# Patient Record
Sex: Female | Born: 1970
Health system: Southern US, Community
[De-identification: ages and names within clinical notes are randomized; demographics above are authoritative.]

## PROBLEM LIST (undated history)

## (undated) VITALS — BP 103/71 | HR 86 | Temp 97.6°F | Resp 20 | Ht 67.0 in | Wt 160.0 lb

## (undated) DIAGNOSIS — G43019 Migraine without aura, intractable, without status migrainosus: Secondary | ICD-10-CM

## (undated) DIAGNOSIS — F329 Major depressive disorder, single episode, unspecified: Secondary | ICD-10-CM

## (undated) DIAGNOSIS — H532 Diplopia: Secondary | ICD-10-CM

## (undated) DIAGNOSIS — G930 Cerebral cysts: Secondary | ICD-10-CM

## (undated) DIAGNOSIS — I700 Atherosclerosis of aorta: Secondary | ICD-10-CM

## (undated) DIAGNOSIS — Z8679 Personal history of other diseases of the circulatory system: Secondary | ICD-10-CM

## (undated) DIAGNOSIS — S82899A Other fracture of unspecified lower leg, initial encounter for closed fracture: Secondary | ICD-10-CM

## (undated) DIAGNOSIS — F1410 Cocaine abuse, uncomplicated: Secondary | ICD-10-CM

## (undated) DIAGNOSIS — Z87442 Personal history of urinary calculi: Secondary | ICD-10-CM

## (undated) DIAGNOSIS — K5909 Other constipation: Secondary | ICD-10-CM

## (undated) DIAGNOSIS — M25562 Pain in left knee: Secondary | ICD-10-CM

## (undated) DIAGNOSIS — E348 Other specified endocrine disorders: Secondary | ICD-10-CM

## (undated) DIAGNOSIS — F32A Depression, unspecified: Secondary | ICD-10-CM

## (undated) DIAGNOSIS — Z9889 Other specified postprocedural states: Secondary | ICD-10-CM

## (undated) DIAGNOSIS — F988 Other specified behavioral and emotional disorders with onset usually occurring in childhood and adolescence: Secondary | ICD-10-CM

## (undated) DIAGNOSIS — N200 Calculus of kidney: Secondary | ICD-10-CM

## (undated) DIAGNOSIS — N189 Chronic kidney disease, unspecified: Secondary | ICD-10-CM

## (undated) DIAGNOSIS — R112 Nausea with vomiting, unspecified: Secondary | ICD-10-CM

## (undated) DIAGNOSIS — G8929 Other chronic pain: Secondary | ICD-10-CM

## (undated) DIAGNOSIS — F419 Anxiety disorder, unspecified: Secondary | ICD-10-CM

## (undated) DIAGNOSIS — F1910 Other psychoactive substance abuse, uncomplicated: Secondary | ICD-10-CM

## (undated) HISTORY — DX: Pain in left knee: M25.562

## (undated) HISTORY — DX: Other chronic pain: G89.29

## (undated) HISTORY — PX: STONE EXTRACTION WITH BASKET: SHX5318

## (undated) HISTORY — PX: LITHOTRIPSY: SUR834

## (undated) HISTORY — DX: Cocaine abuse, uncomplicated: F14.10

## (undated) HISTORY — DX: Migraine without aura, intractable, without status migrainosus: G43.019

## (undated) HISTORY — DX: Other constipation: K59.09

## (undated) HISTORY — DX: Other specified behavioral and emotional disorders with onset usually occurring in childhood and adolescence: F98.8

## (undated) HISTORY — DX: Other specified endocrine disorders: E34.8

## (undated) HISTORY — DX: Diplopia: H53.2

## (undated) HISTORY — PX: CYSTOSCOPY W/ URETERAL STENT PLACEMENT: SHX1429

---

## 1998-02-14 ENCOUNTER — Emergency Department (HOSPITAL_COMMUNITY): Admission: EM | Admit: 1998-02-14 | Discharge: 1998-02-14 | Payer: Self-pay | Admitting: Emergency Medicine

## 2000-12-06 ENCOUNTER — Emergency Department (HOSPITAL_COMMUNITY): Admission: EM | Admit: 2000-12-06 | Discharge: 2000-12-06 | Payer: Self-pay | Admitting: *Deleted

## 2000-12-08 ENCOUNTER — Ambulatory Visit (HOSPITAL_COMMUNITY): Admission: RE | Admit: 2000-12-08 | Discharge: 2000-12-08 | Payer: Self-pay

## 2003-11-07 HISTORY — PX: ORIF ANKLE FRACTURE: SUR919

## 2004-01-29 ENCOUNTER — Inpatient Hospital Stay (HOSPITAL_COMMUNITY): Admission: AC | Admit: 2004-01-29 | Discharge: 2004-02-03 | Payer: Self-pay

## 2004-05-14 ENCOUNTER — Emergency Department (HOSPITAL_COMMUNITY): Admission: EM | Admit: 2004-05-14 | Discharge: 2004-05-14 | Payer: Self-pay | Admitting: Emergency Medicine

## 2004-05-16 DIAGNOSIS — S82899A Other fracture of unspecified lower leg, initial encounter for closed fracture: Secondary | ICD-10-CM

## 2004-05-16 HISTORY — DX: Other fracture of unspecified lower leg, initial encounter for closed fracture: S82.899A

## 2004-05-24 ENCOUNTER — Ambulatory Visit (HOSPITAL_BASED_OUTPATIENT_CLINIC_OR_DEPARTMENT_OTHER): Admission: RE | Admit: 2004-05-24 | Discharge: 2004-05-24 | Payer: Self-pay | Admitting: Orthopedic Surgery

## 2004-07-21 ENCOUNTER — Encounter: Admission: RE | Admit: 2004-07-21 | Discharge: 2004-09-01 | Payer: Self-pay | Admitting: Orthopedic Surgery

## 2005-02-06 ENCOUNTER — Ambulatory Visit (HOSPITAL_COMMUNITY): Payer: Self-pay | Admitting: Psychiatry

## 2005-02-22 ENCOUNTER — Ambulatory Visit (HOSPITAL_COMMUNITY): Payer: Self-pay | Admitting: Psychiatry

## 2005-03-10 ENCOUNTER — Ambulatory Visit (HOSPITAL_COMMUNITY): Payer: Self-pay | Admitting: Psychiatry

## 2005-04-05 ENCOUNTER — Ambulatory Visit: Payer: Self-pay | Admitting: Psychiatry

## 2005-04-05 ENCOUNTER — Ambulatory Visit (HOSPITAL_COMMUNITY): Payer: Self-pay | Admitting: Psychiatry

## 2005-05-03 ENCOUNTER — Ambulatory Visit (HOSPITAL_COMMUNITY): Payer: Self-pay | Admitting: Psychiatry

## 2005-05-22 ENCOUNTER — Ambulatory Visit (HOSPITAL_COMMUNITY): Payer: Self-pay | Admitting: Psychiatry

## 2005-06-19 ENCOUNTER — Ambulatory Visit (HOSPITAL_COMMUNITY): Payer: Self-pay | Admitting: Psychiatry

## 2005-07-14 ENCOUNTER — Ambulatory Visit (HOSPITAL_COMMUNITY): Payer: Self-pay | Admitting: Psychiatry

## 2005-07-24 ENCOUNTER — Encounter: Admission: RE | Admit: 2005-07-24 | Discharge: 2005-07-24 | Payer: Self-pay | Admitting: Neurology

## 2005-08-04 ENCOUNTER — Ambulatory Visit (HOSPITAL_COMMUNITY): Payer: Self-pay | Admitting: Psychiatry

## 2005-08-30 ENCOUNTER — Ambulatory Visit (HOSPITAL_COMMUNITY): Payer: Self-pay | Admitting: Psychiatry

## 2005-08-31 ENCOUNTER — Inpatient Hospital Stay (HOSPITAL_COMMUNITY): Admission: RE | Admit: 2005-08-31 | Discharge: 2005-09-04 | Payer: Self-pay | Admitting: Psychiatry

## 2005-08-31 ENCOUNTER — Ambulatory Visit: Payer: Self-pay | Admitting: Psychiatry

## 2005-09-06 ENCOUNTER — Ambulatory Visit (HOSPITAL_COMMUNITY): Payer: Self-pay | Admitting: Psychiatry

## 2005-09-25 ENCOUNTER — Ambulatory Visit (HOSPITAL_COMMUNITY): Payer: Self-pay | Admitting: Psychiatry

## 2005-10-09 ENCOUNTER — Ambulatory Visit (HOSPITAL_COMMUNITY): Payer: Self-pay | Admitting: Psychiatry

## 2005-10-09 ENCOUNTER — Ambulatory Visit: Payer: Self-pay | Admitting: Psychiatry

## 2005-10-20 ENCOUNTER — Ambulatory Visit (HOSPITAL_COMMUNITY): Payer: Self-pay | Admitting: Psychiatry

## 2005-11-08 ENCOUNTER — Ambulatory Visit (HOSPITAL_COMMUNITY): Payer: Self-pay | Admitting: Psychiatry

## 2005-12-13 ENCOUNTER — Ambulatory Visit (HOSPITAL_COMMUNITY): Payer: Self-pay | Admitting: Psychiatry

## 2006-01-01 ENCOUNTER — Ambulatory Visit (HOSPITAL_COMMUNITY): Payer: Self-pay | Admitting: Psychiatry

## 2006-01-29 ENCOUNTER — Ambulatory Visit (HOSPITAL_COMMUNITY): Payer: Self-pay | Admitting: Psychiatry

## 2006-02-26 ENCOUNTER — Ambulatory Visit (HOSPITAL_COMMUNITY): Payer: Self-pay | Admitting: Psychiatry

## 2006-03-20 ENCOUNTER — Ambulatory Visit (HOSPITAL_COMMUNITY): Payer: Self-pay | Admitting: Psychiatry

## 2006-04-17 ENCOUNTER — Ambulatory Visit (HOSPITAL_COMMUNITY): Payer: Self-pay | Admitting: Psychiatry

## 2006-06-25 ENCOUNTER — Ambulatory Visit (HOSPITAL_COMMUNITY): Payer: Self-pay | Admitting: Psychiatry

## 2006-08-29 ENCOUNTER — Ambulatory Visit (HOSPITAL_COMMUNITY): Payer: Self-pay | Admitting: Psychiatry

## 2006-11-21 ENCOUNTER — Ambulatory Visit (HOSPITAL_COMMUNITY): Payer: Self-pay | Admitting: Psychiatry

## 2006-12-26 ENCOUNTER — Ambulatory Visit (HOSPITAL_COMMUNITY): Payer: Self-pay | Admitting: Psychiatry

## 2007-01-24 ENCOUNTER — Ambulatory Visit (HOSPITAL_COMMUNITY): Payer: Self-pay | Admitting: Psychiatry

## 2007-02-26 ENCOUNTER — Ambulatory Visit (HOSPITAL_COMMUNITY): Payer: Self-pay | Admitting: Psychiatry

## 2007-03-20 ENCOUNTER — Ambulatory Visit (HOSPITAL_COMMUNITY): Payer: Self-pay | Admitting: Psychiatry

## 2007-04-24 ENCOUNTER — Ambulatory Visit (HOSPITAL_COMMUNITY): Payer: Self-pay | Admitting: Psychiatry

## 2007-05-17 ENCOUNTER — Ambulatory Visit (HOSPITAL_COMMUNITY): Payer: Self-pay | Admitting: Psychiatry

## 2007-05-30 ENCOUNTER — Ambulatory Visit (HOSPITAL_COMMUNITY): Payer: Self-pay | Admitting: Psychiatry

## 2007-06-19 ENCOUNTER — Ambulatory Visit (HOSPITAL_COMMUNITY): Payer: Self-pay | Admitting: Psychiatry

## 2007-07-01 ENCOUNTER — Ambulatory Visit: Payer: Self-pay | Admitting: Licensed Clinical Social Worker

## 2007-07-15 ENCOUNTER — Ambulatory Visit (HOSPITAL_COMMUNITY): Payer: Self-pay | Admitting: Psychiatry

## 2007-07-24 ENCOUNTER — Ambulatory Visit: Payer: Self-pay | Admitting: Licensed Clinical Social Worker

## 2007-08-14 ENCOUNTER — Ambulatory Visit (HOSPITAL_COMMUNITY): Payer: Self-pay | Admitting: Psychiatry

## 2007-08-26 ENCOUNTER — Ambulatory Visit: Payer: Self-pay | Admitting: Licensed Clinical Social Worker

## 2007-10-15 ENCOUNTER — Ambulatory Visit (HOSPITAL_COMMUNITY): Payer: Self-pay | Admitting: Psychiatry

## 2007-11-13 ENCOUNTER — Ambulatory Visit (HOSPITAL_COMMUNITY): Payer: Self-pay | Admitting: Psychiatry

## 2007-11-20 ENCOUNTER — Other Ambulatory Visit: Admission: RE | Admit: 2007-11-20 | Discharge: 2007-11-20 | Payer: Self-pay | Admitting: Family Medicine

## 2008-01-08 ENCOUNTER — Ambulatory Visit (HOSPITAL_COMMUNITY): Payer: Self-pay | Admitting: Psychiatry

## 2008-04-06 ENCOUNTER — Ambulatory Visit (HOSPITAL_COMMUNITY): Payer: Self-pay | Admitting: Psychiatry

## 2008-04-12 ENCOUNTER — Emergency Department (HOSPITAL_COMMUNITY): Admission: EM | Admit: 2008-04-12 | Discharge: 2008-04-12 | Payer: Self-pay | Admitting: Emergency Medicine

## 2008-04-14 ENCOUNTER — Ambulatory Visit (HOSPITAL_COMMUNITY): Admission: RE | Admit: 2008-04-14 | Discharge: 2008-04-14 | Payer: Self-pay | Admitting: Urology

## 2008-05-11 ENCOUNTER — Ambulatory Visit (HOSPITAL_COMMUNITY): Payer: Self-pay | Admitting: Psychiatry

## 2008-06-10 ENCOUNTER — Ambulatory Visit (HOSPITAL_COMMUNITY): Payer: Self-pay | Admitting: Psychiatry

## 2008-07-29 ENCOUNTER — Ambulatory Visit (HOSPITAL_COMMUNITY): Payer: Self-pay | Admitting: Psychiatry

## 2008-09-23 ENCOUNTER — Ambulatory Visit (HOSPITAL_COMMUNITY): Payer: Self-pay | Admitting: Psychiatry

## 2008-12-23 ENCOUNTER — Ambulatory Visit (HOSPITAL_COMMUNITY): Payer: Self-pay | Admitting: Psychiatry

## 2009-02-03 ENCOUNTER — Ambulatory Visit (HOSPITAL_COMMUNITY): Payer: Self-pay | Admitting: Psychiatry

## 2009-03-04 ENCOUNTER — Ambulatory Visit (HOSPITAL_COMMUNITY): Admission: AD | Admit: 2009-03-04 | Discharge: 2009-03-04 | Payer: Self-pay | Admitting: Urology

## 2009-03-11 ENCOUNTER — Ambulatory Visit (HOSPITAL_COMMUNITY): Admission: RE | Admit: 2009-03-11 | Discharge: 2009-03-11 | Payer: Self-pay | Admitting: Urology

## 2009-05-05 ENCOUNTER — Ambulatory Visit (HOSPITAL_COMMUNITY): Payer: Self-pay | Admitting: Psychiatry

## 2009-06-18 ENCOUNTER — Ambulatory Visit (HOSPITAL_COMMUNITY): Payer: Self-pay | Admitting: Psychiatry

## 2009-08-03 ENCOUNTER — Ambulatory Visit (HOSPITAL_COMMUNITY): Payer: Self-pay | Admitting: Psychiatry

## 2009-08-27 ENCOUNTER — Ambulatory Visit (HOSPITAL_COMMUNITY): Payer: Self-pay | Admitting: Psychiatry

## 2009-09-28 ENCOUNTER — Ambulatory Visit (HOSPITAL_COMMUNITY): Admission: RE | Admit: 2009-09-28 | Discharge: 2009-09-28 | Payer: Self-pay | Admitting: Urology

## 2009-09-28 ENCOUNTER — Emergency Department (HOSPITAL_COMMUNITY): Admission: EM | Admit: 2009-09-28 | Discharge: 2009-09-28 | Payer: Self-pay | Admitting: Emergency Medicine

## 2009-09-30 ENCOUNTER — Emergency Department (HOSPITAL_COMMUNITY): Admission: EM | Admit: 2009-09-30 | Discharge: 2009-09-30 | Payer: Self-pay | Admitting: Emergency Medicine

## 2009-10-20 ENCOUNTER — Ambulatory Visit (HOSPITAL_COMMUNITY): Payer: Self-pay | Admitting: Psychiatry

## 2009-12-14 ENCOUNTER — Ambulatory Visit (HOSPITAL_COMMUNITY): Payer: Self-pay | Admitting: Psychiatry

## 2010-02-10 ENCOUNTER — Observation Stay (HOSPITAL_COMMUNITY): Admission: EM | Admit: 2010-02-10 | Discharge: 2010-02-11 | Payer: Self-pay | Admitting: Emergency Medicine

## 2010-03-15 ENCOUNTER — Ambulatory Visit (HOSPITAL_COMMUNITY): Payer: Self-pay | Admitting: Psychiatry

## 2010-06-14 ENCOUNTER — Ambulatory Visit (HOSPITAL_COMMUNITY): Payer: Self-pay | Admitting: Psychiatry

## 2010-09-13 ENCOUNTER — Ambulatory Visit (HOSPITAL_COMMUNITY): Payer: Self-pay | Admitting: Psychiatry

## 2010-10-04 ENCOUNTER — Ambulatory Visit (HOSPITAL_COMMUNITY): Payer: Self-pay | Admitting: Psychiatry

## 2010-11-22 ENCOUNTER — Observation Stay (HOSPITAL_COMMUNITY)
Admission: EM | Admit: 2010-11-22 | Discharge: 2010-11-23 | Payer: Self-pay | Source: Home / Self Care | Attending: Urology | Admitting: Urology

## 2010-11-23 LAB — URINALYSIS, ROUTINE W REFLEX MICROSCOPIC
Bilirubin Urine: NEGATIVE
Nitrite: NEGATIVE
Protein, ur: 30 mg/dL — AB
Specific Gravity, Urine: 1.019 (ref 1.005–1.030)
Urine Glucose, Fasting: NEGATIVE mg/dL
Urobilinogen, UA: 0.2 mg/dL (ref 0.0–1.0)
pH: 6 (ref 5.0–8.0)

## 2010-11-23 LAB — URINE MICROSCOPIC-ADD ON

## 2010-11-23 LAB — POCT PREGNANCY, URINE: Preg Test, Ur: NEGATIVE

## 2010-11-28 LAB — URINE CULTURE
Colony Count: >=100000
Culture  Setup Time: 201201172144

## 2010-12-14 ENCOUNTER — Encounter (HOSPITAL_COMMUNITY): Payer: Self-pay | Admitting: Physician Assistant

## 2010-12-14 ENCOUNTER — Encounter (HOSPITAL_COMMUNITY): Payer: Medicare Other | Admitting: Physician Assistant

## 2010-12-14 DIAGNOSIS — F332 Major depressive disorder, recurrent severe without psychotic features: Secondary | ICD-10-CM

## 2011-01-11 ENCOUNTER — Encounter (HOSPITAL_COMMUNITY): Payer: Medicare Other | Admitting: Physician Assistant

## 2011-01-11 DIAGNOSIS — F988 Other specified behavioral and emotional disorders with onset usually occurring in childhood and adolescence: Secondary | ICD-10-CM

## 2011-01-11 DIAGNOSIS — F39 Unspecified mood [affective] disorder: Secondary | ICD-10-CM

## 2011-01-25 LAB — BASIC METABOLIC PANEL
BUN: 13 mg/dL (ref 6–23)
Creatinine, Ser: 1.26 mg/dL — ABNORMAL HIGH (ref 0.4–1.2)
GFR calc non Af Amer: 47 mL/min — ABNORMAL LOW (ref >60)
Glucose, Bld: 64 mg/dL — ABNORMAL LOW (ref 70–99)
Potassium: 3.3 mEq/L — ABNORMAL LOW (ref 3.5–5.1)

## 2011-01-25 LAB — DIFFERENTIAL
Basophils Absolute: 0 10*3/uL (ref 0.0–0.1)
Eosinophils Absolute: 0.2 10*3/uL (ref 0.0–0.7)
Eosinophils Relative: 2 % (ref 0–5)
Lymphocytes Relative: 19 % (ref 12–46)
Neutrophils Relative %: 72 % (ref 43–77)

## 2011-01-25 LAB — CBC
HCT: 37.1 % (ref 36.0–46.0)
Platelets: 219 10*3/uL (ref 150–400)
RDW: 15.3 % (ref 11.5–15.5)

## 2011-01-25 LAB — URINALYSIS, ROUTINE W REFLEX MICROSCOPIC
Bilirubin Urine: NEGATIVE
Ketones, ur: NEGATIVE mg/dL
Nitrite: NEGATIVE
Specific Gravity, Urine: 1.008 (ref 1.005–1.030)
Urobilinogen, UA: 0.2 mg/dL (ref 0.0–1.0)

## 2011-01-25 LAB — PREGNANCY, URINE: Preg Test, Ur: NEGATIVE

## 2011-02-07 NOTE — Discharge Summary (Signed)
  [name], Rebecca Andrade               ACCOUNT NO.:  [id number]  MEDICAL RECORD NO.:  [id number]          PATIENT TYPE:  INP  LOCATION:  1433                         FACILITY:  [facility]  PHYSICIAN:  Danae Chen, M.D.  DATE OF BIRTH:  [birth year]  DATE OF ADMISSION:  11/22/2010 DATE OF DISCHARGE:  11/23/2010                              DISCHARGE SUMMARY   DISCHARGE DIAGNOSIS:  Left ureteral stone, left hydronephrosis.  PROCEDURE:  Cystoscopy, left retrograde pyelogram, ureteroscopy, stone extraction and insertion of double-J stent on November 22, 2010.  HISTORY:  The patient is a 40 year old female who has a long history of kidney stone.  She came to the Emergency Room yesterday morning complaining of severe left flank and left lower quadrant pain.  A CT scan showed severe left hydronephrosis and a 5 x 8 mm left distal ureteral stone.  She was treated with analgesics and continued to have pain.  She had a cystoscopy, retrograde pyelogram, ureteroscopy, stone extraction, and double-J stent inserted on November 22, 2010.  On November 23, 2010, she was afebrile.  She had flank discomfort on urination that is secondary to reflux due to the stent.  She is voiding well.  She tolerates her diet well.  She was then discharged home on all her home medications and Percocet and Phenergan.  She will be followed as an outpatient for stent removal.  CONDITION ON DISCHARGE:  Improved.  DISCHARGE DIET:  Regular.  DISCHARGE ACTIVITIES:  She may resume all her prehospital activities.     Danae Chen, M.D.     MN/MEDQ  D:  11/23/2010  T:  11/23/2010  Job:  [id number]  Electronically Signed by Lindaann Slough M.D. on 12/09/2010 11:33:47 AM

## 2011-02-07 NOTE — Consult Note (Signed)
NAMEJESSIC, Rebecca Andrade               ACCOUNT NO.:  [id number]  MEDICAL RECORD NO.:  [id number]          PATIENT TYPE:  INP  LOCATION:  1433                         FACILITY:  [facility]  PHYSICIAN:  Danae Chen, M.D.  DATE OF BIRTH:  23-May-1971  DATE OF CONSULTATION:  11/22/2010 DATE OF DISCHARGE:                                CONSULTATION   REASON FOR CONSULTATION:  Left flank pain.  HISTORY OF PRESENT ILLNESS:  The patient is a 40 year old female who presented to the emergency room this morning with a 2 days history of left flank pain associated with nausea.  The pain got progressively worse and she was in severe pain this morning and came to the emergency room.  She has a past history of kidney stone.  The pain is associated with nausea and it radiates to the left lower quadrant.  She had stone manipulation done in the past.  A CT scan showed a severe left hydronephrosis secondary to a 5 x 8 mm left distal ureteral calculus. She also has 3 stones in the right kidney.  The stones on the right side are nonobstructing.  Because of the severity of the hydronephrosis and her symptoms, she was advised to have stone manipulation and/or insertion of double-J stent and she will be admitted for observation after the procedure.  PAST MEDICAL HISTORY:  Positive for anxiety, depression.  PAST SURGICAL HISTORY:  She had brain surgery in the past.  She had ESL and stone manipulation in the past.  MEDICATIONS:  Adderall, Pamelor, Topamax and trazodone.  ALLERGIES:  No known drug allergies.  FAMILY HISTORY:  Negative for diabetes, hypertension or kidney stone.  SOCIAL HISTORY:  She is married, has 1 son.  She does not smoke nor drink.  REVIEW OF SYSTEMS:  As noted in the HPI and everything else is negative.  PHYSICAL EXAMINATION:  GENERAL:  This is a well-developed 40 year old female who is complaining of severe left flank pain.  She is alert and oriented to time, place and person. VITAL  SIGNS:  Her blood pressure is 115/81, pulse 105, respirations 18, temperature 98.2. SKIN:  Warm and dry. HEENT:  Her head is normal.  She has pink conjunctivae.  Ears, nose and throat are within normal limits. NECK:  Supple.  She has no cervical adenopathy.  No thyromegaly. CHEST:  Symmetrical. LUNGS:  Fully expanded and clear to percussion and auscultation. HEART:  Regular rate. ABDOMEN:  Soft, tender in the left flank.  She has a left CVA tenderness.  Tenderness in the left lower quadrant.  She has no hepatomegaly, no splenomegaly.  Kidneys are not palpable.  Bladder is not distended.  She has no inguinal hernia.  No inguinal adenopathy. Bowel sounds are normal. GENITALIA:  She has normal female genitalia.  Her meatus is normal. Cervix is firm, in the midline, nontender. EXTREMITIES:  Within normal limits.  IMPRESSION: 1. Left distal ureteral calculus. 2. Severe left hydronephrosis. 3. Right renal calculi.  PLAN:  Cystoscopy, retrograde pyelogram, ureteroscopy with holmium laser of the ureteral stone and insertion of double-J stent.  The procedure, the risks and benefits were discussed  with the patient. She states that she had this procedure done in the past and she understands and is agreeable.     Danae Chen, M.D.     MN/MEDQ  D:  11/22/2010  T:  11/22/2010  Job:  [id number]  Electronically Signed by Lindaann Slough M.D. on 12/09/2010 11:35:26 AM

## 2011-02-07 NOTE — Op Note (Signed)
NAMEHAIDE, KLINKER NO.:  [id number]  MEDICAL RECORD NO.:  [id number]          PATIENT TYPE:  INP  LOCATION:  7893                         FACILITY:  [facility]  PHYSICIAN:  Danae Chen, M.D.  DATE OF BIRTH:  1971-07-14  DATE OF PROCEDURE:  11/22/2010 DATE OF DISCHARGE:                              OPERATIVE REPORT   PREOPERATIVE DIAGNOSES:  Left ureteral stone, severe left hydronephrosis.  POSTOPERATIVE DIAGNOSES:  Left ureteral stone, severe left hydronephrosis.  PROCEDURE:  Cystoscopy, left retrograde pyelogram, ureteroscopy, and insertion of double-J stent.  SURGEON:  Danae Chen, M.D.  ANESTHESIA:  General.  INDICATIONS:  The patient is 40 year old female, known history of kidney stones, who presented to the emergency room this morning with severe left flank pain radiating to the left lower quadrant.  CT scan showed a 5 x 8 mm stone in the left distal ureter with severe left hydronephrosis.  She continued to complain of pain.  She is scheduled now for cystoscopy and stone manipulation.  DESCRIPTION OF PROCEDURE:  The patient was identified by her wrist band and proper time-out was taken.  Under general anesthesia, she was prepped and draped and placed in the dorsal lithotomy position.  A panendoscope was inserted in the bladder. The bladder mucosa is normal.  There is a stone in the bladder.  The ureteral orifices are in normal position and shape.  Retrograde pyelogram: A cone-tip catheter was passed through the cystoscope through the left ureteral orifice and contrast was injected through the cone-tip catheter.  The ureter appears normal.  There is no evidence of hydronephrosis and there is no evidence of filling defect in the ureter.  The renal pelvis and collecting system appear mildly dilated.  The cone-tip catheter was then removed.  A sensor wire was passed through the cystoscope, the left ureter all the way into the renal pelvis.  The  cystoscope was removed.  A #6.5 French semi-rigid ureteroscope was then passed in the bladder and through the left ureteral orifice and advanced without difficulty all the way up into the renal pelvis.  There is no evidence of stone in the ureter. The ureteroscope was then removed.  The guidewire was back loaded into the cystoscope, and a #6-French - 24-double-J stent was passed over the guidewire with proximal and the distal curls of the double-J stent respectively in the renal pelvis and in the bladder.  The guidewire was then removed.  Then attempts were made to remove the stone in the bladder with the [facility] syringe, but were unsuccessful.  I then passed a nitinol basket through the cystoscope and extracted the stone.  The stone was fragmented and a small fragment was retrieved.  The bladder was then emptied and the cystoscope removed.  A string was left attached to the double-J stent for easy removal.  The patient tolerated the procedure well and left the OR in satisfactory condition to post anesthesia care unit.     Danae Chen, M.D.     MN/MEDQ  D:  11/22/2010  T:  11/22/2010  Job:  [id number]  Electronically Signed by Lindaann Slough M.D.  on 12/09/2010 11:38:11 AM

## 2011-02-08 LAB — DIFFERENTIAL
Basophils Absolute: 0.1 10*3/uL (ref 0.0–0.1)
Basophils Absolute: 0.1 10*3/uL (ref 0.0–0.1)
Basophils Relative: 1 % (ref 0–1)
Basophils Relative: 1 % (ref 0–1)
Eosinophils Relative: 2 % (ref 0–5)
Eosinophils Relative: 3 % (ref 0–5)
Lymphocytes Relative: 16 % (ref 12–46)
Monocytes Absolute: 0.7 10*3/uL (ref 0.1–1.0)
Monocytes Relative: 8 % (ref 3–12)

## 2011-02-08 LAB — COMPREHENSIVE METABOLIC PANEL
AST: 17 U/L (ref 0–37)
Albumin: 3.8 g/dL (ref 3.5–5.2)
Alkaline Phosphatase: 98 U/L (ref 39–117)
Chloride: 108 mEq/L (ref 96–112)
GFR calc Af Amer: >60 mL/min (ref >60)
Potassium: 3.2 mEq/L — ABNORMAL LOW (ref 3.5–5.1)
Sodium: 138 mEq/L (ref 135–145)
Total Bilirubin: 0.4 mg/dL (ref 0.3–1.2)
Total Protein: 7.4 g/dL (ref 6.0–8.3)

## 2011-02-08 LAB — BASIC METABOLIC PANEL
BUN: 8 mg/dL (ref 6–23)
Calcium: 9 mg/dL (ref 8.4–10.5)
GFR calc non Af Amer: >60 mL/min (ref >60)
Glucose, Bld: 103 mg/dL — ABNORMAL HIGH (ref 70–99)

## 2011-02-08 LAB — URINALYSIS, ROUTINE W REFLEX MICROSCOPIC
Bilirubin Urine: NEGATIVE
Ketones, ur: NEGATIVE mg/dL
Leukocytes, UA: NEGATIVE
Nitrite: NEGATIVE
Nitrite: NEGATIVE
Protein, ur: NEGATIVE mg/dL
Specific Gravity, Urine: 1.014 (ref 1.005–1.030)
Specific Gravity, Urine: 1.019 (ref 1.005–1.030)
Urobilinogen, UA: 0.2 mg/dL (ref 0.0–1.0)
Urobilinogen, UA: 1 mg/dL (ref 0.0–1.0)
pH: 6.5 (ref 5.0–8.0)
pH: 6.5 (ref 5.0–8.0)

## 2011-02-08 LAB — CBC
HCT: 32.1 % — ABNORMAL LOW (ref 36.0–46.0)
Platelets: 224 10*3/uL (ref 150–400)
Platelets: 237 10*3/uL (ref 150–400)
RDW: 12.8 % (ref 11.5–15.5)
WBC: 8.6 10*3/uL (ref 4.0–10.5)

## 2011-02-08 LAB — URINE CULTURE
Colony Count: NO GROWTH
Culture: NO GROWTH

## 2011-02-08 LAB — URINE MICROSCOPIC-ADD ON

## 2011-02-08 LAB — LIPASE, BLOOD: Lipase: 24 U/L (ref 11–59)

## 2011-03-21 NOTE — Op Note (Signed)
NAME:  Rebecca Andrade, Rebecca Andrade               ACCOUNT NO.:  [id number]   MEDICAL RECORD NO.:  [id number]          PATIENT TYPE:  AMB   LOCATION:  DAY                          [facility]ACILITY:  [facility]   PHYSICIAN:  Lindaann Slough, M.D.  DATE OF BIRTH:  11-25-1970   DATE OF PROCEDURE:  04/14/2008  DATE OF DISCHARGE:                               OPERATIVE REPORT   PREOPERATIVE DIAGNOSIS:  Left ureteral stone with hydronephrosis.   POSTOPERATIVE DIAGNOSIS:  Left ureteral stone with hydronephrosis.   PROCEDURES:  1. Cystoscopy.  2. Left retrograde pyelogram.  3. Ureteroscopy.  4. Holmium laser of left ureteral stone.  5. Stone extraction and insertion of double-J catheter.   SURGEON:  Danae Chen, M.D.   ANESTHESIA:  General.   INDICATIONS FOR PROCEDURE:  The patient is a 40 year old female who had  severe left flank pain radiating to the left lower quadrant associated  with nausea and vomiting two days ago.  She was seen in the emergency  room by the ER physician and a CT scan showed a 7 mm stone in the left  distal ureter with moderate hydronephrosis.  She was treated with  analgesics and sent home.  She was seen in the office yesterday  afternoon still complaining of severe left flank pain.  She is now  scheduled for cystoscopy and stone manipulation.  The procedure, the  risk and benefits were discussed with the patient and her mother.  They  understand the risks and are agreeable to proceed.   Under general anesthesia, the patient was prepped and draped and placed  in the dorsal lithotomy position.  A panendoscope was inserted in the  bladder.  The bladder mucosa is normal.  There is no stone or tumor in  the bladder.  The ureteral orifices are in normal position and shape.   Retrograde pyelogram:  A Glidewire was passed through an open-ended  catheter and passed through the left ureteral orifice and the Glidewire  was advanced up into the upper ureter and the open-ended catheter was  advanced over the Glidewire.  The Glidewire was then removed.  Contrast  was then injected through the open-ended catheter.  There is a filling  defect in the distal ureter and the mid and proximal ureter are dilated.  Contrast could not fill the renal pelvis.  A sensor tip guidewire was  then passed through the open-ended catheter and advanced in the ureter  and the open-ended catheter was removed.  The bladder was then emptied  and the cystoscope removed.  The guidewire was left in place as a safety  wire.   A 6.5 French semi-rigid ureteroscope was then passed in the bladder and  without difficulty through the left ureteral orifice.  There is a large  stone in the distal ureter.  With a 365 microfiber holmium laser, the  stone was fragmented in multiple smaller fragments.  Then the stone  fragments were removed with the nitinol basket.  There are some minor  stone fragments left in the ureter and she should easily pass those  stone fragments.   Second retrograde pyelogram:  Contrast was then injected through the  ureteroscope and there is no evidence of extravasation of contrast.  The  distal, mid and upper ureter appear dilated.  There is a kink in the  upper ureter but contrast could fill a dilated renal pelvis.  The  calyces are also dilated.  The ureteroscope was then removed.  The open-  ended catheter was then passed over the guidewire and the guidewire was  removed and replaced with a Glidewire.  It was difficult to maneuver the  Glidewire into the renal pelvis because of the kink of the upper ureter  but with patience and time, the Glidewire was advanced into the renal  pelvis and the open-ended catheter was advanced over the Glidewire into  the renal pelvis.  The Glidewire was then removed and replaced with the  sensor guidewire.  The open-ended catheter was then removed.  The  guidewire was then backloaded into the cystoscope and a #6-French - 24  double-J catheter was  passed over the guidewire.  The proximal curl of  the double-J catheter is in the collecting system.  The distal curl is  in the bladder.  The bladder was then emptied and the cystoscope and  guidewire removed.   The patient tolerated the procedure well and left the OR in satisfactory  condition to post anesthesia care unit.      Lindaann Slough, M.D.  Electronically Signed     MN/MEDQ  D:  04/14/2008  T:  04/14/2008  Job:  [id number]

## 2011-03-24 NOTE — Op Note (Signed)
NAME:  Rebecca Andrade, Rebecca Andrade                         ACCOUNT NO.:  [id number]   MEDICAL RECORD NO.:  [id number]                   PATIENT TYPE:  AMB   LOCATION:  DSC                                  FACILITY:  [facility]   PHYSICIAN:  Leonides Grills, M.D.                  DATE OF BIRTH:  1971/04/01   DATE OF PROCEDURE:  05/24/2004  DATE OF DISCHARGE:                                 OPERATIVE REPORT   PREOPERATIVE DIAGNOSES:  1. Left talar body fracture.  2. Left osteochondral lesion, talus.   POSTOPERATIVE DIAGNOSES:  1. Left talar body fracture.  2. Left osteochondral lesion, talus.   OPERATION:  1. Open reduction and internal fixation of left talar body fracture.  2. Partial excision, left talus.  3. Stress x-ray of the left ankle.   ANESTHESIA:  General endotracheal tube with popliteal block.   SURGEON:  Leonides Grills, M.D.   ASSISTANT:  Lianne Cure, P.A.   ESTIMATED BLOOD LOSS:  Minimal.   TOURNIQUET TIME:  Approximately an hour and a half.   COMPLICATIONS:  None.   DISPOSITION:  Stable to PAR.   INDICATIONS:  This is a 40 year old female who sustained the above injury.  She was consented for the above procedure.  All risks, which include  infection, nerve or vessel injury, nonunion, malunion, hardware rotation,  hardware failure, stiffness, arthritis of both the ankle and subtalar joint,  hardware rotation, hardware failure, avascular necrosis with possible  arthritis and collapse and future fusion, were all explained, questions were  encouraged and answered.   OPERATION:  The patient was brought to the operating room and placed in  supine position after adequate general endotracheal tube anesthesia was  administered with popliteal block as well as Ancef 1 g IV piggyback.  The  left lower extremity was then prepped and draped in a sterile manner over a  proximally-placed thigh tourniquet once the limb was internally rotated with  the patella pointing straight up.  The  left lower extremity was then prepped  and draped in a sterile manner over a proximally-placed thigh tourniquet.  The limb was gravity-exsanguinated and the tourniquet was elevated to 290  mmHg.  A longitudinal incision in an interval between the anterior tibialis  and the posterior tibial tendon heading toward the anterior distal corner of  the medial malleolus was then made.  Dissection was carried down through  skin.  Hemostasis was obtained.  Retinaculum was opened in line with the  incision.  Capsule was also opened anterior medially as well.  Deltoid  fibers were preserved.  The fracture site was debrided of hematoma as well  as any portions of debris in the subtalar joint was also debrided as well  once the fracture was opened.  Once this was opened up and all bony  fragments of talus as well as osteochondral lesions were removed, we then  made another approach on the  lateral side of the foot.  This was an Marketing executive.  Once the incision was then made, extensor digitorum brevis was  then elevated in a U-shape elevation sharply off the superior aspect of the  calcaneus as well as an anterior lateral capsulotomy was also made.  The  also osteochondral lesion of the talus, which was small on the anterolateral  corner of the talus, was removed for it was felt that it would be more of a  nuisance than actually helping in her healing.  The remaining fracture  fragments were also removed from the subtalar joint through the fracture  site using a synovectomy rongeur as well as copious irrigation.  We then  anatomically reduced the fracture and two 1.25 K-wires were then placed,  holding the fracture anatomically reduced.  These K-wires were placed on  both medial and lateral aspects of the talus, respectively.  X-rays were  obtained and showed an anatomic reduction.  We overdrilled the near cortex  and two 4.0 mm partially-threaded cancellous titanium DePuy partially-  threaded  cannulated screws were hen placed over the K-wires.  This had  excellent compression of the fracture site, and this was visualized in the  Canale, lateral, and AP planes.  Range of motion of the ankle was then done  and stress x-rays were done that showed no gross motion of the fracture at  the fracture site as well, and there was no visual gross motion at the  fracture site as well.  We elected not to put any more fixation at this  point.  The tourniquet was deflated.  Hemostasis was obtained.  The area was  copiously irrigated with normal saline.  The extensor digitorum brevis as  well as the capsule on both sides were closed with 3-0 Vicryl, the subcu was  closed with 3-0 Vicryl, the skin was closed with 4-0 nylon.  Sterile  dressing was applied.  A modified Jones dressing was applied.  The patient  was stable to the PAR.                                               Leonides Grills, M.D.    PB/MEDQ  D:  05/24/2004  T:  05/24/2004  Job:  [id number]
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
## 2011-03-24 NOTE — Discharge Summary (Signed)
NAMEJAYELYN, BARNO NO.:  [id number]   MEDICAL RECORD NO.:  [id number]          PATIENT TYPE:  IPS   LOCATION:  0507                          FACILITY:  [facility]   PHYSICIAN:  Anselm Jungling, MD  DATE OF BIRTH:  Nov 27, 1970   DATE OF ADMISSION:  08/31/2005  DATE OF DISCHARGE:  09/04/2005                                 DISCHARGE SUMMARY   IDENTIFYING DATA/REASON FOR ADMISSION:  This was the first [facility] admission and  first inpatient psychiatric hospitalization ever for Mabel, a 40 year old  separated white female who was admitted due to complaints of paranoia,  depression and anxiety.  She also complained of ideas of reference.  She  denied auditory hallucinations.  She came to [place] on a regimen of lithium,  Abilify, Remeron and trazodone, prescribed by Dr. Lolly Mustache.  Please refer to  the admission note for further details pertaining to the symptoms,  circumstances and history that led to her hospitalization.   INITIAL DIAGNOSTIC IMPRESSION:  She was given an initial AXIS I diagnosis of  schizoaffective disorder, depressed with psychotic features.   MEDICAL/LABORATORY:  There were no significant medical issues during this  brief inpatient psychiatric stay.  The patient was physically assessed by  the nurse practitioner at the onset of her treatment.   HOSPITAL COURSE:  The patient was continued on Remeron 15 mg q.h.s.,  trazodone 150 mg q.h.s. and Valium 5 mg t.i.d.  She was given a trial of  Geodon 40 mg b.i.d. to address psychotic and paranoid symptoms, which was  well-tolerated.  Abilify was reduced to 20 mg q.h.s. with anticipation of it  being tapered further later on.   On this regimen, the patient did well over her brief inpatient stay.  By the  fifth hospital day, she was describing no further auditory hallucinations or  paranoia and indicated that she felt ready for discharge.  Sleep and  appetite were stable and she appeared to be well-organized in  general.   AFTERCARE:  The patient was to follow up with Dr. Lolly Mustache on September 06, 2005  in our outpatient clinic.   DISCHARGE MEDICATIONS:  1.  Lithium 300 mg b.i.d.  2.  Remeron 15 mg q.h.s.  3.  Trazodone 150 mg q.h.s.  4.  Valium 5 mg t.i.d.  5.  Abilify 20 mg q.h.s.  6.  Geodon 40 mg b.i.d.   DISCHARGE DIAGNOSES:  AXIS I:  Schizoaffective disorder, most recently  depressed with psychotic features, resolving.  AXIS II:  Deferred.  AXIS III:  No acute or chronic illnesses.  AXIS IV:  Stressors:  Severe.  AXIS V:  GAF on discharge 70.           ______________________________  Anselm Jungling, MD  Electronically Signed     SPB/MEDQ  D:  09/08/2005  T:  09/08/2005  Job:  [id number]

## 2011-04-13 ENCOUNTER — Encounter (HOSPITAL_COMMUNITY): Payer: Medicare Other | Admitting: Physician Assistant

## 2011-04-13 DIAGNOSIS — F39 Unspecified mood [affective] disorder: Secondary | ICD-10-CM

## 2011-04-13 DIAGNOSIS — F988 Other specified behavioral and emotional disorders with onset usually occurring in childhood and adolescence: Secondary | ICD-10-CM

## 2011-05-31 ENCOUNTER — Encounter (HOSPITAL_COMMUNITY): Payer: Medicare Other | Admitting: Physician Assistant

## 2011-05-31 DIAGNOSIS — F332 Major depressive disorder, recurrent severe without psychotic features: Secondary | ICD-10-CM

## 2011-08-02 ENCOUNTER — Encounter (HOSPITAL_COMMUNITY): Payer: Medicare Other | Admitting: Physician Assistant

## 2011-08-03 ENCOUNTER — Encounter (HOSPITAL_COMMUNITY): Payer: Medicare Other | Admitting: Physician Assistant

## 2011-08-03 LAB — URINE CULTURE
Colony Count: NO GROWTH
Culture: NO GROWTH
Special Requests: NEGATIVE

## 2011-08-03 LAB — HEMOGLOBIN AND HEMATOCRIT, BLOOD
HCT: 33.4 — ABNORMAL LOW
Hemoglobin: 11.4 — ABNORMAL LOW

## 2011-08-03 LAB — URINE MICROSCOPIC-ADD ON

## 2011-08-03 LAB — CBC
HCT: 38.6
MCV: 88.1
RBC: 4.38
WBC: 10.9 — ABNORMAL HIGH

## 2011-08-03 LAB — DIFFERENTIAL
Eosinophils Absolute: 0.2
Eosinophils Relative: 2
Lymphocytes Relative: 25
Lymphs Abs: 2.7
Monocytes Relative: 6
Neutrophils Relative %: 67

## 2011-08-03 LAB — URINALYSIS, ROUTINE W REFLEX MICROSCOPIC
Glucose, UA: NEGATIVE
Specific Gravity, Urine: 1.024
pH: 6

## 2011-08-03 LAB — POCT I-STAT, CHEM 8
BUN: 12
Creatinine, Ser: 1.3 — ABNORMAL HIGH
Glucose, Bld: 116 — ABNORMAL HIGH
Hemoglobin: 13.6
Potassium: 3.6

## 2011-08-03 LAB — PREGNANCY, URINE: Preg Test, Ur: NEGATIVE

## 2011-08-09 ENCOUNTER — Encounter (HOSPITAL_COMMUNITY): Payer: Medicare Other | Admitting: Physician Assistant

## 2011-08-16 ENCOUNTER — Encounter (INDEPENDENT_AMBULATORY_CARE_PROVIDER_SITE_OTHER): Payer: Medicare Other | Admitting: Physician Assistant

## 2011-08-16 DIAGNOSIS — F988 Other specified behavioral and emotional disorders with onset usually occurring in childhood and adolescence: Secondary | ICD-10-CM

## 2011-08-16 DIAGNOSIS — F332 Major depressive disorder, recurrent severe without psychotic features: Secondary | ICD-10-CM

## 2011-09-15 ENCOUNTER — Encounter (HOSPITAL_COMMUNITY): Payer: Medicare Other | Admitting: Psychology

## 2011-09-21 ENCOUNTER — Ambulatory Visit (HOSPITAL_COMMUNITY): Payer: Medicare Other | Admitting: Psychology

## 2011-10-02 ENCOUNTER — Other Ambulatory Visit (HOSPITAL_COMMUNITY): Payer: Self-pay | Admitting: Physician Assistant

## 2011-10-02 DIAGNOSIS — F332 Major depressive disorder, recurrent severe without psychotic features: Secondary | ICD-10-CM

## 2011-10-02 MED ORDER — DESVENLAFAXINE SUCCINATE ER 100 MG PO TB24: ORAL_TABLET | ORAL | Status: DC

## 2011-10-04 ENCOUNTER — Ambulatory Visit (INDEPENDENT_AMBULATORY_CARE_PROVIDER_SITE_OTHER): Payer: Medicare Other | Admitting: Physician Assistant

## 2011-10-04 DIAGNOSIS — F988 Other specified behavioral and emotional disorders with onset usually occurring in childhood and adolescence: Secondary | ICD-10-CM

## 2011-10-04 DIAGNOSIS — F332 Major depressive disorder, recurrent severe without psychotic features: Secondary | ICD-10-CM

## 2011-10-04 MED ORDER — ARIPIPRAZOLE 10 MG PO TABS: 10.0000 mg | ORAL_TABLET | Freq: Every day | ORAL | Status: DC

## 2011-10-04 MED ORDER — AMPHETAMINE-DEXTROAMPHET ER 25 MG PO CP24: ORAL_CAPSULE | ORAL | Status: DC

## 2011-10-05 NOTE — Progress Notes (Signed)
   [facility] Follow-up Outpatient Visit  Rebecca Andrade 08/23/1971  Date: 10/04/11   Subjective: Rebecca Andrade presents complaining that she continues to be depressed. Her sleep pattern is poor - she gets up at 6 AM to see her son off to school, then goes back to bed and sleeps until about 4 PM. She then stays up until about midnight and sleeps poorly until 6 AM. She denies any suicidal or homicidal ideation. She denies any auditory or visual hallucinations. Her appetite is poor. She feels that the Abilify may have had some effect on her depression. She has been taking the Adderall on a twice a day dosing regimen.  There were no vitals filed for this visit.  Mental Status Examination  Appearance: Well groomed and casually dressed Alert: Yes Attention: fair  Cooperative: Yes Eye Contact: Good Speech: Clear and even Psychomotor Activity: Decreased Memory/Concentration: Intact Oriented: person, place, time/date and situation Mood: Depressed and Hopeless Affect: Restricted Thought Processes and Associations: Logical Fund of Knowledge: Fair Thought Content:  Insight: Poor Judgement: Fair  Diagnosis: Depressive disorder recurrent severe, ADHD inattentive type.  Treatment Plan: We will increase her Abilify to 10 mg daily. And we will dose her Adderall so that she takes both capsules in the morning. She will followup in one month.  Hasina Kreager, PA

## 2011-10-30 ENCOUNTER — Other Ambulatory Visit (HOSPITAL_COMMUNITY): Payer: Self-pay | Admitting: Physician Assistant

## 2011-10-30 DIAGNOSIS — F332 Major depressive disorder, recurrent severe without psychotic features: Secondary | ICD-10-CM

## 2011-11-06 ENCOUNTER — Ambulatory Visit (INDEPENDENT_AMBULATORY_CARE_PROVIDER_SITE_OTHER): Payer: Medicare Other | Admitting: Physician Assistant

## 2011-11-06 DIAGNOSIS — F332 Major depressive disorder, recurrent severe without psychotic features: Secondary | ICD-10-CM

## 2011-11-06 DIAGNOSIS — F988 Other specified behavioral and emotional disorders with onset usually occurring in childhood and adolescence: Secondary | ICD-10-CM

## 2011-11-06 MED ORDER — DESVENLAFAXINE SUCCINATE ER 100 MG PO TB24: ORAL_TABLET | ORAL | Status: DC

## 2011-11-06 MED ORDER — AMPHETAMINE-DEXTROAMPHET ER 25 MG PO CP24: ORAL_CAPSULE | ORAL | Status: DC

## 2011-11-06 MED ORDER — TRAZODONE HCL 100 MG PO TABS: 200.0000 mg | ORAL_TABLET | Freq: Every day | ORAL | Status: DC

## 2011-11-06 MED ORDER — ARIPIPRAZOLE 10 MG PO TABS: 10.0000 mg | ORAL_TABLET | Freq: Every day | ORAL | Status: DC

## 2011-11-06 MED ORDER — TOPIRAMATE 200 MG PO TABS: 200.0000 mg | ORAL_TABLET | Freq: Two times a day (BID) | ORAL | Status: DC

## 2011-11-06 NOTE — Progress Notes (Signed)
   [facility] Follow-up Outpatient Visit  Nalaysia S Paschal 1971-03-12  Date: 11/06/11  Subjective: Rebecca Andrade presents today for a followup visit. She feels that the increased dose of Abilify may be of slight benefit. She reports that she has improved. Her sleep pattern in that she tries to stay up during the day, which helps her sleep better at night. She continues to stay up until 11 or 11:30 and still wakes up at 6 AM. She has not increased her exercise level at all, but she does walk the dog once daily for 15-20 minutes. She denies any suicidal or homicidal ideation. She denies any auditory or visual hallucinations. She reports that her New Year's resolution is to lose 15 pounds by spring, and to manage her finances better. She missed her first scheduled appointment with Shonna Chock last month.  There were no vitals filed for this visit.  Mental Status Examination  Appearance: Dressed in sweat clothes with her hair tucked up under a half, no makeup, and very lethargic, appearing Alert: Yes Attention: good  Cooperative: Yes Eye Contact: Good Speech: Clear and even Psychomotor Activity: Decreased Memory/Concentration: Intact Oriented: person, place, time/date and situation Mood: Depressed Affect: Blunted Thought Processes and Associations: Logical Fund of Knowledge: Fair Thought Content:  Insight: Fair Judgement: Fair  Diagnosis: Maj. depressive disorder, recurrent, severe, ADHD, inattentive type  Treatment Plan: We will continue her medications as prescribed. I have asked her to commit to calling to friends before she returns for followup in one month. I've also asked her to increase her exercise to walking the dog twice a day or finding some other form of exercise she may enjoy. She will reschedule an appointment to see Clinton Quant, PA

## 2011-12-05 ENCOUNTER — Ambulatory Visit (HOSPITAL_COMMUNITY): Payer: Medicare Other | Admitting: Psychology

## 2011-12-07 ENCOUNTER — Ambulatory Visit (INDEPENDENT_AMBULATORY_CARE_PROVIDER_SITE_OTHER): Payer: Medicare Other | Admitting: Physician Assistant

## 2011-12-07 DIAGNOSIS — F988 Other specified behavioral and emotional disorders with onset usually occurring in childhood and adolescence: Secondary | ICD-10-CM | POA: Diagnosis not present

## 2011-12-07 MED ORDER — AMPHETAMINE-DEXTROAMPHET ER 25 MG PO CP24: 50.0000 mg | ORAL_CAPSULE | ORAL | Status: DC

## 2011-12-07 NOTE — Progress Notes (Signed)
   [facility] Follow-up Outpatient Visit  Brion S Paschal [birth year]  Date: 12/07/11   Subjective: Rebecca Andrade presents today to followup on her medications for depression and ADHD. She reports that she canceled her appointment with her therapist because she had diarrhea and vomiting. When asked about her mood she states "I'm all right." She endorses suicidal thoughts and denies any intent, and also reports that her son is her deterrent. She reports that her sleep has improved. She goes to bed around 11 PM, then it takes about 45 minutes for her to fall asleep. She gets up at 6:15 AM and stays up through the day. She denies any homicidal ideation she denies any auditory or visual hallucinations.  There were no vitals filed for this visit.  Mental Status Examination  Appearance: Well groomed and casually dressed Alert: Yes Attention: fair  Cooperative: Yes Eye Contact: Fair Speech: Soft and low Psychomotor Activity: Decreased Memory/Concentration: Intact Oriented: person, place, time/date and situation Mood: Depressed Affect: Flat Thought Processes and Associations: Circumstantial and Coherent Fund of Knowledge: Fair Thought Content: Suicidal ideation Insight: Fair Judgement: Good  Diagnosis: Maj. depressive disorder, recurrent severe; ADHD, inattentive type.  Treatment Plan: Continue medications as prescribed and followup in 3 months  Izabelle Daus, PA

## 2011-12-20 ENCOUNTER — Ambulatory Visit (HOSPITAL_COMMUNITY): Payer: Medicare Other | Admitting: Anesthesiology

## 2011-12-20 ENCOUNTER — Encounter (HOSPITAL_COMMUNITY)
Admission: AD | Disposition: A | Discharge status: Home / Self Care | Payer: Self-pay | Source: Ambulatory Visit | Attending: Urology

## 2011-12-20 ENCOUNTER — Encounter (HOSPITAL_COMMUNITY): Payer: Self-pay | Admitting: *Deleted

## 2011-12-20 ENCOUNTER — Other Ambulatory Visit: Payer: Self-pay | Admitting: Urology

## 2011-12-20 ENCOUNTER — Encounter (HOSPITAL_COMMUNITY): Payer: Self-pay | Admitting: Anesthesiology

## 2011-12-20 ENCOUNTER — Ambulatory Visit (HOSPITAL_COMMUNITY)
Admission: AD | Admit: 2011-12-20 | Discharge: 2011-12-20 | Disposition: A | Discharge status: Home / Self Care | Payer: Medicare Other | Source: Ambulatory Visit | Attending: Urology | Admitting: Urology

## 2011-12-20 DIAGNOSIS — M545 Low back pain, unspecified: Secondary | ICD-10-CM | POA: Diagnosis not present

## 2011-12-20 DIAGNOSIS — Z79899 Other long term (current) drug therapy: Secondary | ICD-10-CM | POA: Diagnosis not present

## 2011-12-20 DIAGNOSIS — R109 Unspecified abdominal pain: Secondary | ICD-10-CM | POA: Diagnosis not present

## 2011-12-20 DIAGNOSIS — N390 Urinary tract infection, site not specified: Secondary | ICD-10-CM | POA: Diagnosis not present

## 2011-12-20 DIAGNOSIS — N201 Calculus of ureter: Secondary | ICD-10-CM | POA: Insufficient documentation

## 2011-12-20 DIAGNOSIS — N200 Calculus of kidney: Secondary | ICD-10-CM | POA: Diagnosis not present

## 2011-12-20 DIAGNOSIS — N1330 Unspecified hydronephrosis: Secondary | ICD-10-CM | POA: Diagnosis not present

## 2011-12-20 DIAGNOSIS — R82998 Other abnormal findings in urine: Secondary | ICD-10-CM | POA: Diagnosis not present

## 2011-12-20 DIAGNOSIS — N139 Obstructive and reflux uropathy, unspecified: Secondary | ICD-10-CM | POA: Diagnosis not present

## 2011-12-20 HISTORY — DX: Calculus of kidney: N20.0

## 2011-12-20 HISTORY — PX: CYSTOSCOPY W/ URETERAL STENT PLACEMENT: SHX1429

## 2011-12-20 HISTORY — DX: Depression, unspecified: F32.A

## 2011-12-20 HISTORY — DX: Major depressive disorder, single episode, unspecified: F32.9

## 2011-12-20 HISTORY — DX: Chronic kidney disease, unspecified: N18.9

## 2011-12-20 HISTORY — DX: Anxiety disorder, unspecified: F41.9

## 2011-12-20 LAB — SURGICAL PCR SCREEN
MRSA, PCR: NEGATIVE
Staphylococcus aureus: NEGATIVE

## 2011-12-20 SURGERY — CYSTOSCOPY, WITH RETROGRADE PYELOGRAM AND URETERAL STENT INSERTION
Anesthesia: General | Site: Ureter | Laterality: Left | Wound class: Clean Contaminated

## 2011-12-20 MED ORDER — PROMETHAZINE HCL 25 MG/ML IJ SOLN: 6.2500 mg | INTRAMUSCULAR | Status: DC | PRN

## 2011-12-20 MED ORDER — LIDOCAINE HCL 2 % EX GEL
CUTANEOUS | Status: AC
  Filled 2011-12-20: qty 10

## 2011-12-20 MED ORDER — CIPROFLOXACIN HCL 250 MG PO TABS: ORAL_TABLET | ORAL | Status: DC

## 2011-12-20 MED ORDER — CIPROFLOXACIN IN D5W 400 MG/200ML IV SOLN
INTRAVENOUS | Status: AC
  Filled 2011-12-20: qty 200

## 2011-12-20 MED ORDER — DEXAMETHASONE SODIUM PHOSPHATE 4 MG/ML IJ SOLN
INTRAMUSCULAR | Status: DC | PRN
  Administered 2011-12-20: 10 mg via INTRAVENOUS

## 2011-12-20 MED ORDER — LACTATED RINGERS IV SOLN
INTRAVENOUS | Status: DC
  Administered 2011-12-20: 19:00:00 via INTRAVENOUS

## 2011-12-20 MED ORDER — ONDANSETRON HCL 4 MG/2ML IJ SOLN
INTRAMUSCULAR | Status: DC | PRN
  Administered 2011-12-20: 4 mg via INTRAVENOUS

## 2011-12-20 MED ORDER — FENTANYL CITRATE 0.05 MG/ML IJ SOLN: 25.0000 ug | INTRAMUSCULAR | Status: DC | PRN

## 2011-12-20 MED ORDER — PROMETHAZINE HCL 12.5 MG PO TABS: 12.5000 mg | ORAL_TABLET | Freq: Four times a day (QID) | ORAL | Status: DC | PRN

## 2011-12-20 MED ORDER — OXYCODONE-ACETAMINOPHEN 5-325 MG PO TABS: 1.0000 | ORAL_TABLET | ORAL | Status: AC | PRN

## 2011-12-20 MED ORDER — IOHEXOL 300 MG/ML  SOLN
INTRAMUSCULAR | Status: AC
  Filled 2011-12-20: qty 1

## 2011-12-20 MED ORDER — LACTATED RINGERS IV SOLN
INTRAVENOUS | Status: DC | PRN
  Administered 2011-12-20: 18:00:00 via INTRAVENOUS

## 2011-12-20 MED ORDER — IOHEXOL 300 MG/ML  SOLN
INTRAMUSCULAR | Status: DC | PRN
  Administered 2011-12-20: 4 mL via INTRAVENOUS

## 2011-12-20 MED ORDER — MUPIROCIN 2 % EX OINT
TOPICAL_OINTMENT | CUTANEOUS | Status: AC
  Filled 2011-12-20: qty 22

## 2011-12-20 MED ORDER — ACETAMINOPHEN 10 MG/ML IV SOLN
INTRAVENOUS | Status: AC
  Filled 2011-12-20: qty 100

## 2011-12-20 MED ORDER — PROPOFOL 10 MG/ML IV EMUL
INTRAVENOUS | Status: DC | PRN
  Administered 2011-12-20: 200 mg via INTRAVENOUS

## 2011-12-20 MED ORDER — LIDOCAINE HCL (CARDIAC) 20 MG/ML IV SOLN
INTRAVENOUS | Status: DC | PRN
  Administered 2011-12-20: 100 mg via INTRAVENOUS

## 2011-12-20 MED ORDER — INDIGOTINDISULFONATE SODIUM 8 MG/ML IJ SOLN
INTRAMUSCULAR | Status: AC
  Filled 2011-12-20: qty 5

## 2011-12-20 MED ORDER — ACETAMINOPHEN 10 MG/ML IV SOLN
INTRAVENOUS | Status: DC | PRN
  Administered 2011-12-20: 1000 mg via INTRAVENOUS

## 2011-12-20 MED ORDER — CIPROFLOXACIN IN D5W 400 MG/200ML IV SOLN
400.0000 mg | INTRAVENOUS | Status: AC
  Administered 2011-12-20: 400 mg via INTRAVENOUS

## 2011-12-20 MED ORDER — FENTANYL CITRATE 0.05 MG/ML IJ SOLN
INTRAMUSCULAR | Status: DC | PRN
  Administered 2011-12-20 (×2): 50 ug via INTRAVENOUS

## 2011-12-20 SURGICAL SUPPLY — 14 items
ADAPTER CATH URET PLST 4-6FR (CATHETERS) ×2 IMPLANT
ADPR CATH URET STRL DISP 4-6FR (CATHETERS) ×1
BAG URO CATCHER STRL LF (DRAPE) ×2 IMPLANT
CATH INTERMIT  6FR 70CM (CATHETERS) ×2 IMPLANT
CLOTH BEACON ORANGE TIMEOUT ST (SAFETY) ×2 IMPLANT
DRAPE CAMERA CLOSED 9X96 (DRAPES) ×2 IMPLANT
GLOVE BIOGEL M STRL SZ7.5 (GLOVE) ×2 IMPLANT
GOWN STRL NON-REIN LRG LVL3 (GOWN DISPOSABLE) ×2 IMPLANT
GOWN STRL REIN XL XLG (GOWN DISPOSABLE) ×2 IMPLANT
GUIDEWIRE STR DUAL SENSOR (WIRE) ×2 IMPLANT
MANIFOLD NEPTUNE II (INSTRUMENTS) ×2 IMPLANT
PACK CYSTO (CUSTOM PROCEDURE TRAY) ×2 IMPLANT
STENT CONTOUR URETERAL (STENTS) ×1 IMPLANT
TUBING CONNECTING 10 (TUBING) IMPLANT

## 2011-12-20 NOTE — Transfer of Care (Signed)
Immediate Anesthesia Transfer of Care Note  Patient: Rebecca Andrade  Procedure(s) Performed: Procedure(s) (LRB): CYSTOSCOPY WITH RETROGRADE PYELOGRAM/URETERAL STENT PLACEMENT (Left)  Patient Location: PACU  Anesthesia Type: General  Level of Consciousness: sedated  Airway & Oxygen Therapy: Patient Spontanous Breathing and Patient connected to face mask oxygen  Post-op Assessment: Report given to PACU RN and Post -op Vital signs reviewed and stable  Post vital signs: Reviewed and stable  Complications: No apparent anesthesia complications

## 2011-12-20 NOTE — H&P (Signed)
History of Present Illness    41 y/o white female with h/o nephrolithiasis presents for acute evaluation of left low back/flank pain and LUTS.  She reports acute onset of left low back pain, nausea, dysuria, increased frequency, urgency and surpapubic pressure 12/19/11.  Her sxs have persisted and progressively worsened.  She denies gross hematuria, fever or chills.  Renal U/S 07/2011 demonstrated a LLP stone. She has not passed any stone since we saw her last.   Past Medical History Problems  1. History of  Anxiety (Symptom) 300.00 2. History of  Depression 311 3. History of  Heartburn 787.1  Surgical History Problems  1. History of  Brain Surgery 2. History of  Cystoscopy With Insertion Of Ureteral Stent Left 3. History of  Cystoscopy With Insertion Of Ureteral Stent Left 4. History of  Cystoscopy With Insertion Of Ureteral Stent Left 5. History of  Cystoscopy With Ureteroscopy Left 6. History of  Cystoscopy With Ureteroscopy With Lithotripsy 7. History of  Cystoscopy With Ureteroscopy With Lithotripsy 8. History of  Lithotripsy  Current Meds 1. Adderall TABS; Therapy: (Recorded:08Jun2009) to 2. BuPROPion HCl ER (SR) 100 MG Oral Tablet Extended Release 12 Hour; Therapy: 22Jun2012 to 3. Pamelor CAPS; Therapy: (Recorded:08Jun2009) to 4. Phenergan TABS; Therapy: (Recorded:08Jun2009) to 5. Pristiq 100 MG Oral Tablet Extended Release 24 Hour; Therapy: 03Mar2011 to 6. Topiramate 200 MG Oral Tablet; Therapy: 15Mar2011 to 7. TraZODone HCl TABS; Therapy: (Recorded:08Jun2009) to  Allergies Medication  1. No Known Drug Allergies  Family History Problems  1. Family history of  Family Health Status - Father's Age 71yrs 2. Family history of  Family Health Status - Mother's Age 73yrs 3. Family history of  Family Health Status Number Of Children 1 son  Social History Problems  1. Caffeine Use 2 q wk 2. Marital History - Single 3. Never A Smoker 4. Occupation: disabled Denied  5.  History of  Alcohol Use  Review of Systems Genitourinary, constitutional, skin, eye, otolaryngeal, hematologic/lymphatic, cardiovascular, pulmonary, endocrine, musculoskeletal, gastrointestinal, neurological and psychiatric system(s) were reviewed and pertinent findings if present are noted.  Genitourinary: urinary frequency, urinary urgency, dysuria, incomplete emptying of bladder and suprapubic pain.  Gastrointestinal: nausea, vomiting, flank pain and abdominal pain.    Vitals Vital Signs [Data Includes: Last 1 Day]  13Feb2013 02:03PM  Blood Pressure: 121 / 82 Temperature: 97.8 F Heart Rate: 91  Physical Exam Constitutional: Well nourished and well developed . No acute distress.  ENT:. The ears and nose are normal in appearance.  Neck: The appearance of the neck is normal and no neck mass is present.  Pulmonary: No respiratory distress and normal respiratory rhythm and effort.  Cardiovascular:. No peripheral edema.  Abdomen: The abdomen is soft and nontender. moderate left CVA tenderness.  Skin: Normal skin turgor, no visible rash and no visible skin lesions.  Neuro/Psych:. Mood and affect are appropriate.    Results/Data Urine [Data Includes: Last 1 Day]   13Feb2013  COLOR YELLOW   APPEARANCE CLOUDY   SPECIFIC GRAVITY 1.020   pH 6.0   GLUCOSE NEG mg/dL  BILIRUBIN NEG   KETONE NEG mg/dL  BLOOD LARGE   PROTEIN 30 mg/dL  UROBILINOGEN 0.2 mg/dL  NITRITE NEG   LEUKOCYTE ESTERASE SMALL   SQUAMOUS EPITHELIAL/HPF FEW   WBC TNTC WBC/hpf  RBC TNTC RBC/hpf  BACTERIA FEW   CRYSTALS Calcium Oxalate crystals noted   CASTS NONE SEEN    The following images/tracing/specimen were independently visualized: .  CTU demonstrates moderate-severe left hydroureteronephrosis due  to an 8mm left mid-distal ureteral stone.  There are nonobstructing right renal calculi as well but no other left renal stones.  The following clinical lab reports were reviewed: [name]  Urinalysis Selected Results  UA  With REFLEX 13Feb2013 01:35PM Bruning, Ashlyn   Test Name Result Flag Reference  COLOR YELLOW  YELLOW  APPEARANCE CLOUDY A CLEAR  SPECIFIC GRAVITY 1.020  1.005-1.030  pH 6.0  5.0-8.0  GLUCOSE NEG mg/dL  NEG  BILIRUBIN NEG  NEG  KETONE NEG mg/dL  NEG  BLOOD LARGE A NEG  PROTEIN 30 mg/dL A NEG  UROBILINOGEN 0.2 mg/dL  1.6-1.0  NITRITE NEG  NEG  LEUKOCYTE ESTERASE SMALL A NEG  SQUAMOUS EPITHELIAL/HPF FEW  RARE  WBC TNTC WBC/hpf A <4  RBC TNTC RBC/hpf A <4  BACTERIA FEW A RARE  Calcium Oxalate crystals noted  CASTS NONE SEEN  NONE SEEN   Assessment Assessed  1. Pyuria 791.9 2. Lower Back Pain 724.2 3. Hydronephrosis 591 4. Mid Ureteral Stone On The Left 592.1  Plan:  I discussed with the patient the findings on CT scan. We discussed the nature, risks benefits and alternatives to cystoscopy with stent placement. She has had no fever or chills and no tachycardia or fever, but her UA has some bacteria, wbc's and LE. Her last 2 UA's in the office with similar appearance have grown e coli pan sensitive. Therefore I am hesitant to perform ureteroscopy. We discussed continued stone passage, but she wants to proceed with stent placement. We discussed I would not be treating the stone tonight, only the obstruction and she would need a staged procedure to treat the stone in the future. We discussed future URS/laser litho or ESWL. She said he has had ESWL and would like to proceed with ESWL. We also discussed the likelihood of achieving the goals of the procedure and potential problems that might occur during the procedure or recuperation. All questions answered. The patient elects to proceed with cystoscopy and stent placement.

## 2011-12-20 NOTE — Preoperative (Signed)
Beta Blockers   Reason not to administer Beta Blockers:Not Applicable 

## 2011-12-20 NOTE — Anesthesia Preprocedure Evaluation (Signed)
Anesthesia Evaluation  Patient identified by MRN, date of birth, ID band Patient awake    Reviewed: Allergy & Precautions, H&P , NPO status , Patient's Chart, lab work & pertinent test results  Airway Mallampati: III TM Distance: >3 FB and <3 FB Neck ROM: full  Mouth opening: Limited Mouth Opening  Dental No notable dental hx. (+) Loose, Dental Advidsory Given and Missing   Pulmonary neg pulmonary ROS,  clear to auscultation  Pulmonary exam normal       Cardiovascular Exercise Tolerance: Good neg cardio ROS regular Normal    Neuro/Psych PSYCHIATRIC DISORDERS (Anxiety/Depression) Negative Neurological ROS  Negative Psych ROS   GI/Hepatic negative GI ROS, Neg liver ROS,   Endo/Other  Negative Endocrine ROS  Renal/GU negative Renal ROS  Genitourinary negative   Musculoskeletal   Abdominal   Peds  Hematology negative hematology ROS (+)   Anesthesia Other Findings Multiple loose upper and lower incisors.  Reproductive/Obstetrics negative OB ROS                           Anesthesia Physical Anesthesia Plan  ASA: II and Emergent  Anesthesia Plan: General and General LMA   Post-op Pain Management:    Induction:   Airway Management Planned:   Additional Equipment:   Intra-op Plan:   Post-operative Plan:   Informed Consent: I have reviewed the patients History and Physical, chart, labs and discussed the procedure including the risks, benefits and alternatives for the proposed anesthesia with the patient or authorized representative who has indicated his/her understanding and acceptance.   Dental Advisory Given  Plan Discussed with: CRNA  Anesthesia Plan Comments:         Anesthesia Quick Evaluation

## 2011-12-20 NOTE — Anesthesia Postprocedure Evaluation (Signed)
Anesthesia Post Note  Patient: Rebecca Andrade  Procedure(s) Performed: Procedure(s) (LRB): CYSTOSCOPY WITH RETROGRADE PYELOGRAM/URETERAL STENT PLACEMENT (Left)  Anesthesia type: General  Patient location: PACU  Post pain: Pain level controlled  Post assessment: Post-op Vital signs reviewed  Last Vitals:  Filed Vitals:   12/20/11 1900  BP: 113/76  Pulse: 96  Temp: 36.1 C  Resp: 12    Post vital signs: Reviewed  Level of consciousness: sedated  Complications: No apparent anesthesia complications

## 2011-12-20 NOTE — Discharge Instructions (Signed)
Ureteral Stent A ureteral stent is a soft plastic tube with multiple holes. The stent is inserted into a ureter to help drain urine from the kidney into the bladder. The tube has a coil on each end to keep it from falling out. One end stays in the kidney. The other end stays in the bladder. A stent cannot be seen from the outside. Usually it does not keep you from going about normal routines. A ureteral stent is used to bypass a blockage in your kidney or ureter. This blockage can be caused by kidney stones, scar tissue, pregnancy, or other causes. It can also be used during treatment to remove a kidney stone or to let a ureter heal after surgery. The stent allows urine to drain from the kidney into the bladder. It is most often taken out after the blockage has been removed or the ureter has healed. If a stent is needed for a long time, it will be changed every few months. HOME CARE INSTRUCTIONS   While the stent is in place, you may feel some discomfort. Certain movements may trigger pain or a feeling that you need to urinate. Your caregiver may give you pain medication. Only take over-the-counter or prescription medicines for pain, discomfort, or fever as directed by your caregiver. Do not take aspirin as this can make bleeding worse.   You may be given medications to prevent infection or bladder spasms. Be sure to take all medications as directed.   Drink plenty of fluids.   You may have small amounts of bleeding causing your urine to be slightly red. This is nothing to be concerned about.  REMOVAL OF THE STENT Your stent is left in until the blockage is resolved. This may take two weeks or longer. Before the stent is removed, you may have an x-ray make sure the ureter is open. The stent can be removed by your caregiver in the office. Medications may be given for comfort. Be sure to keep all follow-up appointments so your caregiver can check that you are healing properly. SEEK IMMEDIATE MEDICAL CARE  IF:   Your urine is dark red or has blood clots.   You are incontinent (leaking urine).   You have an oral temperature above 102 F (38.9 C), chills, nausea (feeling sick to your stomach), or vomiting.   Your pain is not relieved by pain medication. Do not take aspirin as this can make bleeding worse.   The end of the stent comes out of the urethra.

## 2011-12-20 NOTE — Brief Op Note (Signed)
12/20/2011  7:02 PM  PATIENT:  Rebecca Andrade  41 y.o. female  PRE-OPERATIVE DIAGNOSIS:  left ureteral obstruction, ureteral stone, hydronephrosis  POST-OPERATIVE DIAGNOSIS:  left ureteral obsrtuction  PROCEDURE:  Procedure(s) (LRB): CYSTOSCOPY WITH RETROGRADE PYELOGRAM/URETERAL STENT PLACEMENT (Left)  SURGEON:  Surgeon(s) and Role:    * Antony Haste, MD - Primary    ANESTHESIA:   general  EBL:   0  BLOOD ADMINISTERED:none  DRAINS: 6 x 26 cm JJ stent  LOCAL MEDICATIONS USED:  NONE  SPECIMEN:  Aspirate - urine from left kidney  DISPOSITION OF SPECIMEN: lab   DICTATION: #[id number]  PLAN OF CARE: Discharge to home after PACU  PATIENT DISPOSITION:  PACU - hemodynamically stable.   Delay start of Pharmacological VTE agent (>24hrs) due to surgical blood loss or risk of bleeding: no

## 2011-12-21 LAB — URINE CULTURE

## 2011-12-21 NOTE — Op Note (Signed)
Rebecca Andrade, ADELSTEIN NO.:  [id number]  MEDICAL RECORD NO.:  [id number]  LOCATION:  [facility]                         FACILITY:  [facility]  PHYSICIAN:  Jerilee Field, MD   DATE OF BIRTH:  1971-11-02  DATE OF PROCEDURE: DATE OF DISCHARGE:  12/20/2011                              OPERATIVE REPORT   PREOPERATIVE DIAGNOSES: 1. Left ureteral stone. 2. Left hydronephrosis. 3. Urinary tract infection.  POSTOPERATIVE DIAGNOSES: 1. Left ureteral stone. 2. Left hydronephrosis. 3. Urinary tract infection.  PROCEDURE: 1. Cystoscopy. 2. Left retrograde pyelogram. 3. Left ureteral stent placement.  SURGEON:  Jerilee Field, MD  TYPE OF ANESTHESIA:  General.  INDICATION FOR PROCEDURE:  [name] is a 41 year old female who has had continuous left flank pain.  A CT scan was obtained.  This showed an 8-mm stone in the left mid ureter.  Although she has been afebrile and with stable vitals, her urine was quite dirty and her last 2 cultures with a similar appearance grew out E. coli resistant to Bactrim. Therefore, I discussed with her the nature, risks, and benefits of cystoscopy with left retrograde and left ureteral stent placement for decompression of the left kidney.  We discussed I was hesitant to do ureteroscopy given her urine and possible infection and we would not treat the stone.  All questions were answered and she elected to proceed with cystoscopy and left stent placement.  We did discuss the importance of followup with the stent in place and stents can encrust, cause permanent renal damage.  FINDINGS:  On scout imaging, an opacification was seen over the left SI joint consistent with the renal stone.  Contrast outlined a normal distal ureter and then reached the stone and did not pass proximally confirming stones in the left ureter.  Once the stent was advanced above the stone, the wire was removed and contrast was again injected, which outlined a  mildly dilated renal calices and renal pelvis, confirmed proper wire placement.  DESCRIPTION OF PROCEDURE:  After consent was obtained, the patient was brought to the operating room and time-out was performed to confirm the patient and procedure.  After adequate anesthesia, she was placed in lithotomy position and prepped and draped in the usual fashion.  A cystoscope was passed per urethra.  Bladder was inspected and noted to be normal without tumor, stone, or foreign body.  The left ureteral orifice was visualized and cannulated with a 6-French open-ended catheter and contrast was injected in a gentle fashion into the left ureter.  Contrast did not pass proximal to the stent.  A Sensor wire was then advanced.  It did hit the stone and coiled and would not advance indicating the stone was somewhat impacted.  A 6-French catheter was advanced adjacent to the stone and then this braced the wire knots to where it slid past the stone and up into the middle pole of the kidney. The stent was then passed up to the renal pelvis and the wire was removed.  Contrast was injected again to confirm proper stent placement and this was done after some of the urine was drained and sent for culture.  The wire was then recoiled back  into the middle pole by advancing it through the open-ended catheter and the catheter was removed.  Over the wire, a 6 x 26 stent was placed.  The wire was removed.  A good coil was seen in the kidney and a good coil in the bladder.  The kidney drained copious amounts of blood, proteinaceous material, and dark urine.  The urine in the cup after the stent was placed in the left kidney was actually quite clear.  The patient's bladder was drained.  She was then taken to the recovery room in stable condition.          ______________________________ Jerilee Field, MD     ME/MEDQ  D:  12/20/2011  T:  12/21/2011  Job:  [id number]

## 2011-12-27 DIAGNOSIS — K5900 Constipation, unspecified: Secondary | ICD-10-CM | POA: Diagnosis not present

## 2012-01-02 ENCOUNTER — Other Ambulatory Visit (HOSPITAL_COMMUNITY): Payer: Self-pay | Admitting: Physician Assistant

## 2012-01-02 ENCOUNTER — Ambulatory Visit (HOSPITAL_COMMUNITY): Payer: Medicare Other | Admitting: Psychology

## 2012-01-02 DIAGNOSIS — N1330 Unspecified hydronephrosis: Secondary | ICD-10-CM | POA: Diagnosis not present

## 2012-01-02 DIAGNOSIS — N390 Urinary tract infection, site not specified: Secondary | ICD-10-CM | POA: Diagnosis not present

## 2012-01-02 DIAGNOSIS — N201 Calculus of ureter: Secondary | ICD-10-CM | POA: Diagnosis not present

## 2012-01-02 DIAGNOSIS — F332 Major depressive disorder, recurrent severe without psychotic features: Secondary | ICD-10-CM

## 2012-01-04 DIAGNOSIS — D500 Iron deficiency anemia secondary to blood loss (chronic): Secondary | ICD-10-CM | POA: Diagnosis not present

## 2012-01-04 DIAGNOSIS — K5900 Constipation, unspecified: Secondary | ICD-10-CM | POA: Diagnosis not present

## 2012-01-08 ENCOUNTER — Encounter (HOSPITAL_COMMUNITY): Payer: Self-pay | Admitting: Urology

## 2012-02-25 ENCOUNTER — Other Ambulatory Visit (HOSPITAL_COMMUNITY): Payer: Self-pay | Admitting: Physician Assistant

## 2012-03-05 ENCOUNTER — Ambulatory Visit (HOSPITAL_COMMUNITY): Payer: Medicare Other | Admitting: Physician Assistant

## 2012-03-06 ENCOUNTER — Ambulatory Visit (INDEPENDENT_AMBULATORY_CARE_PROVIDER_SITE_OTHER): Payer: Medicare Other | Admitting: Physician Assistant

## 2012-03-06 DIAGNOSIS — F988 Other specified behavioral and emotional disorders with onset usually occurring in childhood and adolescence: Secondary | ICD-10-CM | POA: Insufficient documentation

## 2012-03-06 DIAGNOSIS — F332 Major depressive disorder, recurrent severe without psychotic features: Secondary | ICD-10-CM | POA: Diagnosis not present

## 2012-03-06 MED ORDER — BUPROPION HCL ER (SR) 200 MG PO TB12: 200.0000 mg | ORAL_TABLET | Freq: Every day | ORAL | Status: DC

## 2012-03-06 MED ORDER — AMPHETAMINE-DEXTROAMPHET ER 25 MG PO CP24: 50.0000 mg | ORAL_CAPSULE | ORAL | Status: DC

## 2012-03-06 MED ORDER — DESVENLAFAXINE SUCCINATE ER 100 MG PO TB24: 200.0000 mg | ORAL_TABLET | Freq: Every day | ORAL | Status: DC

## 2012-03-06 MED ORDER — TOPIRAMATE 200 MG PO TABS: 200.0000 mg | ORAL_TABLET | Freq: Two times a day (BID) | ORAL | Status: DC

## 2012-03-06 MED ORDER — ARIPIPRAZOLE 10 MG PO TABS: 10.0000 mg | ORAL_TABLET | Freq: Every day | ORAL | Status: DC

## 2012-03-06 NOTE — Progress Notes (Signed)
   [facility] Follow-up Outpatient Visit  Shizuko S Paschal [birth year]  Date: 03/06/2012   Subjective: Rebecca Andrade presents today to followup on her medications prescribed for her depression and ADHD. She reports her mood is "so-so." She feels that her mood is improved slightly. She endorses good sleep, and states that her appetite is "too good." She feels that she has gained weight since her last appointment. She denies any suicidal or homicidal ideation. She denies any auditory or visual hallucinations. She complains that she has "blurry vision" from time to time. She asked if her Adderall could be increased to assist with weight loss.  There were no vitals filed for this visit.  Mental Status Examination  Appearance: Well groomed and casually dressed, weighed 183 pounds Alert: Yes Attention: good  Cooperative: Yes Eye Contact: Good Speech: Clear and even Psychomotor Activity: Psychomotor Retardation Memory/Concentration: Intact Oriented: person, place, time/date and situation Mood: Dysphoric Affect: Blunt Thought Processes and Associations: Goal Directed and Logical Fund of Knowledge: Good Thought Content: Normal Insight: Fair Judgement: Fair  Diagnosis: Maj. depressive disorder, recurrent, moderate; ADHD inattentive type.  Treatment Plan: We will continue all her medications as prescribed: Adderall XR 50 mg daily, Abilify 10 mg daily, Wellbutrin SR 200 mg daily, Pristiq 200 mg daily, Pamelor 75 mg at bedtime, Topamax 200 mg twice daily, and trazodone 200 mg at bedtime. She will followup in 3 months.  Najeh Credit, PA-C

## 2012-04-05 ENCOUNTER — Other Ambulatory Visit (HOSPITAL_COMMUNITY): Payer: Self-pay | Admitting: Physician Assistant

## 2012-06-06 ENCOUNTER — Ambulatory Visit (INDEPENDENT_AMBULATORY_CARE_PROVIDER_SITE_OTHER): Payer: Medicare Other | Admitting: Physician Assistant

## 2012-06-06 DIAGNOSIS — F988 Other specified behavioral and emotional disorders with onset usually occurring in childhood and adolescence: Secondary | ICD-10-CM

## 2012-06-06 DIAGNOSIS — F331 Major depressive disorder, recurrent, moderate: Secondary | ICD-10-CM

## 2012-06-06 MED ORDER — AMPHETAMINE-DEXTROAMPHET ER 25 MG PO CP24: 50.0000 mg | ORAL_CAPSULE | ORAL | Status: DC

## 2012-06-06 NOTE — Progress Notes (Signed)
   [facility] Follow-up Outpatient Visit  Clarissia S Paschal 1971/07/11  Date: 06/06/2012   Subjective: Puja presents today to followup on her treatment for depression and ADHD. When asked how she was doing she stated "all right." She feels that her medications are helping, and over the past couple of years she feels like her depression has improved somewhat. She states that her sleep is okay overall, but occasionally she wakes early in the morning and has difficulty falling back to sleep. When asked about her appetite she stated she is trying not to heat has much. She denies any suicidal or homicidal ideation. She denies any auditory or visual hallucinations.  There were no vitals filed for this visit.  Mental Status Examination  Appearance: Casual, weight = 178.8 pounds Alert: Yes Attention: good  Cooperative: Yes Eye Contact: Good Speech: Minimal, yet clear and coherent Psychomotor Activity: Psychomotor Retardation Memory/Concentration: Intact Oriented: person, place, time/date and situation Mood: Depressed Affect: Blunt and Depressed Thought Processes and Associations: Logical Fund of Knowledge: Good Thought Content: Normal Insight: Poor Judgement: Fair  Diagnosis: Maj. depressive disorder, recurrent, moderate; ADHD, inattentive type  Treatment Plan: Areya has expressed desire to "get better," and would like to decrease her level of depression. After her recommending exercises multiple times, she now agrees that she will go to the gym with her mother. We will continue all of her medications as previously prescribed and she will return for followup in 3 months.  Marcelline Temkin, PA-C

## 2012-09-09 ENCOUNTER — Other Ambulatory Visit (HOSPITAL_COMMUNITY): Payer: Self-pay | Admitting: Physician Assistant

## 2012-09-12 ENCOUNTER — Ambulatory Visit (INDEPENDENT_AMBULATORY_CARE_PROVIDER_SITE_OTHER): Payer: Medicare Other | Admitting: Physician Assistant

## 2012-09-12 DIAGNOSIS — F988 Other specified behavioral and emotional disorders with onset usually occurring in childhood and adolescence: Secondary | ICD-10-CM

## 2012-09-12 DIAGNOSIS — F331 Major depressive disorder, recurrent, moderate: Secondary | ICD-10-CM | POA: Diagnosis not present

## 2012-09-12 DIAGNOSIS — F332 Major depressive disorder, recurrent severe without psychotic features: Secondary | ICD-10-CM

## 2012-09-12 MED ORDER — AMPHETAMINE-DEXTROAMPHET ER 25 MG PO CP24: 50.0000 mg | ORAL_CAPSULE | ORAL | Status: DC

## 2012-09-12 MED ORDER — ARIPIPRAZOLE 10 MG PO TABS: 10.0000 mg | ORAL_TABLET | Freq: Every day | ORAL | Status: DC

## 2012-09-12 NOTE — Progress Notes (Signed)
    Health Follow-up Outpatient Visit  Dorthy S Paschal 1971-01-16  Date: 09/12/2012   Subjective: Ahlani presents today to followup on her treatment for depression and ADHD. She first reports that she did not join a gym, because she owes ability and must register under her mothers name, and her mother has not been cooperative. She does report that she is walking daily when the weather permits, approximately 10 minutes each time. She does admit that it does help her to feel "a little better." She cites that she feels she can breathe better, and her energy has improved some. She states that she is sleeping well. She reports her appetite is too good. She denies any suicidal or homicidal ideation. She denies any auditory or visual hallucinations.  There were no vitals filed for this visit.  Mental Status Examination  Appearance: Disheveled, weight 183.6 pounds Alert: Yes Attention: good  Cooperative: Yes Eye Contact: Good Speech: Clear and coherent Psychomotor Activity: Normal Memory/Concentration: Intact Oriented: person, place, time/date and situation Mood: Dysphoric Affect: Congruent Thought Processes and Associations: Linear Fund of Knowledge: Good Thought Content: Normal Insight: Fair Judgement: Fair  Diagnosis: Maj. depressive disorder, recurrent, moderate; ADHD inattentive type  Treatment Plan: We will continue her Adderall XR 50 mg daily, Abilify 10 mg daily, Wellbutrin SR 200 mg daily, Pamelor 75 mg at bedtime, Pristiq 200 mg daily, Topamax 200 mg twice daily, and trazodone 200 mg at bedtime. She will return for followup in 3 months. She is encouraged to increase the time and intensity of her walking.  Leylah Tarnow, PA-C

## 2012-10-05 ENCOUNTER — Other Ambulatory Visit (HOSPITAL_COMMUNITY): Payer: Self-pay | Admitting: Physician Assistant

## 2012-10-05 DIAGNOSIS — F332 Major depressive disorder, recurrent severe without psychotic features: Secondary | ICD-10-CM

## 2012-10-08 ENCOUNTER — Other Ambulatory Visit (HOSPITAL_COMMUNITY): Payer: Self-pay | Admitting: Physician Assistant

## 2012-12-12 ENCOUNTER — Ambulatory Visit (INDEPENDENT_AMBULATORY_CARE_PROVIDER_SITE_OTHER): Payer: Medicare Other | Admitting: Physician Assistant

## 2012-12-12 DIAGNOSIS — F988 Other specified behavioral and emotional disorders with onset usually occurring in childhood and adolescence: Secondary | ICD-10-CM

## 2012-12-12 DIAGNOSIS — F332 Major depressive disorder, recurrent severe without psychotic features: Secondary | ICD-10-CM | POA: Diagnosis not present

## 2012-12-12 MED ORDER — ZOLPIDEM TARTRATE 10 MG PO TABS: 10.0000 mg | ORAL_TABLET | Freq: Every evening | ORAL | Status: DC | PRN

## 2012-12-12 MED ORDER — AMPHETAMINE-DEXTROAMPHET ER 25 MG PO CP24: 50.0000 mg | ORAL_CAPSULE | ORAL | Status: DC

## 2012-12-13 NOTE — Progress Notes (Signed)
   [facility] Follow-up Outpatient Visit  Rebecca Andrade [birth year]  Date: 12/12/2012   Subjective: Rebecca Andrade presents today to followup on her treatment for depression. She reports that she has been extremely depressed since our last appointment. She reports that she is not leaving the home except when she absolutely has to. Her sleep is poor, and she reports she is only sleeping about one hour per night. She also reports that her appetite is poor. She doesn't want to be around people. She has stopped walking. She endorses some passive suicidal ideation, and denies any plan or intent. She denies any homicidal ideation or auditory or visual hallucinations. She is willing to see a therapist, but will not agree to the group therapy intensive outpatient program.  There were no vitals filed for this visit.  Mental Status Examination  Appearance: Casual Alert: Yes Attention: good  Cooperative: Yes Eye Contact: Good Speech: Clear and coherent Psychomotor Activity: Psychomotor Retardation Memory/Concentration: Intact Oriented: person, place, time/date and situation Mood: Depressed Affect: Depressed and Tearful Thought Processes and Associations: Logical Fund of Knowledge: Fair Thought Content: Suicidal ideation Insight: Fair Judgement: Fair  Diagnosis: Maj. depressive disorder, recurrent, severe; ADHD, inattentive type.  Treatment Plan: We will continue all of her medications, those being Adderall XR 50 mg daily, Abilify 10 mg daily, Wellbutrin SR 200 mg daily, Pamelor 75 mg at bedtime, prostatic 100 mg daily, Topamax 200 mg twice daily, trazodone 200 mg at bedtime, and Ambien 10 mg at bedtime. She will schedule an appointment to see Cleophas Dunker are as soon is there is an opening. She will return for a followup in 2 months.  Rebecca Boruff, PA-C

## 2012-12-19 ENCOUNTER — Ambulatory Visit (HOSPITAL_COMMUNITY): Payer: Self-pay | Admitting: Marriage and Family Therapist

## 2012-12-23 ENCOUNTER — Ambulatory Visit (HOSPITAL_COMMUNITY): Payer: Self-pay | Admitting: Marriage and Family Therapist

## 2013-01-04 ENCOUNTER — Other Ambulatory Visit (HOSPITAL_COMMUNITY): Payer: Self-pay | Admitting: Physician Assistant

## 2013-01-04 DIAGNOSIS — F332 Major depressive disorder, recurrent severe without psychotic features: Secondary | ICD-10-CM

## 2013-01-06 ENCOUNTER — Other Ambulatory Visit (HOSPITAL_COMMUNITY): Payer: Self-pay | Admitting: Physician Assistant

## 2013-01-07 ENCOUNTER — Other Ambulatory Visit (HOSPITAL_COMMUNITY): Payer: Self-pay | Admitting: Physician Assistant

## 2013-01-30 ENCOUNTER — Ambulatory Visit (HOSPITAL_COMMUNITY): Payer: Self-pay | Admitting: Marriage and Family Therapist

## 2013-02-02 ENCOUNTER — Other Ambulatory Visit (HOSPITAL_COMMUNITY): Payer: Self-pay | Admitting: Physician Assistant

## 2013-02-02 DIAGNOSIS — F332 Major depressive disorder, recurrent severe without psychotic features: Secondary | ICD-10-CM

## 2013-02-12 ENCOUNTER — Ambulatory Visit (INDEPENDENT_AMBULATORY_CARE_PROVIDER_SITE_OTHER): Payer: Medicare Other | Admitting: Physician Assistant

## 2013-02-12 DIAGNOSIS — F331 Major depressive disorder, recurrent, moderate: Secondary | ICD-10-CM

## 2013-02-12 DIAGNOSIS — F988 Other specified behavioral and emotional disorders with onset usually occurring in childhood and adolescence: Secondary | ICD-10-CM

## 2013-02-12 DIAGNOSIS — F332 Major depressive disorder, recurrent severe without psychotic features: Secondary | ICD-10-CM

## 2013-02-12 MED ORDER — ZOLPIDEM TARTRATE 10 MG PO TABS: 10.0000 mg | ORAL_TABLET | Freq: Every evening | ORAL | Status: DC | PRN

## 2013-02-12 MED ORDER — AMPHETAMINE-DEXTROAMPHET ER 25 MG PO CP24: 50.0000 mg | ORAL_CAPSULE | ORAL | Status: DC

## 2013-02-12 NOTE — Progress Notes (Signed)
   [facility] Follow-up Outpatient Visit  [name] 1971/09/25  Date: 02/12/2013   Subjective: [name] to followup on her treatment for depression and ADHD. She reports that she is doing somewhat better. She states that she is less depressed, and rates her depression as a 6 on a scale of 1-10 where 10 is the worst. She also reports that she is having some anxiety, and rates it as a 7 on a scale of 1-10. She reports that she is sleeping better with the Ambien, and reports that she goes to bed around 11 and wakes at about 6. She states that her appetite is okay, and she eats a diet of meat and potatoes. She has not really been getting any exercise recently.  She did not appear for her appointment to see Cleophas Dunker, but she has an appointment to see Geanie Berlin next week.  There were no vitals filed for this visit.  Mental Status Examination  Appearance: Casual, weight 172.2 pounds Alert: Yes Attention: good  Cooperative: Yes Eye Contact: Good Speech: Clear and coherent Psychomotor Activity: Normal Memory/Concentration: Intact Oriented: person, place, time/date and situation Mood: Dysphoric Affect: Congruent Thought Processes and Associations: Linear Fund of Knowledge: Good Thought Content: Normal Insight: Fair Judgement: Fair  Diagnosis: Maj. depressive disorder, recurrent, moderate; ADHD, inattentive type.  Treatment Plan: We will continue her Adderall XR 50 mg daily, Abilify 10 mg daily, Wellbutrin SR 200 mg daily, Pamelor 75 mg at bedtime, Pristiq 200 mg daily, Topamax 200 mg twice daily, Ambien 10 mg at bedtime, and trazodone 200 mg at bedtime. She will return for followup in 3 months. She is encouraged to increase the time and intensity of her walking.   Anjalee Cope, PA-C

## 2013-02-20 ENCOUNTER — Encounter (HOSPITAL_COMMUNITY): Payer: Self-pay | Admitting: Licensed Clinical Social Worker

## 2013-02-20 ENCOUNTER — Ambulatory Visit (HOSPITAL_COMMUNITY): Payer: Self-pay | Admitting: Licensed Clinical Social Worker

## 2013-02-20 NOTE — Progress Notes (Signed)
Patient ID: Rebecca Andrade, female   DOB: 17-Aug-1971, 42 y.o.   MRN: [id number] Patient was a no show for todays appointment.

## 2013-03-04 ENCOUNTER — Other Ambulatory Visit (HOSPITAL_COMMUNITY): Payer: Self-pay | Admitting: Physician Assistant

## 2013-04-02 ENCOUNTER — Encounter (HOSPITAL_COMMUNITY): Payer: Self-pay | Admitting: *Deleted

## 2013-04-02 NOTE — Progress Notes (Signed)
Pristiq ER 100 mg, TWO tablets daily authorized by [employer] from 03/26/13 to 03/26/14. Patient and [facility] [address] store notified of authorization.

## 2013-04-28 ENCOUNTER — Other Ambulatory Visit (HOSPITAL_COMMUNITY): Payer: Self-pay | Admitting: Physician Assistant

## 2013-05-14 ENCOUNTER — Ambulatory Visit (HOSPITAL_COMMUNITY): Payer: Self-pay | Admitting: Physician Assistant

## 2013-05-19 ENCOUNTER — Ambulatory Visit (INDEPENDENT_AMBULATORY_CARE_PROVIDER_SITE_OTHER): Payer: Medicare Other | Admitting: Physician Assistant

## 2013-05-19 VITALS — BP 111/74 | HR 84 | Ht 67.0 in | Wt 172.4 lb

## 2013-05-19 DIAGNOSIS — F332 Major depressive disorder, recurrent severe without psychotic features: Secondary | ICD-10-CM

## 2013-05-19 DIAGNOSIS — F988 Other specified behavioral and emotional disorders with onset usually occurring in childhood and adolescence: Secondary | ICD-10-CM

## 2013-05-19 MED ORDER — AMPHETAMINE-DEXTROAMPHET ER 25 MG PO CP24: 50.0000 mg | ORAL_CAPSULE | ORAL | Status: DC

## 2013-05-20 ENCOUNTER — Other Ambulatory Visit (HOSPITAL_COMMUNITY): Payer: Self-pay | Admitting: Physician Assistant

## 2013-05-20 MED ORDER — TRAZODONE HCL 100 MG PO TABS: ORAL_TABLET | ORAL | Status: DC

## 2013-05-21 NOTE — Progress Notes (Signed)
   [facility] Follow-up Outpatient Visit  Rebecca Andrade [birth year]  Date: 05/19/2013   Subjective: Rebecca Andrade presents today to followup on her treatment for depression and ADHD. She reports that she has been depressed for a few weeks. She reports that this is do to a cousin who is reporting a derogatory things about Seng 2 other family members. She reports that this cousin is bipolar, and sounds, from Deanda to description, it to currently be manic. Rebecca Andrade reports that she is sleeping well. She states that she is sleeping "too much," and admits to the evening for comfort. She endorses some passive suicidal thoughts, but denies any plan or intent. She denies any homicidal ideation or auditory or visual hallucinations.  Filed Vitals:   05/21/13 0903  BP: 111/74  Pulse: 84    Mental Status Examination  Appearance: Casual Alert: Yes Attention: good  Cooperative: Yes Eye Contact: Good Speech: Clear and coherent Psychomotor Activity: Normal Memory/Concentration: Intact Oriented: person, place, time/date and situation Mood: Depressed and Worthless Affect: Constricted Thought Processes and Associations: Linear Fund of Knowledge: Good Thought Content: Suicidal ideation Insight: Fair Judgement: Fair  Diagnosis: Maj. depressive disorder, recurrent, severe; ADHD, inattentive type  Treatment Plan: We will continue her Adderall XR 50 mg daily, Abilify 10 mg daily, Wellbutrin SR 200 mg daily, Pamelor 75 mg at bedtime, Pristiq 200 mg daily, Topamax 200 mg twice daily, Ambien 10 mg at bedtime, and trazodone 200 mg at bedtime. She will return for followup in 3 months. She is encouraged to increase her exercise level, and to not worry about what other people, especially sick people, are saying about her.   Jeannene Tschetter, PA-C

## 2013-06-03 ENCOUNTER — Other Ambulatory Visit (HOSPITAL_COMMUNITY): Payer: Self-pay | Admitting: Physician Assistant

## 2013-07-07 DIAGNOSIS — Z23 Encounter for immunization: Secondary | ICD-10-CM | POA: Diagnosis not present

## 2013-07-20 ENCOUNTER — Encounter (HOSPITAL_COMMUNITY): Payer: Self-pay | Admitting: *Deleted

## 2013-07-20 ENCOUNTER — Encounter (HOSPITAL_COMMUNITY): Payer: Self-pay

## 2013-07-20 ENCOUNTER — Emergency Department (HOSPITAL_COMMUNITY)
Admission: EM | Admit: 2013-07-20 | Discharge: 2013-07-20 | Disposition: A | Discharge status: Home / Self Care | Payer: Medicare Other | Source: Home / Self Care | Attending: Emergency Medicine | Admitting: Emergency Medicine

## 2013-07-20 ENCOUNTER — Inpatient Hospital Stay (HOSPITAL_COMMUNITY)
Admission: AD | Admit: 2013-07-20 | Discharge: 2013-07-25 | DRG: 885 | Disposition: A | Discharge status: Home / Self Care | Payer: Medicare Other | Attending: Psychiatry | Admitting: Psychiatry

## 2013-07-20 DIAGNOSIS — F332 Major depressive disorder, recurrent severe without psychotic features: Principal | ICD-10-CM | POA: Diagnosis present

## 2013-07-20 DIAGNOSIS — F411 Generalized anxiety disorder: Secondary | ICD-10-CM | POA: Insufficient documentation

## 2013-07-20 DIAGNOSIS — N189 Chronic kidney disease, unspecified: Secondary | ICD-10-CM | POA: Diagnosis present

## 2013-07-20 DIAGNOSIS — E876 Hypokalemia: Secondary | ICD-10-CM | POA: Insufficient documentation

## 2013-07-20 DIAGNOSIS — F3289 Other specified depressive episodes: Secondary | ICD-10-CM | POA: Insufficient documentation

## 2013-07-20 DIAGNOSIS — R45851 Suicidal ideations: Secondary | ICD-10-CM

## 2013-07-20 DIAGNOSIS — Z3202 Encounter for pregnancy test, result negative: Secondary | ICD-10-CM | POA: Insufficient documentation

## 2013-07-20 DIAGNOSIS — Z0289 Encounter for other administrative examinations: Secondary | ICD-10-CM | POA: Insufficient documentation

## 2013-07-20 DIAGNOSIS — F329 Major depressive disorder, single episode, unspecified: Secondary | ICD-10-CM | POA: Insufficient documentation

## 2013-07-20 DIAGNOSIS — Z79899 Other long term (current) drug therapy: Secondary | ICD-10-CM | POA: Insufficient documentation

## 2013-07-20 DIAGNOSIS — F1410 Cocaine abuse, uncomplicated: Secondary | ICD-10-CM | POA: Diagnosis present

## 2013-07-20 DIAGNOSIS — F1490 Cocaine use, unspecified, uncomplicated: Secondary | ICD-10-CM

## 2013-07-20 HISTORY — DX: Cerebral cysts: G93.0

## 2013-07-20 LAB — URINE MICROSCOPIC-ADD ON

## 2013-07-20 LAB — CBC WITH DIFFERENTIAL/PLATELET
Eosinophils Absolute: 0.1 10*3/uL (ref 0.0–0.7)
Eosinophils Relative: 1 % (ref 0–5)
Lymphs Abs: 1.9 10*3/uL (ref 0.7–4.0)
MCH: 30.3 pg (ref 26.0–34.0)
MCV: 88.6 fL (ref 78.0–100.0)
Platelets: 250 10*3/uL (ref 150–400)
RBC: 4.46 MIL/uL (ref 3.87–5.11)
RDW: 12.7 % (ref 11.5–15.5)

## 2013-07-20 LAB — URINALYSIS, ROUTINE W REFLEX MICROSCOPIC
Glucose, UA: NEGATIVE mg/dL
Hgb urine dipstick: NEGATIVE
Leukocytes, UA: NEGATIVE
Specific Gravity, Urine: 1.034 — ABNORMAL HIGH (ref 1.005–1.030)
pH: 6 (ref 5.0–8.0)

## 2013-07-20 LAB — COMPREHENSIVE METABOLIC PANEL
ALT: 18 U/L (ref 0–35)
Calcium: 8.9 mg/dL (ref 8.4–10.5)
Creatinine, Ser: 0.8 mg/dL (ref 0.50–1.10)
GFR calc Af Amer: >90 mL/min (ref >90)
Glucose, Bld: 103 mg/dL — ABNORMAL HIGH (ref 70–99)
Sodium: 139 mEq/L (ref 135–145)
Total Protein: 7.2 g/dL (ref 6.0–8.3)

## 2013-07-20 LAB — RAPID URINE DRUG SCREEN, HOSP PERFORMED
Amphetamines: NOT DETECTED
Benzodiazepines: NOT DETECTED
Cocaine: POSITIVE — AB
Opiates: NOT DETECTED

## 2013-07-20 LAB — PREGNANCY, URINE: Preg Test, Ur: NEGATIVE

## 2013-07-20 MED ORDER — POTASSIUM CHLORIDE 20 MEQ/15ML (10%) PO LIQD
40.0000 meq | Freq: Two times a day (BID) | ORAL | Status: DC
  Filled 2013-07-20: qty 30

## 2013-07-20 MED ORDER — HYDROXYZINE HCL 50 MG PO TABS
50.0000 mg | ORAL_TABLET | Freq: Three times a day (TID) | ORAL | Status: DC | PRN
  Administered 2013-07-21 – 2013-07-23 (×8): 50 mg via ORAL
  Filled 2013-07-20: qty 1

## 2013-07-20 MED ORDER — POTASSIUM CHLORIDE CRYS ER 20 MEQ PO TBCR
40.0000 meq | EXTENDED_RELEASE_TABLET | Freq: Once | ORAL | Status: AC
  Administered 2013-07-20: 40 meq via ORAL
  Filled 2013-07-20: qty 2

## 2013-07-20 MED ORDER — MAGNESIUM HYDROXIDE 400 MG/5ML PO SUSP
30.0000 mL | Freq: Every day | ORAL | Status: DC | PRN
  Administered 2013-07-21 – 2013-07-22 (×2): 30 mL via ORAL

## 2013-07-20 MED ORDER — ACETAMINOPHEN 325 MG PO TABS
650.0000 mg | ORAL_TABLET | Freq: Four times a day (QID) | ORAL | Status: DC | PRN
  Administered 2013-07-21: 650 mg via ORAL

## 2013-07-20 MED ORDER — ALUM & MAG HYDROXIDE-SIMETH 200-200-20 MG/5ML PO SUSP: 30.0000 mL | ORAL | Status: DC | PRN

## 2013-07-20 MED ORDER — TRAZODONE HCL 100 MG PO TABS
100.0000 mg | ORAL_TABLET | Freq: Every evening | ORAL | Status: DC | PRN
  Administered 2013-07-20 – 2013-07-24 (×5): 100 mg via ORAL
  Filled 2013-07-20 (×5): qty 1

## 2013-07-20 MED ORDER — POTASSIUM CHLORIDE ER 20 MEQ PO TBCR: 40.0000 meq | EXTENDED_RELEASE_TABLET | Freq: Two times a day (BID) | ORAL | Status: DC

## 2013-07-20 NOTE — ED Notes (Signed)
Patient reports that she is very depressed and "feel like killing myself." Patient denies having a plan and states she has never attempted suicide before , but has thoughts "all the time." Patient is tearful. Patient recently started smoking crack and states she does not want to do it any more.

## 2013-07-20 NOTE — BHH Counselor (Signed)
No beds [facility] - no beds [facility] - no beds [facility] - [facility] - no beds  [facility] [place] - no beds [facility] [place] - no beds [facility] [place] - no beds [facility] - no beds [facility] [place] - no beds [facility] [place] - no beds [facility] - no answer  Possibilities: [facility] - [place] - possible placement options [facility] no adult beds; 1 gero psych bed; low acuity [facility] - [place] [place] - possible beds but low acuity [facility] - a few female beds - low acuity [facility] - [facility] Most - case by case ( they usually don't admit on Sundays)
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
## 2013-07-20 NOTE — [facility] Group Notes (Signed)
[facility] Group Notes: (Clinical Social Work)   07/20/2013      Type of Therapy:  Group Therapy   Participation Level:  Did Not Attend    Konstance Happel Grossman-Orr, LCSW 07/20/2013, 4:35 PM     
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
## 2013-07-20 NOTE — Progress Notes (Signed)
Patient ID: Rebecca Andrade, female   DOB: 1971-09-02, 42 y.o.   MRN: [id number] Pt admitted to [facility] adult unit voluntarily. Pt reports feeling increasingly depressed over the past two months, with vague fleeting suicidal ideation, no specific plan. "I don't care how I die." Pt  With depressed mood,anxious and tearful. Pt stated she began using crack 3 months ago '' to help cope''. Pt stated she has been around "the wrong kind of people" and that she is using 1-2 x/week and uses $40-50, last use 3 hrs prior to arrival at [facility] and pt used $50 by report. Pt denies use of other drugs or alcohol. Pt denies HI or psychosis. She states ''I've been depressed since 2005 but none of the medicines are really working, i need some help, I told my son about this and I'm trying to get help'' Pt search completed, no skin issues or contraband found. Pt oriented to unit and ward rules. She is able to contract for safety and denies any acute concerns at this time. Will continue to monitor q 15 minutes for safety.

## 2013-07-20 NOTE — B[facility] Counselor (Signed)
Pt has had one prior inpatient admission at [facility] in 2005 for SI by report. Pt stated she feels that she is "alone and nobody cares."  Consulted with Berneice Heinrich, [facility], who agreed pt needed to be transported to [facility] for med clearance.   Called and consulted with EDP Puthevel, who accepted pt to [facility] pending a bed. Per Inetta Fermo, pt has a bed available (504-1) and can be transported to [facility] once med cleared. Updated TTS staff and ED staff.  Per assessment completed by Raymon Mutton.  See full note in EPIC
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
## 2013-07-20 NOTE — ED Notes (Signed)
Pt escorted to discharge window. Pt verbalized understanding discharge instructions. In no acute distress.  

## 2013-07-20 NOTE — [facility] Counselor (Signed)
Paper work complete for pt to go to [facility] and nurse to coordinate transfer / discharge with security transport

## 2013-07-20 NOTE — [facility] Group Notes (Signed)
[facility] Group Notes:  (Nursing/MHT/Case Management/Adjunct)  Date:  07/20/2013  Time:  4:50 PM  Type of Therapy:  Nurse Education  Participation Level:  Did Not Attend  Participation Quality:  na  Affect:  na  Cognitive:  na  Insight:  None  Engagement in Group:  na  Modes of Intervention:  na  Summary of Progress/Problems: DIAGNOSIS EDUCATION VIDEO PT DID NOT ATTEND.   Rebecca Andrade 07/20/2013, 4:50 PM

## 2013-07-20 NOTE — ED Notes (Signed)
Attempted to call report. Receiving nurse with another patient and is to call triage when available.

## 2013-07-21 DIAGNOSIS — F1410 Cocaine abuse, uncomplicated: Secondary | ICD-10-CM

## 2013-07-21 DIAGNOSIS — F322 Major depressive disorder, single episode, severe without psychotic features: Secondary | ICD-10-CM

## 2013-07-21 MED ORDER — FLUOXETINE HCL 10 MG PO CAPS
10.0000 mg | ORAL_CAPSULE | Freq: Every day | ORAL | Status: DC
  Administered 2013-07-21 – 2013-07-22 (×2): 10 mg via ORAL
  Filled 2013-07-21 (×5): qty 1

## 2013-07-21 NOTE — Progress Notes (Signed)
Writer entered patients room and observed her lying in bed awake tearful. Writer spoke with patient 1:1 and she reports dealing with depression and feels that her medications are not working, she reports that she started hanging around the wrong people and started using crack. Patient is very remorseful for getting started on this. She reports that she told her 43 yr old son and her mother that she was coming in to get help. Her son is supportive but she reports that her mother always makes her feel bad about herself and not supportive. Writer encouraged patient to speak with her doctor concerning her medications and a possible need for adjustment. Patient inquired about medication to aid with sleep and received trazadone. Encouraged and praised patient for coming in to seek help. Safety maintained with 15 min checks, will continue to monitor. Patient attended group but did not participate.

## 2013-07-21 NOTE — [facility] Group Notes (Signed)
[facility] LCSW Aftercare Discharge Planning Group Note   07/21/2013 8:45 AM  Participation Quality:  Alert and Appropriate   Mood/Affect:  Appropriate, Flat and Depressed  Depression Rating:  8  Anxiety Rating:  8  Thoughts of Suicide:  Pt denies SI/HI  Will you contract for safety?   Yes  Current AVH:  Pt denies   Plan for Discharge/Comments:  Pt attended discharge planning group and actively participated in group.  CSW provided pt with today's workbook.  Pt reports feeling "so so" today.  Pt states that she came to the hospital for depression.  Pt states that she lives in [place]. Pt follows up with Jorje Guild, PA in [facility] for medication management.  Pt wants a referral for therapy.  CSW will assess for appropriate referrals.  No further needs voiced by pt at this time.    Transportation Means:  Pt reports access to transportation - reports mother will pick pt up  Supports: No supports mentioned  Reyes Ivan, LCSWA 07/21/2013 9:55 AM

## 2013-07-21 NOTE — Progress Notes (Signed)
Recreation Therapy Notes  Date: 09.15.2014 Time: 3:00pm Location: [address] Hall Dayroom  Group Topic: Coping Skills  Goal Area(s) Addresses:  Patient will verbalize importance of recognizing emotions. Patient will identify at least one emotion. Patient will successfully represent varying emotions in pictures or words.   Behavioral Response: Appropriate, Attentive, Engaged  Intervention: Art  Activity: Emotion Wheel. As a group patients identified 8 emotions. Using the provided worksheet patients were asked to represent emotions identified by group in pictures of words.    Education: Emotional Recognition, Emotional Regulation, Coping Skills  Education Outcome: Acknowledges Understanding  Clinical Observations/Feedback: Patient contributed to group discussion, identifying emotions with group members. Patient actively participated in depicting identified emotions on her worksheet. Patient participated in group discussion, discussing the importance of being able to identify specific emotions, as well as identifying a coping mechanism she can use when needed. Patient verbalized understanding of relationships of emotions on worksheet to each other, as well as emotional balance as it contributes to whole wellness.   Marykay Lex Laquashia Mergenthaler, LRT/CTRS  Cleavon Goldman L 07/21/2013 5:06 PM

## 2013-07-21 NOTE — Progress Notes (Signed)
Psychoeducational Group Note  Date:  07/20/2013 Time:  2000  Group Topic/Focus:  Wrap-Up Group:   The focus of this group is to help patients review their daily goal of treatment and discuss progress on daily workbooks.  Participation Level: Did Not Attend  Participation Quality:  Not Applicable  Affect:  Not Applicable  Cognitive:  Not Applicable  Insight:  Not Applicable  Engagement in Group: Not Applicable  Additional Comments:  The patient did not attend group since she was sleeping in her bedroom.   Araceli Coufal S 07/21/2013, 12:08 AM

## 2013-07-21 NOTE — Progress Notes (Signed)
Patient ID: Rebecca Andrade, female   DOB: 1971-10-17, 42 y.o.   MRN: [id number] D- Patient reports fair sleep and improving appetite.  Her energy level is low and hr ability to pay attention is poor.  She rates her depression and her hopelessness at 9/10.  Patient requested a laxative after breakfast this am but never returned to get it.A- checked on patient at frequent intervals R- She has slept most of the day.

## 2013-07-21 NOTE — Tx Team (Signed)
Interdisciplinary Treatment Plan Update (Adult)  Date: 07/21/2013  Time Reviewed:  9:45 AM  Progress in Treatment: Attending groups: Yes Participating in groups:  Yes Taking medication as prescribed:  Yes Tolerating medication:  Yes Family/Significant othe contact made: CSW assessing  Patient understands diagnosis:  Yes Discussing patient identified problems/goals with staff:  Yes Medical problems stabilized or resolved:  Yes Denies suicidal/homicidal ideation: Yes Issues/concerns per patient self-inventory:  Yes Other:  New problem(s) identified: N/A  Discharge Plan or Barriers: CSW assessing for appropriate referrals.  Reason for Continuation of Hospitalization: Anxiety Depression Medication Stabilization  Comments: N/A  Estimated length of stay: 3-5 days  For review of initial/current patient goals, please see plan of care.  Attendees: Patient:     Family:     Physician:  Dr. Johnalagadda 07/21/2013 12:40 PM   Nursing:   Carol Davis, RN 07/21/2013 12:40 PM   Clinical Social Worker:  Elzia Hott Horton, LCSWA 07/21/2013 12:40 PM   Other: Neil Mashburn, PA 07/21/2013 12:40 PM   Other:   Other:    Other:     Other:    Other:    Other:    Other:    Other:    Other:     Scribe for Treatment Team:   Horton, Cherilyn Sautter Nicole, 07/21/2013 12:40 PM   

## 2013-07-21 NOTE — Progress Notes (Signed)
Adult Psychoeducational Group Note  Date:  07/21/2013 Time:  10:35 PM  Group Topic/Focus:  Goals Group:   The focus of this group is to help patients establish daily goals to achieve during treatment and discuss how the patient can incorporate goal setting into their daily lives to aide in recovery.  Participation Level:  Active  Participation Quality:  Appropriate  Affect:  Appropriate  Cognitive:  Alert  Insight: Appropriate  Engagement in Group:  Distracting  Modes of Intervention:  Discussion  Additional Comments:  Pt stated the day was better got a chance to talk more with others.  Terie Purser R 07/21/2013, 10:35 PM
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
## 2013-07-21 NOTE — [facility] Group Notes (Signed)
[facility] LCSW Group Therapy  07/21/2013  1:15 PM   Type of Therapy:  Group Therapy  Participation Level:  Did Not Attend  Reyes Ivan, LCSWA 07/21/2013 2:42 PM
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
## 2013-07-22 MED ORDER — BISACODYL 5 MG PO TBEC
10.0000 mg | DELAYED_RELEASE_TABLET | Freq: Once | ORAL | Status: AC
  Administered 2013-07-22: 10 mg via ORAL
  Filled 2013-07-22 (×2): qty 2

## 2013-07-22 MED ORDER — FLUOXETINE HCL 20 MG PO CAPS
20.0000 mg | ORAL_CAPSULE | Freq: Every day | ORAL | Status: DC
  Administered 2013-07-23 – 2013-07-25 (×3): 20 mg via ORAL
  Filled 2013-07-22 (×4): qty 1

## 2013-07-22 NOTE — Progress Notes (Signed)
D:  Pt states she slept fair, appetite is good. Pt rates her depression and hopelessness at a 7, with 10 feeling the worse. Pt denies SI/HI. A: Pt requested "something for anxiety and constipation." 15 min cks done. R: Pt received Vistaril as ordered for anxiety and Milk of Magnesia for constipation. Pt going to groups, mood anxious. Pt denies SI/HI. Safety maintained.

## 2013-07-22 NOTE — [facility] Suicide Risk Assessment (Signed)
[facility] INPATIENT:  Family/Significant Other Suicide Prevention Education  Suicide Prevention Education:  Education Completed; Nadine Counts, Mother, [phone]; has been identified by the patient as the family member/significant other with whom the patient will be residing, and identified as the person(s) who will aid the patient in the event of a mental health crisis (suicidal ideations/suicide attempt).  With written consent from the patient, the family member/significant other has been provided the following suicide prevention education, prior to the and/or following the discharge of the patient.  The suicide prevention education provided includes the following:  Suicide risk factors  Suicide prevention and interventions  National Suicide Hotline telephone number  [facility] assessment telephone number  [facility] Emergency Assistance 911  [facility] and/or Residential Mobile Crisis Unit telephone number  Request made of family/significant other to:  Remove weapons (e.g., guns, rifles, knives), all items previously/currently identified as safety concern.  Mother reports patient does not have access to guns.  Remove drugs/medications (over-the-counter, prescriptions, illicit drugs), all items previously/currently identified as a safety concern.  The family member/significant other verbalizes understanding of the suicide prevention education information provided.  The family member/significant other agrees to remove the items of safety concern listed above.  Wynn Banker 07/22/2013, 3:59 PM

## 2013-07-22 NOTE — Progress Notes (Signed)
Adult Psychoeducational Group Note  Date:  07/22/2013 Time:  10:58 PM  Group Topic/Focus:  Goals Group:   The focus of this group is to help patients establish daily goals to achieve during treatment and discuss how the patient can incorporate goal setting into their daily lives to aide in recovery.  Participation Level:  Active  Participation Quality:  Appropriate  Affect:  Appropriate  Cognitive:  Appropriate  Insight: Appropriate  Engagement in Group:  Engaged  Modes of Intervention:  Discussion  Additional Comments:  Pt stated that he day was better and the counselor help her a lot today.  Terie Purser R 07/22/2013, 10:58 PM

## 2013-07-22 NOTE — Progress Notes (Signed)
Patient did not attend 0900 RN group. 

## 2013-07-22 NOTE — Progress Notes (Signed)
Patient ID: Rebecca Andrade, female   DOB: 03/21/1971, 42 y.o.   MRN: [id number]  D: Pt denies SI/HI/AVH . Pt is pleasant and cooperative. "I'm ready to go"  A: Pt was offered support and encouragement. Pt was given scheduled medications. Pt was encourage to attend groups. Q 15 minute checks were done for safety.   R:Pt attends groups and interacts well with peers and staff. Pt is taking medication. Pt receptive to treatment and safety maintained on unit.

## 2013-07-22 NOTE — Progress Notes (Signed)
Recreation Therapy Notes  Date: 09.16.2014 Time: 2:45pm Location: [address] Dayroom  Group Topic: Animal Assisted Activities (AAA)  Behavioral Response: Engaged, Appropriate  Affect: Euthymic to bright at times  Clinical Observations/Feedback: Dog Team: [employer] Patient interacted appropriately with peer, dog team, LRT and MHT.   Marykay Lex Garnet Chatmon, LRT/CTRS  Jearl Klinefelter 07/22/2013 4:51 PM

## 2013-07-22 NOTE — Progress Notes (Signed)
Pt presents with a blunted affect this evening. Pt reports having some anxiety this evening. She is requesting a MRI for her brain cyst. She reports having blurred vision at times. Its been over 8 yrs since her cyst was discovered and assessed. Pt reports not following up on a yearly basis as instructed to 8 years ago. A note has been placed on the front of her chart. Pt is also requesting milk of mg for constipation. Pt informed of order instructions for one administration daily. Pt encouraged to give the med time and to report effectiveness in the morning. Pt is currently denying any SI/HI/AVH. Pt attended group this evening. A: Writer administered scheduled and prn medications to pt. Continued support and availability as needed was extended to this pt. Staff continue to monitor pt with q42min checks.  R: No adverse drug reactions noted. Pt receptive to treatment. Pt remains safe at this time.
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
## 2013-07-22 NOTE — [facility] Group Notes (Signed)
[facility] LCSW Group Therapy      Feelings About Diagnosis 1:15 - 2:30 PM         07/22/2013  3:09 PM     Type of Therapy:  Group Therapy  Participation Level:  Active  Participation Quality:  Appropriate  Affect: Depressed/Tearful  Cognitive:  Alert and Appropriate  Insight:  Developing/Improving and Engaged  Engagement in Therapy:  Developing/Improving and Engaged  Modes of Intervention:  Discussion, Education, Exploration, Problem-Solving, Rapport Building, Support  Summary of Progress/Problems:  Patient actively participated in group. Patient discussed past and present diagnosis and the effects it has had on  life.  Patient talked about family and society being judgmental and the stigma associated with having a mental health diagnosis.  Patient talked about feeling alone with her illness due to family not being willing to talk with her about how she feels.  Patient given information on support services offered by [facility].   Wynn Banker 07/22/2013  3:09 PM

## 2013-07-22 NOTE — Progress Notes (Signed)
Adult Psychoeducational Group Note  Date:  07/22/2013 Time:  11:00am Group Topic/Focus:  Recovery Goals:   The focus of this group is to identify appropriate goals for recovery and establish a plan to achieve them.  Participation Level:  Active  Participation Quality:  Appropriate and Attentive  Affect:  Appropriate and Flat  Cognitive:  Alert and Appropriate  Insight: Appropriate and Good  Engagement in Group:  Engaged  Modes of Intervention:  Discussion and Education  Additional Comments:  Pt attended and participated in group. When ask what recovery meant to her pt stated learning how to cope with things better and be around positive people.  Shelly Bombard D 07/22/2013, 1:33 PM

## 2013-07-23 MED ORDER — MAGNESIUM CITRATE PO SOLN
1.0000 | Freq: Once | ORAL | Status: AC
  Administered 2013-07-23: 1 via ORAL

## 2013-07-23 NOTE — [facility] Group Notes (Signed)
[facility] LCSW Aftercare Discharge Planning Group Note   07/23/2013 9:57 AM  Participation Quality:  Appropriate  Mood/Affect:  Appropriate  Depression Rating:  0  Anxiety Rating:  0  Thoughts of Suicide:  No  Will you contract for safety?   NA  Current AVH:  No  Plan for Discharge/Comments:  Patient attending discharge planning group and actively participated in group.  She advised of not feeling well due to being unale to have a bowel movement but otherwise, okay.  She is hopeful to discharge home today.  CSW provided all participants with daily workbook and information on services offered by [facility].   Transportation Means: Patient has transportation.   Supports:  Patient has a good support system.   Kashauna Celmer, Joesph July

## 2013-07-23 NOTE — Progress Notes (Signed)
Patient ID: Rebecca Andrade, female   DOB: July 09, 1971, 42 y.o.   MRN: [id number] PER STATE REGULATIONS 482.30  THIS CHART WAS REVIEWED FOR MEDICAL NECESSITY WITH RESPECT TO THE PATIENT'S ADMISSION/ DURATION OF STAY.  NEXT REVIEW DATE: 07/27/2013  Willa Rough, RN, BSN CASE MANAGER

## 2013-07-23 NOTE — Progress Notes (Signed)
D: Patient denies SI/HI and auditory and visual hallucinations. The patient has a depressed mood and affect. The patient reports feeling that she is "ready to go" although patient is observed to have a depressed, flat affect during interactions with staff. The patient is reporting constipation and states that exercise, water, prune juice, and milk of magnesia has not worked for her. The patient is attending groups and interacting appropriately within the milieu.  A: Patient given emotional support from RN. Patient encouraged to come to staff with concerns and/or questions. Patient's medication routine continued. Patient's orders and plan of care reviewed. MD/PA notified of patient's constipation ordered and orders were given for a bottle of mag citrate.  R: Patient remains appropriate and cooperative. Patient given mag citrate. Patient notified to inform RN of bowel movement. Will continue to monitor patient q15 minutes for safety.

## 2013-07-23 NOTE — Progress Notes (Signed)
Adult Psychoeducational Group Note  Date:  07/23/2013 Time:  9:48 PM  Group Topic/Focus:  Wrap-Up Group:   The focus of this group is to help patients review their daily goal of treatment and discuss progress on daily workbooks.  Participation Level:  Active  Participation Quality:  Appropriate and Supportive  Affect:  Appropriate  Cognitive:  Appropriate  Insight: Appropriate  Engagement in Group:  Engaged  Modes of Intervention:  Discussion  Additional Comments:  Pt stated that one good thing that was bale to happen today was that she was able to talk to her son and that although he is only 39 yrs old that he helps her as her support system and that she can tell him everything. She says that she is interested in making friends her age but that she finds it hard to trust others and that her mother would be her plan for safety if she absolutely needs her but that she really does not have a good relationship with her because her mother tells all of her business.   Rebecca Andrade 07/23/2013, 9:48 PM

## 2013-07-23 NOTE — Tx Team (Signed)
Interdisciplinary Treatment Plan Update   Date Reviewed:  07/23/2013  Time Reviewed:  12:29 PM  Progress in Treatment:   Attending groups: Yes Participating in groups: Yes Taking medication as prescribed: Yes  Tolerating medication: Yes Family/Significant other contact made: Yes, contact made with mother Patient understands diagnosis: Yes  Discussing patient identified problems/goals with staff: Yes Medical problems stabilized or resolved: Yes Denies suicidal/homicidal ideation: Yes Patient has not harmed self or others: Yes  For review of initial/current patient goals, please see plan of care.  Estimated Length of Stay:  1-2 days  Reasons for Continued Hospitalization:  Anxiety Depression Medication stabilization  New Problems/Goals identified:    Discharge Plan or Barriers:   Home with outpatient follow up [facility]  Additional Comments: N/A  Attendees:  Patient:  07/23/2013 12:29 PM   Signature: Mervyn Gay, MD 07/23/2013 12:29 PM  Signature:  Verne Spurr, PA 07/23/2013 12:29 PM  Signature: Liborio Nixon RN 07/23/2013 12:29 PM  Signature:  Nestor Ramp, RN 07/23/2013 12:29 PM  Signature:   07/23/2013 12:29 PM  Signature:  Juline Patch, LCSW 07/23/2013 12:29 PM  Signature:  Reyes Ivan, LCSW 07/23/2013 12:29 PM  Signature:  Sharin Grave Coordinator 07/23/2013 12:29 PM  Signature:  07/23/2013 12:29 PM  Signature: Leighton Parody, RN 07/23/2013  12:29 PM  Signature:    Signature:      Scribe for Treatment Team:   Juline Patch,  07/23/2013 12:29 PM

## 2013-07-23 NOTE — Progress Notes (Signed)
Patient ID: Rebecca Andrade, female   DOB: September 11, 1971, 42 y.o.   MRN: [id number] D: Pt. Visible on the unit, in dayroom watching TV. Pt. Reports upon admission "very bad depression, hanging around the wrong people started using drugs" "I'm going to delete their phone numbers" , now depression at "1" of 10. Pt. Reports group been helpful "listening to other people talk help me express how I feel" A: Writer introduced self to client and encouraged her to move forward with mental wellness and stop drug use and follow plans to stop hanging around the same people. Writer encouraged group. Staff will monitor q15min for safety. R: Pt. Is safe on the unit and attended group.

## 2013-07-23 NOTE — Progress Notes (Signed)
Adult Psychoeducational Group Note  Date:  07/23/2013 Time:  10:00 11:00am Group Topic/Focus:  Therapeutic Activity and Personal Development Groups  Participation Level:  Active  Participation Quality:  Appropriate and Attentive  Affect:  Appropriate  Cognitive:  Alert and Appropriate  Insight: Appropriate  Engagement in Group:  Engaged  Modes of Intervention:  Discussion and Education  Additional Comments:  Pt attended and participated in both groups. When ask what does she live for pt stated her son is what she lives for. When ask what her trigger was pt stated  When  someone is constantly on her back nagging her about something.  Shelly Bombard D 07/23/2013, 12:01 PM

## 2013-07-23 NOTE — [facility] Group Notes (Signed)
[facility] LCSW Group Therapy  Emotional Regulation 1:15 - 2: 30 PM        07/23/2013 2:34 PM   Type of Therapy:  Group Therapy  Participation Level:  Did not attend group.  Wynn Banker 07/23/2013 2:34 PM

## 2013-07-23 NOTE — Progress Notes (Signed)
Recreation Therapy Notes  Date: 09.17.2014 Time: 3:00pm Location: [address] Dayroom  Group Topic: Communication, Team Building, Problem Solving  Goal Area(s) Addresses:  Patient will effectively work with peer towards shared goal.  Patient will identify skill used to make activity successful.  Patient will identify how skills used during activity can be used to reach post d/c goals.   Behavioral Response: Did not attend  Jearl Klinefelter, LRT/CTRS  Arnell Mausolf L 07/23/2013 5:00 PM
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
## 2013-07-24 NOTE — [facility] Group Notes (Signed)
[facility] LCSW Group Therapy  Living A Balanced Life  1:15 - 2: 30          07/24/2013 3:18 PM    Type of Therapy:  Group Therapy  Participation Level:  Appropriate    Wynn Banker 07/24/2013  3:18 PM

## 2013-07-24 NOTE — Progress Notes (Signed)
D: Patient denies SI/HI and auditory and visual hallucinations. The patient has a depressed mood and affect but reports feeling "a little more positive today." The patient states that her anxiety and depression are decreasing but that she still "needs vistaril" to help her through the day. The patient is attending groups and participating within the milieu.   A: Patient given emotional support from RN. Patient encouraged to come to staff with concerns and/or questions. Patient's medication routine continued. Patient's orders and plan of care reviewed.  R: Patient remains appropriate and cooperative. Will continue to monitor patient q15 minutes for safety.

## 2013-07-24 NOTE — Progress Notes (Signed)
Adult Psychoeducational Group Note  Date:  07/24/2013 Time:  6:23 PM  Group Topic/Focus:  Overcoming Stress:   The focus of this group is to define stress and help patients assess their triggers.  Participation Level:  Active  Participation Quality:  Appropriate and Sharing  Affect:  Appropriate  Cognitive:  Appropriate  Insight: Appropriate  Engagement in Group:  Engaged  Modes of Intervention:  Discussion, Education, Socialization and Support  Additional Comments:  Pt stated her stressor is having other people telling her what to do or how to do things. Pt stated she wanted to learn how to swim to relieve stress.  Caswell Corwin 07/24/2013, 6:23 PM

## 2013-07-24 NOTE — Progress Notes (Signed)
Patient ID: Rebecca Andrade, female   DOB: [birth year], 42 y.o.   MRN: [id number] D: Patient in dayroom on approach. Pt mood/affect seemed depressed and sad.  Pt c/o constipation.  Pt denies SI/HI/AVH and pain. Pt attended evening karaoke and was supportive of peers. Pt denies any other needs or concerns.  Cooperative with assessment. No acute distressed noted at this time.   A: Met with pt 1:1. Medications administered as prescribed. Pt encouraged to drink more fluids and walk around the unit. Writer encouraged pt to discuss feelings. Pt encouraged to come to staff with any question or concerns.   R: Patient remains safe. She is complaint with medications and denies any adverse reaction. Continue current POC.

## 2013-07-24 NOTE — Progress Notes (Signed)
Recreation Therapy Notes  Date: 09.18.2014 Time: 2:45pm Location: [address] Dayroom  Group Topic: Software engineer Activities (AAA)  Behavioral Response: Engaged, Attentive, Appropriate  Affect: Euthymic  Clinical Observations/Feedback: Dog Team: [employer] Patient interacted appropriately with peer, dog team, LRT and MHT.   Marykay Lex Haiven Nardone, LRT/CTRS  Jearl Klinefelter 07/24/2013 4:28 PM
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
## 2013-07-25 MED ORDER — NORGESTIMATE-ETH ESTRADIOL 0.25-35 MG-MCG PO TABS: 1.0000 | ORAL_TABLET | Freq: Every day | ORAL | Status: DC

## 2013-07-25 MED ORDER — TRAZODONE HCL 100 MG PO TABS: 200.0000 mg | ORAL_TABLET | Freq: Every day | ORAL | Status: DC

## 2013-07-25 MED ORDER — TRAZODONE HCL 100 MG PO TABS
200.0000 mg | ORAL_TABLET | Freq: Every day | ORAL | Status: DC
  Filled 2013-07-25: qty 20

## 2013-07-25 MED ORDER — FLUOXETINE HCL 20 MG PO CAPS: 20.0000 mg | ORAL_CAPSULE | Freq: Every day | ORAL | Status: DC
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
## 2013-07-25 NOTE — BHH Group Notes (Signed)
[facility] LCSW Aftercare Discharge Planning Group Note   07/25/2013 10:27 AM    Participation Quality:  Appropraite  Mood/Affect:  Appropriate  Depression Rating:  0  Anxiety Rating:  0  Thoughts of Suicide:  No  Will you contract for safety?   NA  Current AVH:  No  Plan for Discharge/Comments:  Patient attending discharge planning group and actively participated in group.  She reports being ready to discharge home today and will follow up with [facility].  CSW provided all participants with daily workbook and information on services offered by [facility].   Transportation Means: Patient has transportation.   Supports:  Patient has a support system.   Patrick Sohm, Joesph July

## 2013-07-25 NOTE — Progress Notes (Signed)
[facility] Adult Case Management Discharge Plan :  Will you be returning to the same living situation after discharge: Yes,  Patient will return to her home. At discharge, do you have transportation home?:Yes,  Patient to arrange transporation home. Do you have the ability to pay for your medications:Yes,  Patient is able to obtain medications.  Release of information consent forms completed and in the chart;  Patient's signature needed at discharge.  Patient to Follow up at: Follow-up Information   Follow up with [facility] Out[facility] On 08/07/2013. (Appointment scheduled at 8:30 am with Orvan July for therapy)    Contact information:   [address] [place], [place] [address] Phone: [phone]      Follow up with [facility] On 08/19/2013. (Appointment scheduled at 1:15 pm with Jorje Guild, PA for medication management)    Contact information:   [address] [place], [place] [address] Phone: [phone]      Patient denies SI/HI:   Patient no longer endorses SI/HI other thought self harm     Safety Planning and Suicide Prevention discussed: .Reviewed with all patients during discharge planning group   Monifah Freehling, Joesph July 07/25/2013, 10:28 AM

## 2013-07-25 NOTE — Progress Notes (Signed)
Adult Psychoeducational Group Note  Date:  07/25/2013 Time:  1:39 PM  Group Topic/Focus:  Early Warning Signs:   The focus of this group is to help patients identify signs or symptoms they exhibit before slipping into an unhealthy state or crisis.  Participation Level:  Minimal  Participation Quality:  Attentive  Affect:  Appropriate  Cognitive:  Alert  Insight: Good  Engagement in Group:  Engaged  Modes of Intervention:  Discussion, Exploration, Socialization and Support  Additional Comments:  Pt came to group and shared that 2 of her early warning signs were being a people pleaser and worrying.  Cathlean Cower 07/25/2013, 1:39 PM

## 2013-07-25 NOTE — Progress Notes (Signed)
Pt discharged per MD orders; pt currently denies SI/HI and auditory/visual hallucinations; pt was given education by RN regarding follow-up appointments and medications and pt denied any questions or concerns about these instructions; pt was then escorted to hospital lobby (no belongings in locker)

## 2013-07-25 NOTE — Tx Team (Signed)
Interdisciplinary Treatment Plan Update   Date Reviewed:  07/25/2013  Time Reviewed:  9:38 AM  Progress in Treatment:   Attending groups: Yes Participating in groups: Yes Taking medication as prescribed: Yes  Tolerating medication: Yes Family/Significant other contact made: Yes, contact made with mother Patient understands diagnosis: Yes  Discussing patient identified problems/goals with staff: Yes Medical problems stabilized or resolved: Yes Denies suicidal/homicidal ideation: Yes Patient has not harmed self or others: Yes  For review of initial/current patient goals, please see plan of care.  Estimated Length of Stay:    Discharge today  Reasons for Continued Hospitalization:   New Problems/Goals identified:    Discharge Plan or Barriers:   Home with outpatient follow up [facility]  Additional Comments: N/A  Attendees:  Patient:  07/25/2013 9:38 AM   Signature: Mervyn Gay, MD 07/25/2013 9:38 AM  Signature:  Verne Spurr, PA 07/25/2013 9:38 AM  Signature: Liborio Nixon RN 07/25/2013 9:38 AM  Signature:  Nestor Ramp, RN 07/25/2013 9:38 AM  Signature:   07/25/2013 9:38 AM  Signature:  Juline Patch, LCSW 07/25/2013 9:38 AM  Signature:  Reyes Ivan, LCSW 07/25/2013 9:38 AM  Signature:  Sharin Grave Coordinator 07/25/2013 9:38 AM  Signature:  07/25/2013 9:38 AM  Signature: Leighton Parody, RN 07/25/2013  9:38 AM  Signature:    Signature:      Scribe for Treatment Team:   Juline Patch,  07/25/2013 9:38 AM
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
## 2013-07-29 NOTE — Progress Notes (Signed)
Patient Discharge Instructions:  Next Level Care Provider Has Access to the EMR, 07/29/13 Records provided to [facility] via [facility]/Epic access.  Jerelene Redden, 07/29/2013, 3:39 PM

## 2013-08-05 ENCOUNTER — Emergency Department (HOSPITAL_COMMUNITY)
Admission: EM | Admit: 2013-08-05 | Discharge: 2013-08-05 | Disposition: A | Discharge status: Home / Self Care | Payer: Medicare Other | Attending: Emergency Medicine | Admitting: Emergency Medicine

## 2013-08-05 ENCOUNTER — Encounter (HOSPITAL_COMMUNITY): Payer: Self-pay

## 2013-08-05 DIAGNOSIS — F1410 Cocaine abuse, uncomplicated: Secondary | ICD-10-CM | POA: Insufficient documentation

## 2013-08-05 DIAGNOSIS — F3289 Other specified depressive episodes: Secondary | ICD-10-CM | POA: Insufficient documentation

## 2013-08-05 DIAGNOSIS — Z8669 Personal history of other diseases of the nervous system and sense organs: Secondary | ICD-10-CM | POA: Insufficient documentation

## 2013-08-05 DIAGNOSIS — F329 Major depressive disorder, single episode, unspecified: Secondary | ICD-10-CM

## 2013-08-05 DIAGNOSIS — Z79899 Other long term (current) drug therapy: Secondary | ICD-10-CM | POA: Insufficient documentation

## 2013-08-05 DIAGNOSIS — Z87442 Personal history of urinary calculi: Secondary | ICD-10-CM | POA: Insufficient documentation

## 2013-08-05 DIAGNOSIS — F1910 Other psychoactive substance abuse, uncomplicated: Secondary | ICD-10-CM

## 2013-08-05 DIAGNOSIS — Z3202 Encounter for pregnancy test, result negative: Secondary | ICD-10-CM | POA: Insufficient documentation

## 2013-08-05 DIAGNOSIS — F411 Generalized anxiety disorder: Secondary | ICD-10-CM | POA: Insufficient documentation

## 2013-08-05 DIAGNOSIS — N189 Chronic kidney disease, unspecified: Secondary | ICD-10-CM | POA: Insufficient documentation

## 2013-08-05 HISTORY — DX: Other psychoactive substance abuse, uncomplicated: F19.10

## 2013-08-05 LAB — CBC
HCT: 36.8 % (ref 36.0–46.0)
MCH: 31 pg (ref 26.0–34.0)
MCV: 88.5 fL (ref 78.0–100.0)
Platelets: 203 10*3/uL (ref 150–400)
RDW: 12.9 % (ref 11.5–15.5)

## 2013-08-05 LAB — COMPREHENSIVE METABOLIC PANEL
AST: 13 U/L (ref 0–37)
Albumin: 3.4 g/dL — ABNORMAL LOW (ref 3.5–5.2)
BUN: 15 mg/dL (ref 6–23)
Calcium: 8.7 mg/dL (ref 8.4–10.5)
Creatinine, Ser: 0.74 mg/dL (ref 0.50–1.10)
GFR calc non Af Amer: >90 mL/min (ref >90)
Total Bilirubin: 0.4 mg/dL (ref 0.3–1.2)

## 2013-08-05 LAB — RAPID URINE DRUG SCREEN, HOSP PERFORMED
Barbiturates: NOT DETECTED
Benzodiazepines: NOT DETECTED
Cocaine: POSITIVE — AB
Opiates: NOT DETECTED
Tetrahydrocannabinol: NOT DETECTED

## 2013-08-05 MED ORDER — ACETAMINOPHEN 325 MG PO TABS
650.0000 mg | ORAL_TABLET | Freq: Once | ORAL | Status: AC
  Administered 2013-08-05: 650 mg via ORAL
  Filled 2013-08-05: qty 2

## 2013-08-05 NOTE — ED Notes (Signed)
Pt was seen here for SI and crack use and was sent to [facility]. Was discharged from [facility] 3 weeks ago and is still having SI. Last use of crack was a few hours prior to coming in. Pt also reports a HA since being released and was started on Prozac and Trazodone. Pt denies having a plan only thoughts.
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
## 2013-08-07 ENCOUNTER — Ambulatory Visit (INDEPENDENT_AMBULATORY_CARE_PROVIDER_SITE_OTHER): Payer: Medicare Other | Admitting: Licensed Clinical Social Worker

## 2013-08-07 ENCOUNTER — Encounter (HOSPITAL_COMMUNITY): Payer: Self-pay | Admitting: Licensed Clinical Social Worker

## 2013-08-07 DIAGNOSIS — F1410 Cocaine abuse, uncomplicated: Secondary | ICD-10-CM

## 2013-08-07 DIAGNOSIS — F332 Major depressive disorder, recurrent severe without psychotic features: Secondary | ICD-10-CM
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
## 2013-08-15 ENCOUNTER — Emergency Department (HOSPITAL_COMMUNITY)
Admission: EM | Admit: 2013-08-15 | Discharge: 2013-08-15 | Disposition: A | Discharge status: Home / Self Care | Payer: Medicare Other | Attending: Emergency Medicine | Admitting: Emergency Medicine

## 2013-08-15 ENCOUNTER — Encounter (HOSPITAL_COMMUNITY): Payer: Self-pay | Admitting: Emergency Medicine

## 2013-08-15 ENCOUNTER — Emergency Department (HOSPITAL_COMMUNITY): Payer: Medicare Other

## 2013-08-15 DIAGNOSIS — F411 Generalized anxiety disorder: Secondary | ICD-10-CM | POA: Insufficient documentation

## 2013-08-15 DIAGNOSIS — N189 Chronic kidney disease, unspecified: Secondary | ICD-10-CM | POA: Diagnosis not present

## 2013-08-15 DIAGNOSIS — Z87442 Personal history of urinary calculi: Secondary | ICD-10-CM | POA: Insufficient documentation

## 2013-08-15 DIAGNOSIS — Z8669 Personal history of other diseases of the nervous system and sense organs: Secondary | ICD-10-CM | POA: Diagnosis not present

## 2013-08-15 DIAGNOSIS — R079 Chest pain, unspecified: Secondary | ICD-10-CM | POA: Diagnosis not present

## 2013-08-15 DIAGNOSIS — J209 Acute bronchitis, unspecified: Secondary | ICD-10-CM | POA: Diagnosis not present

## 2013-08-15 DIAGNOSIS — F329 Major depressive disorder, single episode, unspecified: Secondary | ICD-10-CM | POA: Insufficient documentation

## 2013-08-15 DIAGNOSIS — F3289 Other specified depressive episodes: Secondary | ICD-10-CM | POA: Insufficient documentation

## 2013-08-15 DIAGNOSIS — J40 Bronchitis, not specified as acute or chronic: Secondary | ICD-10-CM | POA: Diagnosis not present

## 2013-08-15 DIAGNOSIS — Z79899 Other long term (current) drug therapy: Secondary | ICD-10-CM | POA: Insufficient documentation

## 2013-08-15 MED ORDER — PREDNISONE 20 MG PO TABS: 40.0000 mg | ORAL_TABLET | Freq: Every day | ORAL | Status: DC

## 2013-08-15 MED ORDER — BENZONATATE 100 MG PO CAPS: 200.0000 mg | ORAL_CAPSULE | Freq: Two times a day (BID) | ORAL | Status: DC | PRN

## 2013-08-15 MED ORDER — ALBUTEROL SULFATE HFA 108 (90 BASE) MCG/ACT IN AERS
2.0000 | INHALATION_SPRAY | RESPIRATORY_TRACT | Status: DC | PRN
  Administered 2013-08-15: 2 via RESPIRATORY_TRACT
  Filled 2013-08-15: qty 6.7
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
## 2013-08-15 NOTE — ED Notes (Signed)
Pt reports productive cough, congestion, "not feeling well" for past month then since yesterday she began to have a fever. Taking ibuprofen with minimal relief

## 2013-08-17 NOTE — ED Provider Notes (Signed)
Medical screening examination/treatment/procedure(s) were performed by non-physician practitioner and as supervising physician I was immediately available for consultation/collaboration.    Christopher J. Pollina, MD 08/17/13 1557 

## 2013-08-19 ENCOUNTER — Encounter (HOSPITAL_COMMUNITY): Payer: Self-pay | Admitting: Physician Assistant

## 2013-08-19 ENCOUNTER — Encounter (INDEPENDENT_AMBULATORY_CARE_PROVIDER_SITE_OTHER): Payer: Self-pay

## 2013-08-19 ENCOUNTER — Ambulatory Visit (INDEPENDENT_AMBULATORY_CARE_PROVIDER_SITE_OTHER): Payer: Medicare Other | Admitting: Physician Assistant

## 2013-08-19 VITALS — BP 112/70 | HR 88 | Ht 67.0 in | Wt 171.4 lb

## 2013-08-19 DIAGNOSIS — F332 Major depressive disorder, recurrent severe without psychotic features: Secondary | ICD-10-CM

## 2013-08-19 DIAGNOSIS — F331 Major depressive disorder, recurrent, moderate: Secondary | ICD-10-CM | POA: Diagnosis not present

## 2013-08-19 NOTE — Progress Notes (Signed)
   [facility] Follow-up Outpatient Visit  Deicy S Paschal [birth year]  Date: 08/19/2013   Subjective: Yesika presents today to followup on her treatment for depression. She was recently hospitalized on the adult unit at [facility]. According to the record, Abbigail had begun smoking crack cocaine twice weekly for a period of 4 or more months, and her mood be compensated and she presented to the emergency department with suicidal thoughts. She reports that her depression is much improved now. She denies any suicidal thoughts. She is having difficulty both initiating and maintaining sleep. She complains that she is gaining weight on the Prozac that she was discharged on. She has begun seeing Geanie Berlin, and has an appointment to return in approximately 3 weeks.  She is currently suffering from a severe case of bronchitis, and has been put on prednisone 40 mg twice daily, which is probably the cause of her weight gain.  Filed Vitals:   08/19/13 1321  BP: 112/70  Pulse: 88    Mental Status Examination  Appearance: Casual Alert: Yes Attention: good  Cooperative: Yes Eye Contact: Good Speech: Clear and coherent Psychomotor Activity: Normal Memory/Concentration: Intact Oriented: person, place, time/date and situation Mood: Dysphoric Affect: Congruent Thought Processes and Associations: Linear Fund of Knowledge: Fair Thought Content: Normal Insight: Fair Judgement: Fair  Diagnosis: Maj. depressive disorder, recurrent, moderate  Treatment Plan: We will continue her medications per discharge from the inpatient unit including Prozac 20 mg daily, and trazodone 200 mg at bedtime. She will return for a followup visit in one month.  Ryna Beckstrom, PA-C

## 2013-08-20 ENCOUNTER — Emergency Department (HOSPITAL_COMMUNITY)
Admission: EM | Admit: 2013-08-20 | Discharge: 2013-08-20 | Disposition: A | Discharge status: Home / Self Care | Payer: Medicare Other | Attending: Emergency Medicine | Admitting: Emergency Medicine

## 2013-08-20 ENCOUNTER — Emergency Department (HOSPITAL_COMMUNITY): Payer: Medicare Other

## 2013-08-20 ENCOUNTER — Encounter (HOSPITAL_COMMUNITY): Payer: Self-pay | Admitting: Emergency Medicine

## 2013-08-20 DIAGNOSIS — Z8669 Personal history of other diseases of the nervous system and sense organs: Secondary | ICD-10-CM | POA: Insufficient documentation

## 2013-08-20 DIAGNOSIS — J159 Unspecified bacterial pneumonia: Secondary | ICD-10-CM | POA: Diagnosis not present

## 2013-08-20 DIAGNOSIS — J189 Pneumonia, unspecified organism: Secondary | ICD-10-CM

## 2013-08-20 DIAGNOSIS — Z79899 Other long term (current) drug therapy: Secondary | ICD-10-CM | POA: Insufficient documentation

## 2013-08-20 DIAGNOSIS — J029 Acute pharyngitis, unspecified: Secondary | ICD-10-CM | POA: Diagnosis not present

## 2013-08-20 DIAGNOSIS — J988 Other specified respiratory disorders: Secondary | ICD-10-CM | POA: Diagnosis not present

## 2013-08-20 DIAGNOSIS — IMO0002 Reserved for concepts with insufficient information to code with codable children: Secondary | ICD-10-CM | POA: Diagnosis not present

## 2013-08-20 DIAGNOSIS — Z87442 Personal history of urinary calculi: Secondary | ICD-10-CM | POA: Diagnosis not present

## 2013-08-20 DIAGNOSIS — F411 Generalized anxiety disorder: Secondary | ICD-10-CM | POA: Insufficient documentation

## 2013-08-20 DIAGNOSIS — N189 Chronic kidney disease, unspecified: Secondary | ICD-10-CM | POA: Diagnosis not present

## 2013-08-20 DIAGNOSIS — F329 Major depressive disorder, single episode, unspecified: Secondary | ICD-10-CM | POA: Diagnosis not present

## 2013-08-20 DIAGNOSIS — F3289 Other specified depressive episodes: Secondary | ICD-10-CM | POA: Insufficient documentation

## 2013-08-20 LAB — RAPID STREP SCREEN (MED CTR MEBANE ONLY): Streptococcus, Group A Screen (Direct): NEGATIVE

## 2013-08-20 MED ORDER — GUAIFENESIN ER 1200 MG PO TB12: 1.0000 | ORAL_TABLET | Freq: Two times a day (BID) | ORAL | Status: DC

## 2013-08-20 MED ORDER — ACETAMINOPHEN-CODEINE 120-12 MG/5ML PO SOLN
10.0000 mL | Freq: Once | ORAL | Status: AC
  Administered 2013-08-20: 10 mL via ORAL
  Filled 2013-08-20: qty 10

## 2013-08-20 MED ORDER — ACETAMINOPHEN-CODEINE 120-12 MG/5ML PO SOLN: 10.0000 mL | ORAL | Status: DC | PRN

## 2013-08-20 MED ORDER — SODIUM CHLORIDE 0.9 % IV BOLUS (SEPSIS)
1000.0000 mL | Freq: Once | INTRAVENOUS | Status: AC
  Administered 2013-08-20: 1000 mL via INTRAVENOUS

## 2013-08-20 MED ORDER — DEXTROSE 5 % IV SOLN
1.0000 g | Freq: Once | INTRAVENOUS | Status: AC
  Administered 2013-08-20: 1 g via INTRAVENOUS
  Filled 2013-08-20: qty 10

## 2013-08-20 MED ORDER — AZITHROMYCIN 250 MG PO TABS: ORAL_TABLET | ORAL | Status: DC

## 2013-08-20 MED ORDER — IBUPROFEN 800 MG PO TABS: 800.0000 mg | ORAL_TABLET | Freq: Three times a day (TID) | ORAL | Status: DC | PRN

## 2013-08-20 NOTE — ED Notes (Signed)
Ambulated pt while monitoring pulse oximetry. Pulse 80, O2 97%

## 2013-08-20 NOTE — ED Notes (Signed)
Pt reports she has had cough and trouble breathing for one month.  Has been unable to sleep due to cough and "feels like my throat is closing up"

## 2013-08-20 NOTE — ED Notes (Signed)
Pt sts the pain medicine really helped her and wants to take some home with her. Will notify PA.
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
## 2013-08-22 LAB — CULTURE, GROUP A STREP

## 2013-08-23 NOTE — ED Provider Notes (Signed)
Medical screening examination/treatment/procedure(s) were conducted as a shared visit with non-physician practitioner(s) and myself.  I personally evaluated the patient during the encounter.  Cough and dyspnea for greater than one week.  Chest x-ray shows left upper lobe and left lower lobe pneumonia.   Rx for community-acquired pneumonia. Patient is hemodynamically stable.  Donnetta Hutching, MD 08/23/13 [phone]

## 2013-09-01 ENCOUNTER — Other Ambulatory Visit (HOSPITAL_COMMUNITY): Payer: Self-pay | Admitting: Physician Assistant

## 2013-09-03 ENCOUNTER — Telehealth (HOSPITAL_COMMUNITY): Payer: Self-pay | Admitting: *Deleted

## 2013-09-03 DIAGNOSIS — F332 Major depressive disorder, recurrent severe without psychotic features: Secondary | ICD-10-CM

## 2013-09-03 MED ORDER — FLUOXETINE HCL 20 MG PO CAPS: 20.0000 mg | ORAL_CAPSULE | Freq: Every day | ORAL | Status: DC

## 2013-09-03 NOTE — Telephone Encounter (Signed)
Medication refilled

## 2013-09-09 ENCOUNTER — Encounter (INDEPENDENT_AMBULATORY_CARE_PROVIDER_SITE_OTHER): Payer: Self-pay

## 2013-09-09 ENCOUNTER — Ambulatory Visit (INDEPENDENT_AMBULATORY_CARE_PROVIDER_SITE_OTHER): Payer: Medicare Other | Admitting: Licensed Clinical Social Worker

## 2013-09-09 DIAGNOSIS — F332 Major depressive disorder, recurrent severe without psychotic features: Secondary | ICD-10-CM
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
## 2013-09-11 ENCOUNTER — Telehealth (HOSPITAL_COMMUNITY): Payer: Self-pay | Admitting: *Deleted

## 2013-09-11 NOTE — Telephone Encounter (Signed)
Pt states she saw a neurologist 3 years ago for a cyst in her brain.She has blurry vision and dizziness returning and want to see the neurologist again, but they require a referral. Advised pt to seek referral from primary MD, but pt states she sees Dr.Arfeen next week and has not seen primary MD in a while. Pt states she wants to discuss this referral when she sees MD, but wanted him to now about it first. Advised pt that MD would receive note regarding this conversation.

## 2013-09-17 ENCOUNTER — Ambulatory Visit (INDEPENDENT_AMBULATORY_CARE_PROVIDER_SITE_OTHER): Payer: Medicare Other | Admitting: Psychiatry

## 2013-09-17 ENCOUNTER — Encounter (HOSPITAL_COMMUNITY): Payer: Self-pay | Admitting: *Deleted

## 2013-09-17 ENCOUNTER — Encounter (HOSPITAL_COMMUNITY): Payer: Self-pay | Admitting: Psychiatry

## 2013-09-17 VITALS — BP 109/70 | HR 61 | Ht 67.0 in | Wt 171.2 lb

## 2013-09-17 DIAGNOSIS — F29 Unspecified psychosis not due to a substance or known physiological condition: Secondary | ICD-10-CM | POA: Diagnosis not present

## 2013-09-17 DIAGNOSIS — F329 Major depressive disorder, single episode, unspecified: Secondary | ICD-10-CM

## 2013-09-17 DIAGNOSIS — F332 Major depressive disorder, recurrent severe without psychotic features: Secondary | ICD-10-CM

## 2013-09-17 DIAGNOSIS — F331 Major depressive disorder, recurrent, moderate: Secondary | ICD-10-CM

## 2013-09-17 MED ORDER — BUSPIRONE HCL 5 MG PO TABS: 5.0000 mg | ORAL_TABLET | Freq: Two times a day (BID) | ORAL | Status: DC

## 2013-09-17 MED ORDER — QUETIAPINE FUMARATE 50 MG PO TABS: 50.0000 mg | ORAL_TABLET | Freq: Every day | ORAL | Status: DC

## 2013-09-17 MED ORDER — FLUOXETINE HCL 20 MG PO CAPS: 20.0000 mg | ORAL_CAPSULE | Freq: Every day | ORAL | Status: DC
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
## 2013-10-06 ENCOUNTER — Other Ambulatory Visit (HOSPITAL_COMMUNITY): Payer: Self-pay | Admitting: Psychiatry

## 2013-10-07 ENCOUNTER — Other Ambulatory Visit (HOSPITAL_COMMUNITY): Payer: Self-pay | Admitting: Psychiatry

## 2013-10-07 ENCOUNTER — Encounter: Payer: Self-pay | Admitting: Neurology

## 2013-10-08 ENCOUNTER — Encounter (INDEPENDENT_AMBULATORY_CARE_PROVIDER_SITE_OTHER): Payer: Self-pay

## 2013-10-08 ENCOUNTER — Encounter: Payer: Self-pay | Admitting: Neurology

## 2013-10-08 ENCOUNTER — Ambulatory Visit (INDEPENDENT_AMBULATORY_CARE_PROVIDER_SITE_OTHER): Payer: Medicare Other | Admitting: Neurology

## 2013-10-08 VITALS — BP 109/67 | HR 75 | Ht 67.5 in | Wt 176.0 lb

## 2013-10-08 DIAGNOSIS — G43019 Migraine without aura, intractable, without status migrainosus: Secondary | ICD-10-CM | POA: Diagnosis not present

## 2013-10-08 DIAGNOSIS — R42 Dizziness and giddiness: Secondary | ICD-10-CM | POA: Insufficient documentation

## 2013-10-08 HISTORY — DX: Migraine without aura, intractable, without status migrainosus: G43.019

## 2013-10-08 MED ORDER — TOPIRAMATE 25 MG PO TABS: ORAL_TABLET | ORAL | Status: DC
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
## 2013-10-08 NOTE — Patient Instructions (Signed)
   Topamax (topiramate) is a seizure medication that has an FDA approval for seizures and for migraine headache. Potential side effects of this medication include weight loss, cognitive slowing, tingling in the fingers and toes, and carbonated drinks will taste bad. If any significant side effects are noted on this drug, please contact our office.  

## 2013-10-14 ENCOUNTER — Ambulatory Visit (HOSPITAL_COMMUNITY): Payer: Self-pay | Admitting: Licensed Clinical Social Worker

## 2013-10-14 ENCOUNTER — Encounter (HOSPITAL_COMMUNITY): Payer: Self-pay | Admitting: Licensed Clinical Social Worker

## 2013-10-14 NOTE — Progress Notes (Signed)
Patient ID: Rebecca Andrade, female   DOB: 1971-11-06, 42 y.o.   MRN: [id number] Patient was a no show no call.

## 2013-10-17 ENCOUNTER — Ambulatory Visit (HOSPITAL_COMMUNITY): Payer: Self-pay | Admitting: Psychiatry

## 2013-10-20 ENCOUNTER — Encounter (HOSPITAL_COMMUNITY): Payer: Self-pay | Admitting: Psychiatry

## 2013-10-20 ENCOUNTER — Ambulatory Visit (INDEPENDENT_AMBULATORY_CARE_PROVIDER_SITE_OTHER): Payer: Medicare Other | Admitting: Psychiatry

## 2013-10-20 VITALS — BP 111/76 | HR 81 | Ht 66.93 in | Wt 175.0 lb

## 2013-10-20 DIAGNOSIS — F332 Major depressive disorder, recurrent severe without psychotic features: Secondary | ICD-10-CM

## 2013-10-20 DIAGNOSIS — F323 Major depressive disorder, single episode, severe with psychotic features: Secondary | ICD-10-CM | POA: Diagnosis not present

## 2013-10-20 DIAGNOSIS — F331 Major depressive disorder, recurrent, moderate: Secondary | ICD-10-CM

## 2013-10-20 MED ORDER — BUSPIRONE HCL 5 MG PO TABS: ORAL_TABLET | ORAL | Status: DC

## 2013-10-20 MED ORDER — FLUOXETINE HCL 20 MG PO CAPS: 20.0000 mg | ORAL_CAPSULE | Freq: Every day | ORAL | Status: DC

## 2013-10-20 MED ORDER — QUETIAPINE FUMARATE 100 MG PO TABS: ORAL_TABLET | ORAL | Status: DC
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
## 2013-10-23 ENCOUNTER — Other Ambulatory Visit: Payer: Self-pay | Admitting: Neurology

## 2013-10-23 DIAGNOSIS — E348 Other specified endocrine disorders: Secondary | ICD-10-CM

## 2013-11-11 ENCOUNTER — Ambulatory Visit (HOSPITAL_COMMUNITY): Payer: Self-pay | Admitting: Licensed Clinical Social Worker

## 2013-11-11 ENCOUNTER — Telehealth: Payer: Self-pay | Admitting: Neurology

## 2013-11-11 ENCOUNTER — Ambulatory Visit
Admission: RE | Admit: 2013-11-11 | Discharge: 2013-11-11 | Disposition: A | Discharge status: Home / Self Care | Payer: Medicare Other | Source: Ambulatory Visit | Attending: Neurology | Admitting: Neurology

## 2013-11-11 DIAGNOSIS — E348 Other specified endocrine disorders: Secondary | ICD-10-CM

## 2013-11-11 NOTE — Telephone Encounter (Signed)
I called the patient. The MRI of the brain is unchanged from 2008. The blurring of vision is likely related to migraine. The pineal cyst is unchanged. I discussed this with her.

## 2013-11-13 ENCOUNTER — Telehealth: Payer: Self-pay | Admitting: Neurology

## 2013-11-13 MED ORDER — TOPIRAMATE 100 MG PO TABS: 100.0000 mg | ORAL_TABLET | Freq: Every day | ORAL | Status: DC

## 2013-11-13 NOTE — Telephone Encounter (Signed)
NEEDS TO DISCUSS MRI RESULTS--GOT CUT OFF

## 2013-11-13 NOTE — Telephone Encounter (Signed)
I called the patient. The patient indicates that she is still having frequent headaches. The patient is on Topamax 50 mg at night. She is tolerating the medication well, so we will increase the dose. The patient is to go to 75 mg nightly until she uses up to 25 mg tablets, then we will convert to the 100 mg tablets at night. In the future, increasing the Seroquel at night may be beneficial.

## 2013-11-13 NOTE — Telephone Encounter (Signed)
Shared Dr Clarisa KindredWillis's message with patient, she said that she is still having migraines and the topiramate is not helping that much, wanted to let him know.

## 2013-12-04 ENCOUNTER — Emergency Department (HOSPITAL_COMMUNITY): Payer: Medicare Other

## 2013-12-04 ENCOUNTER — Encounter (HOSPITAL_COMMUNITY): Payer: Self-pay | Admitting: Emergency Medicine

## 2013-12-04 ENCOUNTER — Emergency Department (HOSPITAL_COMMUNITY)
Admission: EM | Admit: 2013-12-04 | Discharge: 2013-12-04 | Disposition: A | Discharge status: Home / Self Care | Payer: Medicare Other | Attending: Emergency Medicine | Admitting: Emergency Medicine

## 2013-12-04 DIAGNOSIS — N189 Chronic kidney disease, unspecified: Secondary | ICD-10-CM | POA: Insufficient documentation

## 2013-12-04 DIAGNOSIS — F3289 Other specified depressive episodes: Secondary | ICD-10-CM | POA: Diagnosis not present

## 2013-12-04 DIAGNOSIS — Z862 Personal history of diseases of the blood and blood-forming organs and certain disorders involving the immune mechanism: Secondary | ICD-10-CM | POA: Diagnosis not present

## 2013-12-04 DIAGNOSIS — R1110 Vomiting, unspecified: Secondary | ICD-10-CM | POA: Diagnosis not present

## 2013-12-04 DIAGNOSIS — B349 Viral infection, unspecified: Secondary | ICD-10-CM

## 2013-12-04 DIAGNOSIS — Z8669 Personal history of other diseases of the nervous system and sense organs: Secondary | ICD-10-CM | POA: Insufficient documentation

## 2013-12-04 DIAGNOSIS — G43019 Migraine without aura, intractable, without status migrainosus: Secondary | ICD-10-CM | POA: Diagnosis not present

## 2013-12-04 DIAGNOSIS — Z3202 Encounter for pregnancy test, result negative: Secondary | ICD-10-CM | POA: Insufficient documentation

## 2013-12-04 DIAGNOSIS — F411 Generalized anxiety disorder: Secondary | ICD-10-CM | POA: Diagnosis not present

## 2013-12-04 DIAGNOSIS — Z87442 Personal history of urinary calculi: Secondary | ICD-10-CM | POA: Diagnosis not present

## 2013-12-04 DIAGNOSIS — Z79899 Other long term (current) drug therapy: Secondary | ICD-10-CM | POA: Diagnosis not present

## 2013-12-04 DIAGNOSIS — R05 Cough: Secondary | ICD-10-CM | POA: Diagnosis not present

## 2013-12-04 DIAGNOSIS — Z8639 Personal history of other endocrine, nutritional and metabolic disease: Secondary | ICD-10-CM | POA: Insufficient documentation

## 2013-12-04 DIAGNOSIS — B9789 Other viral agents as the cause of diseases classified elsewhere: Secondary | ICD-10-CM | POA: Diagnosis not present

## 2013-12-04 DIAGNOSIS — F329 Major depressive disorder, single episode, unspecified: Secondary | ICD-10-CM | POA: Insufficient documentation

## 2013-12-04 DIAGNOSIS — R059 Cough, unspecified: Secondary | ICD-10-CM | POA: Diagnosis not present

## 2013-12-04 DIAGNOSIS — B338 Other specified viral diseases: Secondary | ICD-10-CM | POA: Diagnosis not present

## 2013-12-04 LAB — URINALYSIS, ROUTINE W REFLEX MICROSCOPIC
Bilirubin Urine: NEGATIVE
Glucose, UA: NEGATIVE mg/dL
Ketones, ur: NEGATIVE mg/dL
Leukocytes, UA: NEGATIVE
Nitrite: NEGATIVE
Protein, ur: NEGATIVE mg/dL
Specific Gravity, Urine: 1.008 (ref 1.005–1.030)
Urobilinogen, UA: 0.2 mg/dL (ref 0.0–1.0)
pH: 7 (ref 5.0–8.0)

## 2013-12-04 LAB — CBC WITH DIFFERENTIAL/PLATELET
Basophils Absolute: 0.1 10*3/uL (ref 0.0–0.1)
Basophils Relative: 1 % (ref 0–1)
Eosinophils Absolute: 0.3 10*3/uL (ref 0.0–0.7)
Eosinophils Relative: 4 % (ref 0–5)
HCT: 41 % (ref 36.0–46.0)
Hemoglobin: 14.1 g/dL (ref 12.0–15.0)
Lymphocytes Relative: 32 % (ref 12–46)
Lymphs Abs: 1.9 10*3/uL (ref 0.7–4.0)
MCH: 31.5 pg (ref 26.0–34.0)
MCHC: 34.4 g/dL (ref 30.0–36.0)
MCV: 91.5 fL (ref 78.0–100.0)
Monocytes Absolute: 0.9 10*3/uL (ref 0.1–1.0)
Monocytes Relative: 16 % — ABNORMAL HIGH (ref 3–12)
Neutro Abs: 2.8 10*3/uL (ref 1.7–7.7)
Neutrophils Relative %: 47 % (ref 43–77)
Platelets: 243 10*3/uL (ref 150–400)
RBC: 4.48 MIL/uL (ref 3.87–5.11)
RDW: 12.7 % (ref 11.5–15.5)
WBC: 6 10*3/uL (ref 4.0–10.5)

## 2013-12-04 LAB — COMPREHENSIVE METABOLIC PANEL
ALT: 11 U/L (ref 0–35)
AST: 15 U/L (ref 0–37)
Albumin: 3.4 g/dL — ABNORMAL LOW (ref 3.5–5.2)
Alkaline Phosphatase: 86 U/L (ref 39–117)
BUN: 10 mg/dL (ref 6–23)
CO2: 23 mEq/L (ref 19–32)
Calcium: 8.6 mg/dL (ref 8.4–10.5)
Chloride: 103 mEq/L (ref 96–112)
Creatinine, Ser: 0.86 mg/dL (ref 0.50–1.10)
GFR calc Af Amer: >90 mL/min (ref >90)
GFR calc non Af Amer: 82 mL/min — ABNORMAL LOW (ref >90)
Glucose, Bld: 93 mg/dL (ref 70–99)
Potassium: 4 mEq/L (ref 3.7–5.3)
Sodium: 139 mEq/L (ref 137–147)
Total Bilirubin: <0.2 mg/dL — ABNORMAL LOW (ref 0.3–1.2)
Total Protein: 7 g/dL (ref 6.0–8.3)

## 2013-12-04 LAB — URINE MICROSCOPIC-ADD ON

## 2013-12-04 LAB — LIPASE, BLOOD: Lipase: 38 U/L (ref 11–59)

## 2013-12-04 LAB — POCT PREGNANCY, URINE: Preg Test, Ur: NEGATIVE

## 2013-12-04 MED ORDER — GUAIFENESIN 100 MG/5ML PO LIQD: 100.0000 mg | ORAL | Status: DC | PRN

## 2013-12-04 MED ORDER — HYDROCODONE-ACETAMINOPHEN 7.5-325 MG/15ML PO SOLN: 10.0000 mL | Freq: Four times a day (QID) | ORAL | Status: DC | PRN

## 2013-12-04 NOTE — Discharge Instructions (Signed)

## 2013-12-04 NOTE — ED Notes (Signed)
Pt reports having productive cough x 2-3 weeks. Now having flu like symptoms x 4 days, fever, chills, sore throat, headache and n/v. Mask on pt at triage. Airway intact.
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
## 2013-12-04 NOTE — ED Notes (Signed)
PA at bedside.

## 2013-12-05 NOTE — ED Provider Notes (Signed)
Medical screening examination/treatment/procedure(s) were performed by non-physician practitioner and as supervising physician I was immediately available for consultation/collaboration.  EKG Interpretation   None        Raeford RazorStephen Kenna Kirn, MD 12/05/13 0040

## 2013-12-10 ENCOUNTER — Encounter (HOSPITAL_COMMUNITY): Payer: Self-pay | Admitting: Licensed Clinical Social Worker

## 2013-12-10 ENCOUNTER — Ambulatory Visit (HOSPITAL_COMMUNITY): Payer: Self-pay | Admitting: Licensed Clinical Social Worker

## 2013-12-10 NOTE — Progress Notes (Signed)
Patient ID: Rebecca Andrade, female   DOB: 09/30/1971, 43 y.o.   MRN: [id number] Patient is a no show no call. This is patients last scheduled appointment. Will send a letter to close the case to thearpy due to multiple no shows.

## 2013-12-16 ENCOUNTER — Other Ambulatory Visit (HOSPITAL_COMMUNITY): Payer: Self-pay | Admitting: Psychiatry

## 2013-12-21 ENCOUNTER — Other Ambulatory Visit (HOSPITAL_COMMUNITY): Payer: Self-pay | Admitting: Psychiatry

## 2013-12-21 NOTE — Telephone Encounter (Signed)
Patient has appointment on 12/22/13.

## 2013-12-22 ENCOUNTER — Encounter (HOSPITAL_COMMUNITY): Payer: Self-pay | Admitting: Psychiatry

## 2013-12-22 ENCOUNTER — Ambulatory Visit (INDEPENDENT_AMBULATORY_CARE_PROVIDER_SITE_OTHER): Payer: Medicare Other | Admitting: Psychiatry

## 2013-12-22 VITALS — BP 111/68 | HR 78 | Wt 178.0 lb

## 2013-12-22 DIAGNOSIS — F323 Major depressive disorder, single episode, severe with psychotic features: Secondary | ICD-10-CM

## 2013-12-22 DIAGNOSIS — F332 Major depressive disorder, recurrent severe without psychotic features: Secondary | ICD-10-CM

## 2013-12-22 MED ORDER — QUETIAPINE FUMARATE 100 MG PO TABS: 100.0000 mg | ORAL_TABLET | Freq: Every day | ORAL | Status: DC

## 2013-12-22 MED ORDER — BUSPIRONE HCL 5 MG PO TABS: 5.0000 mg | ORAL_TABLET | Freq: Three times a day (TID) | ORAL | Status: DC

## 2013-12-22 MED ORDER — FLUOXETINE HCL 20 MG PO CAPS: 20.0000 mg | ORAL_CAPSULE | Freq: Every day | ORAL | Status: DC
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
## 2014-01-31 ENCOUNTER — Encounter (HOSPITAL_COMMUNITY): Payer: Self-pay | Admitting: Emergency Medicine

## 2014-01-31 ENCOUNTER — Emergency Department (HOSPITAL_COMMUNITY)
Admission: EM | Admit: 2014-01-31 | Discharge: 2014-01-31 | Disposition: A | Discharge status: Home Health | Payer: Medicare Other | Attending: Emergency Medicine | Admitting: Emergency Medicine

## 2014-01-31 ENCOUNTER — Emergency Department (HOSPITAL_COMMUNITY): Payer: Medicare Other

## 2014-01-31 DIAGNOSIS — F3289 Other specified depressive episodes: Secondary | ICD-10-CM | POA: Insufficient documentation

## 2014-01-31 DIAGNOSIS — M25561 Pain in right knee: Secondary | ICD-10-CM

## 2014-01-31 DIAGNOSIS — M25469 Effusion, unspecified knee: Secondary | ICD-10-CM | POA: Diagnosis not present

## 2014-01-31 DIAGNOSIS — Z8659 Personal history of other mental and behavioral disorders: Secondary | ICD-10-CM | POA: Diagnosis not present

## 2014-01-31 DIAGNOSIS — M25569 Pain in unspecified knee: Secondary | ICD-10-CM | POA: Insufficient documentation

## 2014-01-31 DIAGNOSIS — G43019 Migraine without aura, intractable, without status migrainosus: Secondary | ICD-10-CM | POA: Diagnosis not present

## 2014-01-31 DIAGNOSIS — Z862 Personal history of diseases of the blood and blood-forming organs and certain disorders involving the immune mechanism: Secondary | ICD-10-CM | POA: Insufficient documentation

## 2014-01-31 DIAGNOSIS — F411 Generalized anxiety disorder: Secondary | ICD-10-CM | POA: Insufficient documentation

## 2014-01-31 DIAGNOSIS — Z8639 Personal history of other endocrine, nutritional and metabolic disease: Secondary | ICD-10-CM | POA: Insufficient documentation

## 2014-01-31 DIAGNOSIS — N189 Chronic kidney disease, unspecified: Secondary | ICD-10-CM | POA: Diagnosis not present

## 2014-01-31 DIAGNOSIS — R209 Unspecified disturbances of skin sensation: Secondary | ICD-10-CM | POA: Insufficient documentation

## 2014-01-31 DIAGNOSIS — Z87442 Personal history of urinary calculi: Secondary | ICD-10-CM | POA: Diagnosis not present

## 2014-01-31 DIAGNOSIS — Z79899 Other long term (current) drug therapy: Secondary | ICD-10-CM | POA: Insufficient documentation

## 2014-01-31 DIAGNOSIS — F329 Major depressive disorder, single episode, unspecified: Secondary | ICD-10-CM | POA: Insufficient documentation

## 2014-01-31 DIAGNOSIS — Z87828 Personal history of other (healed) physical injury and trauma: Secondary | ICD-10-CM | POA: Insufficient documentation

## 2014-01-31 DIAGNOSIS — Z8669 Personal history of other diseases of the nervous system and sense organs: Secondary | ICD-10-CM | POA: Insufficient documentation

## 2014-01-31 MED ORDER — HYDROCODONE-ACETAMINOPHEN 5-325 MG PO TABS: 1.0000 | ORAL_TABLET | ORAL | Status: DC | PRN

## 2014-01-31 MED ORDER — IBUPROFEN 800 MG PO TABS: 800.0000 mg | ORAL_TABLET | Freq: Three times a day (TID) | ORAL | Status: DC

## 2014-01-31 MED ORDER — HYDROCODONE-ACETAMINOPHEN 5-325 MG PO TABS
2.0000 | ORAL_TABLET | Freq: Once | ORAL | Status: AC
  Administered 2014-01-31: 2 via ORAL
  Filled 2014-01-31: qty 2

## 2014-01-31 NOTE — ED Notes (Signed)
She c/o R knee pain and swelling x 3 days. States she cant sleep at night because it hurts so bad

## 2014-01-31 NOTE — Discharge Instructions (Signed)
Take Ibuprofen for pain and swelling - take with food  Take Vicodin for severe pain - Please be careful with this medication.  It can cause drowsiness.  Use caution while driving, operating machinery, drinking alcohol, or any other activities that may impair your physical or mental abilities.   Use the RICE method - see below  Return to the emergency department if you develop any changing/worsening condition, red hot swollen joint, swelling, fever, not feeling well, weakness, loss of sensation, numbness or any other concerns (please read additional information regarding your condition below)    Knee Pain Knee pain can be a result of an injury or other medical conditions. Treatment will depend on the cause of your pain. HOME CARE  Only take medicine as told by your doctor.  Keep a healthy weight. Being overweight can make the knee hurt more.  Stretch before exercising or playing sports.  If there is constant knee pain, change the way you exercise. Ask your doctor for advice.  Make sure shoes fit well. Choose the right shoe for the sport or activity.  Protect your knees. Wear kneepads if needed.  Rest when you are tired. GET HELP RIGHT AWAY IF:   Your knee pain does not stop.  Your knee pain does not get better.  Your knee joint feels hot to the touch.  You have a fever. MAKE SURE YOU:   Understand these instructions.  Will watch this condition.  Will get help right away if you are not doing well or get worse. Document Released: 01/19/2009 Document Revised: 01/15/2012 Document Reviewed: 01/19/2009 [facility] Patient Information 2014 [place], [place].  RICE: Routine Care for Injuries The routine care of many injuries includes Rest, Ice, Compression, and Elevation (RICE). HOME CARE INSTRUCTIONS  Rest is needed to allow your body to heal. Routine activities can usually be resumed when comfortable. Injured tendons and bones can take up to 6 weeks to heal. Tendons are the  cord-like structures that attach muscle to bone.  Ice following an injury helps keep the swelling down and reduces pain.  Put ice in a plastic bag.  Place a towel between your skin and the bag.  Leave the ice on for 15-20 minutes, 03-04 times a day. Do this while awake, for the first 24 to 48 hours. After that, continue as directed by your caregiver.  Compression helps keep swelling down. It also gives support and helps with discomfort. If an elastic bandage has been applied, it should be removed and reapplied every 3 to 4 hours. It should not be applied tightly, but firmly enough to keep swelling down. Watch fingers or toes for swelling, bluish discoloration, coldness, numbness, or excessive pain. If any of these problems occur, remove the bandage and reapply loosely. Contact your caregiver if these problems continue.  Elevation helps reduce swelling and decreases pain. With extremities, such as the arms, hands, legs, and feet, the injured area should be placed near or above the level of the heart, if possible. SEEK IMMEDIATE MEDICAL CARE IF:  You have persistent pain and swelling.  You develop redness, numbness, or unexpected weakness.  Your symptoms are getting worse rather than improving after several days. These symptoms may indicate that further evaluation or further X-rays are needed. Sometimes, X-rays may not show a small broken bone (fracture) until 1 week or 10 days later. Make a follow-up appointment with your caregiver. Ask when your X-ray results will be ready. Make sure you get your X-ray results. Document Released: 02/04/2001 Document  Revised: 01/15/2012 Document Reviewed: 03/24/2011 ExitCare Patient Information 2014 [place], [place].   Emergency Department Resource Guide 1) Find a Doctor and Pay Out of Pocket Although you won't have to find out who is covered by your insurance plan, it is a good idea to ask around and get recommendations. You will then need to call the  office and see if the doctor you have chosen will accept you as a new patient and what types of options they offer for patients who are self-pay. Some doctors offer discounts or will set up payment plans for their patients who do not have insurance, but you will need to ask so you aren't surprised when you get to your appointment.  2) Contact Your Local Health Department Not all health departments have doctors that can see patients for sick visits, but many do, so it is worth a call to see if yours does. If you don't know where your local health department is, you can check in your phone book. The CDC also has a tool to help you locate your state's health department, and many state websites also have listings of all of their local health departments.  3) Find a Walk-in Clinic If your illness is not likely to be very severe or complicated, you may want to try a walk in clinic. These are popping up all over the country in pharmacies, drugstores, and shopping centers. They're usually staffed by nurse practitioners or physician assistants that have been trained to treat common illnesses and complaints. They're usually fairly quick and inexpensive. However, if you have serious medical issues or chronic medical problems, these are probably not your best option.  No Primary Care Doctor: - Call Health Connect at  [phone] - they can help you locate a primary care doctor that  accepts your insurance, provides certain services, etc. - Physician Referral Service- [phone]  Chronic Pain Problems: Organization         Address  Phone   Notes  [facility]  [phone] Patients need to be referred by their primary care doctor.   Medication Assistance: Organization         Address  Phone   Notes  [facility] Medication [facility] [address] [place], [place] [address] [phone] --Must be a resident of [facility] -- Must have NO insurance coverage  whatsoever (no Medicaid/ Medicare, etc.) -- The pt. MUST have a primary care doctor that directs their care regularly and follows them in the community   [facility]ist  [phone]   [employer]  [phone]    Agencies that provide inexpensive medical care: Organization         Address  Phone   Notes  [facility]  [phone]   [facility]    [phone]   [facility] [address] [place], [place] [address] [phone]   [facility] [address] [place] [phone]   Planned Parenthood    [phone]   [facility]    [phone]   [facility]  [address], [place] Phone:  [phone], Fax:  [phone] Hours of Operation:  9 am - 6 pm, M-F.  Also accepts Medicaid/Medicare and self-pay.  [facility]  [address], [place] Phone: [phone], Fax: [phone]. Hours of Operation:  8:30 am -  5:30 pm, M-F.  Also accepts Medicaid and self-pay.  [facility] [place] [address], [place] Phone: [phone]   [facility] [address] Natasha Bence [place], [place] [phone], Ext. [phone] Mondays & Thursdays: 7-9 AM.  First 15 patients are seen on a first come, first serve basis.    Medicaid-accepting [facility] Providers:  Organization         Address  Phone   Notes  [facility] [address], [place] [phone] Also accepts self-pay patients.  [facility] [address] Laurell Josephs [place], [place]  [phone]   [facility] [address], [place] [phone]   [facility] [address] [place] [phone]   Renaye Rakers [address], [place]   [phone] Only accepts [place] patients after they have their name applied  to their card.   Self-Pay (no insurance) in [facility]:  Organization         Address  Phone   Notes  Sickle Cell Patients, [facility] [address] [place], [place] [phone]   [facility] [address] [place], [place] [phone]   [facility]  [address], [place] [phone]   [facility]/Dr. Osei-Bonsu  [address] [place] or [address], [place] [phone] Phone number for both [place] and [place] locations is the same.  [facility] [address] [place] [phone]   [facility] [address], [place] or [address] [phone]   [facility] [address], [place] [phone], phone; [phone], fax Sees patients 1st and 3rd Saturday of every month.  Must not qualify for public or private insurance (i.e. Medicaid, Medicare, [place] Health Choice, Veterans' Benefits)  Household income should be no more than 200% of the poverty level The clinic cannot treat you if you are pregnant or think you are pregnant  Sexually transmitted diseases are not treated at the clinic.    Dental Care: Organization         Address  Phone  Notes  [facility] [address] [place], [place] [phone] Accepts children up to age 45 who are enrolled in [place] or [place] Health Choice; pregnant women with a Medicaid card; and children who have applied for Medicaid or [place] Health Choice, but were declined, whose parents can pay a reduced fee at time of service.  [facility] of [facility]  [address], [place] [phone] Accepts children up to age 78 who are enrolled in [place] or [place] Health Choice; pregnant women with a Medicaid card; and children who have applied for Medicaid or   Health Choice, but were declined, whose parents can pay a reduced fee at time of service.  [facility]  [address] [place], [place] [phone] Patients are seen by appointment only. Walk-ins are not accepted. [facility] will see patients 61 years of age and older. Monday - Tuesday (8am-5pm) Most Wednesdays (8:30-5pm) $30 per visit, cash only  [facility] Adult Dental Access [facility]  [address], [facility] [phone] Patients are seen by appointment only. Walk-ins are  not accepted. [facility] will see patients 74 years of age and older. One Wednesday Evening (Monthly: Volunteer Based).  $30 per visit, cash only  [employer]  [phone] for adults; Children under age 75, call Graduate Pediatric Dentistry at [phone]. Children aged 75-14, please call [phone] to request a pediatric application.  Dental services are provided in all areas of dental care including fillings, crowns and bridges, complete and partial dentures, implants, gum treatment, root canals, and extractions. Preventive care is also provided. Treatment is provided to both adults and children. Patients are selected via a lottery and there is often a waiting list.   [facility] [address], [place]  [phone] [email or web address]   [facility] [address] [place], [place] [phone], Ext. [phone] Second and Fourth Thursday of each month, opens at 6:30 AM; Clinic ends at 9 AM.  Patients are seen on a first-come first-served basis, and a limited number are seen during each clinic.   [facility]  [address] Ether [place], [place] [phone]   Eligibility Requirements You must have lived in [place], [place], or [place] counties for at least the last three months.   You cannot be eligible for state or federal sponsored [place], including [employer], [place], or [employer].    You generally cannot be eligible for healthcare insurance through your employer.    How to apply: Eligibility screenings are held every Tuesday and Wednesday afternoon from 1:00 pm until 4:00 pm. You do not need an appointment for the interview!  [facility] [address] [place], [place] [phone]   [facility] Health Department  [phone]   [facility] Health Department  [phone]   [facility] Health Department  [phone]    Behavioral Health Resources in the Community: Intensive Outpatient Programs Organization         Address  Phone  Notes  [facility] Services [address] [place], [place] [phone]   [facility] [address], [place], [place] [phone]   [facility] [address], [place], [place]  [phone]   [facility] Mental Health [address]  [place], [place] [phone] or [phone]   Substance Abuse Resources Organization         Address  Phone  Notes  [facility]  [phone]   [facility]  [phone]   [facility]  [phone]   Floydene Flock  [phone]   Residential & Outpatient Substance Abuse Program  [phone]   Psychological Services Organization         Address  Phone  Notes  [facility] Behavioral Health  [phone]   [facility] Services  [phone]   [facility] Mental Health [address], [place] [phone] or [phone]    Mobile Crisis Teams Organization         Address  Phone  Notes  [facility], Mobile Crisis Care Unit  [phone]   [facility]  [address] [place], [place] [phone]   Doristine Locks [address] [place] [phone]    Self-Help/Support Groups Organization         Address  Phone             Notes  Mental Health Assoc. of  - variety of support groups  336- I7437963 Call  for more information  Narcotics Anonymous (NA),  [facility] [address], [place]  2 meetings at this location   Residential Sports administratorTreatment Programs Organization         Address  Phone  Notes  [facility] [address],    [place]  [phone]   [facility]  [address], [place] [phone], [place], [place] [phone]   [facility] [address], [place] [phone] Admissions: 8am-3pm M-F  [facility] [address]    [place], [place] [phone]   [facility] [address], [place], [place] [phone]   [facility] [address]  [place], [place] [phone]   [facility] - Intensive Outpatient [address], [place], [place] [phone]   [facility] ([facility] [address]  [place], [place] [phone] or [phone]   [facility] ([facility]) [address] [place], [place] [phone] Accepts Medicaid  [facility] [address]  [place] [phone] Substance Abuse/Addiction Treatment   [facility] Health Resources Organization         Address  Phone  Notes  [facility]  [phone]) [phone]   Angie FavaJulie Brannon, PhD [address] [place], [place]   [phone](336) [phone] or [phone]) [phone]   [facility]   [address] [place], [place] [phone]) [phone]   [facility] [address], [place], [place] [phone](336) [phone] Insurance/Medicaid/sponsorship through [facility]  [facility] [address]                                    [place], [place] [phone](336) [phone] Therapy/tele-psych/case  [facility] [address].   [place], [place] [phone](336) [phone]    Dr. Lolly MustacheArfeen  [phone]) [phone]   [facility]  [employer] [facility] 1) [address], [place] 2) [address], [place] 3)  [address], [place]  [phone](336) [phone](336) [phone]  [phone](336) [phone]   [facility] Child Abuse Hotline [phone](336) 5622611653 or [phone](336) [phone] (After Hours)

## 2014-01-31 NOTE — ED Notes (Signed)
Pt discharged with family member.
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
## 2014-02-07 NOTE — ED Provider Notes (Signed)
Medical screening examination/treatment/procedure(s) were performed by non-physician practitioner and as supervising physician I was immediately available for consultation/collaboration.   Nelia Shiobert L Denim Start, MD 02/07/14 [phone]

## 2014-02-13 ENCOUNTER — Ambulatory Visit: Payer: Medicare Other | Admitting: Nurse Practitioner

## 2014-02-20 ENCOUNTER — Ambulatory Visit (INDEPENDENT_AMBULATORY_CARE_PROVIDER_SITE_OTHER): Payer: Medicare Other | Admitting: Psychiatry

## 2014-02-20 ENCOUNTER — Encounter (HOSPITAL_COMMUNITY): Payer: Self-pay | Admitting: Psychiatry

## 2014-02-20 VITALS — BP 113/81 | HR 92 | Ht 67.0 in | Wt 185.0 lb

## 2014-02-20 DIAGNOSIS — F329 Major depressive disorder, single episode, unspecified: Secondary | ICD-10-CM | POA: Diagnosis not present

## 2014-02-20 DIAGNOSIS — F29 Unspecified psychosis not due to a substance or known physiological condition: Secondary | ICD-10-CM

## 2014-02-20 DIAGNOSIS — F332 Major depressive disorder, recurrent severe without psychotic features: Secondary | ICD-10-CM

## 2014-02-20 MED ORDER — FLUOXETINE HCL 40 MG PO CAPS: 40.0000 mg | ORAL_CAPSULE | Freq: Every day | ORAL | Status: DC

## 2014-02-20 MED ORDER — AMPHETAMINE-DEXTROAMPHETAMINE 10 MG PO TABS: 10.0000 mg | ORAL_TABLET | Freq: Every day | ORAL | Status: DC

## 2014-02-20 MED ORDER — BUSPIRONE HCL 5 MG PO TABS: 5.0000 mg | ORAL_TABLET | Freq: Three times a day (TID) | ORAL | Status: DC

## 2014-02-20 MED ORDER — QUETIAPINE FUMARATE 100 MG PO TABS: 100.0000 mg | ORAL_TABLET | Freq: Every day | ORAL | Status: DC
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
## 2014-03-20 ENCOUNTER — Encounter (HOSPITAL_COMMUNITY): Payer: Self-pay | Admitting: Psychiatry

## 2014-03-20 ENCOUNTER — Ambulatory Visit (INDEPENDENT_AMBULATORY_CARE_PROVIDER_SITE_OTHER): Payer: Medicare Other | Admitting: Psychiatry

## 2014-03-20 VITALS — BP 115/63 | HR 83 | Wt 183.0 lb

## 2014-03-20 DIAGNOSIS — F332 Major depressive disorder, recurrent severe without psychotic features: Secondary | ICD-10-CM

## 2014-03-20 DIAGNOSIS — F988 Other specified behavioral and emotional disorders with onset usually occurring in childhood and adolescence: Secondary | ICD-10-CM

## 2014-03-20 DIAGNOSIS — F323 Major depressive disorder, single episode, severe with psychotic features: Secondary | ICD-10-CM

## 2014-03-20 MED ORDER — BUSPIRONE HCL 5 MG PO TABS: 5.0000 mg | ORAL_TABLET | Freq: Three times a day (TID) | ORAL | Status: DC

## 2014-03-20 MED ORDER — TOPIRAMATE 100 MG PO TABS: 100.0000 mg | ORAL_TABLET | Freq: Every day | ORAL | Status: DC

## 2014-03-20 MED ORDER — AMPHETAMINE-DEXTROAMPHETAMINE 10 MG PO TABS: ORAL_TABLET | ORAL | Status: DC

## 2014-03-20 MED ORDER — FLUOXETINE HCL 40 MG PO CAPS: 40.0000 mg | ORAL_CAPSULE | Freq: Every day | ORAL | Status: DC

## 2014-03-20 MED ORDER — QUETIAPINE FUMARATE 100 MG PO TABS: 100.0000 mg | ORAL_TABLET | Freq: Every day | ORAL | Status: DC
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
## 2014-04-02 ENCOUNTER — Other Ambulatory Visit: Payer: Self-pay | Admitting: Neurology

## 2014-04-02 NOTE — Telephone Encounter (Signed)
York Spaniel, MD at 11/13/2013 4:41 PM     Status: Signed        I called the patient. The patient indicates that she is still having frequent headaches. The patient is on Topamax 50 mg at night. She is tolerating the medication well, so we will increase the dose. The patient is to go to 75 mg nightly until she uses up to 25 mg tablets, then we will convert to the 100 mg tablets at night. In the future, increasing the Seroquel at night may be beneficial.

## 2014-04-07 ENCOUNTER — Ambulatory Visit (HOSPITAL_COMMUNITY): Payer: Self-pay | Admitting: Marriage and Family Therapist

## 2014-05-01 ENCOUNTER — Other Ambulatory Visit (HOSPITAL_COMMUNITY): Payer: Self-pay | Admitting: Psychiatry

## 2014-05-20 ENCOUNTER — Ambulatory Visit (INDEPENDENT_AMBULATORY_CARE_PROVIDER_SITE_OTHER): Payer: Medicare Other | Admitting: Psychiatry

## 2014-05-20 ENCOUNTER — Encounter (HOSPITAL_COMMUNITY): Payer: Self-pay | Admitting: Psychiatry

## 2014-05-20 VITALS — BP 107/72 | HR 92 | Ht 67.0 in | Wt 184.6 lb

## 2014-05-20 DIAGNOSIS — F988 Other specified behavioral and emotional disorders with onset usually occurring in childhood and adolescence: Secondary | ICD-10-CM

## 2014-05-20 DIAGNOSIS — F332 Major depressive disorder, recurrent severe without psychotic features: Secondary | ICD-10-CM | POA: Diagnosis not present

## 2014-05-20 MED ORDER — BUSPIRONE HCL 5 MG PO TABS: 5.0000 mg | ORAL_TABLET | Freq: Three times a day (TID) | ORAL | Status: DC

## 2014-05-20 MED ORDER — FLUOXETINE HCL 40 MG PO CAPS: ORAL_CAPSULE | ORAL | Status: DC

## 2014-05-20 MED ORDER — AMPHETAMINE-DEXTROAMPHETAMINE 10 MG PO TABS: ORAL_TABLET | ORAL | Status: DC

## 2014-05-20 MED ORDER — QUETIAPINE FUMARATE 100 MG PO TABS: 100.0000 mg | ORAL_TABLET | Freq: Every day | ORAL | Status: DC
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
## 2014-07-17 DIAGNOSIS — K921 Melena: Secondary | ICD-10-CM | POA: Diagnosis not present

## 2014-07-17 DIAGNOSIS — K5900 Constipation, unspecified: Secondary | ICD-10-CM | POA: Diagnosis not present

## 2014-07-21 ENCOUNTER — Ambulatory Visit (HOSPITAL_COMMUNITY): Payer: Self-pay | Admitting: Psychiatry

## 2014-07-22 ENCOUNTER — Other Ambulatory Visit (HOSPITAL_COMMUNITY): Payer: Self-pay | Admitting: Psychiatry

## 2014-07-28 ENCOUNTER — Encounter (HOSPITAL_COMMUNITY): Payer: Self-pay | Admitting: Psychiatry

## 2014-07-28 ENCOUNTER — Ambulatory Visit (INDEPENDENT_AMBULATORY_CARE_PROVIDER_SITE_OTHER): Payer: Medicare Other | Admitting: Psychiatry

## 2014-07-28 VITALS — BP 109/71 | HR 82 | Ht 67.0 in | Wt 184.0 lb

## 2014-07-28 DIAGNOSIS — F988 Other specified behavioral and emotional disorders with onset usually occurring in childhood and adolescence: Secondary | ICD-10-CM

## 2014-07-28 DIAGNOSIS — F332 Major depressive disorder, recurrent severe without psychotic features: Secondary | ICD-10-CM

## 2014-07-28 DIAGNOSIS — F323 Major depressive disorder, single episode, severe with psychotic features: Secondary | ICD-10-CM

## 2014-07-28 MED ORDER — FLUOXETINE HCL 40 MG PO CAPS: ORAL_CAPSULE | ORAL | Status: DC

## 2014-07-28 MED ORDER — QUETIAPINE FUMARATE 100 MG PO TABS: 100.0000 mg | ORAL_TABLET | Freq: Every day | ORAL | Status: DC

## 2014-07-28 MED ORDER — AMPHETAMINE-DEXTROAMPHETAMINE 10 MG PO TABS: ORAL_TABLET | ORAL | Status: DC

## 2014-07-28 MED ORDER — BUSPIRONE HCL 5 MG PO TABS: 5.0000 mg | ORAL_TABLET | Freq: Three times a day (TID) | ORAL | Status: DC
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
## 2014-08-13 DIAGNOSIS — K921 Melena: Secondary | ICD-10-CM | POA: Diagnosis not present

## 2014-08-13 DIAGNOSIS — K648 Other hemorrhoids: Secondary | ICD-10-CM | POA: Diagnosis not present

## 2014-08-13 LAB — HM COLONOSCOPY

## 2014-08-15 ENCOUNTER — Other Ambulatory Visit: Payer: Self-pay | Admitting: Neurology

## 2014-08-15 ENCOUNTER — Other Ambulatory Visit (HOSPITAL_COMMUNITY): Payer: Self-pay | Admitting: Psychiatry

## 2014-08-16 ENCOUNTER — Other Ambulatory Visit (HOSPITAL_COMMUNITY): Payer: Self-pay | Admitting: Psychiatry

## 2014-08-16 NOTE — Telephone Encounter (Signed)
Given new script on 07/28/14 with 2 additional refills

## 2014-08-16 NOTE — Telephone Encounter (Signed)
Given on 07/28/14 with refills. 

## 2014-08-16 NOTE — Telephone Encounter (Signed)
Per result note from Jan

## 2014-08-18 DIAGNOSIS — Z23 Encounter for immunization: Secondary | ICD-10-CM | POA: Diagnosis not present

## 2014-08-31 ENCOUNTER — Other Ambulatory Visit (HOSPITAL_COMMUNITY): Payer: Self-pay | Admitting: Psychiatry

## 2014-08-31 DIAGNOSIS — F332 Major depressive disorder, recurrent severe without psychotic features: Secondary | ICD-10-CM

## 2014-08-31 NOTE — Telephone Encounter (Signed)
Per pharmacist Mellody Dancekeith, RX sent 07/28/14 for Buspar and Fluoxetine never received (EPIC shows "E-Prescribing Status: Receipt confirmed by pharmacy (07/28/2014 11:13 AM EDT)) for both. Will refill RX as originally ordered by MD on 9/22

## 2014-09-03 ENCOUNTER — Other Ambulatory Visit (HOSPITAL_COMMUNITY): Payer: Self-pay | Admitting: Psychiatry

## 2014-09-16 ENCOUNTER — Telehealth (HOSPITAL_COMMUNITY): Payer: Self-pay | Admitting: *Deleted

## 2014-09-16 ENCOUNTER — Other Ambulatory Visit: Payer: Self-pay | Admitting: Neurology

## 2014-09-16 NOTE — Telephone Encounter (Signed)
Patient left VM stating she will run out of Adderall before her next appt 12/22. Reviewed chart. Per chart, was given 2 RX on 9/22--one to fill 9/22 and one to fill 10/22 or after. Contacted patient, she states she will need RX to fill on 11/22 to have enough medicine to last until appt. Informed her MD will be given message and she will be called when RX is ready to be picked up.

## 2014-09-21 ENCOUNTER — Other Ambulatory Visit (HOSPITAL_COMMUNITY): Payer: Self-pay | Admitting: Psychiatry

## 2014-09-22 ENCOUNTER — Other Ambulatory Visit (HOSPITAL_COMMUNITY): Payer: Self-pay | Admitting: *Deleted

## 2014-09-22 DIAGNOSIS — F988 Other specified behavioral and emotional disorders with onset usually occurring in childhood and adolescence: Secondary | ICD-10-CM

## 2014-09-22 MED ORDER — AMPHETAMINE-DEXTROAMPHETAMINE 10 MG PO TABS: ORAL_TABLET | ORAL | Status: DC

## 2014-09-23 ENCOUNTER — Telehealth (HOSPITAL_COMMUNITY): Payer: Self-pay

## 2014-09-23 NOTE — Telephone Encounter (Signed)
Ashby picked up prescription on 09/23/14  DL  [id number]  dlo

## 2014-10-09 ENCOUNTER — Other Ambulatory Visit: Payer: Self-pay | Admitting: Neurology

## 2014-10-20 ENCOUNTER — Other Ambulatory Visit (HOSPITAL_COMMUNITY): Payer: Self-pay | Admitting: Psychiatry

## 2014-10-20 ENCOUNTER — Other Ambulatory Visit (HOSPITAL_COMMUNITY): Payer: Self-pay | Admitting: *Deleted

## 2014-10-20 ENCOUNTER — Telehealth (HOSPITAL_COMMUNITY): Payer: Self-pay

## 2014-10-20 DIAGNOSIS — F988 Other specified behavioral and emotional disorders with onset usually occurring in childhood and adolescence: Secondary | ICD-10-CM

## 2014-10-20 MED ORDER — AMPHETAMINE-DEXTROAMPHETAMINE 10 MG PO TABS: ORAL_TABLET | ORAL | Status: DC

## 2014-10-21 ENCOUNTER — Ambulatory Visit (INDEPENDENT_AMBULATORY_CARE_PROVIDER_SITE_OTHER): Payer: Medicare Other | Admitting: Psychiatry

## 2014-10-21 ENCOUNTER — Encounter (HOSPITAL_COMMUNITY): Payer: Self-pay | Admitting: Psychiatry

## 2014-10-21 ENCOUNTER — Ambulatory Visit (HOSPITAL_COMMUNITY): Payer: Self-pay | Admitting: Psychiatry

## 2014-10-21 VITALS — BP 109/63 | HR 98 | Ht 67.0 in | Wt 191.8 lb

## 2014-10-21 DIAGNOSIS — F333 Major depressive disorder, recurrent, severe with psychotic symptoms: Secondary | ICD-10-CM

## 2014-10-21 DIAGNOSIS — F332 Major depressive disorder, recurrent severe without psychotic features: Secondary | ICD-10-CM

## 2014-10-21 DIAGNOSIS — F330 Major depressive disorder, recurrent, mild: Secondary | ICD-10-CM

## 2014-10-21 DIAGNOSIS — Z79899 Other long term (current) drug therapy: Secondary | ICD-10-CM

## 2014-10-21 DIAGNOSIS — F988 Other specified behavioral and emotional disorders with onset usually occurring in childhood and adolescence: Secondary | ICD-10-CM

## 2014-10-21 MED ORDER — AMPHETAMINE-DEXTROAMPHETAMINE 10 MG PO TABS: ORAL_TABLET | ORAL | Status: DC

## 2014-10-21 MED ORDER — FLUOXETINE HCL 40 MG PO CAPS: 40.0000 mg | ORAL_CAPSULE | Freq: Every day | ORAL | Status: DC

## 2014-10-21 MED ORDER — QUETIAPINE FUMARATE 100 MG PO TABS: 100.0000 mg | ORAL_TABLET | Freq: Every day | ORAL | Status: DC

## 2014-10-21 MED ORDER — BUSPIRONE HCL 5 MG PO TABS: 5.0000 mg | ORAL_TABLET | Freq: Three times a day (TID) | ORAL | Status: DC
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
## 2014-10-27 ENCOUNTER — Ambulatory Visit (HOSPITAL_COMMUNITY): Payer: Self-pay | Admitting: Psychiatry

## 2014-11-03 ENCOUNTER — Other Ambulatory Visit: Payer: Self-pay | Admitting: Neurology

## 2014-11-12 ENCOUNTER — Emergency Department (HOSPITAL_COMMUNITY): Payer: Medicare Other

## 2014-11-12 ENCOUNTER — Emergency Department (HOSPITAL_COMMUNITY)
Admission: EM | Admit: 2014-11-12 | Discharge: 2014-11-12 | Disposition: A | Discharge status: Home / Self Care | Payer: Medicare Other | Attending: Emergency Medicine | Admitting: Emergency Medicine

## 2014-11-12 ENCOUNTER — Encounter (HOSPITAL_COMMUNITY): Payer: Self-pay | Admitting: *Deleted

## 2014-11-12 DIAGNOSIS — Z792 Long term (current) use of antibiotics: Secondary | ICD-10-CM | POA: Insufficient documentation

## 2014-11-12 DIAGNOSIS — G43019 Migraine without aura, intractable, without status migrainosus: Secondary | ICD-10-CM | POA: Diagnosis not present

## 2014-11-12 DIAGNOSIS — R05 Cough: Secondary | ICD-10-CM

## 2014-11-12 DIAGNOSIS — F329 Major depressive disorder, single episode, unspecified: Secondary | ICD-10-CM | POA: Diagnosis not present

## 2014-11-12 DIAGNOSIS — Z3202 Encounter for pregnancy test, result negative: Secondary | ICD-10-CM | POA: Diagnosis not present

## 2014-11-12 DIAGNOSIS — F419 Anxiety disorder, unspecified: Secondary | ICD-10-CM | POA: Insufficient documentation

## 2014-11-12 DIAGNOSIS — Z8639 Personal history of other endocrine, nutritional and metabolic disease: Secondary | ICD-10-CM | POA: Diagnosis not present

## 2014-11-12 DIAGNOSIS — Z87442 Personal history of urinary calculi: Secondary | ICD-10-CM | POA: Insufficient documentation

## 2014-11-12 DIAGNOSIS — R0981 Nasal congestion: Secondary | ICD-10-CM | POA: Diagnosis not present

## 2014-11-12 DIAGNOSIS — R059 Cough, unspecified: Secondary | ICD-10-CM

## 2014-11-12 DIAGNOSIS — N189 Chronic kidney disease, unspecified: Secondary | ICD-10-CM | POA: Insufficient documentation

## 2014-11-12 DIAGNOSIS — R112 Nausea with vomiting, unspecified: Secondary | ICD-10-CM | POA: Diagnosis not present

## 2014-11-12 DIAGNOSIS — M791 Myalgia, unspecified site: Secondary | ICD-10-CM

## 2014-11-12 DIAGNOSIS — J3489 Other specified disorders of nose and nasal sinuses: Secondary | ICD-10-CM | POA: Diagnosis not present

## 2014-11-12 DIAGNOSIS — R52 Pain, unspecified: Secondary | ICD-10-CM | POA: Diagnosis not present

## 2014-11-12 LAB — URINALYSIS, ROUTINE W REFLEX MICROSCOPIC
Bilirubin Urine: NEGATIVE
Glucose, UA: NEGATIVE mg/dL
Ketones, ur: NEGATIVE mg/dL
Leukocytes, UA: NEGATIVE
Nitrite: NEGATIVE
Protein, ur: NEGATIVE mg/dL
Specific Gravity, Urine: 1.021 (ref 1.005–1.030)
Urobilinogen, UA: 1 mg/dL (ref 0.0–1.0)
pH: 6.5 (ref 5.0–8.0)

## 2014-11-12 LAB — CBC WITH DIFFERENTIAL/PLATELET
Basophils Absolute: 0.1 10*3/uL (ref 0.0–0.1)
Basophils Relative: 1 % (ref 0–1)
Eosinophils Absolute: 0.6 10*3/uL (ref 0.0–0.7)
Eosinophils Relative: 5 % (ref 0–5)
HCT: 39.7 % (ref 36.0–46.0)
Hemoglobin: 13 g/dL (ref 12.0–15.0)
Lymphocytes Relative: 21 % (ref 12–46)
Lymphs Abs: 2.4 10*3/uL (ref 0.7–4.0)
MCH: 29.4 pg (ref 26.0–34.0)
MCHC: 32.7 g/dL (ref 30.0–36.0)
MCV: 89.8 fL (ref 78.0–100.0)
Monocytes Absolute: 0.7 10*3/uL (ref 0.1–1.0)
Monocytes Relative: 6 % (ref 3–12)
Neutro Abs: 7.3 10*3/uL (ref 1.7–7.7)
Neutrophils Relative %: 67 % (ref 43–77)
Platelets: 347 10*3/uL (ref 150–400)
RBC: 4.42 MIL/uL (ref 3.87–5.11)
RDW: 12.5 % (ref 11.5–15.5)
WBC: 11.1 10*3/uL — ABNORMAL HIGH (ref 4.0–10.5)

## 2014-11-12 LAB — COMPREHENSIVE METABOLIC PANEL
ALT: 10 U/L (ref 0–35)
AST: 14 U/L (ref 0–37)
Albumin: 3.4 g/dL — ABNORMAL LOW (ref 3.5–5.2)
Alkaline Phosphatase: 101 U/L (ref 39–117)
Anion gap: 7 (ref 5–15)
BUN: 13 mg/dL (ref 6–23)
CO2: 26 mmol/L (ref 19–32)
Calcium: 9.2 mg/dL (ref 8.4–10.5)
Chloride: 107 mEq/L (ref 96–112)
Creatinine, Ser: 0.88 mg/dL (ref 0.50–1.10)
GFR calc Af Amer: >90 mL/min (ref >90)
GFR calc non Af Amer: 79 mL/min — ABNORMAL LOW (ref >90)
Glucose, Bld: 112 mg/dL — ABNORMAL HIGH (ref 70–99)
Potassium: 3.3 mmol/L — ABNORMAL LOW (ref 3.5–5.1)
Sodium: 140 mmol/L (ref 135–145)
Total Bilirubin: 0.2 mg/dL — ABNORMAL LOW (ref 0.3–1.2)
Total Protein: 7.2 g/dL (ref 6.0–8.3)

## 2014-11-12 LAB — POC URINE PREG, ED: Preg Test, Ur: NEGATIVE

## 2014-11-12 LAB — URINE MICROSCOPIC-ADD ON

## 2014-11-12 LAB — LIPASE, BLOOD: Lipase: 35 U/L (ref 11–59)

## 2014-11-12 MED ORDER — HYDROCODONE-ACETAMINOPHEN 7.5-325 MG/15ML PO SOLN: 15.0000 mL | ORAL | Status: DC | PRN

## 2014-11-12 MED ORDER — HYDROCODONE-ACETAMINOPHEN 5-325 MG PO TABS
2.0000 | ORAL_TABLET | Freq: Once | ORAL | Status: AC
  Administered 2014-11-12: 2 via ORAL
  Filled 2014-11-12: qty 2

## 2014-11-12 MED ORDER — ONDANSETRON 4 MG PO TBDP
8.0000 mg | ORAL_TABLET | Freq: Once | ORAL | Status: AC
  Administered 2014-11-12: 8 mg via ORAL
  Filled 2014-11-12: qty 2

## 2014-11-12 NOTE — ED Notes (Signed)
Pt requesting Rx for Vicodin when she is discharged.  Pt st's Advil and Tylenol is not working

## 2014-11-12 NOTE — ED Notes (Signed)
Pt c/o generalized body aches, cough, runny nose, n/v/d for two days.

## 2014-11-12 NOTE — Discharge Instructions (Signed)
You were evaluated in the ED today for your congestion and body aches, there does not appear to be an emergent cause for your symptoms at this time. You will need to follow-up with your primary care for further evaluation and management of your symptoms. You may continue to take OTC cold and flu medications to help with her symptoms. Please take your medicines as prescribed.
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
## 2014-11-13 LAB — URINE CULTURE
Colony Count: 50000
Special Requests: NORMAL

## 2014-12-10 NOTE — Telephone Encounter (Signed)
amphetamine-dextroamphetamine (ADDERALL) 10 MG tablet 90 tablet 0 10/21/2014       Sig: Take 2 in am and 1 in noon     Class: Print     Notes to Pharmacy: Do not refill until 12/22/14     Medication was sent

## 2015-01-21 ENCOUNTER — Ambulatory Visit (HOSPITAL_COMMUNITY): Payer: Self-pay | Admitting: Psychiatry

## 2015-01-26 ENCOUNTER — Encounter (HOSPITAL_COMMUNITY): Payer: Self-pay

## 2015-01-26 ENCOUNTER — Ambulatory Visit (HOSPITAL_COMMUNITY): Payer: Self-pay | Admitting: Psychiatry

## 2015-01-26 ENCOUNTER — Encounter (HOSPITAL_COMMUNITY): Payer: Self-pay | Admitting: Psychiatry

## 2015-01-26 ENCOUNTER — Ambulatory Visit (INDEPENDENT_AMBULATORY_CARE_PROVIDER_SITE_OTHER): Payer: Medicare Other | Admitting: Psychiatry

## 2015-01-26 VITALS — BP 115/71 | HR 80 | Ht 67.0 in | Wt 183.0 lb

## 2015-01-26 DIAGNOSIS — F333 Major depressive disorder, recurrent, severe with psychotic symptoms: Secondary | ICD-10-CM

## 2015-01-26 DIAGNOSIS — F988 Other specified behavioral and emotional disorders with onset usually occurring in childhood and adolescence: Secondary | ICD-10-CM

## 2015-01-26 DIAGNOSIS — F332 Major depressive disorder, recurrent severe without psychotic features: Secondary | ICD-10-CM

## 2015-01-26 MED ORDER — AMPHETAMINE-DEXTROAMPHETAMINE 10 MG PO TABS: ORAL_TABLET | ORAL | Status: DC

## 2015-01-26 MED ORDER — BUSPIRONE HCL 5 MG PO TABS: 5.0000 mg | ORAL_TABLET | Freq: Three times a day (TID) | ORAL | Status: DC

## 2015-01-26 MED ORDER — FLUOXETINE HCL 40 MG PO CAPS: 40.0000 mg | ORAL_CAPSULE | Freq: Every day | ORAL | Status: DC

## 2015-01-26 MED ORDER — QUETIAPINE FUMARATE 100 MG PO TABS: 100.0000 mg | ORAL_TABLET | Freq: Every day | ORAL | Status: DC
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
## 2015-01-28 ENCOUNTER — Other Ambulatory Visit: Payer: Self-pay | Admitting: Neurology

## 2015-01-28 NOTE — Telephone Encounter (Signed)
Last seen in 2014 

## 2015-03-29 ENCOUNTER — Ambulatory Visit (HOSPITAL_COMMUNITY): Payer: Self-pay | Admitting: Psychiatry

## 2015-03-31 ENCOUNTER — Ambulatory Visit (HOSPITAL_COMMUNITY): Payer: Self-pay | Admitting: Psychiatry

## 2015-04-07 ENCOUNTER — Encounter (HOSPITAL_COMMUNITY): Payer: Self-pay | Admitting: Psychiatry

## 2015-04-07 ENCOUNTER — Ambulatory Visit (INDEPENDENT_AMBULATORY_CARE_PROVIDER_SITE_OTHER): Payer: Medicare Other | Admitting: Psychiatry

## 2015-04-07 VITALS — BP 120/72 | HR 86 | Ht 67.0 in | Wt 182.0 lb

## 2015-04-07 DIAGNOSIS — F332 Major depressive disorder, recurrent severe without psychotic features: Secondary | ICD-10-CM

## 2015-04-07 DIAGNOSIS — F323 Major depressive disorder, single episode, severe with psychotic features: Secondary | ICD-10-CM

## 2015-04-07 DIAGNOSIS — Z79899 Other long term (current) drug therapy: Secondary | ICD-10-CM | POA: Diagnosis not present

## 2015-04-07 MED ORDER — QUETIAPINE FUMARATE 100 MG PO TABS: 100.0000 mg | ORAL_TABLET | Freq: Every day | ORAL | Status: DC

## 2015-04-07 MED ORDER — FLUOXETINE HCL 40 MG PO CAPS: 40.0000 mg | ORAL_CAPSULE | Freq: Every day | ORAL | Status: DC

## 2015-04-07 MED ORDER — BUSPIRONE HCL 5 MG PO TABS: 5.0000 mg | ORAL_TABLET | Freq: Three times a day (TID) | ORAL | Status: DC
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
## 2015-04-13 ENCOUNTER — Encounter (HOSPITAL_COMMUNITY): Payer: Self-pay | Admitting: *Deleted

## 2015-04-13 ENCOUNTER — Emergency Department (HOSPITAL_COMMUNITY)
Admission: EM | Admit: 2015-04-13 | Discharge: 2015-04-13 | Disposition: A | Discharge status: Home / Self Care | Payer: Medicare Other | Attending: Emergency Medicine | Admitting: Emergency Medicine

## 2015-04-13 DIAGNOSIS — N189 Chronic kidney disease, unspecified: Secondary | ICD-10-CM | POA: Insufficient documentation

## 2015-04-13 DIAGNOSIS — L309 Dermatitis, unspecified: Secondary | ICD-10-CM | POA: Insufficient documentation

## 2015-04-13 DIAGNOSIS — F419 Anxiety disorder, unspecified: Secondary | ICD-10-CM | POA: Insufficient documentation

## 2015-04-13 DIAGNOSIS — G43019 Migraine without aura, intractable, without status migrainosus: Secondary | ICD-10-CM | POA: Diagnosis not present

## 2015-04-13 DIAGNOSIS — Z87442 Personal history of urinary calculi: Secondary | ICD-10-CM | POA: Insufficient documentation

## 2015-04-13 DIAGNOSIS — Z79899 Other long term (current) drug therapy: Secondary | ICD-10-CM | POA: Diagnosis not present

## 2015-04-13 DIAGNOSIS — R2243 Localized swelling, mass and lump, lower limb, bilateral: Secondary | ICD-10-CM | POA: Diagnosis not present

## 2015-04-13 DIAGNOSIS — Z8639 Personal history of other endocrine, nutritional and metabolic disease: Secondary | ICD-10-CM | POA: Insufficient documentation

## 2015-04-13 DIAGNOSIS — F329 Major depressive disorder, single episode, unspecified: Secondary | ICD-10-CM | POA: Diagnosis not present

## 2015-04-13 DIAGNOSIS — R21 Rash and other nonspecific skin eruption: Secondary | ICD-10-CM | POA: Diagnosis present

## 2015-04-13 MED ORDER — FAMOTIDINE 20 MG PO TABS
20.0000 mg | ORAL_TABLET | Freq: Once | ORAL | Status: AC
  Administered 2015-04-13: 20 mg via ORAL
  Filled 2015-04-13: qty 1

## 2015-04-13 MED ORDER — DIPHENHYDRAMINE HCL 25 MG PO CAPS
50.0000 mg | ORAL_CAPSULE | Freq: Once | ORAL | Status: AC
  Administered 2015-04-13: 50 mg via ORAL
  Filled 2015-04-13: qty 2

## 2015-04-13 MED ORDER — PREDNISONE 20 MG PO TABS
60.0000 mg | ORAL_TABLET | Freq: Once | ORAL | Status: AC
  Administered 2015-04-13: 60 mg via ORAL
  Filled 2015-04-13: qty 3

## 2015-04-13 MED ORDER — HYDROXYZINE HCL 10 MG PO TABS: 10.0000 mg | ORAL_TABLET | Freq: Three times a day (TID) | ORAL | Status: DC | PRN

## 2015-04-13 MED ORDER — FAMOTIDINE 20 MG PO TABS: 20.0000 mg | ORAL_TABLET | Freq: Two times a day (BID) | ORAL | Status: DC

## 2015-04-13 MED ORDER — PREDNISONE 20 MG PO TABS: ORAL_TABLET | ORAL | Status: DC

## 2015-04-13 NOTE — Discharge Instructions (Signed)

## 2015-04-13 NOTE — ED Notes (Signed)
Pt c/o bilateral leg swelling with rash to bilateral legs and arms since yesterday. CMS intact. Pt presents with non pitting swelling to both legs. Legs are tender to touch, pt states they itch with a lot of pressure. Pt denies shortness of breath.
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
## 2015-04-13 NOTE — ED Notes (Signed)
Pt rates her pain to her legs at 10/10, pt is angry because she wants a prescription for pain medications. NP aware and speaking with patient.

## 2015-04-22 ENCOUNTER — Telehealth (HOSPITAL_COMMUNITY): Payer: Self-pay

## 2015-04-26 NOTE — Telephone Encounter (Signed)
Telephone call from patient stating she needed her Adderall filled by 04/28/15 but she cannot afford to get requested lab work done, stating "I am on a fixed income and I just cannot afford it".  Patient stated she still needed her Adderall and wanted this nurse to request from Dr. Lolly Mustache even though at this time she reports she cannot afford lab work he is requesting she must have to remain on medications.  Patient would like for Dr. Lolly Mustache to consider and to let her know if he will be willing to fill requested medication this week.

## 2015-04-27 NOTE — Telephone Encounter (Signed)
Met with Dr. Adele Schilder who stated he would not refill patient's medication until she gets her needed lab work as she keeps telling him she will get it done.  Patient agreed to have this completed on tomorrow, 04/28/15 and then will follow up for refills once lab results received.

## 2015-04-28 ENCOUNTER — Other Ambulatory Visit (HOSPITAL_COMMUNITY): Payer: Self-pay

## 2015-04-28 ENCOUNTER — Other Ambulatory Visit (HOSPITAL_COMMUNITY): Payer: Self-pay | Admitting: Psychiatry

## 2015-04-28 DIAGNOSIS — Z79899 Other long term (current) drug therapy: Secondary | ICD-10-CM | POA: Diagnosis not present

## 2015-04-28 DIAGNOSIS — F332 Major depressive disorder, recurrent severe without psychotic features: Secondary | ICD-10-CM | POA: Diagnosis not present

## 2015-04-28 DIAGNOSIS — F988 Other specified behavioral and emotional disorders with onset usually occurring in childhood and adolescence: Secondary | ICD-10-CM

## 2015-04-28 NOTE — Telephone Encounter (Signed)
We are waiting for lab results.

## 2015-04-28 NOTE — Telephone Encounter (Signed)
Medication refill requests - called and stated she got her lab work done this morning as directed but is now out of Adderall and requests a refill. Patient returns for next evaluation on 07/08/15.

## 2015-04-29 ENCOUNTER — Telehealth (HOSPITAL_COMMUNITY): Payer: Self-pay

## 2015-04-29 LAB — CBC WITH DIFFERENTIAL/PLATELET
Basophils Absolute: 0.1 10*3/uL (ref 0.0–0.1)
Basophils Relative: 1 % (ref 0–1)
Eosinophils Absolute: 0.2 10*3/uL (ref 0.0–0.7)
Eosinophils Relative: 2 % (ref 0–5)
HCT: 45.7 % (ref 36.0–46.0)
Hemoglobin: 15.1 g/dL — ABNORMAL HIGH (ref 12.0–15.0)
LYMPHS ABS: 2.3 10*3/uL (ref 0.7–4.0)
Lymphocytes Relative: 26 % (ref 12–46)
MCH: 30.4 pg (ref 26.0–34.0)
MCHC: 33 g/dL (ref 30.0–36.0)
MCV: 92.1 fL (ref 78.0–100.0)
MONOS PCT: 6 % (ref 3–12)
MPV: 10.3 fL (ref 8.6–12.4)
Monocytes Absolute: 0.5 10*3/uL (ref 0.1–1.0)
NEUTROS PCT: 65 % (ref 43–77)
Neutro Abs: 5.9 10*3/uL (ref 1.7–7.7)
Platelets: 228 10*3/uL (ref 150–400)
RBC: 4.96 MIL/uL (ref 3.87–5.11)
RDW: 13.3 % (ref 11.5–15.5)
WBC: 9 10*3/uL (ref 4.0–10.5)

## 2015-04-29 LAB — COMPLETE METABOLIC PANEL WITH GFR
ALK PHOS: 53 U/L (ref 39–117)
ALT: <8 U/L (ref 0–35)
AST: 10 U/L (ref 0–37)
Albumin: 3.1 g/dL — ABNORMAL LOW (ref 3.5–5.2)
BUN: 10 mg/dL (ref 6–23)
CO2: 26 mEq/L (ref 19–32)
CREATININE: 0.88 mg/dL (ref 0.50–1.10)
Calcium: 8.4 mg/dL (ref 8.4–10.5)
Chloride: 109 mEq/L (ref 96–112)
GFR, Est African American: >89 mL/min
GFR, Est Non African American: 80 mL/min
Glucose, Bld: 114 mg/dL — ABNORMAL HIGH (ref 70–99)
Potassium: 3.8 mEq/L (ref 3.5–5.3)
Sodium: 142 mEq/L (ref 135–145)
Total Bilirubin: 0.3 mg/dL (ref 0.2–1.2)
Total Protein: 5 g/dL — ABNORMAL LOW (ref 6.0–8.3)

## 2015-04-29 LAB — DRUG SCREEN, URINE
Amphetamine Screen, Ur: NEGATIVE
Barbiturate Quant, Ur: NEGATIVE
Benzodiazepines.: NEGATIVE
COCAINE METABOLITES: NEGATIVE
CREATININE, U: 109.97 mg/dL
MARIJUANA METABOLITE: NEGATIVE
Methadone: NEGATIVE
OPIATES: NEGATIVE
Phencyclidine (PCP): NEGATIVE
Propoxyphene: NEGATIVE

## 2015-04-29 LAB — HEMOGLOBIN A1C
HEMOGLOBIN A1C: 5.4 % (ref <5.7)
MEAN PLASMA GLUCOSE: 108 mg/dL (ref <117)

## 2015-04-29 LAB — TSH: TSH: 1.186 u[IU]/mL (ref 0.350–4.500)

## 2015-04-29 MED ORDER — AMPHETAMINE-DEXTROAMPHETAMINE 10 MG PO TABS: ORAL_TABLET | ORAL | Status: DC

## 2015-04-29 NOTE — Telephone Encounter (Signed)
Met with Dr. Adele Schilder who reviewed patient's lab work and agreed to call patient back and to give 2 prescriptions for her Adderall, one to fill today and one in 30 days but also noted patient UDS not positive for any drugs or stimulants.  Called patient to inform orders would be prepared as approved to be filled by Dr. Adele Schilder and questioned if patient was still taking Adderall as was not positive on UDS.  Patient stated several times "I have not had it in a few days but I am taking it".  Patient to pick up new orders later today. New orders prepared and signed and left for patient to pick up this date.

## 2015-04-29 NOTE — Telephone Encounter (Signed)
Rebecca Andrade picked up her prescription on 7/78/24 [facility] [id number]  dlo

## 2015-06-22 ENCOUNTER — Telehealth (HOSPITAL_COMMUNITY): Payer: Self-pay

## 2015-06-22 NOTE — Telephone Encounter (Signed)
Medicaiton refill request for Adderal - Patient reported she will need a new order by 06/28/15 as will run out that date and does not return to see provider until 07/08/15.  Last could be filled on 05/29/15.

## 2015-06-24 ENCOUNTER — Other Ambulatory Visit (HOSPITAL_COMMUNITY): Payer: Self-pay | Admitting: Psychiatry

## 2015-06-24 DIAGNOSIS — F988 Other specified behavioral and emotional disorders with onset usually occurring in childhood and adolescence: Secondary | ICD-10-CM

## 2015-06-24 MED ORDER — AMPHETAMINE-DEXTROAMPHETAMINE 10 MG PO TABS: ORAL_TABLET | ORAL | Status: DC

## 2015-06-25 ENCOUNTER — Telehealth (HOSPITAL_COMMUNITY): Payer: Self-pay

## 2015-06-25 NOTE — Telephone Encounter (Signed)
06/25/15 8:42am Patient came and pick-up rx script [id number].Rebecca KitchenMarguerite Andrade

## 2015-07-08 ENCOUNTER — Ambulatory Visit (INDEPENDENT_AMBULATORY_CARE_PROVIDER_SITE_OTHER): Payer: Medicare Other | Admitting: Psychiatry

## 2015-07-08 ENCOUNTER — Encounter (HOSPITAL_COMMUNITY): Payer: Self-pay | Admitting: Psychiatry

## 2015-07-08 VITALS — BP 98/68 | HR 83 | Ht 67.0 in | Wt 182.2 lb

## 2015-07-08 DIAGNOSIS — F988 Other specified behavioral and emotional disorders with onset usually occurring in childhood and adolescence: Secondary | ICD-10-CM

## 2015-07-08 DIAGNOSIS — F332 Major depressive disorder, recurrent severe without psychotic features: Secondary | ICD-10-CM | POA: Diagnosis not present

## 2015-07-08 DIAGNOSIS — F909 Attention-deficit hyperactivity disorder, unspecified type: Secondary | ICD-10-CM | POA: Diagnosis not present

## 2015-07-08 MED ORDER — QUETIAPINE FUMARATE 100 MG PO TABS: 100.0000 mg | ORAL_TABLET | Freq: Every day | ORAL | Status: DC

## 2015-07-08 MED ORDER — BUSPIRONE HCL 5 MG PO TABS: 5.0000 mg | ORAL_TABLET | Freq: Three times a day (TID) | ORAL | Status: DC

## 2015-07-08 MED ORDER — AMPHETAMINE-DEXTROAMPHETAMINE 10 MG PO TABS: ORAL_TABLET | ORAL | Status: DC

## 2015-07-08 MED ORDER — FLUOXETINE HCL 40 MG PO CAPS: 40.0000 mg | ORAL_CAPSULE | Freq: Every day | ORAL | Status: DC
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
## 2015-09-15 DIAGNOSIS — Z23 Encounter for immunization: Secondary | ICD-10-CM | POA: Diagnosis not present

## 2015-09-20 ENCOUNTER — Telehealth (HOSPITAL_COMMUNITY): Payer: Self-pay

## 2015-09-20 ENCOUNTER — Other Ambulatory Visit (HOSPITAL_COMMUNITY): Payer: Self-pay | Admitting: Psychiatry

## 2015-09-20 DIAGNOSIS — F988 Other specified behavioral and emotional disorders with onset usually occurring in childhood and adolescence: Secondary | ICD-10-CM

## 2015-09-20 MED ORDER — AMPHETAMINE-DEXTROAMPHETAMINE 10 MG PO TABS: ORAL_TABLET | ORAL | Status: DC

## 2015-09-20 NOTE — Telephone Encounter (Signed)
Medication refill request - Pt. called requesting a new Adderall order as states she will need to have a new order to fill on 09/29/15 as last one filled 08/29/15 and does not return until 10/07/15.  Agreed to request from Dr. Lolly MustacheArfeen and will call back once medication order prepared for pick up.

## 2015-09-21 ENCOUNTER — Telehealth (HOSPITAL_COMMUNITY): Payer: Self-pay

## 2015-09-21 NOTE — Telephone Encounter (Signed)
Anushka picked up her prescription on 11/91/4711/15/16 dlo  Lic  [id number]  dlo

## 2015-10-05 ENCOUNTER — Telehealth: Payer: Self-pay

## 2015-10-05 ENCOUNTER — Ambulatory Visit: Payer: Medicare Other | Admitting: Neurology

## 2015-10-05 NOTE — Telephone Encounter (Signed)
Patient canceled appointment the day of the appointment.  

## 2015-10-07 ENCOUNTER — Ambulatory Visit (INDEPENDENT_AMBULATORY_CARE_PROVIDER_SITE_OTHER): Payer: Medicare Other | Admitting: Psychiatry

## 2015-10-07 ENCOUNTER — Encounter (HOSPITAL_COMMUNITY): Payer: Self-pay | Admitting: Psychiatry

## 2015-10-07 VITALS — BP 98/64 | HR 81 | Ht 67.0 in | Wt 193.4 lb

## 2015-10-07 DIAGNOSIS — F332 Major depressive disorder, recurrent severe without psychotic features: Secondary | ICD-10-CM

## 2015-10-07 DIAGNOSIS — F909 Attention-deficit hyperactivity disorder, unspecified type: Secondary | ICD-10-CM

## 2015-10-07 DIAGNOSIS — F988 Other specified behavioral and emotional disorders with onset usually occurring in childhood and adolescence: Secondary | ICD-10-CM

## 2015-10-07 MED ORDER — QUETIAPINE FUMARATE 100 MG PO TABS: 100.0000 mg | ORAL_TABLET | Freq: Every day | ORAL | Status: DC

## 2015-10-07 MED ORDER — AMPHETAMINE-DEXTROAMPHETAMINE 10 MG PO TABS: ORAL_TABLET | ORAL | Status: DC

## 2015-10-07 MED ORDER — FLUOXETINE HCL 40 MG PO CAPS: 40.0000 mg | ORAL_CAPSULE | Freq: Every day | ORAL | Status: DC

## 2015-10-07 MED ORDER — BUSPIRONE HCL 5 MG PO TABS: 5.0000 mg | ORAL_TABLET | Freq: Three times a day (TID) | ORAL | Status: DC
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
## 2015-11-19 ENCOUNTER — Telehealth: Payer: Self-pay | Admitting: Neurology

## 2015-11-19 ENCOUNTER — Encounter: Payer: Self-pay | Admitting: Neurology

## 2015-11-19 ENCOUNTER — Ambulatory Visit (INDEPENDENT_AMBULATORY_CARE_PROVIDER_SITE_OTHER): Payer: Self-pay | Admitting: Neurology

## 2015-11-19 VITALS — BP 102/75 | HR 88 | Ht 67.0 in | Wt 197.5 lb

## 2015-11-19 DIAGNOSIS — R42 Dizziness and giddiness: Secondary | ICD-10-CM

## 2015-11-19 DIAGNOSIS — G43019 Migraine without aura, intractable, without status migrainosus: Secondary | ICD-10-CM

## 2015-11-19 DIAGNOSIS — E538 Deficiency of other specified B group vitamins: Secondary | ICD-10-CM

## 2015-11-19 DIAGNOSIS — H532 Diplopia: Secondary | ICD-10-CM

## 2015-11-19 HISTORY — DX: Diplopia: H53.2

## 2015-11-19 NOTE — Patient Instructions (Signed)
Diplopia °Diplopia is the condition of having double vision or seeing two of a single object. There are many causes of diplopia. Some are not dangerous and can be easily corrected. Diplopia may also be a symptom of a serious medical problem. °There are two types of diplopia. °· Monocular diplopia. This is double vision that affects only one eye. Monocular diplopia is often caused by a clouding of the lens in your eye (cataract) or by disruptions in the way that your eye focuses light. °· Binocular diplopia. This is double vision that affects both eyes. However, when you shut one eye, the double vision will go away. Binocular diplopia may be more serious. It can be caused by: °¨ Problems with the nerves or muscles that are responsible for eye movement. °¨ Neurologic diseases. °¨ Thyroid problems. °¨ Tumors. °¨ An infection near your eyes. °¨ A stroke. °You may need to see a health care provider who specializes in eye conditions (ophthalmologist) or a nerve specialist (neurologist) to find the cause. °HOME CARE INSTRUCTIONS °· Tell your health care provider about any changes in your vision. °· Do not drive or operate heavy machinery if diplopia interferes with your vision. °· Keep all follow-up visits as directed by your health care provider. This is important. °SEEK MEDICAL CARE IF: °· Your diplopia gets worse. °· You develop any other symptoms along with your diplopia, such as: °¨ Weakness. °¨ Numbness. °¨ Headache. °¨ Eye pain. °¨ Clumsiness. °¨ Nausea. °¨ Drooping eyelids. °¨ Abnormal movement of one of your eyes. °SEEK IMMEDIATE MEDICAL CARE IF: °· You have sudden vision loss. °· You suddenly get a very bad headache. °· You have sudden weakness or numbness. °· You suddenly lose the ability to speak, understand speech, or both. °  °This information is not intended to replace advice given to you by your health care provider. Make sure you discuss any questions you have with your health care provider. °  °Document  Released: 08/24/2004 Document Revised: 03/09/2015 Document Reviewed: 09/16/2014 °Elsevier Interactive Patient Education ©2016 [employer] ° °

## 2015-11-19 NOTE — Telephone Encounter (Signed)
Pt called inquiring if Dr Anne HahnWillis was to prescribe RX for dizziness. She said he did not give her RX today at [facility].
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
## 2015-11-19 NOTE — Telephone Encounter (Signed)
I called patient. She wanted a medication for the dizziness, but the dizziness is related to the episodes of double vision. The focus should be on finding out why the episodes of double vision are occurring. I suspect that the patient has latent strabismus, the blood work likely will be normal.

## 2015-11-23 LAB — RPR: RPR Ser Ql: NONREACTIVE

## 2015-11-23 LAB — ANA W/REFLEX: ANA: NEGATIVE

## 2015-11-23 LAB — VITAMIN B12: VITAMIN B 12: 391 pg/mL (ref 211–946)

## 2015-11-23 LAB — TSH: TSH: 1.74 u[IU]/mL (ref 0.450–4.500)

## 2015-11-23 LAB — SEDIMENTATION RATE: Sed Rate: 19 mm/hr (ref 0–32)

## 2015-11-23 LAB — B. BURGDORFI ANTIBODIES: Lyme IgG/IgM Ab: <0.91 {ISR} (ref 0.00–0.90)

## 2015-11-23 LAB — ANGIOTENSIN CONVERTING ENZYME: Angio Convert Enzyme: 37 U/L (ref 14–82)

## 2015-11-23 LAB — ACETYLCHOLINE RECEPTOR, BINDING

## 2015-12-24 DIAGNOSIS — H5210 Myopia, unspecified eye: Secondary | ICD-10-CM | POA: Diagnosis not present

## 2015-12-24 DIAGNOSIS — H5211 Myopia, right eye: Secondary | ICD-10-CM | POA: Diagnosis not present

## 2015-12-28 ENCOUNTER — Other Ambulatory Visit (HOSPITAL_COMMUNITY): Payer: Self-pay | Admitting: Psychiatry

## 2015-12-28 ENCOUNTER — Telehealth (HOSPITAL_COMMUNITY): Payer: Self-pay

## 2015-12-28 DIAGNOSIS — F988 Other specified behavioral and emotional disorders with onset usually occurring in childhood and adolescence: Secondary | ICD-10-CM

## 2015-12-28 MED ORDER — AMPHETAMINE-DEXTROAMPHETAMINE 10 MG PO TABS: ORAL_TABLET | ORAL | Status: DC

## 2015-12-28 NOTE — Telephone Encounter (Signed)
Patient is calling for a refill on her adderall. She was last here on 12/1 and has a f/u on 3/2. Patient last filled rx on 1/23. Okay to refill? Please advise, thank you

## 2015-12-29 ENCOUNTER — Telehealth (HOSPITAL_COMMUNITY): Payer: Self-pay

## 2015-12-29 NOTE — Telephone Encounter (Signed)
Patient picked up prescription on 02/13/80  lic [id number]  dlo

## 2016-01-06 ENCOUNTER — Ambulatory Visit (INDEPENDENT_AMBULATORY_CARE_PROVIDER_SITE_OTHER): Payer: Medicare HMO | Admitting: Psychiatry

## 2016-01-06 ENCOUNTER — Encounter (HOSPITAL_COMMUNITY): Payer: Self-pay | Admitting: Psychiatry

## 2016-01-06 VITALS — BP 104/66 | HR 87 | Ht 67.0 in | Wt 200.6 lb

## 2016-01-06 DIAGNOSIS — F909 Attention-deficit hyperactivity disorder, unspecified type: Secondary | ICD-10-CM | POA: Diagnosis not present

## 2016-01-06 DIAGNOSIS — F988 Other specified behavioral and emotional disorders with onset usually occurring in childhood and adolescence: Secondary | ICD-10-CM

## 2016-01-06 DIAGNOSIS — F332 Major depressive disorder, recurrent severe without psychotic features: Secondary | ICD-10-CM

## 2016-01-06 MED ORDER — FLUOXETINE HCL 40 MG PO CAPS: 40.0000 mg | ORAL_CAPSULE | Freq: Every day | ORAL | Status: DC

## 2016-01-06 MED ORDER — AMPHETAMINE-DEXTROAMPHETAMINE 10 MG PO TABS: ORAL_TABLET | ORAL | Status: DC

## 2016-01-06 MED ORDER — BUSPIRONE HCL 5 MG PO TABS: 5.0000 mg | ORAL_TABLET | Freq: Three times a day (TID) | ORAL | Status: DC

## 2016-01-06 MED ORDER — QUETIAPINE FUMARATE 100 MG PO TABS: 100.0000 mg | ORAL_TABLET | Freq: Every day | ORAL | Status: DC
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
## 2016-03-13 ENCOUNTER — Other Ambulatory Visit (HOSPITAL_COMMUNITY): Payer: Self-pay | Admitting: Psychiatry

## 2016-03-21 ENCOUNTER — Telehealth (HOSPITAL_COMMUNITY): Payer: Self-pay

## 2016-03-21 ENCOUNTER — Other Ambulatory Visit (HOSPITAL_COMMUNITY): Payer: Self-pay | Admitting: Psychiatry

## 2016-03-21 DIAGNOSIS — F988 Other specified behavioral and emotional disorders with onset usually occurring in childhood and adolescence: Secondary | ICD-10-CM

## 2016-03-21 MED ORDER — AMPHETAMINE-DEXTROAMPHETAMINE 10 MG PO TABS: ORAL_TABLET | ORAL | Status: DC

## 2016-03-21 NOTE — Telephone Encounter (Signed)
Patient is calling for a refill on adderall - she was last here on 3/2 and has a follow up on 6/1. She is due - okay to refill? Please review and advise, thank you

## 2016-03-22 ENCOUNTER — Telehealth (HOSPITAL_COMMUNITY): Payer: Self-pay

## 2016-03-22 NOTE — Telephone Encounter (Signed)
Rebecca Andrade picked up her prescription on 1/61/095/17/17  lic  [phone]  dlo

## 2016-03-29 ENCOUNTER — Other Ambulatory Visit (HOSPITAL_COMMUNITY): Payer: Self-pay | Admitting: Psychiatry

## 2016-04-06 ENCOUNTER — Ambulatory Visit (INDEPENDENT_AMBULATORY_CARE_PROVIDER_SITE_OTHER): Payer: Medicare HMO | Admitting: Psychiatry

## 2016-04-06 ENCOUNTER — Encounter (HOSPITAL_COMMUNITY): Payer: Self-pay | Admitting: Psychiatry

## 2016-04-06 ENCOUNTER — Other Ambulatory Visit (HOSPITAL_COMMUNITY): Payer: Self-pay | Admitting: Psychiatry

## 2016-04-06 VITALS — BP 126/72 | HR 90 | Ht 67.0 in | Wt 199.2 lb

## 2016-04-06 DIAGNOSIS — F988 Other specified behavioral and emotional disorders with onset usually occurring in childhood and adolescence: Secondary | ICD-10-CM

## 2016-04-06 DIAGNOSIS — F909 Attention-deficit hyperactivity disorder, unspecified type: Secondary | ICD-10-CM | POA: Diagnosis not present

## 2016-04-06 DIAGNOSIS — F332 Major depressive disorder, recurrent severe without psychotic features: Secondary | ICD-10-CM | POA: Diagnosis not present

## 2016-04-06 MED ORDER — FLUOXETINE HCL 40 MG PO CAPS: 40.0000 mg | ORAL_CAPSULE | Freq: Every day | ORAL | Status: DC

## 2016-04-06 MED ORDER — QUETIAPINE FUMARATE 100 MG PO TABS: 100.0000 mg | ORAL_TABLET | Freq: Every day | ORAL | Status: DC

## 2016-04-06 MED ORDER — AMPHETAMINE-DEXTROAMPHETAMINE 10 MG PO TABS: ORAL_TABLET | ORAL | Status: DC

## 2016-04-06 MED ORDER — BUSPIRONE HCL 10 MG PO TABS: 10.0000 mg | ORAL_TABLET | Freq: Two times a day (BID) | ORAL | Status: DC
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
## 2016-04-09 ENCOUNTER — Encounter (HOSPITAL_COMMUNITY): Payer: Self-pay

## 2016-04-09 DIAGNOSIS — Z3202 Encounter for pregnancy test, result negative: Secondary | ICD-10-CM | POA: Insufficient documentation

## 2016-04-09 DIAGNOSIS — Z87442 Personal history of urinary calculi: Secondary | ICD-10-CM | POA: Insufficient documentation

## 2016-04-09 DIAGNOSIS — Z8679 Personal history of other diseases of the circulatory system: Secondary | ICD-10-CM | POA: Diagnosis not present

## 2016-04-09 DIAGNOSIS — Z8639 Personal history of other endocrine, nutritional and metabolic disease: Secondary | ICD-10-CM | POA: Insufficient documentation

## 2016-04-09 DIAGNOSIS — F419 Anxiety disorder, unspecified: Secondary | ICD-10-CM | POA: Diagnosis not present

## 2016-04-09 DIAGNOSIS — R109 Unspecified abdominal pain: Secondary | ICD-10-CM | POA: Insufficient documentation

## 2016-04-09 DIAGNOSIS — Z79899 Other long term (current) drug therapy: Secondary | ICD-10-CM | POA: Insufficient documentation

## 2016-04-09 DIAGNOSIS — F329 Major depressive disorder, single episode, unspecified: Secondary | ICD-10-CM | POA: Insufficient documentation

## 2016-04-09 DIAGNOSIS — Z8669 Personal history of other diseases of the nervous system and sense organs: Secondary | ICD-10-CM | POA: Diagnosis not present

## 2016-04-09 DIAGNOSIS — F909 Attention-deficit hyperactivity disorder, unspecified type: Secondary | ICD-10-CM | POA: Diagnosis not present

## 2016-04-09 DIAGNOSIS — N189 Chronic kidney disease, unspecified: Secondary | ICD-10-CM | POA: Diagnosis not present

## 2016-04-09 LAB — URINE MICROSCOPIC-ADD ON

## 2016-04-09 LAB — URINALYSIS, ROUTINE W REFLEX MICROSCOPIC
BILIRUBIN URINE: NEGATIVE
Glucose, UA: NEGATIVE mg/dL
KETONES UR: NEGATIVE mg/dL
Nitrite: NEGATIVE
PH: 6 (ref 5.0–8.0)
Protein, ur: 30 mg/dL — AB
SPECIFIC GRAVITY, URINE: 1.021 (ref 1.005–1.030)

## 2016-04-09 NOTE — ED Notes (Signed)
Pt complaining of right flank pain. Pt complaining of difficulty urinating. Hx kidney stones, states feels similar.

## 2016-04-10 ENCOUNTER — Emergency Department (HOSPITAL_COMMUNITY): Payer: Commercial Managed Care - HMO

## 2016-04-10 ENCOUNTER — Emergency Department (HOSPITAL_COMMUNITY)
Admission: EM | Admit: 2016-04-10 | Discharge: 2016-04-10 | Disposition: A | Discharge status: Home / Self Care | Payer: Commercial Managed Care - HMO | Attending: Emergency Medicine | Admitting: Emergency Medicine

## 2016-04-10 DIAGNOSIS — R109 Unspecified abdominal pain: Secondary | ICD-10-CM

## 2016-04-10 LAB — I-STAT CHEM 8, ED
BUN: 16 mg/dL (ref 6–20)
CALCIUM ION: 1.18 mmol/L (ref 1.12–1.23)
CREATININE: 1 mg/dL (ref 0.44–1.00)
Chloride: 104 mmol/L (ref 101–111)
Glucose, Bld: 86 mg/dL (ref 65–99)
HCT: 47 % — ABNORMAL HIGH (ref 36.0–46.0)
HEMOGLOBIN: 16 g/dL — AB (ref 12.0–15.0)
Potassium: 4.1 mmol/L (ref 3.5–5.1)
SODIUM: 142 mmol/L (ref 135–145)
TCO2: 27 mmol/L (ref 0–100)

## 2016-04-10 LAB — PREGNANCY, URINE: Preg Test, Ur: NEGATIVE

## 2016-04-10 MED ORDER — ONDANSETRON HCL 4 MG PO TABS: 4.0000 mg | ORAL_TABLET | Freq: Four times a day (QID) | ORAL | Status: DC

## 2016-04-10 MED ORDER — ONDANSETRON HCL 4 MG/2ML IJ SOLN
4.0000 mg | Freq: Once | INTRAMUSCULAR | Status: AC
  Administered 2016-04-10: 4 mg via INTRAVENOUS
  Filled 2016-04-10: qty 2

## 2016-04-10 MED ORDER — SULFAMETHOXAZOLE-TRIMETHOPRIM 800-160 MG PO TABS: 1.0000 | ORAL_TABLET | Freq: Two times a day (BID) | ORAL | Status: AC

## 2016-04-10 MED ORDER — OXYCODONE-ACETAMINOPHEN 5-325 MG PO TABS: 1.0000 | ORAL_TABLET | ORAL | Status: DC | PRN

## 2016-04-10 MED ORDER — HYDROMORPHONE HCL 1 MG/ML IJ SOLN
1.0000 mg | Freq: Once | INTRAMUSCULAR | Status: AC
  Administered 2016-04-10: 1 mg via INTRAVENOUS
  Filled 2016-04-10: qty 1

## 2016-04-10 NOTE — ED Notes (Signed)
Pt departed in NAD.  

## 2016-04-10 NOTE — ED Notes (Signed)
Patient transported to CT 
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
## 2016-04-10 NOTE — Discharge Instructions (Signed)
Flank Pain °Flank pain refers to pain that is located on the side of the body between the upper abdomen and the back. The pain may occur over a short period of time (acute) or may be long-term or reoccurring (chronic). It may be mild or severe. Flank pain can be caused by many things. °CAUSES  °Some of the more common causes of flank pain include: °· Muscle strains.   °· Muscle spasms.   °· A disease of your spine (vertebral disk disease).   °· A lung infection (pneumonia).   °· Fluid around your lungs (pulmonary edema).   °· A kidney infection.   °· Kidney stones.   °· A very painful skin rash caused by the chickenpox virus (shingles).   °· Gallbladder disease.   °HOME CARE INSTRUCTIONS  °Home care will depend on the cause of your pain. In general, °· Rest as directed by your caregiver. °· Drink enough fluids to keep your urine clear or pale yellow. °· Only take over-the-counter or prescription medicines as directed by your caregiver. Some medicines may help relieve the pain. °· Tell your caregiver about any changes in your pain. °· Follow up with your caregiver as directed. °SEEK IMMEDIATE MEDICAL CARE IF:  °· Your pain is not controlled with medicine.   °· You have new or worsening symptoms. °· Your pain increases.   °· You have abdominal pain.   °· You have shortness of breath.   °· You have persistent nausea or vomiting.   °· You have swelling in your abdomen.   °· You feel faint or pass out.   °· You have blood in your urine. °· You have a fever or persistent symptoms for more than 2-3 days. °· You have a fever and your symptoms suddenly get worse. °MAKE SURE YOU:  °· Understand these instructions. °· Will watch your condition. °· Will get help right away if you are not doing well or get worse. °  °This information is not intended to replace advice given to you by your health care provider. Make sure you discuss any questions you have with your health care provider. °  °Document Released: 12/14/2005 Document  Revised: 07/17/2012 Document Reviewed: 06/06/2012 °Elsevier Interactive Patient Education ©2016 [employer] ° °

## 2016-04-10 NOTE — ED Notes (Signed)
Pt reports returning nausea. Informed Blinda LeatherwoodPollina, MD

## 2016-04-11 LAB — URINE CULTURE

## 2016-04-13 ENCOUNTER — Encounter (HOSPITAL_COMMUNITY): Payer: Self-pay | Admitting: Emergency Medicine

## 2016-04-13 DIAGNOSIS — N189 Chronic kidney disease, unspecified: Secondary | ICD-10-CM | POA: Insufficient documentation

## 2016-04-13 DIAGNOSIS — F419 Anxiety disorder, unspecified: Secondary | ICD-10-CM | POA: Diagnosis not present

## 2016-04-13 DIAGNOSIS — F329 Major depressive disorder, single episode, unspecified: Secondary | ICD-10-CM | POA: Insufficient documentation

## 2016-04-13 DIAGNOSIS — Z8669 Personal history of other diseases of the nervous system and sense organs: Secondary | ICD-10-CM | POA: Insufficient documentation

## 2016-04-13 DIAGNOSIS — Z87828 Personal history of other (healed) physical injury and trauma: Secondary | ICD-10-CM | POA: Insufficient documentation

## 2016-04-13 DIAGNOSIS — Z8679 Personal history of other diseases of the circulatory system: Secondary | ICD-10-CM | POA: Diagnosis not present

## 2016-04-13 DIAGNOSIS — F909 Attention-deficit hyperactivity disorder, unspecified type: Secondary | ICD-10-CM | POA: Insufficient documentation

## 2016-04-13 DIAGNOSIS — Z79899 Other long term (current) drug therapy: Secondary | ICD-10-CM | POA: Diagnosis not present

## 2016-04-13 DIAGNOSIS — N200 Calculus of kidney: Secondary | ICD-10-CM | POA: Diagnosis not present

## 2016-04-13 DIAGNOSIS — R1084 Generalized abdominal pain: Secondary | ICD-10-CM | POA: Diagnosis present

## 2016-04-13 LAB — URINE MICROSCOPIC-ADD ON: WBC UA: NONE SEEN WBC/hpf (ref 0–5)

## 2016-04-13 LAB — URINALYSIS, ROUTINE W REFLEX MICROSCOPIC
BILIRUBIN URINE: NEGATIVE
Glucose, UA: NEGATIVE mg/dL
Ketones, ur: NEGATIVE mg/dL
LEUKOCYTES UA: NEGATIVE
Nitrite: NEGATIVE
PROTEIN: NEGATIVE mg/dL
SPECIFIC GRAVITY, URINE: 1.015 (ref 1.005–1.030)
pH: 6 (ref 5.0–8.0)

## 2016-04-13 NOTE — ED Notes (Signed)
The patient was seen here a couple of days ago for the same complaint.  She is having abdominal pain and back pain.  She said she cannot pee and was told to see a urologist.  She called her urologist, Dr. Julien GirtNessi and no one called her back.  She is here for the same thing.  The patient rates his pain 10/10.  She has taken two percocets and it is not helping.

## 2016-04-14 ENCOUNTER — Other Ambulatory Visit: Payer: Self-pay | Admitting: Urology

## 2016-04-14 ENCOUNTER — Emergency Department (HOSPITAL_COMMUNITY)
Admission: EM | Admit: 2016-04-14 | Discharge: 2016-04-14 | Disposition: A | Discharge status: Home / Self Care | Payer: Commercial Managed Care - HMO | Attending: Emergency Medicine | Admitting: Emergency Medicine

## 2016-04-14 DIAGNOSIS — N200 Calculus of kidney: Secondary | ICD-10-CM

## 2016-04-14 LAB — CBC WITH DIFFERENTIAL/PLATELET
Basophils Absolute: 0.1 10*3/uL (ref 0.0–0.1)
Basophils Relative: 1 %
EOS ABS: 0.2 10*3/uL (ref 0.0–0.7)
EOS PCT: 2 %
HCT: 41 % (ref 36.0–46.0)
Hemoglobin: 13.4 g/dL (ref 12.0–15.0)
LYMPHS ABS: 2.1 10*3/uL (ref 0.7–4.0)
Lymphocytes Relative: 21 %
MCH: 29.1 pg (ref 26.0–34.0)
MCHC: 32.7 g/dL (ref 30.0–36.0)
MCV: 89.1 fL (ref 78.0–100.0)
MONO ABS: 0.6 10*3/uL (ref 0.1–1.0)
Monocytes Relative: 6 %
Neutro Abs: 7.4 10*3/uL (ref 1.7–7.7)
Neutrophils Relative %: 70 %
PLATELETS: 297 10*3/uL (ref 150–400)
RBC: 4.6 MIL/uL (ref 3.87–5.11)
RDW: 12.2 % (ref 11.5–15.5)
WBC: 10.3 10*3/uL (ref 4.0–10.5)

## 2016-04-14 LAB — I-STAT CHEM 8, ED
BUN: 14 mg/dL (ref 6–20)
CALCIUM ION: 1.17 mmol/L (ref 1.12–1.23)
CHLORIDE: 106 mmol/L (ref 101–111)
Creatinine, Ser: 1.3 mg/dL — ABNORMAL HIGH (ref 0.44–1.00)
GLUCOSE: 121 mg/dL — AB (ref 65–99)
HCT: 40 % (ref 36.0–46.0)
HEMOGLOBIN: 13.6 g/dL (ref 12.0–15.0)
Potassium: 4 mmol/L (ref 3.5–5.1)
Sodium: 141 mmol/L (ref 135–145)
TCO2: 26 mmol/L (ref 0–100)

## 2016-04-14 MED ORDER — PROMETHAZINE HCL 25 MG PO TABS: 25.0000 mg | ORAL_TABLET | Freq: Four times a day (QID) | ORAL | Status: DC | PRN

## 2016-04-14 MED ORDER — LORAZEPAM 2 MG/ML IJ SOLN
1.0000 mg | Freq: Once | INTRAMUSCULAR | Status: AC
  Administered 2016-04-14: 1 mg via INTRAVENOUS
  Filled 2016-04-14: qty 1

## 2016-04-14 MED ORDER — ONDANSETRON HCL 4 MG/2ML IJ SOLN
4.0000 mg | Freq: Once | INTRAMUSCULAR | Status: AC
  Administered 2016-04-14: 4 mg via INTRAVENOUS
  Filled 2016-04-14: qty 2

## 2016-04-14 MED ORDER — HYDROMORPHONE HCL 1 MG/ML IJ SOLN
1.0000 mg | Freq: Once | INTRAMUSCULAR | Status: AC
  Administered 2016-04-14: 1 mg via INTRAVENOUS
  Filled 2016-04-14: qty 1

## 2016-04-14 MED ORDER — HYDROMORPHONE HCL 1 MG/ML IJ SOLN
0.5000 mg | Freq: Once | INTRAMUSCULAR | Status: AC
Start: 2016-04-14 — End: 2016-04-14
  Administered 2016-04-14: 0.5 mg via INTRAVENOUS
  Filled 2016-04-14: qty 1

## 2016-04-14 MED ORDER — OXYCODONE-ACETAMINOPHEN 5-325 MG PO TABS: 1.0000 | ORAL_TABLET | ORAL | Status: DC | PRN

## 2016-04-14 MED ORDER — METOCLOPRAMIDE HCL 5 MG/ML IJ SOLN
10.0000 mg | Freq: Once | INTRAMUSCULAR | Status: AC
  Administered 2016-04-14: 10 mg via INTRAVENOUS
  Filled 2016-04-14: qty 2

## 2016-04-14 NOTE — Discharge Instructions (Signed)
Kidney Stones °Kidney stones (urolithiasis) are deposits that form inside your kidneys. The intense pain is caused by the stone moving through the urinary tract. When the stone moves, the ureter goes into spasm around the stone. The stone is usually passed in the urine.  °CAUSES  °· A disorder that makes certain neck glands produce too much parathyroid hormone (primary hyperparathyroidism). °· A buildup of uric acid crystals, similar to gout in your joints. °· Narrowing (stricture) of the ureter. °· A kidney obstruction present at birth (congenital obstruction). °· Previous surgery on the kidney or ureters. °· Numerous kidney infections. °SYMPTOMS  °· Feeling sick to your stomach (nauseous). °· Throwing up (vomiting). °· Blood in the urine (hematuria). °· Pain that usually spreads (radiates) to the groin. °· Frequency or urgency of urination. °DIAGNOSIS  °· Taking a history and physical exam. °· Blood or urine tests. °· CT scan. °· Occasionally, an examination of the inside of the urinary bladder (cystoscopy) is performed. °TREATMENT  °· Observation. °· Increasing your fluid intake. °· Extracorporeal shock wave lithotripsy--This is a noninvasive procedure that uses shock waves to break up kidney stones. °· Surgery may be needed if you have severe pain or persistent obstruction. There are various surgical procedures. Most of the procedures are performed with the use of small instruments. Only small incisions are needed to accommodate these instruments, so recovery time is minimized. °The size, location, and chemical composition are all important variables that will determine the proper choice of action for you. Talk to your health care provider to better understand your situation so that you will minimize the risk of injury to yourself and your kidney.  °HOME CARE INSTRUCTIONS  °· Drink enough water and fluids to keep your urine clear or pale yellow. This will help you to pass the stone or stone fragments. °· Strain  all urine through the provided strainer. Keep all particulate matter and stones for your health care provider to see. The stone causing the pain may be as small as a grain of salt. It is very important to use the strainer each and every time you pass your urine. The collection of your stone will allow your health care provider to analyze it and verify that a stone has actually passed. The stone analysis will often identify what you can do to reduce the incidence of recurrences. °· Only take over-the-counter or prescription medicines for pain, discomfort, or fever as directed by your health care provider. °· Keep all follow-up visits as told by your health care provider. This is important. °· Get follow-up X-rays if required. The absence of pain does not always mean that the stone has passed. It may have only stopped moving. If the urine remains completely obstructed, it can cause loss of kidney function or even complete destruction of the kidney. It is your responsibility to make sure X-rays and follow-ups are completed. Ultrasounds of the kidney can show blockages and the status of the kidney. Ultrasounds are not associated with any radiation and can be performed easily in a matter of minutes. °· Make changes to your daily diet as told by your health care provider. You may be told to: °¨ Limit the amount of salt that you eat. °¨ Eat 5 or more servings of fruits and vegetables each day. °¨ Limit the amount of meat, poultry, fish, and eggs that you eat. °· Collect a 24-hour urine sample as told by your health care provider. You may need to collect another urine sample every 6-12   months. °SEEK MEDICAL CARE IF: °· You experience pain that is progressive and unresponsive to any pain medicine you have been prescribed. °SEEK IMMEDIATE MEDICAL CARE IF:  °· Pain cannot be controlled with the prescribed medicine. °· You have a fever or shaking chills. °· The severity or intensity of pain increases over 18 hours and is not  relieved by pain medicine. °· You develop a new onset of abdominal pain. °· You feel faint or pass out. °· You are unable to urinate. °  °This information is not intended to replace advice given to you by your health care provider. Make sure you discuss any questions you have with your health care provider. °  °Document Released: 10/23/2005 Document Revised: 07/14/2015 Document Reviewed: 03/26/2013 °Elsevier Interactive Patient Education ©2016 Elsevier Inc. ° °
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
## 2016-04-14 NOTE — ED Notes (Signed)
Went to d/c pt, very nauseous and vomitting now. Verbal order by Verdie MosherLiu to give IV zofran.

## 2016-04-14 NOTE — ED Notes (Signed)
The pt has a history of kidney stones  She has not had kidney stones for 6 years until 4 days ago when her abd and flanks started hurting.  She had stopped drinking carbonated drinks and tea but strted back drinking  Those 2 drinks.  She feels like her abd is bloated and she feels like she cannot empty her blader

## 2016-04-14 NOTE — ED Notes (Signed)
Pt taken to restroom, voided 16350ml's.

## 2016-04-14 NOTE — ED Notes (Signed)
The pt is c/o nausea  Med given

## 2016-04-17 ENCOUNTER — Encounter (HOSPITAL_COMMUNITY): Payer: Self-pay | Admitting: *Deleted

## 2016-04-17 ENCOUNTER — Ambulatory Visit (HOSPITAL_COMMUNITY): Payer: Medicare HMO

## 2016-04-17 ENCOUNTER — Ambulatory Visit (HOSPITAL_COMMUNITY)
Admission: RE | Admit: 2016-04-17 | Discharge: 2016-04-17 | Disposition: A | Discharge status: Home / Self Care | Payer: Medicare HMO | Source: Ambulatory Visit | Attending: Urology | Admitting: Urology

## 2016-04-17 ENCOUNTER — Encounter (HOSPITAL_COMMUNITY)
Admission: RE | Disposition: A | Discharge status: Home / Self Care | Payer: Self-pay | Source: Ambulatory Visit | Attending: Urology

## 2016-04-17 ENCOUNTER — Other Ambulatory Visit: Payer: Self-pay | Admitting: Urology

## 2016-04-17 DIAGNOSIS — Z79891 Long term (current) use of opiate analgesic: Secondary | ICD-10-CM | POA: Insufficient documentation

## 2016-04-17 DIAGNOSIS — E669 Obesity, unspecified: Secondary | ICD-10-CM | POA: Insufficient documentation

## 2016-04-17 DIAGNOSIS — Z79899 Other long term (current) drug therapy: Secondary | ICD-10-CM | POA: Insufficient documentation

## 2016-04-17 DIAGNOSIS — N201 Calculus of ureter: Secondary | ICD-10-CM | POA: Insufficient documentation

## 2016-04-17 DIAGNOSIS — N200 Calculus of kidney: Secondary | ICD-10-CM | POA: Diagnosis present

## 2016-04-17 DIAGNOSIS — Z6830 Body mass index (BMI) 30.0-30.9, adult: Secondary | ICD-10-CM | POA: Diagnosis not present

## 2016-04-17 LAB — PREGNANCY, URINE: Preg Test, Ur: NEGATIVE

## 2016-04-17 SURGERY — LITHOTRIPSY, ESWL
Anesthesia: LOCAL | Laterality: Left

## 2016-04-17 MED ORDER — SODIUM CHLORIDE 0.9 % IV SOLN
INTRAVENOUS | Status: DC
  Administered 2016-04-17: 09:00:00 via INTRAVENOUS

## 2016-04-17 MED ORDER — OXYCODONE HCL 10 MG PO TABS: 10.0000 mg | ORAL_TABLET | ORAL | Status: DC | PRN

## 2016-04-17 MED ORDER — TAMSULOSIN HCL 0.4 MG PO CAPS: 0.4000 mg | ORAL_CAPSULE | ORAL | Status: DC

## 2016-04-17 MED ORDER — DIAZEPAM 5 MG PO TABS
10.0000 mg | ORAL_TABLET | ORAL | Status: AC
  Administered 2016-04-17: 10 mg via ORAL
  Filled 2016-04-17: qty 2

## 2016-04-17 MED ORDER — TAMSULOSIN HCL 0.4 MG PO CAPS
0.4000 mg | ORAL_CAPSULE | Freq: Once | ORAL | Status: AC
  Administered 2016-04-17: 0.4 mg via ORAL
  Filled 2016-04-17: qty 1

## 2016-04-17 MED ORDER — CIPROFLOXACIN HCL 500 MG PO TABS
500.0000 mg | ORAL_TABLET | ORAL | Status: AC
  Administered 2016-04-17: 500 mg via ORAL
  Filled 2016-04-17: qty 1

## 2016-04-17 MED ORDER — DIPHENHYDRAMINE HCL 25 MG PO CAPS
25.0000 mg | ORAL_CAPSULE | ORAL | Status: AC
  Administered 2016-04-17: 25 mg via ORAL
  Filled 2016-04-17: qty 1

## 2016-04-17 NOTE — Progress Notes (Addendum)
Lithotripsy unit down. Patient received sedation, recovered in Short Stay. Patient rescheduled for lithotripsy on 04/20/16 at 0845. Instructions reviewed with patient and patient's mother for Thursday. Instructed to call [facility] or Short Stay for further questions. Verbalized understanding.

## 2016-04-17 NOTE — Op Note (Signed)
See [facility] OP note scanned into chart. Also because of the size, density, location and other factors that cannot be anticipated I feel this will likely be a staged procedure. This fact supersedes any indication in the scanned [facility] operative note to the contrary.  

## 2016-04-17 NOTE — H&P (Signed)
Rebecca Andrade  MRN: [id number]  PRIMARY CARE:  Harlan Stains, MD  DOB: [birth year], 45 year old Female  REFERRING:  Harlan Stains, MD  SSN: [id number]  PROVIDER:  Kathie Rhodes, M.D.    TREATING:  Nicolette Bang, M.D.    LOCATION:  [facility] [phone]   --------------------------------------------------------------------------------   CC: I have kidney stones.  HPI: Rebecca Andrade is a 45 year-old female patient who was referred by Dr. Harlan Stains, MD who is here for renal calculi.  The problem is on the left side. She first stated noticing pain on approximately 04/11/2016. She is currently having flank pain, back pain, nausea, and vomiting. She denies having groin pain, fever, and chills. She has not caught a stone in her urine strainer since her symptoms began.   She has had eswl, ureteral stent, and ureteroscopy for treatment of her stones in the past.   04/14/2016: 1cm left renal pelvis calculus. multiple bilateral nonobstructing renal calculi.  She previously had ESWL by Dr. Janice Norrie     CC: I have pain in the flank.  HPI: The problem is on the left side. Her pain started about approximately 04/11/2016. The intensity of her pain is rated as a 9. The pain is intermittent. The pain does not radiate.   None< makes the pain better. Nothing causes the pain to become worse. She was treated with the following pain medication(s): Toradol and Percocet.   She has had this same pain previously. She has had kidney stones.     ALLERGIES: No Known Drug Allergies    MEDICATIONS: Percocet 1 PO Daily  Adderall TABS Oral  Bactrim 1 PO Daily  Buspar 1 PO Daily  Prozac 1 PO Daily  Seroquel 1 PO Daily     GU PSH: Cysto Uretero Lithotripsy - 2010, 2009 Cystoscopy Insert Stent - 2013, 2012, 2010, 2009 Cystoscopy Ureteroscopy - 2012 Renal ESWL - 2010      [place] Notes: Cystoscopy With Insertion Of Ureteral Stent Left, Cystoscopy With Ureteroscopy Left, Cystoscopy With Insertion Of  Ureteral Stent Left, Cystoscopy With Ureteroscopy With Lithotripsy, Cystoscopy With Insertion Of Ureteral Stent Left, Lithotripsy, Cystoscopy With Insertion Of Ureteral Stent Left, Cystoscopy With Ureteroscopy With Lithotripsy, Brain Surgery   NON-GU PSH: None   GU PMH: Calculus Ureter, Calculus of left ureter - 2014, Calculus of ureter, - 2014 Hydronephrosis Unspec, Hydronephrosis - 2014 Kidney Stone, Kidney stone on right side - 2014, Nephrolithiasis, - 2014, Kidney stone on left side, - 2014 Low back pain, Lower back pain - 2014 Urinary Tract Inf, Unspec site, Urinary tract infection - 2014, Pyuria, - 2014    NON-GU PMH: Anxiety disorder, unspecified, Anxiety - 2014 Personal history of other mental and behavioral disorders, History of depression - 2014 Personal history of other specified conditions, History of heartburn - 2014 Encounter for general adult medical examination without abnormal findings, Encounter for preventive health examination    FAMILY HISTORY: Family Health Status - Father's Age - Runs In Family Family Health Status - Mother's Age - Runs In Family Family Health Status Number Of Children - Runs In Family Stroke Syndrome - Mother   SOCIAL HISTORY: Marital Status: Single Patient has never smoked.  Does not drink anymore.  Drinks 3 caffeinated drinks per day. Patient's occupation is/was disabled.     Notes: Never Smoked, Caffeine Use, Alcohol Use, Marital History - Single, Occupation:   REVIEW OF SYSTEMS:    GU Review Female:   Patient denies frequent urination, hard  to postpone urination, burning /pain with urination, get up at night to urinate, leakage of urine, stream starts and stops, trouble starting your stream, have to strain to urinate, and currently pregnant.  Gastrointestinal (Upper):   Patient reports nausea and vomiting.   Gastrointestinal (Lower):   Patient reports constipation.   Constitutional:   Patient reports fatigue. Patient denies fever, night  sweats, and weight loss.  Skin:   Patient denies skin rash/ lesion and itching.  Eyes:   Patient denies double vision and blurred vision.  Ears/ Nose/ Throat:   Patient denies sore throat and sinus problems.  Hematologic/Lymphatic:   Patient denies swollen glands and easy bruising.  Cardiovascular:   Patient denies leg swelling and chest pains.  Respiratory:   Patient denies cough and shortness of breath.  Endocrine:   Patient reports excessive thirst.   Musculoskeletal:   Patient reports back pain and joint pain.   Neurological:   Patient reports headaches and dizziness.   Psychologic:   Patient reports depression and anxiety.    VITAL SIGNS:    Weight: 200 lb/90.7 kg   Height/Length: 67 in / 170 cm   BP: 110/77 mmHg   Pulse: 80 /min   Temp: 98.1 F / 37 C   BMI: 31.3      MULTI-SYSTEM PHYSICAL EXAMINATION:    Constitutional: Well-nourished. No physical deformities. Normally developed. Good grooming.  Neck: Neck symmetrical, not swollen. Normal tracheal position.  Respiratory: No labored breathing, no use of accessory muscles.   Cardiovascular: Normal temperature, normal extremity pulses, no swelling, no varicosities.  Lymphatic: No enlargement of neck, axillae, groin.  Skin: No paleness, no jaundice, no cyanosis. No lesion, no ulcer, no rash.  Neurologic / Psychiatric: Oriented to time, oriented to place, oriented to person. No depression, no anxiety, no agitation.  Gastrointestinal: No mass, no tenderness, no rigidity, non obese abdomen.  Eyes: Normal conjunctivae. Normal eyelids.  Ears, Nose, Mouth, and Throat: Left ear no scars, no lesions, no masses. Right ear no scars, no lesions, no masses. Nose no scars, no lesions, no masses. Normal hearing. Normal lips.  Musculoskeletal: Normal gait and station of head and neck.   PAST DATA REVIEWED:  Source Of History:  Patient  X-Ray Review: C.T. Chest/ABD: Reviewed Films. Reviewed Report. Discussed With Patient.     PROCEDURES:          KUB - 74000  A single view of the abdomen is obtained.               Urinalysis w/Scope - 81001 Dipstick Dipstick Cont'd Micro  Specimen: Voided Blood: Trace Intact WBC/hpf: 0-5/hpf  Appearance: Clear Nitrites: Neg RBC/hpf: 0-2  Color: Yellow Leukocyte Esterase: Trace Bacteria: few (10-25/hpf)  Ketones: 1+        ASSESSMENT:      ICD-10 Details  1 GU:   Kidney Stone - N20.0   2   Flank Pain - R10.84    PLAN:            Medications New Meds: Ketorolac Tromethamine 60 mg/2 ml vial 60 mg IM once   #60  0 Refill(s)  Phenergan 25 mg/ml vial 1 ml IM once   #25  0 Refill(s)            Orders X-Rays: KUB          Schedule         Document Letter(s):  Created for Patient: Clinical Summary    We discussed management strategies of ureteral stones  including medical expulsive  therapy (MET) (preferred for stones <45m diameter), ureteroscopic stone manipulation  (URS), and shockwave lithotripsy (SWL) in detail including relative risks / benefits / and  efficacy. We discussed that all patients are candidates for MET as long as can keep  comfortable and hydrated. After consideration of options, the patient has decided to  proceed with ESWL  The patient and I talked at length about surgical treatment for their kidney stones. Etiologies of kidney stones including dehydration, poor fluid intake, intake of well water, congenital renal disease, previous bowel surgery, idiopathic, and others were discussed with the patient.   Metabolic evaluation of kidney stone disease was discussed with the patient. Alternative treatment options including increased water intake, increased lemonade intake, and dietary moderation with calcium and oxalate containing foods were discussed with the patient in detail.   The risks, benefits, and some of the possible complications of surgical treatments including cystoscopy, ureteroscopy, renoscopy, laser lithotripsy, extracorporeal shock wave  lithotripsy, stent insertion and others were discussed with the patient. All questions were answered.  The patient gave fully informed consent to proceed with an extracorporeal shock wave lithotripsy for the treatment of their kidney stones.  The patient was given instructions to call for abdominal pain, pelvic pain, perirectal pain, nausea, vomiting, diarrhea, fever over 100 degrees F, chills, hematuria, dysuria, frequency, urgency, or urge incontinence.         Notes:   Schedule for L ESWL

## 2016-04-17 NOTE — Discharge Instructions (Signed)
Procedure rescheduled for 04/20/16. Arrive to [facility] at [facility] and check in at admitting. No food or drink past midnight. May take Buspar, Prozac and Percocet if needed, only with a sip of water. No aspirin or ibuprofen products until further instructed. Follow bowel prep instructions from [facility]. Bring blue folder on Thursday.    Lithotripsy, Care After Refer to this sheet in the next few weeks. These instructions provide you with information on caring for yourself after your procedure. Your health care provider may also give you more specific instructions. Your treatment has been planned according to current medical practices, but problems sometimes occur. Call your health care provider if you have any problems or questions after your procedure. WHAT TO EXPECT AFTER THE PROCEDURE   Your urine may have a red tinge for a few days after treatment. Blood loss is usually minimal.  You may have soreness in the back or flank area. This usually goes away after a few days. The procedure can cause blotches or bruises on the back where the pressure wave enters the skin. These marks usually cause only minimal discomfort and should disappear in a short time.  Stone fragments should begin to pass within 24 hours of treatment. However, a delayed passage is not unusual.  You may have pain, discomfort, and feel sick to your stomach (nauseated) when the crushed fragments of stone are passed down the tube from the kidney to the bladder. Stone fragments can pass soon after the procedure and may last for up to 4-8 weeks.  A small number of patients may have severe pain when stone fragments are not able to pass, which leads to an obstruction.  If your stone is greater than 1 inch (2.5 cm) in diameter or if you have multiple stones that have a combined diameter greater than 1 inch (2.5 cm), you may require more than one treatment.  If you had a stent placed prior to your procedure, you may experience some  discomfort, especially during urination. You may experience the pain or discomfort in your flank or back, or you may experience a sharp pain or discomfort at the base of your penis or in your lower abdomen. The discomfort usually lasts only a few minutes after urinating. HOME CARE INSTRUCTIONS   Rest at home until you feel your energy improving.  Only take over-the-counter or prescription medicines for pain, discomfort, or fever as directed by your health care provider. Depending on the type of lithotripsy, you may need to take antibiotics and anti-inflammatory medicines for a few days.  Drink enough water and fluids to keep your urine clear or pale yellow. This helps "flush" your kidneys. It helps pass any remaining pieces of stone and prevents stones from coming back.  Most people can resume daily activities within 1-2 days after standard lithotripsy. It can take longer to recover from laser and percutaneous lithotripsy.  Strain all urine through the provided strainer. Keep all particulate matter and stones for your health care provider to see. The stone may be as small as a grain of salt. It is very important to use the strainer each and every time you pass your urine. Any stones that are found can be sent to a medical lab for examination.  Visit your health care provider for a follow-up appointment in a few weeks. Your doctor may remove your stent if you have one. Your health care provider will also check to see whether stone particles still remain. SEEK MEDICAL CARE IF:  Your pain is not relieved by medicine.  You have a lasting nauseous feeling.  You feel there is too much blood in the urine.  You develop persistent problems with frequent or painful urination that does not at least partially improve after 2 days following the procedure.  You have a congested cough.  You feel lightheaded.  You develop a rash or any other signs that might suggest an allergic problem.  You develop  any reaction or side effects to your medicine(s). SEEK IMMEDIATE MEDICAL CARE IF:   You experience severe back or flank pain or both.  You see nothing but blood when you urinate.  You cannot pass any urine at all.  You have a fever or shaking chills.  You develop shortness of breath, difficulty breathing, or chest pain.  You develop vomiting that will not stop after 6-8 hours.  You have a fainting episode.   This information is not intended to replace advice given to you by your health care provider. Make sure you discuss any questions you have with your health care provider.   Document Released: 11/12/2007 Document Revised: 07/14/2015 Document Reviewed: 05/08/2013 Elsevier Interactive Patient Education 2016 [employer]  Moderate Conscious Sedation, Adult, Care After Refer to this sheet in the next few weeks. These instructions provide you with information on caring for yourself after your procedure. Your health care provider may also give you more specific instructions. Your treatment has been planned according to current medical practices, but problems sometimes occur. Call your health care provider if you have any problems or questions after your procedure. WHAT TO EXPECT AFTER THE PROCEDURE  After your procedure:  You may feel sleepy, clumsy, and have poor balance for several hours.  Vomiting may occur if you eat too soon after the procedure. HOME CARE INSTRUCTIONS  Do not participate in any activities where you could become injured for at least 24 hours. Do not:  Drive.  Swim.  Ride a bicycle.  Operate heavy machinery.  Cook.  Use power tools.  Climb ladders.  Work from a high place.  Do not make important decisions or sign legal documents until you are improved.  If you vomit, drink water, juice, or soup when you can drink without vomiting. Make sure you have little or no nausea before eating solid foods.  Only take over-the-counter or prescription  medicines for pain, discomfort, or fever as directed by your health care provider.  Make sure you and your family fully understand everything about the medicines given to you, including what side effects may occur.  You should not drink alcohol, take sleeping pills, or take medicines that cause drowsiness for at least 24 hours.  If you smoke, do not smoke without supervision.  If you are feeling better, you may resume normal activities 24 hours after you were sedated.  Keep all appointments with your health care provider. SEEK MEDICAL CARE IF:  Your skin is pale or bluish in color.  You continue to feel nauseous or vomit.  Your pain is getting worse and is not helped by medicine.  You have bleeding or swelling.  You are still sleepy or feeling clumsy after 24 hours. SEEK IMMEDIATE MEDICAL CARE IF:  You develop a rash.  You have difficulty breathing.  You develop any type of allergic problem.  You have a fever. MAKE SURE YOU:  Understand these instructions.  Will watch your condition.  Will get help right away if you are not doing well or get worse.  This information is not intended to replace advice given to you by your health care provider. Make sure you discuss any questions you have with your health care provider.   Document Released: 08/13/2013 Document Revised: 11/13/2014 Document Reviewed: 08/13/2013 Elsevier Interactive Patient Education [employer].

## 2016-04-20 ENCOUNTER — Ambulatory Visit (HOSPITAL_COMMUNITY): Payer: Medicare HMO

## 2016-04-20 ENCOUNTER — Ambulatory Visit (HOSPITAL_COMMUNITY)
Admission: RE | Admit: 2016-04-20 | Discharge: 2016-04-20 | Disposition: A | Discharge status: Home / Self Care | Payer: Medicare HMO | Source: Ambulatory Visit | Attending: Urology | Admitting: Urology

## 2016-04-20 ENCOUNTER — Encounter (HOSPITAL_COMMUNITY): Payer: Self-pay | Admitting: *Deleted

## 2016-04-20 ENCOUNTER — Encounter (HOSPITAL_COMMUNITY)
Admission: RE | Disposition: A | Discharge status: Home / Self Care | Payer: Self-pay | Source: Ambulatory Visit | Attending: Urology

## 2016-04-20 DIAGNOSIS — E669 Obesity, unspecified: Secondary | ICD-10-CM | POA: Insufficient documentation

## 2016-04-20 DIAGNOSIS — N201 Calculus of ureter: Secondary | ICD-10-CM | POA: Insufficient documentation

## 2016-04-20 DIAGNOSIS — Z79891 Long term (current) use of opiate analgesic: Secondary | ICD-10-CM | POA: Insufficient documentation

## 2016-04-20 DIAGNOSIS — F419 Anxiety disorder, unspecified: Secondary | ICD-10-CM | POA: Diagnosis not present

## 2016-04-20 DIAGNOSIS — F329 Major depressive disorder, single episode, unspecified: Secondary | ICD-10-CM | POA: Insufficient documentation

## 2016-04-20 DIAGNOSIS — N200 Calculus of kidney: Secondary | ICD-10-CM | POA: Diagnosis present

## 2016-04-20 DIAGNOSIS — Z6830 Body mass index (BMI) 30.0-30.9, adult: Secondary | ICD-10-CM | POA: Diagnosis not present

## 2016-04-20 DIAGNOSIS — Z79899 Other long term (current) drug therapy: Secondary | ICD-10-CM | POA: Insufficient documentation

## 2016-04-20 HISTORY — DX: Nausea with vomiting, unspecified: Z98.890

## 2016-04-20 HISTORY — DX: Nausea with vomiting, unspecified: R11.2

## 2016-04-20 SURGERY — LITHOTRIPSY, ESWL
Anesthesia: LOCAL | Laterality: Left

## 2016-04-20 MED ORDER — SODIUM CHLORIDE 0.9 % IV SOLN
INTRAVENOUS | Status: DC
  Administered 2016-04-20: 08:00:00 via INTRAVENOUS

## 2016-04-20 MED ORDER — DIPHENHYDRAMINE HCL 25 MG PO CAPS
25.0000 mg | ORAL_CAPSULE | ORAL | Status: AC
  Administered 2016-04-20: 25 mg via ORAL
  Filled 2016-04-20: qty 1

## 2016-04-20 MED ORDER — DIAZEPAM 5 MG PO TABS
10.0000 mg | ORAL_TABLET | ORAL | Status: AC
  Administered 2016-04-20: 10 mg via ORAL
  Filled 2016-04-20: qty 2

## 2016-04-20 MED ORDER — CIPROFLOXACIN HCL 500 MG PO TABS
500.0000 mg | ORAL_TABLET | ORAL | Status: AC
  Administered 2016-04-20: 500 mg via ORAL
  Filled 2016-04-20: qty 1

## 2016-04-20 NOTE — H&P (Signed)
Office Visit Report     04/14/2016   --------------------------------------------------------------------------------   Rebecca Andrade  MRN: [id number]  PRIMARY CARE:  Harlan Stains, MD  DOB: 06/15/1971, 45 year old Female  REFERRING:  Harlan Stains, MD  [phone]  PROVIDER:  Kathie Rhodes, M.D.    TREATING:  Nicolette Bang, M.D.    LOCATION:  [facility] [phone]   --------------------------------------------------------------------------------   CC: I have kidney stones.  HPI: Rebecca Andrade is a 45 year-old female patient who was referred by Dr. Harlan Stains, MD who is here for renal calculi.  The problem is on the left side. She first stated noticing pain on approximately 04/11/2016. She is currently having flank pain, back pain, nausea, and vomiting. She denies having groin pain, fever, and chills. She has not caught a stone in her urine strainer since her symptoms began.   She has had eswl, ureteral stent, and ureteroscopy for treatment of her stones in the past.   04/14/2016: 1cm left renal pelvis calculus. multiple bilateral nonobstructing renal calculi.  She previously had ESWL by Dr. Janice Norrie     CC: I have pain in the flank.  HPI: The problem is on the left side. Her pain started about approximately 04/11/2016. The intensity of her pain is rated as a 9. The pain is intermittent. The pain does not radiate.   None< makes the pain better. Nothing causes the pain to become worse. She was treated with the following pain medication(s): Toradol and Percocet.   She has had this same pain previously. She has had kidney stones.     ALLERGIES: No Known Drug Allergies    MEDICATIONS: Percocet 1 PO Daily  Adderall TABS Oral  Bactrim 1 PO Daily  Buspar 1 PO Daily  Prozac 1 PO Daily  Seroquel 1 PO Daily     GU PSH: Cysto Uretero Lithotripsy - 2010, 2009 Cystoscopy Insert Stent - 2013, 2012, 2010, 2009 Cystoscopy Ureteroscopy - 2012 Renal ESWL - 2010      [place]  Notes: Cystoscopy With Insertion Of Ureteral Stent Left, Cystoscopy With Ureteroscopy Left, Cystoscopy With Insertion Of Ureteral Stent Left, Cystoscopy With Ureteroscopy With Lithotripsy, Cystoscopy With Insertion Of Ureteral Stent Left, Lithotripsy, Cystoscopy With Insertion Of Ureteral Stent Left, Cystoscopy With Ureteroscopy With Lithotripsy, Brain Surgery   NON-GU PSH: None   GU PMH: Calculus Ureter, Calculus of left ureter - 2014, Calculus of ureter, - 2014 Hydronephrosis Unspec, Hydronephrosis - 2014 Kidney Stone, Kidney stone on right side - 2014, Nephrolithiasis, - 2014, Kidney stone on left side, - 2014 Low back pain, Lower back pain - 2014 Urinary Tract Inf, Unspec site, Urinary tract infection - 2014, Pyuria, - 2014    NON-GU PMH: Anxiety disorder, unspecified, Anxiety - 2014 Personal history of other mental and behavioral disorders, History of depression - 2014 Personal history of other specified conditions, History of heartburn - 2014 Encounter for general adult medical examination without abnormal findings, Encounter for preventive health examination    FAMILY HISTORY: Family Health Status - Father's Age - Runs In Family Family Health Status - Mother's Age - Runs In Family Family Health Status Number Of Children - Runs In Family Stroke Syndrome - Mother   SOCIAL HISTORY: Marital Status: Single Patient has never smoked.  Does not drink anymore.  Drinks 3 caffeinated drinks per day. Patient's occupation is/was disabled.     Notes: Never Smoked, Caffeine Use, Alcohol Use, Marital History - Single, Occupation:   REVIEW OF SYSTEMS:  GU Review Female:   Patient denies frequent urination, hard to postpone urination, burning /pain with urination, get up at night to urinate, leakage of urine, stream starts and stops, trouble starting your stream, have to strain to urinate, and currently pregnant.  Gastrointestinal (Upper):   Patient reports nausea and vomiting.    Gastrointestinal (Lower):   Patient reports constipation.   Constitutional:   Patient reports fatigue. Patient denies fever, night sweats, and weight loss.  Skin:   Patient denies skin rash/ lesion and itching.  Eyes:   Patient denies double vision and blurred vision.  Ears/ Nose/ Throat:   Patient denies sore throat and sinus problems.  Hematologic/Lymphatic:   Patient denies swollen glands and easy bruising.  Cardiovascular:   Patient denies leg swelling and chest pains.  Respiratory:   Patient denies cough and shortness of breath.  Endocrine:   Patient reports excessive thirst.   Musculoskeletal:   Patient reports back pain and joint pain.   Neurological:   Patient reports headaches and dizziness.   Psychologic:   Patient reports depression and anxiety.    VITAL SIGNS:    Weight: 200 lb/90.7 kg   Height/Length: 67 in / 170 cm   BP: 110/77 mmHg   Pulse: 80 /min   Temp: 98.1 F / 37 C   BMI: 31.3      MULTI-SYSTEM PHYSICAL EXAMINATION:    Constitutional: Well-nourished. No physical deformities. Normally developed. Good grooming.  Neck: Neck symmetrical, not swollen. Normal tracheal position.  Respiratory: No labored breathing, no use of accessory muscles.   Cardiovascular: Normal temperature, normal extremity pulses, no swelling, no varicosities.  Lymphatic: No enlargement of neck, axillae, groin.  Skin: No paleness, no jaundice, no cyanosis. No lesion, no ulcer, no rash.  Neurologic / Psychiatric: Oriented to time, oriented to place, oriented to person. No depression, no anxiety, no agitation.  Gastrointestinal: No mass, no tenderness, no rigidity, non obese abdomen.  Eyes: Normal conjunctivae. Normal eyelids.  Ears, Nose, Mouth, and Throat: Left ear no scars, no lesions, no masses. Right ear no scars, no lesions, no masses. Nose no scars, no lesions, no masses. Normal hearing. Normal lips.  Musculoskeletal: Normal gait and station of head and neck.   PAST DATA REVIEWED:   Source Of History:  Patient  X-Ray Review: C.T. Chest/ABD: Reviewed Films. Reviewed Report. Discussed With Patient.     PROCEDURES:         KUB - 74000  A single view of the abdomen is obtained.               Urinalysis w/Scope - 81001 Dipstick Dipstick Cont'd Micro  Specimen: Voided Blood: Trace Intact WBC/hpf: 0-5/hpf  Appearance: Clear Nitrites: Neg RBC/hpf: 0-2  Color: Yellow Leukocyte Esterase: Trace Bacteria: few (10-25/hpf)  Ketones: 1+             Ketoralac 33m - J1885, [id number] injection with no difficulty.   Qty: 60 Adm. By: AGavin Potters Unit: mg Lot No [id number]  Route: IM Exp. Date 03/06/2017  Freq: None Mfgr.:   Site: Left Buttock         Phenergan 279m- [id number], [id number]: 25 Adm. By: AmGavin PottersUnit: mg Lot No [id number]: IM Exp. Date 04/06/2016  Freq: None Mfgr.:   Site: Right Buttock   ASSESSMENT:      ICD-10 Details  1 GU:   Kidney Stone - N20.0   2   Flank Pain - R10.84  PLAN:            Medications New Meds: Ketorolac Tromethamine 60 mg/2 ml vial 60 mg IM once   #60  0 Refill(s)  Phenergan 25 mg/ml vial 1 ml IM once   #25  0 Refill(s)            Orders X-Rays: KUB          Schedule         Document Letter(s):  Created for Patient: Clinical Summary    We discussed management strategies of ureteral stones including medical expulsive  therapy (MET) (preferred for stones <64m diameter), ureteroscopic stone manipulation  (URS), and shockwave lithotripsy (SWL) in detail including relative risks / benefits / and  efficacy. We discussed that all patients are candidates for MET as long as can keep  comfortable and hydrated. After consideration of options, the patient has decided to  proceed with ESWL  The patient and I talked at length about surgical treatment for their kidney stones. Etiologies of kidney stones including dehydration, poor fluid intake, intake of well water, congenital renal disease, previous bowel  surgery, idiopathic, and others were discussed with the patient.   Metabolic evaluation of kidney stone disease was discussed with the patient. Alternative treatment options including increased water intake, increased lemonade intake, and dietary moderation with calcium and oxalate containing foods were discussed with the patient in detail.   The risks, benefits, and some of the possible complications of surgical treatments including cystoscopy, ureteroscopy, renoscopy, laser lithotripsy, extracorporeal shock wave lithotripsy, stent insertion and others were discussed with the patient. All questions were answered.  The patient gave fully informed consent to proceed with an extracorporeal shock wave lithotripsy for the treatment of their kidney stones.  The patient was given instructions to call for abdominal pain, pelvic pain, perirectal pain, nausea, vomiting, diarrhea, fever over 100 degrees F, chills, hematuria, dysuria, frequency, urgency, or urge incontinence.         Notes:   Schedule for L ESWL    Signed by PNicolette Bang M.D. on 04/14/16 at 4:42 PM (EDT)     The information contained in this medical record document is considered private and confidential patient information. This information can only be used for the medical diagnosis and/or medical services that are being provided by the patient's selected caregivers. This information can only be distributed outside of the patient's care if the patient agrees and signs waivers of authorization for this information to be sent to an outside source or route.

## 2016-04-20 NOTE — Discharge Instructions (Signed)
Dietary Guidelines to Help Prevent Kidney Stones Your risk of kidney stones can be decreased by adjusting the foods you eat. The most important thing you can do is drink enough fluid. You should drink enough fluid to keep your urine clear or pale yellow. The following guidelines provide specific information for the type of kidney stone you have had. GUIDELINES ACCORDING TO TYPE OF KIDNEY STONE Calcium Oxalate Kidney Stones  Reduce the amount of salt you eat. Foods that have a lot of salt cause your body to release excess calcium into your urine. The excess calcium can combine with a substance called oxalate to form kidney stones.  Reduce the amount of animal protein you eat if the amount you eat is excessive. Animal protein causes your body to release excess calcium into your urine. Ask your dietitian how much protein from animal sources you should be eating.  Avoid foods that are high in oxalates. If you take vitamins, they should have less than 500 mg of vitamin C. Your body turns vitamin C into oxalates. You do not need to avoid fruits and vegetables high in vitamin C. Calcium Phosphate Kidney Stones  Reduce the amount of salt you eat to help prevent the release of excess calcium into your urine.  Reduce the amount of animal protein you eat if the amount you eat is excessive. Animal protein causes your body to release excess calcium into your urine. Ask your dietitian how much protein from animal sources you should be eating.  Get enough calcium from food or take a calcium supplement (ask your dietitian for recommendations). Food sources of calcium that do not increase your risk of kidney stones include:  Broccoli.  Dairy products, such as cheese and yogurt.  Pudding. Uric Acid Kidney Stones  Do not have more than 6 oz of animal protein per day. FOOD SOURCES Animal Protein Sources  Meat (all types).  Poultry.  Eggs.  Fish, seafood. Foods High in Salt  Salt seasonings.  Soy  sauce.  Teriyaki sauce.  Cured and processed meats.  Salted crackers and snack foods.  Fast food.  Canned soups and most canned foods. Foods High in Oxalates  Grains:  Amaranth.  Barley.  Grits.  Wheat germ.  Bran.  Buckwheat flour.  All bran cereals.  Pretzels.  Whole wheat bread.  Vegetables:  Beans (wax).  Beets and beet greens.  Collard greens.  Eggplant.  Escarole.  Leeks.  Okra.  Parsley.  Rutabagas.  Spinach.  Swiss chard.  Tomato paste.  Fried potatoes.  Sweet potatoes.  Fruits:  Red currants.  Figs.  Kiwi.  Rhubarb.  Meat and Other Protein Sources:  Beans (dried).  Soy burgers and other soybean products.  Miso.  Nuts (peanuts, almonds, pecans, cashews, hazelnuts).  Nut butters.  Sesame seeds and tahini (paste made of sesame seeds).  Poppy seeds.  Beverages:  Chocolate drink mixes.  Soy milk.  Instant iced tea.  Juices made from high-oxalate fruits or vegetables.  Other:  Carob.  Chocolate.  Fruitcake.  Marmalades.   This information is not intended to replace advice given to you by your health care provider. Make sure you discuss any questions you have with your health care provider.   Document Released: 02/17/2011 Document Revised: 10/28/2013 Document Reviewed: 09/19/2013 Elsevier Interactive Patient Education 2016 Elsevier Inc.  

## 2016-04-20 NOTE — Interval H&P Note (Signed)
History and Physical Interval Note:  04/20/2016 8:20 AM  Rebecca Andrade  has presented today for surgery, with the diagnosis of LEFT URETERAL STONE   The various methods of treatment have been discussed with the patient and family. After consideration of risks, benefits and other options for treatment, the patient has consented to  Procedure(s): LEFT EXTRACORPOREAL SHOCK WAVE LITHOTRIPSY (ESWL) (Left) as a surgical intervention .  The patient's history has been reviewed, patient examined, no change in status, stable for surgery.  I have reviewed the patient's chart and labs.  Questions were answered to the patient's satisfaction.     Georgine Wiltse I Jodiann Ognibene

## 2016-04-24 ENCOUNTER — Ambulatory Visit: Payer: Self-pay | Admitting: Neurology

## 2016-05-17 ENCOUNTER — Emergency Department (HOSPITAL_COMMUNITY)
Admission: EM | Admit: 2016-05-17 | Discharge: 2016-05-17 | Disposition: A | Discharge status: Home / Self Care | Payer: Medicare HMO | Attending: Dermatology | Admitting: Dermatology

## 2016-05-17 ENCOUNTER — Encounter (HOSPITAL_COMMUNITY): Payer: Self-pay | Admitting: *Deleted

## 2016-05-17 ENCOUNTER — Ambulatory Visit (HOSPITAL_COMMUNITY): Payer: Self-pay

## 2016-05-17 DIAGNOSIS — R109 Unspecified abdominal pain: Secondary | ICD-10-CM | POA: Insufficient documentation

## 2016-05-17 DIAGNOSIS — Z5321 Procedure and treatment not carried out due to patient leaving prior to being seen by health care provider: Secondary | ICD-10-CM | POA: Insufficient documentation

## 2016-05-17 DIAGNOSIS — N189 Chronic kidney disease, unspecified: Secondary | ICD-10-CM | POA: Insufficient documentation

## 2016-05-17 LAB — COMPREHENSIVE METABOLIC PANEL
ALBUMIN: 3.7 g/dL (ref 3.5–5.0)
ALT: 15 U/L (ref 14–54)
ANION GAP: 7 (ref 5–15)
AST: 17 U/L (ref 15–41)
Alkaline Phosphatase: 70 U/L (ref 38–126)
BILIRUBIN TOTAL: 0.6 mg/dL (ref 0.3–1.2)
BUN: 20 mg/dL (ref 6–20)
CHLORIDE: 106 mmol/L (ref 101–111)
CO2: 23 mmol/L (ref 22–32)
Calcium: 9 mg/dL (ref 8.9–10.3)
Creatinine, Ser: 1.05 mg/dL — ABNORMAL HIGH (ref 0.44–1.00)
GFR calc Af Amer: >60 mL/min (ref >60)
GFR calc non Af Amer: >60 mL/min (ref >60)
GLUCOSE: 93 mg/dL (ref 65–99)
POTASSIUM: 3.7 mmol/L (ref 3.5–5.1)
Sodium: 136 mmol/L (ref 135–145)
TOTAL PROTEIN: 6.9 g/dL (ref 6.5–8.1)

## 2016-05-17 LAB — CBC
HEMATOCRIT: 47.3 % — AB (ref 36.0–46.0)
HEMOGLOBIN: 15.5 g/dL — AB (ref 12.0–15.0)
MCH: 29.8 pg (ref 26.0–34.0)
MCHC: 32.8 g/dL (ref 30.0–36.0)
MCV: 90.8 fL (ref 78.0–100.0)
Platelets: 284 10*3/uL (ref 150–400)
RBC: 5.21 MIL/uL — ABNORMAL HIGH (ref 3.87–5.11)
RDW: 12.5 % (ref 11.5–15.5)
WBC: 14.9 10*3/uL — AB (ref 4.0–10.5)

## 2016-05-17 LAB — URINALYSIS, ROUTINE W REFLEX MICROSCOPIC
BILIRUBIN URINE: NEGATIVE
GLUCOSE, UA: NEGATIVE mg/dL
Hgb urine dipstick: NEGATIVE
KETONES UR: NEGATIVE mg/dL
LEUKOCYTES UA: NEGATIVE
NITRITE: NEGATIVE
PH: 6 (ref 5.0–8.0)
PROTEIN: NEGATIVE mg/dL
SPECIFIC GRAVITY, URINE: 1.024 (ref 1.005–1.030)

## 2016-05-17 LAB — LIPASE, BLOOD: LIPASE: 37 U/L (ref 11–51)

## 2016-05-17 LAB — POC URINE PREG, ED: PREG TEST UR: NEGATIVE

## 2016-05-17 MED ORDER — ONDANSETRON HCL 4 MG/2ML IJ SOLN: 4.0000 mg | Freq: Once | INTRAMUSCULAR | Status: DC

## 2016-05-17 MED ORDER — ONDANSETRON 4 MG PO TBDP
8.0000 mg | ORAL_TABLET | Freq: Once | ORAL | Status: AC
  Administered 2016-05-17: 8 mg via ORAL
  Filled 2016-05-17: qty 2

## 2016-05-17 MED ORDER — SODIUM CHLORIDE 0.9 % IV BOLUS (SEPSIS): 1000.0000 mL | Freq: Once | INTRAVENOUS | Status: DC

## 2016-05-17 MED ORDER — OXYCODONE-ACETAMINOPHEN 5-325 MG PO TABS
1.0000 | ORAL_TABLET | ORAL | Status: DC | PRN
  Administered 2016-05-17: 1 via ORAL
  Filled 2016-05-17: qty 1

## 2016-05-17 NOTE — ED Notes (Signed)
This RN walked in to room to find patient explaining to EMT that she did not want to stay.  This RN explained that we have not had the opportunity to assess, and explained the AMA form.  Pt signed and verbalized understanding.

## 2016-05-17 NOTE — ED Notes (Signed)
Pt was on the phone with her son upon [facility]ing to [employer]. She had a disagreement with him and hung up, stated she could not stay, and needed to leave. I attempted to get Pt to stay, offering assistance with a hired ride home if need be. Pt declined, and stated she needed to leave. After a subsequent attempt to get the Pt to stay and be seen, Pt stated she would still wish to leave and be seen by her Urologist in the AM. I encouraged Pt to return to ED if condition worsened.   CAC

## 2016-05-17 NOTE — ED Notes (Signed)
The pt is c/o lt flank pain for 2 days with nv.. lmp last month hx of the same

## 2016-05-23 ENCOUNTER — Ambulatory Visit: Payer: Commercial Managed Care - HMO | Admitting: Neurology

## 2016-06-02 ENCOUNTER — Ambulatory Visit: Payer: Medicare HMO | Admitting: Neurology

## 2016-06-16 ENCOUNTER — Other Ambulatory Visit (HOSPITAL_COMMUNITY): Payer: Self-pay | Admitting: Psychiatry

## 2016-06-16 DIAGNOSIS — F332 Major depressive disorder, recurrent severe without psychotic features: Secondary | ICD-10-CM

## 2016-06-19 ENCOUNTER — Telehealth (HOSPITAL_COMMUNITY): Payer: Self-pay

## 2016-06-19 DIAGNOSIS — F988 Other specified behavioral and emotional disorders with onset usually occurring in childhood and adolescence: Secondary | ICD-10-CM

## 2016-06-19 NOTE — Telephone Encounter (Signed)
Arfeen patient calling for a refill on Adderall 10 mg 2 po qam, and 1 po q noon. Patient was last seen on 6/1 and has a follow up on 9/5. Please review and advise, thank you

## 2016-06-19 NOTE — Telephone Encounter (Signed)
Not due until 8/23

## 2016-06-26 ENCOUNTER — Telehealth: Payer: Self-pay | Admitting: Neurology

## 2016-06-26 ENCOUNTER — Ambulatory Visit: Payer: Medicare HMO | Admitting: Neurology

## 2016-06-26 ENCOUNTER — Telehealth (HOSPITAL_COMMUNITY): Payer: Self-pay

## 2016-06-26 ENCOUNTER — Telehealth: Payer: Self-pay | Admitting: *Deleted

## 2016-06-26 MED ORDER — AMPHETAMINE-DEXTROAMPHETAMINE 10 MG PO TABS: ORAL_TABLET | ORAL | 0 refills | Status: DC

## 2016-06-26 NOTE — Telephone Encounter (Signed)
This patient did not show for a revisit appointment today. 

## 2016-06-26 NOTE — Telephone Encounter (Signed)
06/26/16 1:19pm Patient came and pick-up rx script [id number].[name].Marguerite Olea/sh

## 2016-06-26 NOTE — Telephone Encounter (Signed)
Spoke to patient - appt rescheduled. 

## 2016-06-26 NOTE — Telephone Encounter (Signed)
Pt's called in after missing her appt for today. She states her grandfather passed away. She said she could come in on 9/7 at 7:30 if possible. Please call and advise

## 2016-06-26 NOTE — Telephone Encounter (Signed)
Spoke with Rebecca Andrade today, patient is now due for the medication refill, he agreed. Rx was printed, signed and patient called to come and pick up

## 2016-06-26 NOTE — Telephone Encounter (Signed)
No showed follow up appt. 

## 2016-06-29 ENCOUNTER — Encounter: Payer: Self-pay | Admitting: Neurology

## 2016-07-06 ENCOUNTER — Telehealth: Payer: Self-pay | Admitting: Neurology

## 2016-07-06 NOTE — Telephone Encounter (Signed)
Returned pt TC. Work-in appt scheduled for tomorrow. Pt agreed to arrive @ 11:45.

## 2016-07-06 NOTE — Telephone Encounter (Signed)
Pt called requested to be seen sooner that 9/7. She said her symptoms are getting worse. Please call

## 2016-07-07 ENCOUNTER — Ambulatory Visit (INDEPENDENT_AMBULATORY_CARE_PROVIDER_SITE_OTHER): Payer: Medicare HMO | Admitting: Neurology

## 2016-07-07 ENCOUNTER — Encounter: Payer: Self-pay | Admitting: Neurology

## 2016-07-07 VITALS — BP 110/79 | HR 86 | Ht 67.0 in | Wt 193.0 lb

## 2016-07-07 DIAGNOSIS — R42 Dizziness and giddiness: Secondary | ICD-10-CM

## 2016-07-07 DIAGNOSIS — G43019 Migraine without aura, intractable, without status migrainosus: Secondary | ICD-10-CM | POA: Diagnosis not present

## 2016-07-07 DIAGNOSIS — H532 Diplopia: Secondary | ICD-10-CM | POA: Diagnosis not present

## 2016-07-07 MED ORDER — GABAPENTIN 300 MG PO CAPS: 300.0000 mg | ORAL_CAPSULE | Freq: Two times a day (BID) | ORAL | 3 refills | Status: DC

## 2016-07-07 NOTE — Patient Instructions (Signed)
We will check MRI of the brain, and we will start gabapentin for migraine headache.
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
## 2016-07-11 ENCOUNTER — Ambulatory Visit (HOSPITAL_COMMUNITY): Payer: Self-pay | Admitting: Psychiatry

## 2016-07-11 ENCOUNTER — Telehealth: Payer: Self-pay | Admitting: Neurology

## 2016-07-11 MED ORDER — MECLIZINE HCL 25 MG PO TABS: 25.0000 mg | ORAL_TABLET | Freq: Three times a day (TID) | ORAL | 1 refills | Status: DC | PRN

## 2016-07-11 NOTE — Telephone Encounter (Signed)
Continue gabapentin and give time for medication to start working. -VRP

## 2016-07-11 NOTE — Telephone Encounter (Signed)
Patient called to advise, she hasn't heard from anyone regarding scheduling MRI, advised of 7-10 day turn around, patient states she can't wait 7-10 days, also states Dr. Anne HahnWillis didn't write Rx for vertigo, states she paid $45 copay, "I feel like I gave that money for nothing".

## 2016-07-11 NOTE — Telephone Encounter (Signed)
Pt w/ hx of migraines was seen 07/07/16 by Dr. Anne HahnWillis for vertigo and headache. MRI was ordered w/ scheduling currently in progress. She was also started on gabapentin 300 mg twice a day for headache. Topamax was d/c'd due to recent kidney stones.

## 2016-07-11 NOTE — Telephone Encounter (Signed)
I called the patient. The patient is having ongoing daily episodes of vertigo, some episodes are associated with headache, some are not. The patient may have diaphoresis, visual loss with the episodes. The episodes are not particularly related to any head position.  I will call in a prescription for Antivert. The patient will continue the gabapentin.

## 2016-07-11 NOTE — Telephone Encounter (Signed)
Talked to pt. Let her know that she will need to give gabapentin more time before she may notice any improvement. Says that so far medication is not working at all for vertigo. She believes that the dizziness is unrelated to migraines as she "doesn't have a headache every time she has the dizziness." She's aware that Dr. Anne HahnWillis is out of the office today but would like further recommendations for relief of vertigo. Also advised that MRI coordinator would call her to schedule scan after approved by her insurance company. She verbalized understanding and appreciation for call.

## 2016-07-13 ENCOUNTER — Ambulatory Visit: Payer: Self-pay | Admitting: Neurology

## 2016-07-21 ENCOUNTER — Telehealth (HOSPITAL_COMMUNITY): Payer: Self-pay

## 2016-07-21 NOTE — Telephone Encounter (Signed)
Patient called for a refill on her Adderall, refill is appropriate and patient is scheduled for a follow up next month. Please review and advise, thank you

## 2016-07-24 ENCOUNTER — Other Ambulatory Visit (HOSPITAL_COMMUNITY): Payer: Self-pay

## 2016-07-24 DIAGNOSIS — F988 Other specified behavioral and emotional disorders with onset usually occurring in childhood and adolescence: Secondary | ICD-10-CM

## 2016-07-24 MED ORDER — AMPHETAMINE-DEXTROAMPHETAMINE 10 MG PO TABS: ORAL_TABLET | ORAL | 0 refills | Status: DC

## 2016-07-25 ENCOUNTER — Telehealth (HOSPITAL_COMMUNITY): Payer: Self-pay

## 2016-07-25 NOTE — Telephone Encounter (Signed)
07/25/16 10:53AM Patient came and pick-up rx script [id number].[name].Marguerite Olea/sh

## 2016-08-21 ENCOUNTER — Other Ambulatory Visit (HOSPITAL_COMMUNITY): Payer: Self-pay | Admitting: Psychiatry

## 2016-08-21 ENCOUNTER — Other Ambulatory Visit (HOSPITAL_COMMUNITY): Payer: Self-pay

## 2016-08-21 DIAGNOSIS — F332 Major depressive disorder, recurrent severe without psychotic features: Secondary | ICD-10-CM

## 2016-08-21 MED ORDER — FLUOXETINE HCL 40 MG PO CAPS: 40.0000 mg | ORAL_CAPSULE | Freq: Every day | ORAL | 0 refills | Status: DC

## 2016-08-21 MED ORDER — QUETIAPINE FUMARATE 100 MG PO TABS: 100.0000 mg | ORAL_TABLET | Freq: Every day | ORAL | 0 refills | Status: DC

## 2016-08-21 MED ORDER — BUSPIRONE HCL 10 MG PO TABS: 10.0000 mg | ORAL_TABLET | Freq: Two times a day (BID) | ORAL | 0 refills | Status: DC

## 2016-08-21 NOTE — Progress Notes (Unsigned)
Patient was rescheduled to next Wednesday, she is due for a refill on all her medications. I sent a one month supply of Seroquel, Buspar, and Prozac to the pharmacy. Patient will also need her Adderall, is this okay to print? Please review and advise, thank you

## 2016-08-22 ENCOUNTER — Other Ambulatory Visit: Payer: Self-pay | Admitting: Nurse Practitioner

## 2016-08-22 DIAGNOSIS — Z1231 Encounter for screening mammogram for malignant neoplasm of breast: Secondary | ICD-10-CM

## 2016-08-23 ENCOUNTER — Ambulatory Visit (HOSPITAL_COMMUNITY): Payer: Self-pay | Admitting: Psychiatry

## 2016-08-23 NOTE — Telephone Encounter (Signed)
Patient is calling for a refill on Adderall, she has a follow up with you on 10/25, however that will probably be rescheduled as it is on a wed. Please review and advise if it is okay to give refill on Adderall, thank you

## 2016-08-24 ENCOUNTER — Encounter (HOSPITAL_COMMUNITY): Payer: Self-pay | Admitting: Psychiatry

## 2016-08-24 ENCOUNTER — Ambulatory Visit (INDEPENDENT_AMBULATORY_CARE_PROVIDER_SITE_OTHER): Payer: Medicare HMO | Admitting: Psychiatry

## 2016-08-24 DIAGNOSIS — Z79899 Other long term (current) drug therapy: Secondary | ICD-10-CM

## 2016-08-24 DIAGNOSIS — F332 Major depressive disorder, recurrent severe without psychotic features: Secondary | ICD-10-CM

## 2016-08-24 DIAGNOSIS — F900 Attention-deficit hyperactivity disorder, predominantly inattentive type: Secondary | ICD-10-CM | POA: Diagnosis not present

## 2016-08-24 MED ORDER — AMPHETAMINE-DEXTROAMPHETAMINE 10 MG PO TABS
ORAL_TABLET | ORAL | 0 refills | Status: DC
Start: 2016-08-24 — End: 2016-08-24

## 2016-08-24 MED ORDER — QUETIAPINE FUMARATE 100 MG PO TABS: 100.0000 mg | ORAL_TABLET | Freq: Every day | ORAL | 1 refills | Status: DC

## 2016-08-24 MED ORDER — BUSPIRONE HCL 10 MG PO TABS: 10.0000 mg | ORAL_TABLET | Freq: Two times a day (BID) | ORAL | 1 refills | Status: DC

## 2016-08-24 MED ORDER — FLUOXETINE HCL 40 MG PO CAPS: 40.0000 mg | ORAL_CAPSULE | Freq: Every day | ORAL | 1 refills | Status: DC

## 2016-08-24 MED ORDER — AMPHETAMINE-DEXTROAMPHETAMINE 10 MG PO TABS: ORAL_TABLET | ORAL | 0 refills | Status: DC

## 2016-08-24 NOTE — Telephone Encounter (Signed)
Patient is coming in today.
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
## 2016-08-30 ENCOUNTER — Ambulatory Visit (HOSPITAL_COMMUNITY): Payer: Self-pay | Admitting: Psychiatry

## 2016-09-07 ENCOUNTER — Other Ambulatory Visit: Payer: Self-pay | Admitting: Neurology

## 2016-09-11 ENCOUNTER — Inpatient Hospital Stay: Admission: RE | Admit: 2016-09-11 | Payer: Self-pay | Source: Ambulatory Visit

## 2016-09-18 ENCOUNTER — Ambulatory Visit: Payer: Self-pay

## 2016-09-19 ENCOUNTER — Ambulatory Visit (INDEPENDENT_AMBULATORY_CARE_PROVIDER_SITE_OTHER): Payer: Medicare HMO | Admitting: Clinical

## 2016-09-19 DIAGNOSIS — F332 Major depressive disorder, recurrent severe without psychotic features: Secondary | ICD-10-CM

## 2016-09-19 DIAGNOSIS — F988 Other specified behavioral and emotional disorders with onset usually occurring in childhood and adolescence: Secondary | ICD-10-CM

## 2016-09-19 DIAGNOSIS — F411 Generalized anxiety disorder: Secondary | ICD-10-CM | POA: Diagnosis not present

## 2016-09-21 ENCOUNTER — Encounter (HOSPITAL_COMMUNITY): Payer: Self-pay | Admitting: Clinical
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
## 2016-10-03 ENCOUNTER — Ambulatory Visit
Admission: RE | Admit: 2016-10-03 | Discharge: 2016-10-03 | Disposition: A | Discharge status: Home / Self Care | Payer: Medicare HMO | Source: Ambulatory Visit | Attending: Nurse Practitioner | Admitting: Nurse Practitioner

## 2016-10-03 DIAGNOSIS — Z1231 Encounter for screening mammogram for malignant neoplasm of breast: Secondary | ICD-10-CM

## 2016-10-05 ENCOUNTER — Inpatient Hospital Stay: Admission: RE | Admit: 2016-10-05 | Payer: Self-pay | Source: Ambulatory Visit

## 2016-10-09 ENCOUNTER — Emergency Department (HOSPITAL_COMMUNITY)
Admission: EM | Admit: 2016-10-09 | Discharge: 2016-10-09 | Disposition: A | Discharge status: Home / Self Care | Payer: Medicare HMO | Attending: Emergency Medicine | Admitting: Emergency Medicine

## 2016-10-09 ENCOUNTER — Encounter (HOSPITAL_COMMUNITY): Payer: Self-pay | Admitting: Emergency Medicine

## 2016-10-09 ENCOUNTER — Emergency Department (HOSPITAL_COMMUNITY): Payer: Medicare HMO

## 2016-10-09 DIAGNOSIS — N189 Chronic kidney disease, unspecified: Secondary | ICD-10-CM | POA: Diagnosis not present

## 2016-10-09 DIAGNOSIS — F909 Attention-deficit hyperactivity disorder, unspecified type: Secondary | ICD-10-CM | POA: Insufficient documentation

## 2016-10-09 DIAGNOSIS — R05 Cough: Secondary | ICD-10-CM | POA: Diagnosis not present

## 2016-10-09 DIAGNOSIS — R059 Cough, unspecified: Secondary | ICD-10-CM

## 2016-10-09 MED ORDER — PREDNISONE 20 MG PO TABS: 40.0000 mg | ORAL_TABLET | Freq: Every day | ORAL | 0 refills | Status: DC

## 2016-10-09 MED ORDER — SALINE SPRAY 0.65 % NA SOLN: 1.0000 | NASAL | 0 refills | Status: DC | PRN

## 2016-10-09 MED ORDER — LIDOCAINE VISCOUS 2 % MT SOLN
15.0000 mL | Freq: Once | OROMUCOSAL | Status: AC
  Administered 2016-10-09: 15 mL via OROMUCOSAL
  Filled 2016-10-09: qty 15

## 2016-10-09 MED ORDER — LORATADINE 10 MG PO TABS: 10.0000 mg | ORAL_TABLET | Freq: Every day | ORAL | 0 refills | Status: DC

## 2016-10-09 MED ORDER — LIDOCAINE VISCOUS 2 % MT SOLN: 15.0000 mL | Freq: Four times a day (QID) | OROMUCOSAL | 0 refills | Status: DC | PRN

## 2016-10-09 MED ORDER — ALBUTEROL SULFATE HFA 108 (90 BASE) MCG/ACT IN AERS
2.0000 | INHALATION_SPRAY | RESPIRATORY_TRACT | Status: DC | PRN
  Administered 2016-10-09: 2 via RESPIRATORY_TRACT
  Filled 2016-10-09: qty 6.7

## 2016-10-09 NOTE — ED Notes (Signed)
Patient from X-ray
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
## 2016-10-09 NOTE — ED Triage Notes (Signed)
Pt c/o cough and cold symptoms x 3 weeks. Pt reports that she got the flu shot 4 weeks ago. Pt seen by PMD for same and given, "a clear pill for the cough and it is not helping." Pt reports also took robitusiin, delsym and nyquil without relief. Pt also c/o sore throat and nasal congestion.

## 2016-10-09 NOTE — ED Notes (Signed)
Patient transported to X-ray 

## 2016-10-09 NOTE — Discharge Instructions (Signed)
You were seen today for cough. Cough symptoms can linger following a viral illness for 4-6 weeks. This may be related to a virus or allergies. You will be started on an allergy medication. You'll be given a steroid for bronchial inflammation. I encourage you to use an inhaler and local honey for cough symptoms. Follow-up with your primary physician if symptoms persist or worsen.

## 2016-10-10 ENCOUNTER — Ambulatory Visit (HOSPITAL_COMMUNITY): Payer: Self-pay | Admitting: Clinical

## 2016-10-10 ENCOUNTER — Telehealth: Payer: Self-pay | Admitting: *Deleted

## 2016-10-10 NOTE — Telephone Encounter (Signed)
11:47: pt had been seen on 10/09/16 and diagnosed with cold symptoms with sore throat. Viscous lidocaine had been ordered per the ED note but the Rx read lidocaine 2% solution. Meade MawKatia, Pharmacy Tech called requesting this be changed to 4% as the 2% was unavailable. CM asked to speak with Pharmacist to find a solution to this problem to present to Pod E MD who was  Unavailable. Spoke with Pod C Pharmacy Techs who clarified the Viscous Lidocaine Order. Recalled Pharmacist Camillia who will order for the pt. No further CM needs at present.

## 2016-10-12 ENCOUNTER — Ambulatory Visit: Payer: Medicare HMO | Admitting: Nurse Practitioner

## 2016-10-13 ENCOUNTER — Encounter: Payer: Self-pay | Admitting: Nurse Practitioner

## 2016-10-24 ENCOUNTER — Other Ambulatory Visit (HOSPITAL_COMMUNITY): Payer: Self-pay

## 2016-10-24 DIAGNOSIS — F332 Major depressive disorder, recurrent severe without psychotic features: Secondary | ICD-10-CM

## 2016-10-24 DIAGNOSIS — F900 Attention-deficit hyperactivity disorder, predominantly inattentive type: Secondary | ICD-10-CM

## 2016-10-24 MED ORDER — AMPHETAMINE-DEXTROAMPHETAMINE 10 MG PO TABS: ORAL_TABLET | ORAL | 0 refills | Status: DC

## 2016-10-24 MED ORDER — FLUOXETINE HCL 40 MG PO CAPS: 40.0000 mg | ORAL_CAPSULE | Freq: Every day | ORAL | 1 refills | Status: DC

## 2016-10-24 MED ORDER — BUSPIRONE HCL 10 MG PO TABS: 10.0000 mg | ORAL_TABLET | Freq: Two times a day (BID) | ORAL | 1 refills | Status: DC

## 2016-10-24 MED ORDER — QUETIAPINE FUMARATE 100 MG PO TABS: 100.0000 mg | ORAL_TABLET | Freq: Every day | ORAL | 1 refills | Status: DC

## 2016-10-25 ENCOUNTER — Telehealth (HOSPITAL_COMMUNITY): Payer: Self-pay | Admitting: Psychiatry

## 2016-10-25 NOTE — Telephone Encounter (Signed)
10/25/16 1:39PM Patient came and pick-up rx script [id number]/[id number]/sh

## 2016-11-09 ENCOUNTER — Other Ambulatory Visit: Payer: Self-pay

## 2016-11-13 ENCOUNTER — Other Ambulatory Visit: Payer: Self-pay

## 2016-11-15 ENCOUNTER — Ambulatory Visit
Admission: RE | Admit: 2016-11-15 | Discharge: 2016-11-15 | Disposition: A | Discharge status: Home / Self Care | Payer: Medicare HMO | Source: Ambulatory Visit | Attending: Neurology | Admitting: Neurology

## 2016-11-15 DIAGNOSIS — H532 Diplopia: Secondary | ICD-10-CM | POA: Diagnosis not present

## 2016-11-15 DIAGNOSIS — G43019 Migraine without aura, intractable, without status migrainosus: Secondary | ICD-10-CM

## 2016-11-15 MED ORDER — GADOBENATE DIMEGLUMINE 529 MG/ML IV SOLN
20.0000 mL | Freq: Once | INTRAVENOUS | Status: AC | PRN
  Administered 2016-11-15: 18 mL via INTRAVENOUS

## 2016-11-16 ENCOUNTER — Telehealth: Payer: Self-pay | Admitting: Neurology

## 2016-11-16 NOTE — Telephone Encounter (Signed)
I called patient. MRI of the brain was relatively unremarkable, minimal white matter changes, small stable pineal cyst. I discussed this with the patient.   MRI brain results:  IMPRESSION:  This MRI of the brain with and without contrast shows the following: 1.    Couple scattered T2/FLAIR hyperintense foci in the deep white matter. This is unchanged when compared to the 11/11/2013 MRI and likely represents negligible chronic microvascular ischemic change. 2.    Small stable pineal cyst. 3.    There are no acute findings and no change when compared to the 11/11/2013 MRI.

## 2016-11-23 ENCOUNTER — Ambulatory Visit (HOSPITAL_COMMUNITY): Payer: Self-pay | Admitting: Psychiatry

## 2016-11-27 ENCOUNTER — Telehealth (HOSPITAL_COMMUNITY): Payer: Self-pay | Admitting: Psychiatry

## 2016-11-27 ENCOUNTER — Other Ambulatory Visit (HOSPITAL_COMMUNITY): Payer: Self-pay | Admitting: Psychiatry

## 2016-11-27 ENCOUNTER — Telehealth (HOSPITAL_COMMUNITY): Payer: Self-pay

## 2016-11-27 DIAGNOSIS — F900 Attention-deficit hyperactivity disorder, predominantly inattentive type: Secondary | ICD-10-CM

## 2016-11-27 MED ORDER — AMPHETAMINE-DEXTROAMPHETAMINE 10 MG PO TABS: ORAL_TABLET | ORAL | 0 refills | Status: DC

## 2016-11-27 NOTE — Telephone Encounter (Signed)
Larena picked up her prescription on [phone] lic [id number] dlo

## 2016-11-27 NOTE — Telephone Encounter (Signed)
Patient is calling for a refill on her Adderall. She will be out after tomorrow. Please review and advise, thank you

## 2016-12-04 ENCOUNTER — Ambulatory Visit (INDEPENDENT_AMBULATORY_CARE_PROVIDER_SITE_OTHER): Payer: Medicare HMO | Admitting: Psychiatry

## 2016-12-04 ENCOUNTER — Encounter (HOSPITAL_COMMUNITY): Payer: Self-pay | Admitting: Psychiatry

## 2016-12-04 DIAGNOSIS — Z811 Family history of alcohol abuse and dependence: Secondary | ICD-10-CM

## 2016-12-04 DIAGNOSIS — F332 Major depressive disorder, recurrent severe without psychotic features: Secondary | ICD-10-CM | POA: Diagnosis not present

## 2016-12-04 DIAGNOSIS — F900 Attention-deficit hyperactivity disorder, predominantly inattentive type: Secondary | ICD-10-CM | POA: Diagnosis not present

## 2016-12-04 DIAGNOSIS — Z818 Family history of other mental and behavioral disorders: Secondary | ICD-10-CM | POA: Diagnosis not present

## 2016-12-04 DIAGNOSIS — Z9889 Other specified postprocedural states: Secondary | ICD-10-CM | POA: Diagnosis not present

## 2016-12-04 DIAGNOSIS — Z79899 Other long term (current) drug therapy: Secondary | ICD-10-CM

## 2016-12-04 MED ORDER — QUETIAPINE FUMARATE 100 MG PO TABS: 100.0000 mg | ORAL_TABLET | Freq: Every day | ORAL | 2 refills | Status: DC

## 2016-12-04 MED ORDER — AMPHETAMINE-DEXTROAMPHETAMINE 10 MG PO TABS: ORAL_TABLET | ORAL | 0 refills | Status: DC

## 2016-12-04 MED ORDER — FLUOXETINE HCL 40 MG PO CAPS: 40.0000 mg | ORAL_CAPSULE | Freq: Every day | ORAL | 2 refills | Status: DC

## 2016-12-04 MED ORDER — BUSPIRONE HCL 10 MG PO TABS: 10.0000 mg | ORAL_TABLET | Freq: Two times a day (BID) | ORAL | 2 refills | Status: DC
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
## 2017-02-06 ENCOUNTER — Other Ambulatory Visit: Payer: Self-pay | Admitting: Neurology

## 2017-02-06 ENCOUNTER — Telehealth: Payer: Self-pay | Admitting: *Deleted

## 2017-02-06 NOTE — Telephone Encounter (Signed)
Called pt. Scheduled f/u for 04/09/17 with MM,NP at 9:00am. Advised CW,MD refilled her antivert. She verbalized understanding.

## 2017-02-06 NOTE — Telephone Encounter (Signed)
-----   Message from York Spaniel, MD sent at 02/06/2017 11:03 AM EDT ----- This patient will need a revisit in 2 or 3 months, okay to see NP

## 2017-02-09 ENCOUNTER — Telehealth (HOSPITAL_COMMUNITY): Payer: Self-pay

## 2017-02-09 DIAGNOSIS — F332 Major depressive disorder, recurrent severe without psychotic features: Secondary | ICD-10-CM

## 2017-02-09 NOTE — Telephone Encounter (Signed)
Patient is calling because she said the current dose odf Buspar (10 mg bid) is not working and she would like to increase the dose. Patient is aware you are not here this week and I told her I would call her on Monday.

## 2017-02-13 LAB — HM PAP SMEAR: HM Pap smear: NEGATIVE

## 2017-02-13 MED ORDER — BUSPIRONE HCL 10 MG PO TABS: 10.0000 mg | ORAL_TABLET | Freq: Three times a day (TID) | ORAL | 0 refills | Status: DC

## 2017-02-13 NOTE — Telephone Encounter (Signed)
Sent new order to the pharmacy fo rBuspar 10 mg tid. I called patient and let her know.

## 2017-02-13 NOTE — Telephone Encounter (Signed)
She can take buspar 10 mg TID

## 2017-02-20 ENCOUNTER — Telehealth (HOSPITAL_COMMUNITY): Payer: Self-pay

## 2017-02-20 ENCOUNTER — Other Ambulatory Visit (HOSPITAL_COMMUNITY): Payer: Self-pay | Admitting: Psychiatry

## 2017-02-20 DIAGNOSIS — F900 Attention-deficit hyperactivity disorder, predominantly inattentive type: Secondary | ICD-10-CM

## 2017-02-20 NOTE — Telephone Encounter (Signed)
Patient called and said she needs a refill on her Adderall, she is almost out and does not follow up until 5/1 - please review and advise, thank you

## 2017-02-22 ENCOUNTER — Other Ambulatory Visit (HOSPITAL_COMMUNITY): Payer: Self-pay | Admitting: Psychiatry

## 2017-02-22 DIAGNOSIS — F900 Attention-deficit hyperactivity disorder, predominantly inattentive type: Secondary | ICD-10-CM

## 2017-02-22 MED ORDER — AMPHETAMINE-DEXTROAMPHETAMINE 10 MG PO TABS: ORAL_TABLET | ORAL | 0 refills | Status: DC

## 2017-02-23 ENCOUNTER — Telehealth (HOSPITAL_COMMUNITY): Payer: Self-pay | Admitting: Psychiatry

## 2017-02-23 NOTE — Telephone Encounter (Signed)
Erin picked up prescription on 5/40/98 lic [id number] dlo

## 2017-02-26 ENCOUNTER — Telehealth: Payer: Self-pay | Admitting: Neurology

## 2017-02-26 MED ORDER — PROMETHAZINE HCL 50 MG PO TABS: 50.0000 mg | ORAL_TABLET | Freq: Four times a day (QID) | ORAL | 3 refills | Status: DC | PRN

## 2017-02-26 NOTE — Addendum Note (Signed)
Addended by: York Spaniel on: 02/26/2017 10:27 AM   Modules accepted: Orders

## 2017-02-26 NOTE — Telephone Encounter (Signed)
error 

## 2017-02-26 NOTE — Telephone Encounter (Signed)
Pt calling for refill of promethazine (PHENERGAN) 25 MG tablet  please call into  [facility] [address] [place], [place] - [address] [phone] (Phone) [phone] (Fax)    Pt would like to know if there could be an increase in dosage

## 2017-02-26 NOTE — Telephone Encounter (Signed)
Phenergan dose was increased to 50 mg tablet.

## 2017-03-06 ENCOUNTER — Ambulatory Visit (INDEPENDENT_AMBULATORY_CARE_PROVIDER_SITE_OTHER): Payer: Medicare HMO | Admitting: Psychiatry

## 2017-03-06 ENCOUNTER — Encounter (HOSPITAL_COMMUNITY): Payer: Self-pay | Admitting: Psychiatry

## 2017-03-06 DIAGNOSIS — Z818 Family history of other mental and behavioral disorders: Secondary | ICD-10-CM | POA: Diagnosis not present

## 2017-03-06 DIAGNOSIS — Z79899 Other long term (current) drug therapy: Secondary | ICD-10-CM | POA: Diagnosis not present

## 2017-03-06 DIAGNOSIS — F900 Attention-deficit hyperactivity disorder, predominantly inattentive type: Secondary | ICD-10-CM | POA: Diagnosis not present

## 2017-03-06 DIAGNOSIS — Z811 Family history of alcohol abuse and dependence: Secondary | ICD-10-CM

## 2017-03-06 DIAGNOSIS — F332 Major depressive disorder, recurrent severe without psychotic features: Secondary | ICD-10-CM | POA: Diagnosis not present

## 2017-03-06 MED ORDER — FLUOXETINE HCL 40 MG PO CAPS: 40.0000 mg | ORAL_CAPSULE | Freq: Every day | ORAL | 1 refills | Status: DC

## 2017-03-06 MED ORDER — AMPHETAMINE-DEXTROAMPHETAMINE 10 MG PO TABS: ORAL_TABLET | ORAL | 0 refills | Status: DC

## 2017-03-06 MED ORDER — BUSPIRONE HCL 15 MG PO TABS: 15.0000 mg | ORAL_TABLET | Freq: Three times a day (TID) | ORAL | 1 refills | Status: DC

## 2017-03-06 MED ORDER — QUETIAPINE FUMARATE 100 MG PO TABS: 100.0000 mg | ORAL_TABLET | Freq: Every day | ORAL | 1 refills | Status: DC
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
## 2017-03-12 ENCOUNTER — Ambulatory Visit (INDEPENDENT_AMBULATORY_CARE_PROVIDER_SITE_OTHER): Payer: Medicare HMO | Admitting: Adult Health

## 2017-03-12 ENCOUNTER — Telehealth: Payer: Self-pay | Admitting: Neurology

## 2017-03-12 ENCOUNTER — Encounter: Payer: Self-pay | Admitting: Adult Health

## 2017-03-12 ENCOUNTER — Telehealth: Payer: Self-pay | Admitting: *Deleted

## 2017-03-12 VITALS — BP 110/82 | HR 80 | Ht 67.0 in | Wt 207.2 lb

## 2017-03-12 DIAGNOSIS — G43019 Migraine without aura, intractable, without status migrainosus: Secondary | ICD-10-CM | POA: Diagnosis not present

## 2017-03-12 MED ORDER — GABAPENTIN 400 MG PO CAPS
400.0000 mg | ORAL_CAPSULE | Freq: Two times a day (BID) | ORAL | 5 refills | Status: DC
Start: 2017-03-12 — End: 2017-04-13

## 2017-03-12 MED ORDER — KETOROLAC TROMETHAMINE 10 MG PO TABS: 10.0000 mg | ORAL_TABLET | Freq: Three times a day (TID) | ORAL | 3 refills | Status: DC | PRN

## 2017-03-12 MED ORDER — DICLOFENAC POTASSIUM 50 MG PO TABS: 50.0000 mg | ORAL_TABLET | Freq: Three times a day (TID) | ORAL | 3 refills | Status: DC | PRN
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
## 2017-03-12 NOTE — Progress Notes (Signed)
I have read the note, and I agree with the clinical assessment and plan.  Valary Manahan KEITH   

## 2017-03-12 NOTE — Telephone Encounter (Signed)
Called patient insurance. Spoke with Brayton CavesJessie. He stated Ketorolac is non-formulary. Other options on formulary for patient that are covered are: Diclofenac sodium EC 25mg  tab or 50mg  tab Ibuprofen 400mg , 600mg , or 800mg  tablet meloxicam 7.5mg  or 15mg  tablet Naproxen 250mg , 375mg  or 500mg  tablet

## 2017-03-12 NOTE — Telephone Encounter (Signed)
I called patient. The Toradol is nonformulary, is required to try the medication before going to this one.  I will try diclofenac 50 mg tablets taking if needed for headache.

## 2017-03-12 NOTE — Patient Instructions (Signed)
Increase gabapentin to 400 mg BID Infusion today If your symptoms worsen or you develop new symptoms please let us know.

## 2017-03-12 NOTE — Telephone Encounter (Signed)
The patient is having frequent migraine headaches, she wishes to have a prescription for Toradol for pain management at home. I will send in a small prescription for this.

## 2017-03-21 ENCOUNTER — Encounter: Payer: Self-pay | Admitting: Family Medicine

## 2017-03-30 ENCOUNTER — Telehealth: Payer: Self-pay | Admitting: Family Medicine

## 2017-03-30 NOTE — Telephone Encounter (Signed)
[facility] records [facility]. Sending back for review.

## 2017-04-09 ENCOUNTER — Ambulatory Visit: Payer: Self-pay | Admitting: Adult Health

## 2017-04-13 ENCOUNTER — Telehealth: Payer: Self-pay | Admitting: Neurology

## 2017-04-13 MED ORDER — TIZANIDINE HCL 2 MG PO TABS: ORAL_TABLET | ORAL | 2 refills | Status: DC

## 2017-04-13 NOTE — Telephone Encounter (Signed)
I called patient. The patient claims that gabapentin is not helpful for her. We will try tizanidine. The patient has depression, she cannot use a beta blocker, she has kidney stones and cannot use Zonegran or Topamax.  I will call in a prescription for the tizanidine, the patient will go to 1 gabapentin tablet daily for one week, then stop.  The patient may be a candidate for Aimovig in the future.

## 2017-04-13 NOTE — Telephone Encounter (Signed)
I tried to call the patient, unable to leave a message, I will call back later. 

## 2017-04-13 NOTE — Telephone Encounter (Signed)
Pt called said she's had a HA since yesterday. She said gabapentin is not helping. She said her entire body aches. Please call

## 2017-04-20 ENCOUNTER — Telehealth: Payer: Self-pay | Admitting: Neurology

## 2017-04-20 MED ORDER — KETOROLAC TROMETHAMINE 10 MG PO TABS: 10.0000 mg | ORAL_TABLET | Freq: Four times a day (QID) | ORAL | 1 refills | Status: DC | PRN

## 2017-04-20 NOTE — Telephone Encounter (Signed)
I called patient. The patient wants a prescription for Toradol, she has recently been given a prescription for diclofenac. I indicated that she cannot use this with the Toradol, we will discontinue the diclofenac.

## 2017-04-20 NOTE — Addendum Note (Signed)
Addended by: Zalia Hautala K on: 04/20/2017 01:25 PM   Modules accepted: Orders  

## 2017-04-20 NOTE — Telephone Encounter (Signed)
Patient called office requesting refill for Toradol, but patient is asking for an increase of medication due to not being strong enough to help with the migraines.  Pharmacy- [facility] [address] [place])

## 2017-04-23 ENCOUNTER — Other Ambulatory Visit (HOSPITAL_COMMUNITY): Payer: Self-pay | Admitting: Psychiatry

## 2017-04-23 ENCOUNTER — Telehealth (HOSPITAL_COMMUNITY): Payer: Self-pay

## 2017-04-23 DIAGNOSIS — F900 Attention-deficit hyperactivity disorder, predominantly inattentive type: Secondary | ICD-10-CM

## 2017-04-23 MED ORDER — AMPHETAMINE-DEXTROAMPHETAMINE 10 MG PO TABS: ORAL_TABLET | ORAL | 0 refills | Status: DC

## 2017-04-23 NOTE — Telephone Encounter (Signed)
Patient has a follow up on 7/9, but needs refill on Adderall. Please review and advise, thank you

## 2017-04-24 ENCOUNTER — Telehealth (HOSPITAL_COMMUNITY): Payer: Self-pay | Admitting: Psychiatry

## 2017-04-24 NOTE — Telephone Encounter (Signed)
[facility] picked up prescription on 1/61/096/19/18 lic [id number] dlo

## 2017-04-26 ENCOUNTER — Telehealth: Payer: Self-pay | Admitting: *Deleted

## 2017-04-26 ENCOUNTER — Other Ambulatory Visit (HOSPITAL_COMMUNITY): Payer: Self-pay | Admitting: Psychiatry

## 2017-04-26 ENCOUNTER — Other Ambulatory Visit: Payer: Self-pay | Admitting: Neurology

## 2017-04-26 DIAGNOSIS — F332 Major depressive disorder, recurrent severe without psychotic features: Secondary | ICD-10-CM

## 2017-04-26 NOTE — Telephone Encounter (Signed)
Submitted PA ketorolac on covermymeds. Key: [id number]. Per covermymeds: "The request has received a Pending outcome. Please note any additional information provided by [facility] at the bottom of your screen.You will receive a final determination electronically in CoverMyMeds and via email and fax within 24 to 72 hours."

## 2017-04-30 ENCOUNTER — Other Ambulatory Visit (HOSPITAL_COMMUNITY): Payer: Self-pay

## 2017-04-30 DIAGNOSIS — F332 Major depressive disorder, recurrent severe without psychotic features: Secondary | ICD-10-CM

## 2017-04-30 MED ORDER — BUSPIRONE HCL 15 MG PO TABS: 15.0000 mg | ORAL_TABLET | Freq: Three times a day (TID) | ORAL | 0 refills | Status: DC

## 2017-04-30 NOTE — Telephone Encounter (Signed)
Checked status of PA on covermymeds. PA Approved on June 23. Questionnaire submitted. PA Case [id number] Status: Pending review. PA Case: [id number], Status: Approved, Coverage Starts on: 04/28/2017 12:00:00 AM, Coverage Ends on: 11/05/2017 12:00:00 AM. Questions? Contact [phone].  [facility] [place].

## 2017-05-14 ENCOUNTER — Ambulatory Visit (INDEPENDENT_AMBULATORY_CARE_PROVIDER_SITE_OTHER): Payer: Medicare HMO | Admitting: Psychiatry

## 2017-05-14 ENCOUNTER — Other Ambulatory Visit (HOSPITAL_COMMUNITY): Payer: Self-pay

## 2017-05-14 ENCOUNTER — Encounter (HOSPITAL_COMMUNITY): Payer: Self-pay | Admitting: Psychiatry

## 2017-05-14 DIAGNOSIS — F332 Major depressive disorder, recurrent severe without psychotic features: Secondary | ICD-10-CM

## 2017-05-14 DIAGNOSIS — F900 Attention-deficit hyperactivity disorder, predominantly inattentive type: Secondary | ICD-10-CM

## 2017-05-14 DIAGNOSIS — Z811 Family history of alcohol abuse and dependence: Secondary | ICD-10-CM | POA: Diagnosis not present

## 2017-05-14 DIAGNOSIS — Z818 Family history of other mental and behavioral disorders: Secondary | ICD-10-CM

## 2017-05-14 MED ORDER — FLUOXETINE HCL 40 MG PO CAPS: 40.0000 mg | ORAL_CAPSULE | Freq: Every day | ORAL | 2 refills | Status: DC

## 2017-05-14 MED ORDER — AMPHETAMINE-DEXTROAMPHETAMINE 10 MG PO TABS: ORAL_TABLET | ORAL | 0 refills | Status: DC

## 2017-05-14 MED ORDER — BUSPIRONE HCL 30 MG PO TABS: 30.0000 mg | ORAL_TABLET | Freq: Two times a day (BID) | ORAL | 2 refills | Status: DC

## 2017-05-14 MED ORDER — QUETIAPINE FUMARATE 100 MG PO TABS: 100.0000 mg | ORAL_TABLET | Freq: Every day | ORAL | 2 refills | Status: DC
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
## 2017-05-16 ENCOUNTER — Other Ambulatory Visit (HOSPITAL_COMMUNITY): Payer: Self-pay | Admitting: Psychiatry

## 2017-05-16 DIAGNOSIS — F332 Major depressive disorder, recurrent severe without psychotic features: Secondary | ICD-10-CM

## 2017-05-21 ENCOUNTER — Encounter: Payer: Self-pay | Admitting: Family Medicine

## 2017-05-21 ENCOUNTER — Ambulatory Visit
Admission: RE | Admit: 2017-05-21 | Discharge: 2017-05-21 | Disposition: A | Discharge status: Home / Self Care | Payer: Medicare HMO | Source: Ambulatory Visit | Attending: Family Medicine | Admitting: Family Medicine

## 2017-05-21 ENCOUNTER — Ambulatory Visit (INDEPENDENT_AMBULATORY_CARE_PROVIDER_SITE_OTHER): Payer: Medicare HMO | Admitting: Family Medicine

## 2017-05-21 VITALS — BP 110/70 | HR 77 | Temp 97.7°F | Resp 16 | Ht 68.0 in | Wt 198.2 lb

## 2017-05-21 DIAGNOSIS — F1910 Other psychoactive substance abuse, uncomplicated: Secondary | ICD-10-CM | POA: Insufficient documentation

## 2017-05-21 DIAGNOSIS — R05 Cough: Secondary | ICD-10-CM

## 2017-05-21 DIAGNOSIS — Z7689 Persons encountering health services in other specified circumstances: Secondary | ICD-10-CM | POA: Diagnosis not present

## 2017-05-21 DIAGNOSIS — R053 Chronic cough: Secondary | ICD-10-CM

## 2017-05-21 DIAGNOSIS — G8929 Other chronic pain: Secondary | ICD-10-CM | POA: Insufficient documentation

## 2017-05-21 DIAGNOSIS — N200 Calculus of kidney: Secondary | ICD-10-CM | POA: Insufficient documentation

## 2017-05-21 DIAGNOSIS — M25562 Pain in left knee: Secondary | ICD-10-CM

## 2017-05-21 DIAGNOSIS — R942 Abnormal results of pulmonary function studies: Secondary | ICD-10-CM | POA: Diagnosis not present

## 2017-05-21 LAB — CBC WITH DIFFERENTIAL/PLATELET
BASOS PCT: 1 %
Basophils Absolute: 88 cells/uL (ref 0–200)
Eosinophils Absolute: 176 cells/uL (ref 15–500)
Eosinophils Relative: 2 %
HEMATOCRIT: 44.4 % (ref 35.0–45.0)
HEMOGLOBIN: 15 g/dL (ref 11.7–15.5)
LYMPHS ABS: 1760 {cells}/uL (ref 850–3900)
Lymphocytes Relative: 20 %
MCH: 30.9 pg (ref 27.0–33.0)
MCHC: 33.8 g/dL (ref 32.0–36.0)
MCV: 91.5 fL (ref 80.0–100.0)
MONO ABS: 528 {cells}/uL (ref 200–950)
MPV: 10.9 fL (ref 7.5–12.5)
Monocytes Relative: 6 %
NEUTROS PCT: 71 %
Neutro Abs: 6248 cells/uL (ref 1500–7800)
Platelets: 288 10*3/uL (ref 140–400)
RBC: 4.85 MIL/uL (ref 3.80–5.10)
RDW: 12.2 % (ref 11.0–15.0)
WBC: 8.8 10*3/uL (ref 4.0–10.5)

## 2017-05-21 LAB — COMPREHENSIVE METABOLIC PANEL
ALBUMIN: 4.1 g/dL (ref 3.6–5.1)
ALK PHOS: 83 U/L (ref 33–115)
ALT: 24 U/L (ref 6–29)
AST: 19 U/L (ref 10–35)
BILIRUBIN TOTAL: 0.4 mg/dL (ref 0.2–1.2)
BUN: 15 mg/dL (ref 7–25)
CALCIUM: 9.5 mg/dL (ref 8.6–10.2)
CO2: 24 mmol/L (ref 20–31)
Chloride: 107 mmol/L (ref 98–110)
Creat: 0.99 mg/dL (ref 0.50–1.10)
Glucose, Bld: 87 mg/dL (ref 65–99)
Potassium: 4 mmol/L (ref 3.5–5.3)
Sodium: 141 mmol/L (ref 135–146)
Total Protein: 7.2 g/dL (ref 6.1–8.1)

## 2017-05-21 NOTE — Progress Notes (Signed)
   Subjective:    Patient ID: Rebecca Andrade, female    DOB: 03/17/1971, 46 y.o.   MRN: [id number]  HPI Chief Complaint  Patient presents with  . new pt    new pt, get established. cough all year. wants blood work but not fasting.    She is new to the practice and here to establish care.  She has a medical history significant for major depressive disorder, ADHD, anxiety, migraines, substance abuse, cocaine abuse, CKD.  Previous medical care: [facility]zz[facility]. [facility] prior to that.   Other providers:  Dr. Anne HahnWillis - neurologist. Treats her for migraines, brain cyst.  Dr. Kathryne SharperSyed Arfeen- [facility] treating her for depression, ADHD, and anxiety.   She complains of a dry cough for years. States she feels like she is always clearing her throat. States it is the same throughout the day. Nothing makes it worse or better.  States she has been told that her cough was related to allergies and has tried taking daily Claritin for approximately one year and no change in her cough.  Denies smoking.  States her fiance smokes but not in the house.  Denies history of allergies or GERD.  Eating does not make cough worse.   Denies fever, chills, rhinorrhea, nasal congestion, ear pain, sinus pressure, sneezing, sore throat, chest pain, palpitations, shortness of breath, orthopnea, abdominal pain, N/V/D. No LE edema.   Asked her about previous history of crack cocaine abuse in her medical record and patient visibly bothered by this. Denies being "addicted to this". States she only tried this drug once.   States she has had pneumonia two times in the past. [facility]  History of bronchitis.   Last Pap smear: [facility] and normal per patient.  Mammogram in the past year and normal.   Social history: Lives with her son and fiance, states she is on disability since 2006 for mental health issues.    Reviewed allergies, medications,  past medical, surgical, family, and social history.    Review of Systems Pertinent positives and negatives in the history of present illness.     Objective:   Physical Exam BP 110/70   Pulse 77   Temp 97.7 F (36.5 C) (Oral)   Resp 16   Ht 5\' 8"  (1.727 m)   Wt 198 lb 3.2 oz (89.9 kg)   LMP 05/07/2017   SpO2 98%   BMI 30.14 kg/m  Alert and in no distress. No sinus tenderness, nares patent. Tympanic membranes and canals are normal. Pharyngeal area is normal. Neck is supple without adenopathy or thyromegaly. Cardiac exam shows a regular sinus rhythm without murmurs or gallops. Lungs are clear to auscultation. Extremities without edema, normal pulses.       Assessment & Plan:  Cough, persistent - Plan: Spirometry with graph, DG Chest 2 View, Ambulatory referral to Pulmonology, CBC with Differential/Platelet, Comprehensive metabolic panel  Encounter to establish care  Abnormal PFT - Plan: DG Chest 2 View, Ambulatory referral to Pulmonology, CBC with Differential/Platelet, Comprehensive metabolic panel  Discussed that she does not appear to have an infection.  Reviewed labs from previous PCP with her.  Abnormal PFT.  Reviewed previous medical records, labs and XR.  Will get a repeat CXR today and refer to pulmonologist.  Follow up pending CXR and labs.

## 2017-05-21 NOTE — Patient Instructions (Addendum)
Since your cough has been going on for years and you have an abnormal pulmonary test today, I am referring you to a pulmonologist for further evaluation. They will call you to schedule an appointment.   We will call you with X ray and lab results.

## 2017-05-22 ENCOUNTER — Encounter: Payer: Self-pay | Admitting: Family Medicine

## 2017-06-11 ENCOUNTER — Ambulatory Visit (INDEPENDENT_AMBULATORY_CARE_PROVIDER_SITE_OTHER): Payer: Medicare HMO | Admitting: Emergency Medicine

## 2017-06-11 ENCOUNTER — Encounter: Payer: Self-pay | Admitting: Emergency Medicine

## 2017-06-11 DIAGNOSIS — R05 Cough: Secondary | ICD-10-CM

## 2017-06-11 DIAGNOSIS — R053 Chronic cough: Secondary | ICD-10-CM | POA: Insufficient documentation

## 2017-06-11 NOTE — Patient Instructions (Addendum)
Try to suppress your urge to throat-clear. Use a sugar free candy and just swallow instead of clearing your throat  We will perform full pulmonary function testing  Start taking your loratadine (Claritin) every day until next visit.  We may consider starting a reflux medication in the future depending on the cough.  Follow with Dr Delton CoombesByrum 6 weeks or next available thereafter, with full PFT
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
## 2017-06-11 NOTE — Assessment & Plan Note (Signed)
Certainty sound like there is a clear upper airway component with a lot of throat clearing. Unclear exacerbating factors, consider rhinitis, consider GERD although she minimizes both. I believe she likely also has some true lower airways obstruction based on her spirometry. She needs full pulmonary function testing to better quantify and evaluate.  Try to suppress your urge to throat-clear. Use a sugar free candy and just swallow instead of clearing your throat  We will perform full pulmonary function testing  Start taking your loratadine (Claritin) every day until next visit.  We may consider starting a reflux medication in the future depending on the cough.  Follow with Dr Delton CoombesByrum 6 weeks or next available thereafter, with full PFT

## 2017-07-03 ENCOUNTER — Institutional Professional Consult (permissible substitution): Payer: Self-pay | Admitting: Internal Medicine

## 2017-07-06 ENCOUNTER — Other Ambulatory Visit: Payer: Self-pay | Admitting: Neurology

## 2017-07-12 ENCOUNTER — Telehealth: Payer: Self-pay | Admitting: *Deleted

## 2017-07-12 MED ORDER — TIZANIDINE HCL 4 MG PO TABS: ORAL_TABLET | ORAL | 3 refills | Status: DC

## 2017-07-12 NOTE — Telephone Encounter (Signed)
I called patient. The patient is having ongoing headaches about every other day. The patient is having a lot of nausea with this. She finds that she get some benefit from tizanidine, we will increase the dose working up to 4 mg 3 times daily.  The patient may be a candidate for Aimovig in the future.

## 2017-07-16 ENCOUNTER — Ambulatory Visit: Payer: Medicare HMO | Admitting: Adult Health

## 2017-07-24 ENCOUNTER — Other Ambulatory Visit (HOSPITAL_COMMUNITY): Payer: Self-pay | Admitting: Psychiatry

## 2017-07-24 ENCOUNTER — Telehealth (HOSPITAL_COMMUNITY): Payer: Self-pay

## 2017-07-24 ENCOUNTER — Telehealth (HOSPITAL_COMMUNITY): Payer: Self-pay | Admitting: Psychiatry

## 2017-07-24 DIAGNOSIS — F900 Attention-deficit hyperactivity disorder, predominantly inattentive type: Secondary | ICD-10-CM

## 2017-07-24 MED ORDER — AMPHETAMINE-DEXTROAMPHETAMINE 10 MG PO TABS: ORAL_TABLET | ORAL | 0 refills | Status: DC

## 2017-07-24 NOTE — Telephone Encounter (Signed)
Illeana picked up prescription on 02/13/80 lic [id number] [id number]

## 2017-07-24 NOTE — Telephone Encounter (Signed)
Patient calling for a refill on her Adderall, she has a follow up with you on 10/2, but will be out this Friday. Please review and advise, thank you

## 2017-08-02 ENCOUNTER — Other Ambulatory Visit (HOSPITAL_COMMUNITY): Payer: Self-pay | Admitting: Psychiatry

## 2017-08-02 ENCOUNTER — Other Ambulatory Visit: Payer: Self-pay | Admitting: Adult Health

## 2017-08-02 DIAGNOSIS — F332 Major depressive disorder, recurrent severe without psychotic features: Secondary | ICD-10-CM

## 2017-08-07 ENCOUNTER — Ambulatory Visit (INDEPENDENT_AMBULATORY_CARE_PROVIDER_SITE_OTHER): Payer: Medicare HMO | Admitting: Psychiatry

## 2017-08-07 ENCOUNTER — Encounter (HOSPITAL_COMMUNITY): Payer: Self-pay | Admitting: Psychiatry

## 2017-08-07 DIAGNOSIS — Z811 Family history of alcohol abuse and dependence: Secondary | ICD-10-CM

## 2017-08-07 DIAGNOSIS — F900 Attention-deficit hyperactivity disorder, predominantly inattentive type: Secondary | ICD-10-CM | POA: Diagnosis not present

## 2017-08-07 DIAGNOSIS — M549 Dorsalgia, unspecified: Secondary | ICD-10-CM

## 2017-08-07 DIAGNOSIS — R05 Cough: Secondary | ICD-10-CM

## 2017-08-07 DIAGNOSIS — Z818 Family history of other mental and behavioral disorders: Secondary | ICD-10-CM

## 2017-08-07 DIAGNOSIS — R51 Headache: Secondary | ICD-10-CM | POA: Diagnosis not present

## 2017-08-07 DIAGNOSIS — F332 Major depressive disorder, recurrent severe without psychotic features: Secondary | ICD-10-CM

## 2017-08-07 MED ORDER — BUSPIRONE HCL 30 MG PO TABS: 30.0000 mg | ORAL_TABLET | Freq: Two times a day (BID) | ORAL | 2 refills | Status: DC

## 2017-08-07 MED ORDER — AMPHETAMINE-DEXTROAMPHETAMINE 10 MG PO TABS: ORAL_TABLET | ORAL | 0 refills | Status: DC

## 2017-08-07 MED ORDER — FLUOXETINE HCL 40 MG PO CAPS: 40.0000 mg | ORAL_CAPSULE | Freq: Every day | ORAL | 2 refills | Status: DC

## 2017-08-07 MED ORDER — QUETIAPINE FUMARATE 100 MG PO TABS: 100.0000 mg | ORAL_TABLET | Freq: Every day | ORAL | 2 refills | Status: DC
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
## 2017-08-09 ENCOUNTER — Ambulatory Visit: Payer: Self-pay | Admitting: Emergency Medicine

## 2017-08-30 ENCOUNTER — Other Ambulatory Visit: Payer: Self-pay | Admitting: Adult Health

## 2017-08-30 ENCOUNTER — Other Ambulatory Visit: Payer: Self-pay | Admitting: Neurology

## 2017-09-06 ENCOUNTER — Other Ambulatory Visit: Payer: Self-pay | Admitting: Nurse Practitioner

## 2017-09-06 ENCOUNTER — Other Ambulatory Visit: Payer: Self-pay | Admitting: Family Medicine

## 2017-09-06 DIAGNOSIS — Z1231 Encounter for screening mammogram for malignant neoplasm of breast: Secondary | ICD-10-CM

## 2017-09-12 ENCOUNTER — Ambulatory Visit: Payer: Medicare HMO | Admitting: Adult Health

## 2017-09-19 ENCOUNTER — Ambulatory Visit: Payer: Self-pay | Admitting: Emergency Medicine

## 2017-10-05 ENCOUNTER — Ambulatory Visit
Admission: RE | Admit: 2017-10-05 | Discharge: 2017-10-05 | Disposition: A | Discharge status: Home / Self Care | Payer: Medicare HMO | Source: Ambulatory Visit | Attending: Family Medicine | Admitting: Family Medicine

## 2017-10-05 DIAGNOSIS — Z1231 Encounter for screening mammogram for malignant neoplasm of breast: Secondary | ICD-10-CM

## 2017-10-09 ENCOUNTER — Ambulatory Visit (HOSPITAL_COMMUNITY): Payer: Self-pay | Admitting: Psychiatry

## 2017-10-19 ENCOUNTER — Telehealth (HOSPITAL_COMMUNITY): Payer: Self-pay

## 2017-10-19 NOTE — Telephone Encounter (Signed)
Patient is calling for a refill on her Adderall - She has an appointment on Monday, I called her and she stated that she does have enough to get to Monday and she will wait

## 2017-10-22 ENCOUNTER — Ambulatory Visit (INDEPENDENT_AMBULATORY_CARE_PROVIDER_SITE_OTHER): Payer: Medicare HMO | Admitting: Psychiatry

## 2017-10-22 ENCOUNTER — Encounter (HOSPITAL_COMMUNITY): Payer: Self-pay | Admitting: Psychiatry

## 2017-10-22 DIAGNOSIS — Z79899 Other long term (current) drug therapy: Secondary | ICD-10-CM

## 2017-10-22 DIAGNOSIS — F900 Attention-deficit hyperactivity disorder, predominantly inattentive type: Secondary | ICD-10-CM | POA: Diagnosis not present

## 2017-10-22 DIAGNOSIS — Z811 Family history of alcohol abuse and dependence: Secondary | ICD-10-CM

## 2017-10-22 DIAGNOSIS — F332 Major depressive disorder, recurrent severe without psychotic features: Secondary | ICD-10-CM | POA: Diagnosis not present

## 2017-10-22 DIAGNOSIS — Z818 Family history of other mental and behavioral disorders: Secondary | ICD-10-CM | POA: Diagnosis not present

## 2017-10-22 MED ORDER — FLUOXETINE HCL 40 MG PO CAPS: 40.0000 mg | ORAL_CAPSULE | Freq: Every day | ORAL | 2 refills | Status: DC

## 2017-10-22 MED ORDER — BUSPIRONE HCL 30 MG PO TABS: 30.0000 mg | ORAL_TABLET | Freq: Two times a day (BID) | ORAL | 2 refills | Status: DC

## 2017-10-22 MED ORDER — AMPHETAMINE-DEXTROAMPHETAMINE 10 MG PO TABS: ORAL_TABLET | ORAL | 0 refills | Status: DC

## 2017-10-22 MED ORDER — QUETIAPINE FUMARATE 100 MG PO TABS: 100.0000 mg | ORAL_TABLET | Freq: Every day | ORAL | 2 refills | Status: DC
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
## 2017-10-31 ENCOUNTER — Other Ambulatory Visit: Payer: Self-pay | Admitting: Neurology

## 2017-11-28 ENCOUNTER — Ambulatory Visit: Payer: Medicare HMO | Admitting: Family Medicine

## 2017-11-28 ENCOUNTER — Encounter: Payer: Self-pay | Admitting: Family Medicine

## 2017-11-28 VITALS — BP 128/80 | HR 72 | Ht 67.0 in | Wt 201.6 lb

## 2017-11-28 DIAGNOSIS — R8290 Unspecified abnormal findings in urine: Secondary | ICD-10-CM

## 2017-11-28 DIAGNOSIS — K5909 Other constipation: Secondary | ICD-10-CM | POA: Diagnosis not present

## 2017-11-28 DIAGNOSIS — N926 Irregular menstruation, unspecified: Secondary | ICD-10-CM | POA: Diagnosis not present

## 2017-11-28 DIAGNOSIS — M545 Low back pain: Secondary | ICD-10-CM

## 2017-11-28 LAB — POCT URINE PREGNANCY: PREG TEST UR: NEGATIVE

## 2017-11-28 LAB — POCT URINALYSIS DIP (PROADVANTAGE DEVICE)
Bilirubin, UA: NEGATIVE
Blood, UA: NEGATIVE
Glucose, UA: NEGATIVE mg/dL
Ketones, POC UA: NEGATIVE mg/dL
Nitrite, UA: NEGATIVE
Protein Ur, POC: NEGATIVE mg/dL
Specific Gravity, Urine: 1.025
Urobilinogen, Ur: NEGATIVE
pH, UA: 6 (ref 5.0–8.0)

## 2017-11-28 MED ORDER — LINACLOTIDE 145 MCG PO CAPS: 145.0000 ug | ORAL_CAPSULE | Freq: Every day | ORAL | 0 refills | Status: DC

## 2017-11-28 NOTE — Progress Notes (Signed)
Chief Complaint  Patient presents with  . Back Pain    lbp on both sides, period is 1.5 months late. Also having some constipation and bloating. Very uncomfotable. Keeping her up at night. Nothing OTC working, Miralax, fiber, sodium citrate.    She presents today due to bloating, causing back pain, late period and ongoing constipation. The discomfort in her stomach is keeping her awake at night. She reports this has been going on for a couple of months, though constipation has been since she was 15.  Last period was mid-November. She notes some irregularities in her cycles, being late, over the last few months. She is engaged, in a monogamous relationship, but admits she doesn't always use condoms.  She is extremely worried that she might be pregnant. She has some hot flashes, mild, no night sweats.  She has dealt with constipation "forever", recalls starting age 47.  Activia, Miralax didn't help.  She takes Miralax 3x/day, per the recommendation of her GI when she had her colonoscopy. She has seen [facility] in the past, did her colonoscopy.  She reports it was normal. She can't recall other medications she took, recalls only a syrup, not amitiza or linzess. Denies diarrhea (except with illnesses).  Last BM was 4 days ago--small hard stool, didn't empty. She drinks 5-6 bottles of water/day She takes fibercon (though stopped, didn't find it helped), eats cereal with fiber, vegetables  Denies any changes to her medications. No changes in diet. Cut out fried foods, breads.  Occasional milk in her cereal, uses whole milk.  Very hard to get a good history from her, as she is very frustrated, and keeps repeating her complaints.  Unable to get a timeline, or details of how she has taken meds or when.  I think she is still taking miralax TID (runs out, but gets more), not really sure about anything else--mentions lots of things she has tried, but I don't believe any were recent.  PMH, PSH, SH  reviewed  Outpatient Encounter Medications as of 11/28/2017  Medication Sig Note  . amphetamine-dextroamphetamine (ADDERALL) 10 MG tablet Take 2 in am and 2 at noon   . busPIRone (BUSPAR) 30 MG tablet Take 1 tablet (30 mg total) by mouth 2 (two) times daily.   [name]. FLUoxetine (PROZAC) 40 MG capsule Take 1 capsule (40 mg total) by mouth daily.   . QUEtiapine (SEROQUEL) 100 MG tablet Take 1 tablet (100 mg total) by mouth at bedtime.   [name]. tiZANidine (ZANAFLEX) 4 MG tablet 1 tablet twice daily for one week, then take 1 tablet 3 times daily   . ketorolac (TORADOL) 10 MG tablet Take 1 tablet (10 mg total) by mouth every 6 (six) hours as needed. (Patient not taking: Reported on 11/28/2017) 11/28/2017: Uses prn migraines  . meclizine (ANTIVERT) 25 MG tablet TAKE 1 TABLET BY MOUTH THREE TIMES DAILY AS NEEDED FOR  DIZZINESS (Patient not taking: Reported on 11/28/2017) 11/28/2017: Prn vertigo, last a week ago  . promethazine (PHENERGAN) 50 MG tablet Take 1 tablet (50 mg total) by mouth every 6 (six) hours as needed for nausea or vomiting. (Patient not taking: Reported on 11/28/2017)   . [DISCONTINUED] ketorolac (TORADOL) 10 MG tablet TAKE 1 TABLET BY MOUTH EVERY 8 HOURS AS NEEDED   . [DISCONTINUED] ketorolac (TORADOL) 10 MG tablet TAKE 1 TABLET BY MOUTH EVERY 8 HOURS AS NEEDED   . [DISCONTINUED] meclizine (ANTIVERT) 25 MG tablet TAKE 1 TABLET BY MOUTH THREE TIMES DAILY AS NEEDED FOR  DIZZINESS    No facility-administered encounter medications on file as of 11/28/2017.    No Known Allergies  ROS: no URI symptoms, cough, shortness of breath. Denies fever, chills, urinary complaints (no dysuria, urgency, frequency, odor), vaginal discharge. Some nausea, but no vomiting. Denies hair/skin/nail changes. Chronic constipation.   Bad gums, losing her teeth. See HPI.   PHYSICAL EXAM:  BP 128/80   Pulse 72   Ht 5\' 7"  (1.702 m)   Wt 201 lb 9.6 oz (91.4 kg)   LMP 09/19/2017 (Approximate)   BMI 31.58 kg/m   Wt  Readings from Last 3 Encounters:  11/28/17 201 lb 9.6 oz (91.4 kg)  06/11/17 201 lb (91.2 kg)  05/21/17 198 lb 3.2 oz (89.9 kg)   Obese, anxious female, appears frustrated and repeating her complaints (as though I wasn't listening or acknowledging them, as I was just trying to get more detailed information). HEENT: conjunctiva and sclera are clear, EOMI Neck: no lymphadenopathy or thyromegaly Heart: regular rate and rhythm Lungs: clear bialterally Abdomen: soft, normal bowel sounds. No organomegaly or mass. Mild diffuse tenderness across central abdomen and LLQ.  No mass, rebound or guarding. No suprapubic tenderness Back: no CVA tenderness or spinal tenderness. Mildly tender bilateral paraspinous muscles in lumbar area  Urine dip  2+ leuks, otherwise normal Urine pregnancy test negative   ASSESSMENT/PLAN:  Chronic constipation - Trial Linzess (starting at 145 dose, no known dx IBS); f/u with GI if not better. Counseled re: fluid, fiber, OTC meds, gas-X prn pain - Plan: TSH, linaclotide (LINZESS) 145 MCG CAPS capsule  Irregular menses - suspect perimenopausal; will check TSH. encouraged proper contraception - Plan: POCT urine pregnancy  Abnormal urinalysis - no urinary complaints (just diffuse bloating/pain, including LBP).  will check culture, hold off on any treatment - Plan: Urine Culture  Low back pain, unspecified back pain laterality, unspecified chronicity, with sciatica presence unspecified - Plan: POCT Urinalysis DIP (Proadvantage Device)   Check TSH--last checked 2 years ago. Contraception encouraged   Try a lactose-free diet to see if that decreases your bloating (ie use Lactaid milk or almond or soy milk). Use simethicone (gas-X) as needed when the bloating and pain is worse.  You likely will need to go back to [facility] to further discuss your chronic constipation.  We will try you on a medication while waiting for this appointment to see if it helps at all. Start  taking Linzess once daily.  If this isn't effective for you, they may want to adjust the dose vs change to a different medication. Continue to try and follow a high fiber diet.  I recommend fiber supplements such as Benefiber or other powders, rather than tablets, as taking fiber without enough water can lead to worsening bloating.  You may try a Fleet's enema and/or Magnesium Citrate to see if that help give some more immediate relief for your constipation.  These should not be used regularly.  Your pregnancy test was negative.  If your thyroid is normal, then your irregular periods are likely related to some hormonal changes that are normal with age (perimenopause).

## 2017-11-28 NOTE — Patient Instructions (Signed)
  Try a lactose-free diet to see if that decreases your bloating (ie use Lactaid milk or almond or soy milk). Use simethicone (gas-X) as needed when the bloating and pain is worse.  You likely will need to go back to [facility] to further discuss your chronic constipation.  We will try you on a medication while waiting for this appointment to see if it helps at all. Start taking Linzess once daily.  If this isn't effective for you, they may want to adjust the dose vs change to a different medication. Continue to try and follow a high fiber diet.  I recommend fiber supplements such as Benefiber or other powders, rather than tablets, as taking fiber without enough water can lead to worsening bloating.  You may try a Fleet's enema and/or Magnesium Citrate to see if that help give some more immediate relief for your constipation.  These should not be used regularly.  Your pregnancy test was negative.  If your thyroid is normal, then your irregular periods are likely related to some hormonal changes that are normal with age (perimenopause).     Constipation, Adult Constipation is when a person has fewer bowel movements in a week than normal, has difficulty having a bowel movement, or has stools that are dry, hard, or larger than normal. Constipation may be caused by an underlying condition. It may become worse with age if a person takes certain medicines and does not take in enough fluids. Follow these instructions at home: Eating and drinking   Eat foods that have a lot of fiber, such as fresh fruits and vegetables, whole grains, and beans.  Limit foods that are high in fat, low in fiber, or overly processed, such as french fries, hamburgers, cookies, candies, and soda.  Drink enough fluid to keep your urine clear or pale yellow. General instructions  Exercise regularly or as told by your health care provider.  Go to the restroom when you have the urge to go. Do not hold it in.  Take  over-the-counter and prescription medicines only as told by your health care provider. These include any fiber supplements.  Practice pelvic floor retraining exercises, such as deep breathing while relaxing the lower abdomen and pelvic floor relaxation during bowel movements.  Watch your condition for any changes.  Keep all follow-up visits as told by your health care provider. This is important. Contact a health care provider if:  You have pain that gets worse.  You have a fever.  You do not have a bowel movement after 4 days.  You vomit.  You are not hungry.  You lose weight.  You are bleeding from the anus.  You have thin, pencil-like stools. Get help right away if:  You have a fever and your symptoms suddenly get worse.  You leak stool or have blood in your stool.  Your abdomen is bloated.  You have severe pain in your abdomen.  You feel dizzy or you faint. This information is not intended to replace advice given to you by your health care provider. Make sure you discuss any questions you have with your health care provider. Document Released: 07/21/2004 Document Revised: 05/12/2016 Document Reviewed: 04/12/2016 Elsevier Interactive Patient Education  2018 [employer]

## 2017-11-29 ENCOUNTER — Other Ambulatory Visit: Payer: Self-pay | Admitting: Neurology

## 2017-11-29 LAB — TSH: TSH: 1.13 u[IU]/mL (ref 0.450–4.500)

## 2017-11-29 LAB — URINE CULTURE

## 2017-12-01 ENCOUNTER — Other Ambulatory Visit: Payer: Self-pay | Admitting: Neurology

## 2017-12-10 ENCOUNTER — Telehealth: Payer: Self-pay | Admitting: *Deleted

## 2017-12-10 NOTE — Telephone Encounter (Signed)
Submitted PA ketorolac 10mg  tablet on covermymeds. Key: [id number]. [facility] on [facility].

## 2017-12-11 NOTE — Telephone Encounter (Signed)
[facility] ketorolac: PA Case: [id number], Status: Approved, Coverage Starts on: 11/06/2017 12:00:00 AM, Coverage Ends on: 11/05/2018 12:00:00 AM. Questions? Contact [phone].

## 2017-12-28 ENCOUNTER — Encounter: Payer: Self-pay | Admitting: Family Medicine

## 2017-12-28 ENCOUNTER — Ambulatory Visit: Payer: Medicare HMO | Admitting: Family Medicine

## 2017-12-28 VITALS — BP 104/64 | HR 87 | Wt 203.4 lb

## 2017-12-28 DIAGNOSIS — K5909 Other constipation: Secondary | ICD-10-CM | POA: Diagnosis not present

## 2017-12-28 MED ORDER — LINACLOTIDE 290 MCG PO CAPS: 290.0000 ug | ORAL_CAPSULE | Freq: Every day | ORAL | 1 refills | Status: DC

## 2017-12-28 NOTE — Patient Instructions (Signed)
Take the Linzess on an empty stomach with a glass of water. Do not eat for at least 30 minutes after taking this.   You will receive a phone call from [facility] regarding the constipation study.   Follow up in 4-6 weeks or sooner if needed.

## 2017-12-28 NOTE — Progress Notes (Signed)
   Subjective:    Patient ID: Rebecca Andrade, female    DOB: 03/01/1971, 47 y.o.   MRN: [id number]  HPI Chief Complaint  Patient presents with  . chronic constipation    Dr. Lynelle DoctorKnapp put her on Linness and wants to be up on doseage   She is a caucasian 47 year old female with a history of chronic constipation, apparently since age 89 who is here for the same.  She was having bloating and difficulty with bowel movements, states Miralax 3 doses daily was not working so she saw Dr. Lynelle DoctorKnapp on 11/28/2017 and started on Linzess. Reports having small amounts of loose and occasionally watery stools since starting on Linzess. She reports taking the medication with food.   No longer having bloating or pain. Denies fever, chills, nausea, vomiting.   States she does not recall having spontaneous bowel movements at least since age 47.   Reports having a colonoscopy in the past 3-4 years with [facility]. States no polyps and no blockages.  Denies history of abdominal surgery.   Reviewed allergies, medications, past medical, surgical, family, and social history.    Review of Systems Pertinent positives and negatives in the history of present illness.     Objective:   Physical Exam BP 104/64   Pulse 87   Wt 203 lb 6.4 oz (92.3 kg)   BMI 31.86 kg/m  Alert and in no distress.  Pharyngeal area is normal. Neck is supple without adenopathy or thyromegaly. Cardiac exam shows a regular sinus rhythm without murmurs or gallops. Lungs are clear to auscultation. Abdomen is soft, non distended, bowel sounds present throughout, non tender, no guarding or rebound.        Assessment & Plan:  Chronic constipation  She is having some success with Linzess. Will increase her dose and have her take it on an empty stomach, she has been taking it with food and having loose stools.  She is interested in having [facility] contact her regarding chronic constipation study.  If she decides to not do the study she will  follow up with me in 4-6 weeks.

## 2018-01-22 ENCOUNTER — Encounter (HOSPITAL_COMMUNITY): Payer: Self-pay | Admitting: Psychiatry

## 2018-01-22 ENCOUNTER — Ambulatory Visit (INDEPENDENT_AMBULATORY_CARE_PROVIDER_SITE_OTHER): Payer: Medicare HMO | Admitting: Psychiatry

## 2018-01-22 DIAGNOSIS — Z818 Family history of other mental and behavioral disorders: Secondary | ICD-10-CM | POA: Diagnosis not present

## 2018-01-22 DIAGNOSIS — K5909 Other constipation: Secondary | ICD-10-CM | POA: Diagnosis not present

## 2018-01-22 DIAGNOSIS — F332 Major depressive disorder, recurrent severe without psychotic features: Secondary | ICD-10-CM | POA: Diagnosis not present

## 2018-01-22 DIAGNOSIS — F900 Attention-deficit hyperactivity disorder, predominantly inattentive type: Secondary | ICD-10-CM

## 2018-01-22 DIAGNOSIS — Z79899 Other long term (current) drug therapy: Secondary | ICD-10-CM | POA: Diagnosis not present

## 2018-01-22 DIAGNOSIS — Z811 Family history of alcohol abuse and dependence: Secondary | ICD-10-CM

## 2018-01-22 MED ORDER — AMPHETAMINE-DEXTROAMPHETAMINE 10 MG PO TABS: ORAL_TABLET | ORAL | 0 refills | Status: DC

## 2018-01-22 MED ORDER — BUSPIRONE HCL 30 MG PO TABS: 30.0000 mg | ORAL_TABLET | Freq: Two times a day (BID) | ORAL | 2 refills | Status: DC

## 2018-01-22 MED ORDER — FLUOXETINE HCL 40 MG PO CAPS: 40.0000 mg | ORAL_CAPSULE | Freq: Every day | ORAL | 2 refills | Status: DC

## 2018-01-22 MED ORDER — QUETIAPINE FUMARATE 100 MG PO TABS: 100.0000 mg | ORAL_TABLET | Freq: Every day | ORAL | 2 refills | Status: DC
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
## 2018-01-27 ENCOUNTER — Other Ambulatory Visit: Payer: Self-pay | Admitting: Neurology

## 2018-01-28 ENCOUNTER — Other Ambulatory Visit: Payer: Self-pay | Admitting: Neurology

## 2018-01-28 MED ORDER — PROMETHAZINE HCL 50 MG PO TABS: 50.0000 mg | ORAL_TABLET | Freq: Three times a day (TID) | ORAL | 1 refills | Status: DC | PRN

## 2018-01-28 NOTE — Telephone Encounter (Signed)
[facility] [place] - [address] [phone] (Phone) [phone] (Fax)  Faxed printed/signed rx promethazine to pharmacy above. Received fax confirmation.

## 2018-02-07 ENCOUNTER — Ambulatory Visit: Payer: Self-pay | Admitting: Family Medicine

## 2018-02-14 ENCOUNTER — Ambulatory Visit: Payer: Medicare HMO | Admitting: Family Medicine

## 2018-02-25 ENCOUNTER — Ambulatory Visit: Payer: Medicare HMO | Admitting: Family Medicine

## 2018-02-25 ENCOUNTER — Other Ambulatory Visit (HOSPITAL_COMMUNITY): Payer: Self-pay | Admitting: Psychiatry

## 2018-02-25 ENCOUNTER — Other Ambulatory Visit: Payer: Self-pay | Admitting: Neurology

## 2018-02-25 ENCOUNTER — Other Ambulatory Visit: Payer: Self-pay | Admitting: Family Medicine

## 2018-02-25 ENCOUNTER — Telehealth: Payer: Self-pay | Admitting: *Deleted

## 2018-02-25 DIAGNOSIS — F332 Major depressive disorder, recurrent severe without psychotic features: Secondary | ICD-10-CM

## 2018-02-25 NOTE — Telephone Encounter (Signed)
Called pt. Advised we received refill request on meclizine and tizanidine. Her last office visit was 03/12/17 w/ MM,NP and has no pending appt. She needs to be seen in office to continue receiving refills. Refill requests can be addressed at follow up w/ MD. Made f/u for 02/26/18 at 1030am w/ Dr. Anne HahnWillis. Advised her to check in 10am. She verbalized understanding and appreciation for call.

## 2018-02-26 ENCOUNTER — Ambulatory Visit: Payer: Self-pay | Admitting: Neurology

## 2018-02-26 ENCOUNTER — Ambulatory Visit: Payer: Medicare HMO | Admitting: Family Medicine

## 2018-02-28 ENCOUNTER — Ambulatory Visit: Payer: Medicare HMO | Admitting: Family Medicine

## 2018-03-04 ENCOUNTER — Ambulatory Visit (INDEPENDENT_AMBULATORY_CARE_PROVIDER_SITE_OTHER): Payer: Medicare HMO | Admitting: Family Medicine

## 2018-03-04 ENCOUNTER — Encounter: Payer: Self-pay | Admitting: Family Medicine

## 2018-03-04 DIAGNOSIS — K5909 Other constipation: Secondary | ICD-10-CM | POA: Diagnosis not present

## 2018-03-04 HISTORY — DX: Other constipation: K59.09

## 2018-03-04 NOTE — Patient Instructions (Addendum)
Continue on the current dose of Linzess.   Try increasing your physical activity and staying well hydrated.   If you have any worsening symptoms then let me know. Our next step would be referring you to GI.

## 2018-03-04 NOTE — Progress Notes (Signed)
   Subjective:    Patient ID: Rebecca Andrade, female    DOB: May 24, 1971, 47 y.o.   MRN: [id number]  HPI Chief Complaint  Patient presents with  . follow-up    follow-up on linzess. she did not go through with the [facility] study. doing well on med   She is here to follow up on chronic constipation. She is taking Linzess and increased to the highest dose of this 2 months ago. Reports taking it every morning 30 minutes before breakfast.   States she has a watery bowel movement every morning but states she "does not think the medication is working well enough". States she had a bowel movement in the past every week or so and admits that her symptoms have improved.  Reports having occasional left sided bloating and mild abdominal pain. Also reports intermittent nausea. These symptoms have been ongoing since age 17. This is not as bothersome as in the past.   Reports a 8 lb weight gain over the past few months.   States she had a normal colonoscopy 5 years ago. She thinks this was at [facility].   Denies fever, chills, unexplained weight loss, chest pain, palpitations, shortness of breath, vomiting, urinary symptoms.   Reviewed allergies, medications, past medical, surgical, family, and social history.     Review of Systems Pertinent positives and negatives in the history of present illness.     Objective:   Physical Exam BP 120/70   Pulse 90   Ht  (1.727 m)   Wt 205 lb 12.8 oz (93.4 kg)   SpO2 98%   BMI 31.29 kg/m   Alert and in no distress. Tympanic membranes and canals are normal. Pharyngeal area is normal. Neck is supple without adenopathy or thyromegaly. Cardiac exam shows a regular sinus rhythm without murmurs or gallops. Lungs are clear to auscultation.  Abdomen soft, nondistended, normal bowel sounds, mild left-sided upper and lower tenderness without rebound or guarding.  No hepatosplenomegaly.  Extremities without edema.  Skin is warm and dry, no pallor.         Assessment & Plan:  Chronic constipation  Discussed that her symptoms have improved on Linzess and she would like to continue on this medication for now. Will attempt to get her colonoscopy report and if she notices any new or worsening symptoms I will have her follow up with GI.  Discussed staying well hydrated, including fiber in her diet and start exercising.  Follow up as needed.

## 2018-03-08 ENCOUNTER — Encounter: Payer: Self-pay | Admitting: Family Medicine

## 2018-03-14 ENCOUNTER — Telehealth: Payer: Self-pay | Admitting: Family Medicine

## 2018-03-14 NOTE — Telephone Encounter (Signed)
Received requested colonoscopy from [facility]. Sending back for review.  °

## 2018-03-19 ENCOUNTER — Encounter: Payer: Self-pay | Admitting: Internal Medicine

## 2018-03-21 ENCOUNTER — Telehealth (HOSPITAL_COMMUNITY): Payer: Self-pay

## 2018-03-21 ENCOUNTER — Other Ambulatory Visit (HOSPITAL_COMMUNITY): Payer: Self-pay | Admitting: Psychiatry

## 2018-03-21 DIAGNOSIS — F900 Attention-deficit hyperactivity disorder, predominantly inattentive type: Secondary | ICD-10-CM

## 2018-03-21 MED ORDER — AMPHETAMINE-DEXTROAMPHETAMINE 10 MG PO TABS: ORAL_TABLET | ORAL | 0 refills | Status: DC

## 2018-03-21 NOTE — Telephone Encounter (Signed)
Prescription called into her pharmacy

## 2018-03-21 NOTE — Telephone Encounter (Signed)
Patient has an appointment next month and needs a refill on her Adderall, patient uses [facility] on [address], thank you

## 2018-03-21 NOTE — Telephone Encounter (Signed)
I called patient and let her know prescription is at the [facility]

## 2018-03-27 ENCOUNTER — Other Ambulatory Visit (HOSPITAL_COMMUNITY): Payer: Self-pay | Admitting: Psychiatry

## 2018-03-27 DIAGNOSIS — F332 Major depressive disorder, recurrent severe without psychotic features: Secondary | ICD-10-CM

## 2018-04-15 ENCOUNTER — Other Ambulatory Visit (HOSPITAL_COMMUNITY): Payer: Self-pay | Admitting: Psychiatry

## 2018-04-15 DIAGNOSIS — F332 Major depressive disorder, recurrent severe without psychotic features: Secondary | ICD-10-CM

## 2018-04-16 ENCOUNTER — Other Ambulatory Visit (HOSPITAL_COMMUNITY): Payer: Self-pay

## 2018-04-16 DIAGNOSIS — F332 Major depressive disorder, recurrent severe without psychotic features: Secondary | ICD-10-CM

## 2018-04-16 MED ORDER — QUETIAPINE FUMARATE 100 MG PO TABS: 100.0000 mg | ORAL_TABLET | Freq: Every day | ORAL | 0 refills | Status: DC

## 2018-04-16 NOTE — Progress Notes (Unsigned)
Patient called and was out of seroquel, she has an appointment next week, per protocol I sent in a 30 day supply to her pharmacy

## 2018-04-22 ENCOUNTER — Other Ambulatory Visit (HOSPITAL_COMMUNITY): Payer: Self-pay

## 2018-04-22 DIAGNOSIS — F332 Major depressive disorder, recurrent severe without psychotic features: Secondary | ICD-10-CM

## 2018-04-22 MED ORDER — QUETIAPINE FUMARATE 100 MG PO TABS: 100.0000 mg | ORAL_TABLET | Freq: Every day | ORAL | 0 refills | Status: DC

## 2018-04-24 ENCOUNTER — Ambulatory Visit (INDEPENDENT_AMBULATORY_CARE_PROVIDER_SITE_OTHER): Payer: Medicare HMO | Admitting: Psychiatry

## 2018-04-24 ENCOUNTER — Ambulatory Visit: Payer: Medicare HMO | Admitting: Family Medicine

## 2018-04-24 ENCOUNTER — Encounter: Payer: Self-pay | Admitting: Family Medicine

## 2018-04-24 ENCOUNTER — Encounter (HOSPITAL_COMMUNITY): Payer: Self-pay | Admitting: Psychiatry

## 2018-04-24 VITALS — BP 120/80 | HR 80 | Temp 98.2°F | Resp 16 | Wt 201.2 lb

## 2018-04-24 DIAGNOSIS — R21 Rash and other nonspecific skin eruption: Secondary | ICD-10-CM

## 2018-04-24 DIAGNOSIS — F900 Attention-deficit hyperactivity disorder, predominantly inattentive type: Secondary | ICD-10-CM | POA: Diagnosis not present

## 2018-04-24 DIAGNOSIS — F332 Major depressive disorder, recurrent severe without psychotic features: Secondary | ICD-10-CM | POA: Diagnosis not present

## 2018-04-24 MED ORDER — TRIAMCINOLONE ACETONIDE 0.1 % EX CREA: 1.0000 "application " | TOPICAL_CREAM | Freq: Two times a day (BID) | CUTANEOUS | 0 refills | Status: DC

## 2018-04-24 MED ORDER — QUETIAPINE FUMARATE 100 MG PO TABS: 100.0000 mg | ORAL_TABLET | Freq: Every day | ORAL | 2 refills | Status: DC

## 2018-04-24 MED ORDER — AMPHETAMINE-DEXTROAMPHETAMINE 10 MG PO TABS: ORAL_TABLET | ORAL | 0 refills | Status: DC

## 2018-04-24 MED ORDER — FLUOXETINE HCL 40 MG PO CAPS: 40.0000 mg | ORAL_CAPSULE | Freq: Every day | ORAL | 2 refills | Status: DC
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
## 2018-04-24 NOTE — Progress Notes (Signed)
   Subjective:    Patient ID: Rebecca Andrade, female    DOB: 10/17/1971, 47 y.o.   MRN: [id number]  HPI Chief Complaint  Patient presents with  . rash    rash on left arm and lower back   She is here with complaints of a 2 day history of a pruritic rash on her left upper arm, left side, upper and lower back. States she has been sleeping at a friend's house for the past 2 nights. Did not see any bugs but she is worried this could be related to bug bites.   Has not taken anything for her symptoms.   No fever, chills, headache, dizziness, sore throat, cough, shortness of breath, joint pain, N/V/D.   Review of Systems Pertinent positives and negatives in the history of present illness.     Objective:   Physical Exam BP 120/80   Pulse 80   Temp 98.2 F (36.8 C) (Oral)   Resp 16   Wt 201 lb 3.2 oz (91.3 kg)   SpO2 98%   BMI 31.51 kg/m   Small, slightly erythematous raised red bumps that are pruritic to her left posterior upper arm, upper and lower back as well as her left abdomen. No particular pattern. No sign of infection.       Assessment & Plan:  Rash and nonspecific skin eruption - Plan: triamcinolone cream (KENALOG) 0.1 %  Discussed that this could be related to bed bugs but cannot say with certainty. Encouraged her to wash all of her clothes, linens etc in hot water and dry on high heat. No sign of infection. Use steroid cream and may take Benadryl as needed for itching. Cool compresses as well. She will have her friend call a pest company.

## 2018-04-24 NOTE — Patient Instructions (Signed)
Wash all of your linens, clothing etc in hot water and dry on hight heat.   Use the steroid on the rash. Use cool compresses to control itching as well as Benadryl as needed for itching.

## 2018-04-25 ENCOUNTER — Encounter: Payer: Self-pay | Admitting: Family Medicine

## 2018-04-25 ENCOUNTER — Ambulatory Visit (INDEPENDENT_AMBULATORY_CARE_PROVIDER_SITE_OTHER): Payer: Medicare HMO | Admitting: Family Medicine

## 2018-04-25 VITALS — BP 136/80 | HR 80 | Temp 98.5°F | Resp 16 | Ht 68.0 in | Wt 199.8 lb

## 2018-04-25 DIAGNOSIS — R21 Rash and other nonspecific skin eruption: Secondary | ICD-10-CM | POA: Diagnosis not present

## 2018-04-25 MED ORDER — PREDNISONE 10 MG (21) PO TBPK: ORAL_TABLET | Freq: Every day | ORAL | 0 refills | Status: DC

## 2018-04-25 NOTE — Patient Instructions (Addendum)
You do not appear to have an infection. Take the steroid dose pak as prescribed. Drink plenty of water.   Use cool compresses and Benadryl as needed for itching.   Prednisone delayed-release tablets What is this medicine? PREDNISONE (PRED ni sone) is a corticosteroid. It is commonly used to treat inflammation of the skin, joints, lungs, and other organs. Common conditions treated include asthma, allergies, and arthritis. It is also used for other conditions, such as blood disorders and diseases of the adrenal glands. This medicine may be used for other purposes; ask your health care provider or pharmacist if you have questions. COMMON BRAND NAME(S): RAYOS What should I tell my health care provider before I take this medicine? They need to know if you have any of these conditions: -Cushing's syndrome -diabetes -glaucoma -heart disease -high blood pressure -infection (especially a virus infection such as chickenpox, cold sores, or herpes) -kidney disease -liver disease -mental illness -myasthenia gravis -osteoporosis -seizures -stomach or intestine problems -thyroid disease -an unusual or allergic reaction to lactose, prednisone, other medicines, foods, dyes, or preservatives -pregnant or trying to get pregnant -breast-feeding How should I use this medicine? Take this medicine by mouth with a glass of water. Follow the directions on the prescription label. Take this medicine with food. Do not cut, crush or chew this medicine. Do not suddenly stop taking your medicine because you may develop a severe reaction. If your doctor wants you to stop the medicine, the dose may be slowly lowered over time to avoid any side effects. Talk to your pediatrician regarding the use of this medicine in children. Special care may be needed. Overdosage: If you think you have taken too much of this medicine contact a poison control center or emergency room at once. NOTE: This medicine is only for you. Do  not share this medicine with others. What if I miss a dose? If you miss a dose, take it as soon as you can. If it is almost time for your next dose, talk to your doctor or health care professional. You may need to miss a dose or take an extra dose. Do not take double or extra doses without advice. What may interact with this medicine? Do not take this medicine with any of the following medications: -metyrapone -mifepristone This medicine may also interact with the following medications: -aminoglutethimide -amphotericin B -aspirin and aspirin-like medicines -barbiturates -certain medicines for diabetes, like glipizide or glyburide -cholestyramine -cholinesterase inhibitors -cyclosporine -digoxin -diuretics -ephedrine -female hormones, like estrogens and birth control pills -isoniazid -ketoconazole -NSAIDS, medicines for pain and inflammation, like ibuprofen or naproxen -phenytoin -rifampin -toxoids -vaccines -warfarin This list may not describe all possible interactions. Give your health care provider a list of all the medicines, herbs, non-prescription drugs, or dietary supplements you use. Also tell them if you smoke, drink alcohol, or use illegal drugs. Some items may interact with your medicine. What should I watch for while using this medicine? Visit your doctor or health care professional for regular checks on your progress. If you are taking this medicine over a prolonged period, carry an identification card with your name and address, the type and dose of your medicine, and your doctor's name and address. This medicine may increase your risk of getting an infection. Tell your doctor or health care professional if you are around anyone with measles or chickenpox, or if you develop sores or blisters that do not heal properly. If you are going to have surgery, tell your doctor or health care  professional that you have taken this medicine within the last twelve months. Ask your  doctor or health care professional about your diet. You may need to lower the amount of salt you eat. This medicine may affect blood sugar levels. If you have diabetes, check with your doctor or health care professional before you change your diet or the dose of your diabetic medicine. What side effects may I notice from receiving this medicine? Side effects that you should report to your doctor or health care professional as soon as possible: -allergic reactions like skin rash, itching or hives, swelling of the face, lips, or tongue -changes in emotions or moods -changes in vision -depressed mood -eye pain -fever or chills, cough, sore throat, pain or difficulty passing urine -increased thirst -swelling of ankles, feet Side effects that usually do not require medical attention (report to your doctor or health care professional if they continue or are bothersome): -confusion, excitement, restlessness -headache -nausea, vomiting -skin problems, acne, thin and shiny skin -trouble sleeping -weight gain This list may not describe all possible side effects. Call your doctor for medical advice about side effects. You may report side effects to FDA at 1-800-FDA-1088. Where should I keep my medicine? Keep out of the reach of children. Store at room temperature between 15 and 30 degrees C (59 and 86 degrees F). Protect from light and moisture. Keep container tightly closed. Throw away any unused medicine after the expiration date. NOTE: This sheet is a summary. It may not cover all possible information. If you have questions about this medicine, talk to your doctor, pharmacist, or health care provider.  2018 Elsevier/Gold Standard (2015-11-25 13:41:35)

## 2018-04-25 NOTE — Progress Notes (Signed)
   Subjective:    Patient ID: Rebecca Andrade, female    DOB: 07/06/1971, 47 y.o.   MRN: [id number]  HPI Chief Complaint  Patient presents with  . Follow-up    bites are worsening, itching and painful.    She is here due to worsening itching associated with presumed insect bites, potentially bed bug bites. Bites are on her left upper arm, back.  Denies fever, chills, sore throat, difficulty swallowing or cough/congestion. No N/V/D.   Using topical steroid and taking Benadryl. States she feels like rash is spreading. She has washed all of her clothing and linens and is no longer sleeping at her friend's house in case there are bed bugs.   Reviewed allergies, medications, past medical, surgical, family, and social history.    Review of Systems Pertinent positives and negatives in the history of present illness.     Objective:   Physical Exam BP 136/80   Pulse 80   Temp 98.5 F (36.9 C)   Resp 16   Ht 5\' 8"  (1.727 m)   Wt 199 lb 12.8 oz (90.6 kg)   BMI 30.38 kg/m   Alert and in no distress. Pharyngeal area is normal.  Cardiac exam shows a regular rhythm without murmurs or gallops. Lungs are clear to auscultation. Multiple red raised bumps on her left posterior arm and back, today the bumps appear to be in more of a linear fashion than yesterday and increased redness. No sign of infection.       Assessment & Plan:  Rash and nonspecific skin eruption - Plan: predniSONE (STERAPRED UNI-PAK 21 TAB) 10 MG (21) TBPK tablet  Reaction to unknown insect bite is slightly worse but no sign of infection.  Will put her on oral steroids. Discussed how to take these and possible side effects.  Return if symptoms continue to worsen or if signs of infection appear.

## 2018-04-29 ENCOUNTER — Other Ambulatory Visit: Payer: Self-pay

## 2018-04-29 ENCOUNTER — Telehealth: Payer: Self-pay | Admitting: Family Medicine

## 2018-04-29 DIAGNOSIS — R21 Rash and other nonspecific skin eruption: Secondary | ICD-10-CM

## 2018-04-29 MED ORDER — TRIAMCINOLONE ACETONIDE 0.1 % EX CREA: 1.0000 "application " | TOPICAL_CREAM | Freq: Two times a day (BID) | CUTANEOUS | 0 refills | Status: DC

## 2018-04-29 NOTE — Telephone Encounter (Signed)
Ok to refill the triamcinolone cream but no more oral steroids. These will keep working in her system. As long as she is improving then this is ok. Please let her know.

## 2018-04-29 NOTE — Telephone Encounter (Signed)
Pt called and is requesting a refill on her kenalog cream states the spots are not comptley gone, and they are still itching  states she has one more day of the steroids and is wondering if she needs another round of them pt uses [id number] - [place], [place] - [address] and pt can be reached at [phone]

## 2018-04-29 NOTE — Telephone Encounter (Signed)
Refill on Kenalog has been sent to pharmacy. Pt has been informed of providers note.

## 2018-05-06 ENCOUNTER — Telehealth: Payer: Self-pay | Admitting: Family Medicine

## 2018-05-06 ENCOUNTER — Other Ambulatory Visit: Payer: Self-pay | Admitting: Family Medicine

## 2018-05-06 MED ORDER — LINACLOTIDE 290 MCG PO CAPS: ORAL_CAPSULE | ORAL | 1 refills | Status: DC

## 2018-05-06 NOTE — Telephone Encounter (Signed)
done

## 2018-05-06 NOTE — Telephone Encounter (Signed)
  Patient called back, it is [facility] [address]

## 2018-05-06 NOTE — Telephone Encounter (Signed)
Ok to refill Linzess. Thanks.

## 2018-05-06 NOTE — Telephone Encounter (Signed)
Called pt to find out which pharmacy med needs to get sent to. Got a refill today already from [facility]

## 2018-05-06 NOTE — Telephone Encounter (Signed)
Patient req refill Linzess to [facility] [address]  Pt sched cpe July 31

## 2018-05-08 ENCOUNTER — Ambulatory Visit: Payer: Medicare HMO | Admitting: Neurology

## 2018-05-08 ENCOUNTER — Other Ambulatory Visit: Payer: Self-pay

## 2018-05-08 ENCOUNTER — Encounter: Payer: Self-pay | Admitting: Neurology

## 2018-05-08 VITALS — BP 119/80 | HR 89 | Ht 68.0 in | Wt 204.0 lb

## 2018-05-08 DIAGNOSIS — G43019 Migraine without aura, intractable, without status migrainosus: Secondary | ICD-10-CM | POA: Diagnosis not present

## 2018-05-08 DIAGNOSIS — R42 Dizziness and giddiness: Secondary | ICD-10-CM

## 2018-05-08 MED ORDER — MECLIZINE HCL 25 MG PO TABS: ORAL_TABLET | ORAL | 3 refills | Status: DC

## 2018-05-08 MED ORDER — KETOROLAC TROMETHAMINE 10 MG PO TABS: 10.0000 mg | ORAL_TABLET | Freq: Four times a day (QID) | ORAL | 1 refills | Status: DC | PRN

## 2018-05-08 MED ORDER — PROMETHAZINE HCL 50 MG PO TABS: 50.0000 mg | ORAL_TABLET | Freq: Three times a day (TID) | ORAL | 3 refills | Status: DC | PRN

## 2018-05-08 MED ORDER — VENLAFAXINE HCL ER 37.5 MG PO CP24: ORAL_CAPSULE | ORAL | 3 refills | Status: DC

## 2018-05-08 NOTE — Patient Instructions (Signed)
We will go on Effexor for the headache and the body aches, call for any dose adjustments.
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
## 2018-05-13 ENCOUNTER — Other Ambulatory Visit (HOSPITAL_COMMUNITY): Payer: Self-pay | Admitting: Psychiatry

## 2018-05-13 DIAGNOSIS — F332 Major depressive disorder, recurrent severe without psychotic features: Secondary | ICD-10-CM

## 2018-05-14 ENCOUNTER — Telehealth: Payer: Self-pay | Admitting: Neurology

## 2018-05-14 MED ORDER — VENLAFAXINE HCL ER 37.5 MG PO CP24: 37.5000 mg | ORAL_CAPSULE | Freq: Every day | ORAL | 0 refills | Status: DC

## 2018-05-14 NOTE — Telephone Encounter (Addendum)
I called pharmacy and spoke with Misty StanleyLisa. Advised we sent in new rx for venlafaxine. She will look out for it. They dispensed qty 20 toradol to pt.   I called pt back and relayed updated information. Advised she is limited to qty 20 per month for toradol and to use sparingly. She verbalized understanding and appreciation.

## 2018-05-14 NOTE — Telephone Encounter (Signed)
Called and spoke with patient's pharmacy.   Insurance will only cover venlafaxine 37.5 mg once daily, not twice daily.  For the toradol, they will only cover 5 day supply qty 20.

## 2018-05-14 NOTE — Addendum Note (Signed)
Addended by: Eilene GhaziKING, Kihanna Kamiya L on: 05/14/2018 11:50 AM   Modules accepted: Orders

## 2018-05-14 NOTE — Telephone Encounter (Signed)
Change rx to venlafaxine 37.5mg  daily (# 30). Patient may take 37.mg daily x 2 weeks; if tolerating, then patient may finish the rx using twice a day dosing. Then we can send 75mg  strength.   Ok to change toradol to 5 day supply, qty 20.   -VRP

## 2018-05-14 NOTE — Telephone Encounter (Signed)
Rx venlafaxine for 37.5 mg qd printed. Waiting on MD signature. Will fax once signed

## 2018-05-14 NOTE — Telephone Encounter (Signed)
Faxed rx venlafaxine to [phone]. Received fax confirmation.

## 2018-05-14 NOTE — Telephone Encounter (Signed)
Patient saw Dr. Anne HahnWillis last Wednesday and Rx's were called to [facility] on [address]. Patient states pharmacy did not receive Rx's for  venlafaxine XR (EFFEXOR XR) 37.5 MG 24 hr capsule and ketorolac (TORADOL) 10 MG tablet. I advised Dr. Anne HahnWillis is out of the office but will send message to his nurse to resend.

## 2018-06-03 ENCOUNTER — Other Ambulatory Visit (HOSPITAL_COMMUNITY): Payer: Self-pay | Admitting: Psychiatry

## 2018-06-03 DIAGNOSIS — F332 Major depressive disorder, recurrent severe without psychotic features: Secondary | ICD-10-CM

## 2018-06-04 NOTE — Progress Notes (Signed)
Rebecca Andrade is a 47 y.o. female who presents for annual wellness visit, CPE and follow-up on chronic medical conditions.  Rebecca Andrade has the following concerns:  Complains of abdominal bloating. Intermittent. No N/V/D or changes in bowel habits. Linzess is helping with constipation. Colonoscopy up to date.  Irregular menses for years, no changes.  Declines STD testing. Not currently sexually active.  Bruising easily for several months.   Post nasal drainage. Has been on Claritin in the past. States Rebecca Andrade pulmonologist told Rebecca Andrade Rebecca Andrade persistent cough was not related to Rebecca Andrade lungs.   Medicare since 2006.    Rebecca Andrade is under the care of Rebecca Andrade neurologist, Dr. Anne Hahn, for migraines and vertigo. Recently started Rebecca Andrade on a new medication, Effexor.  Rebecca Andrade psychiatrist is managing Rebecca Andrade ADHD and depression.    Immunization History  Administered Date(s) Administered  . Influenza, Seasonal, Injecte, Preservative Fre 06/06/2016   Last Pap smear: 1 year ago. Normal per patient. Gardiner Rhyme at [facility].  Last mammogram: 10/05/2017 and normal  Last colonoscopy: 08/2014. Recall due in 2025  Last DEXA: never  Dentist: [facility] for gum disease and getting a partial.  Ophtho: 2 years ago  Diet: does not eat sweets. Eats a lot of carbohydrates.  Exercise: walks Rebecca Andrade dog daily   Periods: last month and becoming irregular.   Other doctors caring for patient include: Dr. Lolly Mustache- psychiatrist Dr. Anne Hahn- neurologist [facility] Dr. Delton Coombes- pulmonologist  Dr. Harriette Bouillon - eye doctor [facility]    Depression screen:  See questionnaire below.  Depression screen [facility] 2/9 06/05/2018 05/21/2017  Decreased Interest 0 1  Down, Depressed, Hopeless 0 1  PHQ - 2 Score 0 2  Some encounter information is confidential and restricted. Go to Review Flowsheets activity to see all data.    Fall Risk Screen: see questionnaire below. Fall Risk  06/05/2018 05/21/2017  Falls in the past year? No No    ADL screen:   See questionnaire below Functional Status Survey: Is the patient deaf or have difficulty hearing?: No Does the patient have difficulty seeing, even when wearing glasses/contacts?: No Does the patient have difficulty concentrating, remembering, or making decisions?: Yes Does the patient have difficulty walking or climbing stairs?: No Does the patient have difficulty dressing or bathing?: No Does the patient have difficulty doing errands alone such as visiting a doctor's office or shopping?: Yes(gets vertigo alot)   End of Life Discussion:  Patient does not have a living will and medical power of attorney. Rebecca Andrade son Rebecca Andrade. Is Rebecca Andrade next of kin. Forms given to patient and explained. Rebecca Andrade will take them home.   Review of Systems Constitutional: -fever, -chills, -sweats, -unexpected weight change, -anorexia, -fatigue Allergy: -sneezing, -itching, -congestion Dermatology: denies changing moles, rash, lumps, new worrisome lesions ENT: -runny nose, -ear pain, -sore throat, -hoarseness, -sinus pain, -teeth pain, -tinnitus, -hearing loss, -epistaxis Cardiology:  -chest pain, -palpitations, -edema, -orthopnea, -paroxysmal nocturnal dyspnea Respiratory: -cough, -shortness of breath, -dyspnea on exertion, -wheezing, -hemoptysis Gastroenterology: -abdominal pain, -nausea, -vomiting, -diarrhea, -constipation, -blood in stool, -changes in bowel movement, -dysphagia Hematology: -bleeding, reports bruising problems Musculoskeletal: -arthralgias, -myalgias, -joint swelling, -back pain, -neck pain, -cramping, -gait changes Ophthalmology: -vision changes, -eye redness, -itching, -discharge Urology: -dysuria, -difficulty urinating, -hematuria, -urinary frequency, -urgency, incontinence Neurology: -headache, -weakness, -tingling, -numbness, -speech abnormality, -memory loss, -falls, -dizziness Psychology:  -depressed mood, -agitation, -sleep problems    PHYSICAL EXAM:  BP 120/70   Pulse 78   Ht 5'  7.25" (1.708 m)  Wt 196 lb 12.8 oz (89.3 kg)   BMI 30.59 kg/m   General Appearance: Alert, cooperative, no distress, appears stated age Head: Normocephalic, without obvious abnormality, atraumatic Eyes: PERRL, conjunctiva/corneas clear, EOM's intact, fundi benign Ears: Normal TM's and external ear canals Nose: Nares normal, mucosa normal, no drainage or sinus   tenderness Throat: Lips, mucosa, and tongue normal; teeth and gums normal Neck: Supple, no lymphadenopathy; thyroid: no enlargement/tenderness/nodules; no carotid bruit or JVD Back: Spine nontender, no curvature, ROM normal, no CVA tenderness Lungs: Clear to auscultation bilaterally without wheezes, rales or ronchi; respirations unlabored Chest Wall: No tenderness or deformity Heart: Regular rate and rhythm, S1 and S2 normal, no murmur, rub or gallop Breast Exam: No tenderness, masses, or nipple discharge or inversion. No axillary lymphadenopathy Abdomen: Soft, non-tender, nondistended, normoactive bowel sounds, no masses, no hepatosplenomegaly Genitalia: Normal external genitalia without lesions.  BUS and vagina normal; cervix without lesions, or cervical motion tenderness. No abnormal vaginal discharge.  Uterus and adnexa not enlarged, nontender, no masses.  Pap performed Rectal: Normal tone, no masses or tenderness; guaiac negative stool Extremities: No clubbing, cyanosis or edema Pulses: 2+ and symmetric all extremities Skin: Skin color, texture, turgor normal, no rashes or lesions Lymph nodes: Cervical, supraclavicular, and axillary nodes normal Neurologic: CNII-XII intact, normal strength, sensation and gait; reflexes 2+ and symmetric throughout Psych: Normal mood, affect, hygiene and grooming.  ASSESSMENT/PLAN: Medicare annual wellness visit, subsequent  Chronic constipation  Routine general medical examination at a health care facility - Plan: Lipid panel, CBC with Differential/Platelet, Comprehensive metabolic  panel  Irregular menses  Abdominal bloating - Plan: Comprehensive metabolic panel  Post-nasal drainage  Hematuria, unspecified type - Plan: Urine Microscopic  History of kidney stones  Screening for lipid disorders  Vaccine counseling  Advance directive discussed with patient  Appears to be doing well physically. Rebecca Andrade opinion of Rebecca Andrade health is somewhat poor.  Rebecca Andrade will continue seeing Rebecca Andrade neurologist and psychiatrist.  Mood is stable.  Recommend cutting back on gas producing foods for abdominal bloating. Abdominal exam benign.  Continue on Linzess for constipation. This appears to be effective.  UA + blood. Asymptomatic. Will send for urine microscopy. Recently passes kidney stone.   Tdap prescription given and Rebecca Andrade is aware that Rebecca Andrade should take this to Rebecca Andrade pharmacy.  Advance directives discussed with patient and forms given. MOST form filled out with Rebecca Andrade and will be scanned.  Claritin for post nasal drainage and let me know if symptoms do not improve. Consider ENT referral if not helping.   Attempt to get Rebecca Andrade pap smear report.  Mammogram up to date.  Colonoscopy up to date.   Follow up pending labs or if post nasal drainage is not improving.   Discussed monthly self breast exams and yearly mammograms; at least 30 minutes of aerobic activity at least 5 days/week and weight-bearing exercise 2x/week; proper sunscreen use reviewed; healthy diet, including goals of calcium and vitamin D intake and alcohol recommendations (less than or equal to 1 drink/day) reviewed; regular seatbelt use; changing batteries in smoke detectors.  Immunization recommendations discussed.  Colonoscopy recommendations reviewed   Medicare Attestation I have personally reviewed: The patient's medical and social history Their use of alcohol, tobacco or illicit drugs Their current medications and supplements The patient's functional ability including ADLs,fall risks, home safety risks, cognitive, and hearing  and visual impairment Diet and physical activities Evidence for depression or mood disorders  The patient's weight, height, and BMI have been recorded in the chart.  I have made referrals, counseling, and provided education to the patient based on review of the above and I have provided the patient with a written personalized care plan for preventive services.     Hetty Blend, NP-C   06/05/2018

## 2018-06-05 ENCOUNTER — Ambulatory Visit: Payer: Medicare HMO | Admitting: Family Medicine

## 2018-06-05 ENCOUNTER — Encounter: Payer: Self-pay | Admitting: Family Medicine

## 2018-06-05 VITALS — BP 120/70 | HR 78 | Ht 67.25 in | Wt 196.8 lb

## 2018-06-05 DIAGNOSIS — N926 Irregular menstruation, unspecified: Secondary | ICD-10-CM | POA: Diagnosis not present

## 2018-06-05 DIAGNOSIS — R140 Abdominal distension (gaseous): Secondary | ICD-10-CM | POA: Diagnosis not present

## 2018-06-05 DIAGNOSIS — R319 Hematuria, unspecified: Secondary | ICD-10-CM | POA: Diagnosis not present

## 2018-06-05 DIAGNOSIS — K5909 Other constipation: Secondary | ICD-10-CM | POA: Diagnosis not present

## 2018-06-05 DIAGNOSIS — R0982 Postnasal drip: Secondary | ICD-10-CM | POA: Diagnosis not present

## 2018-06-05 DIAGNOSIS — Z7189 Other specified counseling: Secondary | ICD-10-CM | POA: Diagnosis not present

## 2018-06-05 DIAGNOSIS — Z0000 Encounter for general adult medical examination without abnormal findings: Secondary | ICD-10-CM

## 2018-06-05 DIAGNOSIS — Z87442 Personal history of urinary calculi: Secondary | ICD-10-CM | POA: Diagnosis not present

## 2018-06-05 DIAGNOSIS — Z13220 Encounter for screening for lipoid disorders: Secondary | ICD-10-CM

## 2018-06-05 DIAGNOSIS — Z136 Encounter for screening for cardiovascular disorders: Secondary | ICD-10-CM | POA: Diagnosis not present

## 2018-06-05 DIAGNOSIS — Z7185 Encounter for immunization safety counseling: Secondary | ICD-10-CM

## 2018-06-05 LAB — POCT URINALYSIS DIP (PROADVANTAGE DEVICE)
BILIRUBIN UA: NEGATIVE mg/dL
Bilirubin, UA: NEGATIVE
Glucose, UA: NEGATIVE mg/dL
NITRITE UA: NEGATIVE
PH UA: 6 (ref 5.0–8.0)
Specific Gravity, Urine: 1.03
Urobilinogen, Ur: NEGATIVE

## 2018-06-05 NOTE — Patient Instructions (Addendum)
Start back on Claritin for post nasal drainage and throat clearing and see if this helps.   Take the prescription for Tdap to your pharmacy to have this done.   Fill out the advance directives if you wish and return these.   Continue on your current medication regimen and seeing your specialists.   We will call you with your results.

## 2018-06-05 NOTE — Addendum Note (Signed)
Addended by: Herminio CommonsJOHNSON, SABRINA A on: 06/05/2018 10:13 AM   Modules accepted: Orders

## 2018-06-06 ENCOUNTER — Other Ambulatory Visit: Payer: Self-pay | Admitting: Family Medicine

## 2018-06-06 LAB — CBC WITH DIFFERENTIAL/PLATELET
BASOS ABS: 0.1 10*3/uL (ref 0.0–0.2)
Basos: 1 %
EOS (ABSOLUTE): 0.2 10*3/uL (ref 0.0–0.4)
Eos: 2 %
Hematocrit: 46 % (ref 34.0–46.6)
Hemoglobin: 15.1 g/dL (ref 11.1–15.9)
IMMATURE GRANS (ABS): 0 10*3/uL (ref 0.0–0.1)
Immature Granulocytes: 0 %
LYMPHS ABS: 2.5 10*3/uL (ref 0.7–3.1)
LYMPHS: 27 %
MCH: 30.4 pg (ref 26.6–33.0)
MCHC: 32.8 g/dL (ref 31.5–35.7)
MCV: 93 fL (ref 79–97)
MONOCYTES: 8 %
Monocytes Absolute: 0.7 10*3/uL (ref 0.1–0.9)
NEUTROS PCT: 62 %
Neutrophils Absolute: 5.8 10*3/uL (ref 1.4–7.0)
PLATELETS: 322 10*3/uL (ref 150–450)
RBC: 4.97 x10E6/uL (ref 3.77–5.28)
RDW: 12.2 % — ABNORMAL LOW (ref 12.3–15.4)
WBC: 9.4 10*3/uL (ref 3.4–10.8)

## 2018-06-06 LAB — COMPREHENSIVE METABOLIC PANEL
A/G RATIO: 1.6 (ref 1.2–2.2)
ALT: 11 IU/L (ref 0–32)
AST: 15 IU/L (ref 0–40)
Albumin: 4.5 g/dL (ref 3.5–5.5)
Alkaline Phosphatase: 113 IU/L (ref 39–117)
BILIRUBIN TOTAL: 0.4 mg/dL (ref 0.0–1.2)
BUN / CREAT RATIO: 15 (ref 9–23)
BUN: 14 mg/dL (ref 6–24)
CHLORIDE: 103 mmol/L (ref 96–106)
CO2: 20 mmol/L (ref 20–29)
Calcium: 9.4 mg/dL (ref 8.7–10.2)
Creatinine, Ser: 0.96 mg/dL (ref 0.57–1.00)
GFR calc non Af Amer: 71 mL/min/{1.73_m2} (ref >59)
GFR, EST AFRICAN AMERICAN: 81 mL/min/{1.73_m2} (ref >59)
Globulin, Total: 2.9 g/dL (ref 1.5–4.5)
Glucose: 102 mg/dL — ABNORMAL HIGH (ref 65–99)
POTASSIUM: 4.1 mmol/L (ref 3.5–5.2)
Sodium: 142 mmol/L (ref 134–144)
TOTAL PROTEIN: 7.4 g/dL (ref 6.0–8.5)

## 2018-06-06 LAB — LIPID PANEL
CHOLESTEROL TOTAL: 222 mg/dL — AB (ref 100–199)
Chol/HDL Ratio: 3 ratio (ref 0.0–4.4)
HDL: 73 mg/dL (ref >39)
LDL Calculated: 129 mg/dL — ABNORMAL HIGH (ref 0–99)
Triglycerides: 100 mg/dL (ref 0–149)
VLDL Cholesterol Cal: 20 mg/dL (ref 5–40)

## 2018-06-06 LAB — URINALYSIS, MICROSCOPIC ONLY
BACTERIA UA: NONE SEEN
Casts: NONE SEEN /lpf
Epithelial Cells (non renal): >10 /hpf — AB (ref 0–10)

## 2018-06-06 MED ORDER — SULFAMETHOXAZOLE-TRIMETHOPRIM 800-160 MG PO TABS: 1.0000 | ORAL_TABLET | Freq: Two times a day (BID) | ORAL | 0 refills | Status: DC

## 2018-06-10 ENCOUNTER — Other Ambulatory Visit (HOSPITAL_COMMUNITY): Payer: Self-pay

## 2018-06-10 DIAGNOSIS — F332 Major depressive disorder, recurrent severe without psychotic features: Secondary | ICD-10-CM

## 2018-06-10 MED ORDER — QUETIAPINE FUMARATE 100 MG PO TABS: 100.0000 mg | ORAL_TABLET | Freq: Every day | ORAL | 2 refills | Status: DC

## 2018-06-12 ENCOUNTER — Other Ambulatory Visit: Payer: Self-pay | Admitting: Diagnostic Neuroimaging

## 2018-06-12 ENCOUNTER — Other Ambulatory Visit: Payer: Self-pay | Admitting: Neurology

## 2018-06-12 MED ORDER — VENLAFAXINE HCL ER 75 MG PO CP24: 75.0000 mg | ORAL_CAPSULE | Freq: Every day | ORAL | 3 refills | Status: DC

## 2018-06-12 NOTE — Telephone Encounter (Signed)
Received refill request for Effexor. Noted the phone notes from Dr Marjory LiesPenumalli on 05/14/18 the  patient was to call back and let RN know if she is tolerating it. Also noted her insurance only approved Effexor 37.5 mg once daily, not BID. Spoke with patient to see what exact dose of Effexor she is currently taking and to see if she is tolerating it. She is taking 37.5 mg once daily. She said she is tolerating it well and would like dose to be increased.  This RN advised will send her request to Dr Anne HahnWillis. She verbalized understanding, appreciation of call.

## 2018-06-13 ENCOUNTER — Telehealth: Payer: Self-pay | Admitting: Family Medicine

## 2018-06-13 NOTE — Telephone Encounter (Signed)
[facility] Sending back for review.

## 2018-06-18 ENCOUNTER — Encounter: Payer: Self-pay | Admitting: Internal Medicine

## 2018-06-28 ENCOUNTER — Other Ambulatory Visit (HOSPITAL_COMMUNITY): Payer: Self-pay | Admitting: Psychiatry

## 2018-06-28 ENCOUNTER — Other Ambulatory Visit: Payer: Self-pay | Admitting: Neurology

## 2018-06-28 DIAGNOSIS — F332 Major depressive disorder, recurrent severe without psychotic features: Secondary | ICD-10-CM

## 2018-06-28 MED ORDER — KETOROLAC TROMETHAMINE 10 MG PO TABS: 10.0000 mg | ORAL_TABLET | Freq: Four times a day (QID) | ORAL | 1 refills | Status: DC | PRN

## 2018-07-11 ENCOUNTER — Ambulatory Visit (HOSPITAL_COMMUNITY): Payer: Medicare HMO | Admitting: Psychiatry

## 2018-07-11 ENCOUNTER — Encounter (HOSPITAL_COMMUNITY): Payer: Self-pay | Admitting: Psychiatry

## 2018-07-11 VITALS — BP 128/74 | HR 80 | Ht 67.0 in | Wt 197.6 lb

## 2018-07-11 DIAGNOSIS — Z811 Family history of alcohol abuse and dependence: Secondary | ICD-10-CM | POA: Diagnosis not present

## 2018-07-11 DIAGNOSIS — Z736 Limitation of activities due to disability: Secondary | ICD-10-CM

## 2018-07-11 DIAGNOSIS — Z818 Family history of other mental and behavioral disorders: Secondary | ICD-10-CM | POA: Diagnosis not present

## 2018-07-11 DIAGNOSIS — F900 Attention-deficit hyperactivity disorder, predominantly inattentive type: Secondary | ICD-10-CM | POA: Diagnosis not present

## 2018-07-11 DIAGNOSIS — F332 Major depressive disorder, recurrent severe without psychotic features: Secondary | ICD-10-CM | POA: Diagnosis not present

## 2018-07-11 MED ORDER — BUSPIRONE HCL 30 MG PO TABS: 30.0000 mg | ORAL_TABLET | Freq: Two times a day (BID) | ORAL | 2 refills | Status: DC

## 2018-07-11 MED ORDER — AMPHETAMINE-DEXTROAMPHETAMINE 10 MG PO TABS: ORAL_TABLET | ORAL | 0 refills | Status: DC

## 2018-07-11 MED ORDER — QUETIAPINE FUMARATE 100 MG PO TABS: 100.0000 mg | ORAL_TABLET | Freq: Every day | ORAL | 2 refills | Status: DC

## 2018-07-11 MED ORDER — FLUOXETINE HCL 20 MG PO CAPS: 60.0000 mg | ORAL_CAPSULE | Freq: Every day | ORAL | 2 refills | Status: DC
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
## 2018-07-25 ENCOUNTER — Ambulatory Visit (HOSPITAL_COMMUNITY): Payer: Self-pay | Admitting: Psychiatry

## 2018-08-16 ENCOUNTER — Emergency Department (HOSPITAL_COMMUNITY)
Admission: EM | Admit: 2018-08-16 | Discharge: 2018-08-17 | Disposition: A | Discharge status: Home / Self Care | Payer: Medicare HMO | Attending: Emergency Medicine | Admitting: Emergency Medicine

## 2018-08-16 ENCOUNTER — Other Ambulatory Visit: Payer: Self-pay

## 2018-08-16 ENCOUNTER — Encounter (HOSPITAL_COMMUNITY): Payer: Self-pay

## 2018-08-16 DIAGNOSIS — N132 Hydronephrosis with renal and ureteral calculous obstruction: Secondary | ICD-10-CM | POA: Diagnosis not present

## 2018-08-16 DIAGNOSIS — Z79899 Other long term (current) drug therapy: Secondary | ICD-10-CM | POA: Diagnosis not present

## 2018-08-16 DIAGNOSIS — N200 Calculus of kidney: Secondary | ICD-10-CM

## 2018-08-16 DIAGNOSIS — N189 Chronic kidney disease, unspecified: Secondary | ICD-10-CM | POA: Diagnosis not present

## 2018-08-16 DIAGNOSIS — F419 Anxiety disorder, unspecified: Secondary | ICD-10-CM | POA: Diagnosis not present

## 2018-08-16 DIAGNOSIS — F329 Major depressive disorder, single episode, unspecified: Secondary | ICD-10-CM | POA: Insufficient documentation

## 2018-08-16 DIAGNOSIS — I700 Atherosclerosis of aorta: Secondary | ICD-10-CM | POA: Diagnosis not present

## 2018-08-16 DIAGNOSIS — F988 Other specified behavioral and emotional disorders with onset usually occurring in childhood and adolescence: Secondary | ICD-10-CM | POA: Diagnosis not present

## 2018-08-16 DIAGNOSIS — Z87442 Personal history of urinary calculi: Secondary | ICD-10-CM | POA: Diagnosis not present

## 2018-08-16 DIAGNOSIS — Z6830 Body mass index (BMI) 30.0-30.9, adult: Secondary | ICD-10-CM | POA: Diagnosis not present

## 2018-08-16 DIAGNOSIS — R109 Unspecified abdominal pain: Secondary | ICD-10-CM | POA: Diagnosis not present

## 2018-08-16 DIAGNOSIS — E669 Obesity, unspecified: Secondary | ICD-10-CM | POA: Diagnosis not present

## 2018-08-16 DIAGNOSIS — R1032 Left lower quadrant pain: Secondary | ICD-10-CM | POA: Diagnosis not present

## 2018-08-16 DIAGNOSIS — R112 Nausea with vomiting, unspecified: Secondary | ICD-10-CM | POA: Diagnosis not present

## 2018-08-16 DIAGNOSIS — G8929 Other chronic pain: Secondary | ICD-10-CM | POA: Diagnosis not present

## 2018-08-16 DIAGNOSIS — R310 Gross hematuria: Secondary | ICD-10-CM | POA: Diagnosis not present

## 2018-08-16 LAB — COMPREHENSIVE METABOLIC PANEL
ALBUMIN: 3.8 g/dL (ref 3.5–5.0)
ALT: 13 U/L (ref 0–44)
ANION GAP: 8 (ref 5–15)
AST: 15 U/L (ref 15–41)
Alkaline Phosphatase: 94 U/L (ref 38–126)
BILIRUBIN TOTAL: 0.7 mg/dL (ref 0.3–1.2)
BUN: 10 mg/dL (ref 6–20)
CALCIUM: 9.1 mg/dL (ref 8.9–10.3)
CO2: 25 mmol/L (ref 22–32)
CREATININE: 0.93 mg/dL (ref 0.44–1.00)
Chloride: 106 mmol/L (ref 98–111)
GFR calc Af Amer: >60 mL/min (ref >60)
GFR calc non Af Amer: >60 mL/min (ref >60)
Glucose, Bld: 100 mg/dL — ABNORMAL HIGH (ref 70–99)
Potassium: 3.2 mmol/L — ABNORMAL LOW (ref 3.5–5.1)
SODIUM: 139 mmol/L (ref 135–145)
TOTAL PROTEIN: 7.4 g/dL (ref 6.5–8.1)

## 2018-08-16 LAB — URINALYSIS, ROUTINE W REFLEX MICROSCOPIC
Bilirubin Urine: NEGATIVE
GLUCOSE, UA: NEGATIVE mg/dL
Ketones, ur: NEGATIVE mg/dL
Leukocytes, UA: NEGATIVE
Nitrite: NEGATIVE
PH: 6 (ref 5.0–8.0)
Protein, ur: NEGATIVE mg/dL
Specific Gravity, Urine: 1.004 — ABNORMAL LOW (ref 1.005–1.030)

## 2018-08-16 LAB — CBC
HCT: 45.9 % (ref 36.0–46.0)
HEMOGLOBIN: 14.4 g/dL (ref 12.0–15.0)
MCH: 29.4 pg (ref 26.0–34.0)
MCHC: 31.4 g/dL (ref 30.0–36.0)
MCV: 93.7 fL (ref 80.0–100.0)
NRBC: 0 % (ref 0.0–0.2)
Platelets: 334 10*3/uL (ref 150–400)
RBC: 4.9 MIL/uL (ref 3.87–5.11)
RDW: 11.9 % (ref 11.5–15.5)
WBC: 10.3 10*3/uL (ref 4.0–10.5)

## 2018-08-16 LAB — I-STAT BETA HCG BLOOD, ED (MC, WL, AP ONLY): I-stat hCG, quantitative: <5 m[IU]/mL (ref <5)

## 2018-08-16 LAB — LIPASE, BLOOD: Lipase: 39 U/L (ref 11–51)

## 2018-08-16 MED ORDER — ONDANSETRON HCL 4 MG PO TABS
4.0000 mg | ORAL_TABLET | Freq: Once | ORAL | Status: AC
  Administered 2018-08-16: 4 mg via ORAL
  Filled 2018-08-16: qty 1

## 2018-08-16 MED ORDER — OXYCODONE-ACETAMINOPHEN 5-325 MG PO TABS
1.0000 | ORAL_TABLET | ORAL | Status: DC | PRN
  Administered 2018-08-16: 1 via ORAL
  Filled 2018-08-16: qty 1

## 2018-08-16 NOTE — ED Triage Notes (Signed)
Pt here with left sided flank pain that began about an hour ago.  Have not been able to urinate much over that last 12 hours.  Hx of kidney stones.  States she passed 4 stone about 2 months ago.  A&Ox4.

## 2018-08-16 NOTE — ED Notes (Signed)
Pt states she is now having abdominal pains that go into her upper abdominal region.

## 2018-08-17 ENCOUNTER — Encounter (HOSPITAL_COMMUNITY): Payer: Self-pay | Admitting: Certified Registered Nurse Anesthetist

## 2018-08-17 ENCOUNTER — Emergency Department (HOSPITAL_COMMUNITY): Payer: Medicare HMO | Admitting: Certified Registered Nurse Anesthetist

## 2018-08-17 ENCOUNTER — Emergency Department (HOSPITAL_COMMUNITY): Payer: Medicare HMO

## 2018-08-17 ENCOUNTER — Encounter (HOSPITAL_COMMUNITY)
Admission: EM | Disposition: A | Discharge status: Home / Self Care | Payer: Self-pay | Source: Home / Self Care | Attending: Emergency Medicine

## 2018-08-17 DIAGNOSIS — E669 Obesity, unspecified: Secondary | ICD-10-CM | POA: Diagnosis not present

## 2018-08-17 DIAGNOSIS — N189 Chronic kidney disease, unspecified: Secondary | ICD-10-CM | POA: Diagnosis not present

## 2018-08-17 DIAGNOSIS — N201 Calculus of ureter: Secondary | ICD-10-CM | POA: Diagnosis not present

## 2018-08-17 DIAGNOSIS — R112 Nausea with vomiting, unspecified: Secondary | ICD-10-CM | POA: Diagnosis not present

## 2018-08-17 DIAGNOSIS — F988 Other specified behavioral and emotional disorders with onset usually occurring in childhood and adolescence: Secondary | ICD-10-CM | POA: Diagnosis not present

## 2018-08-17 DIAGNOSIS — Z87442 Personal history of urinary calculi: Secondary | ICD-10-CM | POA: Diagnosis not present

## 2018-08-17 DIAGNOSIS — R1032 Left lower quadrant pain: Secondary | ICD-10-CM | POA: Diagnosis not present

## 2018-08-17 DIAGNOSIS — N200 Calculus of kidney: Secondary | ICD-10-CM | POA: Diagnosis present

## 2018-08-17 DIAGNOSIS — Z79899 Other long term (current) drug therapy: Secondary | ICD-10-CM | POA: Diagnosis not present

## 2018-08-17 DIAGNOSIS — G8929 Other chronic pain: Secondary | ICD-10-CM | POA: Diagnosis not present

## 2018-08-17 DIAGNOSIS — N132 Hydronephrosis with renal and ureteral calculous obstruction: Secondary | ICD-10-CM | POA: Diagnosis not present

## 2018-08-17 DIAGNOSIS — F329 Major depressive disorder, single episode, unspecified: Secondary | ICD-10-CM | POA: Diagnosis not present

## 2018-08-17 DIAGNOSIS — I700 Atherosclerosis of aorta: Secondary | ICD-10-CM | POA: Diagnosis not present

## 2018-08-17 DIAGNOSIS — R310 Gross hematuria: Secondary | ICD-10-CM | POA: Diagnosis not present

## 2018-08-17 DIAGNOSIS — Z6830 Body mass index (BMI) 30.0-30.9, adult: Secondary | ICD-10-CM | POA: Diagnosis not present

## 2018-08-17 DIAGNOSIS — F419 Anxiety disorder, unspecified: Secondary | ICD-10-CM | POA: Diagnosis not present

## 2018-08-17 HISTORY — PX: CYSTOSCOPY WITH STENT PLACEMENT: SHX5790

## 2018-08-17 SURGERY — CYSTOSCOPY, WITH STENT INSERTION
Anesthesia: General | Site: Ureter | Laterality: Left

## 2018-08-17 MED ORDER — PROMETHAZINE HCL 25 MG/ML IJ SOLN: 6.2500 mg | INTRAMUSCULAR | Status: DC | PRN

## 2018-08-17 MED ORDER — FENTANYL CITRATE (PF) 100 MCG/2ML IJ SOLN
75.0000 ug | Freq: Once | INTRAMUSCULAR | Status: AC
  Administered 2018-08-17: 75 ug via INTRAVENOUS
  Filled 2018-08-17: qty 2

## 2018-08-17 MED ORDER — PROPOFOL 500 MG/50ML IV EMUL
INTRAVENOUS | Status: DC | PRN
  Administered 2018-08-17: 100 ug/kg/min via INTRAVENOUS

## 2018-08-17 MED ORDER — MIDAZOLAM HCL 5 MG/5ML IJ SOLN
INTRAMUSCULAR | Status: DC | PRN
  Administered 2018-08-17 (×2): .25 mg via INTRAVENOUS

## 2018-08-17 MED ORDER — CEFAZOLIN SODIUM-DEXTROSE 2-4 GM/100ML-% IV SOLN
2.0000 g | INTRAVENOUS | Status: AC
  Administered 2018-08-17: 2 g via INTRAVENOUS
  Filled 2018-08-17: qty 100

## 2018-08-17 MED ORDER — FENTANYL CITRATE (PF) 100 MCG/2ML IJ SOLN: 25.0000 ug | INTRAMUSCULAR | Status: DC | PRN

## 2018-08-17 MED ORDER — PROPOFOL 10 MG/ML IV BOLUS
INTRAVENOUS | Status: AC
  Filled 2018-08-17: qty 20

## 2018-08-17 MED ORDER — MIDAZOLAM HCL 2 MG/2ML IJ SOLN
INTRAMUSCULAR | Status: AC
  Filled 2018-08-17: qty 2

## 2018-08-17 MED ORDER — OXYCODONE HCL 5 MG PO TABS: 5.0000 mg | ORAL_TABLET | Freq: Once | ORAL | Status: DC | PRN

## 2018-08-17 MED ORDER — PROPOFOL 10 MG/ML IV BOLUS
INTRAVENOUS | Status: AC
  Filled 2018-08-17: qty 60

## 2018-08-17 MED ORDER — ACETAMINOPHEN 10 MG/ML IV SOLN
1000.0000 mg | Freq: Once | INTRAVENOUS | Status: AC
  Administered 2018-08-17: 1000 mg via INTRAVENOUS
  Filled 2018-08-17 (×2): qty 100

## 2018-08-17 MED ORDER — HYDROMORPHONE HCL 1 MG/ML IJ SOLN
1.0000 mg | Freq: Once | INTRAMUSCULAR | Status: AC
  Administered 2018-08-17: 1 mg via INTRAVENOUS
  Filled 2018-08-17: qty 1

## 2018-08-17 MED ORDER — FENTANYL CITRATE (PF) 100 MCG/2ML IJ SOLN
INTRAMUSCULAR | Status: DC | PRN
  Administered 2018-08-17 (×4): 25 ug via INTRAVENOUS

## 2018-08-17 MED ORDER — KETOROLAC TROMETHAMINE 30 MG/ML IJ SOLN
30.0000 mg | Freq: Once | INTRAMUSCULAR | Status: AC
  Administered 2018-08-17: 30 mg via INTRAVENOUS
  Filled 2018-08-17: qty 1

## 2018-08-17 MED ORDER — DEXAMETHASONE SODIUM PHOSPHATE 10 MG/ML IJ SOLN
INTRAMUSCULAR | Status: DC | PRN
  Administered 2018-08-17: 10 mg via INTRAVENOUS

## 2018-08-17 MED ORDER — HYDROCODONE-ACETAMINOPHEN 5-325 MG PO TABS: 1.0000 | ORAL_TABLET | Freq: Four times a day (QID) | ORAL | 0 refills | Status: DC | PRN

## 2018-08-17 MED ORDER — OXYCODONE HCL 5 MG/5ML PO SOLN
5.0000 mg | Freq: Once | ORAL | Status: DC | PRN
  Filled 2018-08-17: qty 5

## 2018-08-17 MED ORDER — IOHEXOL 300 MG/ML  SOLN
INTRAMUSCULAR | Status: DC | PRN
  Administered 2018-08-17: 10 mL via URETHRAL

## 2018-08-17 MED ORDER — SODIUM CHLORIDE 0.9 % IR SOLN
Status: DC | PRN
  Administered 2018-08-17: 3000 mL

## 2018-08-17 MED ORDER — MORPHINE SULFATE (PF) 4 MG/ML IV SOLN
4.0000 mg | Freq: Once | INTRAVENOUS | Status: AC
  Administered 2018-08-17: 4 mg via INTRAVENOUS
  Filled 2018-08-17: qty 1

## 2018-08-17 MED ORDER — FENTANYL CITRATE (PF) 100 MCG/2ML IJ SOLN
INTRAMUSCULAR | Status: AC
  Filled 2018-08-17: qty 2

## 2018-08-17 MED ORDER — PROMETHAZINE HCL 25 MG/ML IJ SOLN
25.0000 mg | Freq: Once | INTRAMUSCULAR | Status: AC
  Administered 2018-08-17: 25 mg via INTRAVENOUS
  Filled 2018-08-17: qty 1

## 2018-08-17 MED ORDER — PROPOFOL 10 MG/ML IV BOLUS
INTRAVENOUS | Status: DC | PRN
  Administered 2018-08-17: 125 mg via INTRAVENOUS

## 2018-08-17 MED ORDER — ONDANSETRON HCL 4 MG/2ML IJ SOLN
INTRAMUSCULAR | Status: DC | PRN
  Administered 2018-08-17: 4 mg via INTRAVENOUS

## 2018-08-17 MED ORDER — LIDOCAINE HCL (CARDIAC) PF 100 MG/5ML IV SOSY
PREFILLED_SYRINGE | INTRAVENOUS | Status: DC | PRN
  Administered 2018-08-17: 50 mg via INTRAVENOUS

## 2018-08-17 MED ORDER — ONDANSETRON HCL 4 MG/2ML IJ SOLN
4.0000 mg | Freq: Once | INTRAMUSCULAR | Status: AC
  Administered 2018-08-17: 4 mg via INTRAVENOUS
  Filled 2018-08-17: qty 2

## 2018-08-17 MED ORDER — LACTATED RINGERS IV SOLN
INTRAVENOUS | Status: DC | PRN
  Administered 2018-08-17 (×2): via INTRAVENOUS

## 2018-08-17 MED ORDER — METOCLOPRAMIDE HCL 5 MG/ML IJ SOLN
INTRAMUSCULAR | Status: DC | PRN
  Administered 2018-08-17: 5 mg via INTRAVENOUS

## 2018-08-17 SURGICAL SUPPLY — 11 items
BAG URO CATCHER STRL LF (MISCELLANEOUS) ×2 IMPLANT
CATH INTERMIT  6FR 70CM (CATHETERS) ×2 IMPLANT
CLOTH BEACON ORANGE TIMEOUT ST (SAFETY) ×2 IMPLANT
COVER WAND RF STERILE (DRAPES) IMPLANT
GLOVE BIOGEL M STRL SZ7.5 (GLOVE) ×2 IMPLANT
GOWN STRL REUS W/TWL LRG LVL3 (GOWN DISPOSABLE) ×4 IMPLANT
GUIDEWIRE STR DUAL SENSOR (WIRE) ×2 IMPLANT
MANIFOLD NEPTUNE II (INSTRUMENTS) ×2 IMPLANT
PACK CYSTO (CUSTOM PROCEDURE TRAY) ×2 IMPLANT
STENT URET 6FRX24 CONTOUR (STENTS) ×1 IMPLANT
TUBING CONNECTING 10 (TUBING) ×2 IMPLANT

## 2018-08-17 NOTE — ED Notes (Signed)
Bladder scan shows 95mL

## 2018-08-17 NOTE — Op Note (Signed)
Preoperative diagnosis:  1. Left UPJ calculus   Postoperative diagnosis:  1. Left UPJ calculus   Procedure:  1. Cystoscopy 2. Left ureteral stent placement (6 x 24 with no string) 3. Left retrograde pyelography with interpretation  Surgeon: Moody Bruins. M.D.  Anesthesia: General  Complications: None  Intraoperative findings: Left retrograde pyelography was performed with omnipaque contrast via a 6 Fr ureteral catheter.  This confirmed proper location of the renal pelvis and proximal portion of the ureteral stent.  EBL: Minimal  Specimens: None  Indication: Rebecca Andrade is a 47 y.o. patient with a large 2 cm left proximal ureteral stone. After reviewing the management options for treatment, she elected to proceed with the above surgical procedure(s). We have discussed the potential benefits and risks of the procedure, side effects of the proposed treatment, the likelihood of the patient achieving the goals of the procedure, and any potential problems that might occur during the procedure or recuperation. Informed consent has been obtained.  Description of procedure:  The patient was taken to the operating room and general anesthesia was induced.  The patient was placed in the dorsal lithotomy position, prepped and draped in the usual sterile fashion, and preoperative antibiotics were administered. A preoperative time-out was performed.   Cystourethroscopy was performed.  The patient's urethra was examined and was normal. The bladder was then systematically examined in its entirety. There was no evidence for any bladder tumors, stones, or other mucosal pathology.    Attention then turned to the left ureteral orifice and a ureteral catheter was used to intubate the ureteral orifice.  Omnipaque contrast was injected through the ureteral catheter and a retrograde pyelogram was performed with findings as dictated above.  A 0.38 sensor guidewire was then advanced up the left  ureter into the renal pelvis under fluoroscopic guidance.  The wire was then backloaded through the cystoscope and a ureteral stent was advance over the wire using Seldinger technique.  The stent was positioned appropriately under fluoroscopic and cystoscopic guidance.  The wire was then removed with an adequate stent curl noted in the renal pelvis as well as in the bladder.  It was noted that the stone had been pushed back into the left renal pelvis.  The bladder was then emptied and the procedure ended.  The patient appeared to tolerate the procedure well and without complications.  The patient was able to be awakened and transferred to the recovery unit in satisfactory condition.    Moody Bruins MD

## 2018-08-17 NOTE — Anesthesia Preprocedure Evaluation (Addendum)
Anesthesia Evaluation  Patient identified by MRN, date of birth, ID band Patient awake    Reviewed: Allergy & Precautions, NPO status , Patient's Chart, lab work & pertinent test results  History of Anesthesia Complications (+) PONV and history of anesthetic complications  Airway Mallampati: III  TM Distance: >3 FB Neck ROM: Full  Mouth opening: Limited Mouth Opening  Dental  (+) Dental Advisory Given, Partial Upper, Missing, Poor Dentition   Pulmonary neg pulmonary ROS,    breath sounds clear to auscultation       Cardiovascular negative cardio ROS   Rhythm:Regular Rate:Normal     Neuro/Psych  Headaches, PSYCHIATRIC DISORDERS Anxiety Depression  Pineal gland cyst Diplopia    GI/Hepatic negative GI ROS, (+)     substance abuse (denies)  cocaine use,   Endo/Other   Obesity   Renal/GU CRFRenal disease Nephrolithiasis   negative genitourinary   Musculoskeletal negative musculoskeletal ROS (+)   Abdominal   Peds  (+) ATTENTION DEFICIT DISORDER WITHOUT HYPERACTIVITY Hematology negative hematology ROS (+)   Anesthesia Other Findings   Reproductive/Obstetrics                            Anesthesia Physical Anesthesia Plan  ASA: II  Anesthesia Plan: General   Post-op Pain Management:    Induction: Intravenous  PONV Risk Score and Plan: 4 or greater and Treatment may vary due to age or medical condition, Ondansetron, Dexamethasone and Midazolam  Airway Management Planned: LMA  Additional Equipment: None  Intra-op Plan:   Post-operative Plan: Extubation in OR  Informed Consent: I have reviewed the patients History and Physical, chart, labs and discussed the procedure including the risks, benefits and alternatives for the proposed anesthesia with the patient or authorized representative who has indicated his/her understanding and acceptance.   Dental advisory given  Plan  Discussed with: CRNA and Anesthesiologist  Anesthesia Plan Comments:        Anesthesia Quick Evaluation

## 2018-08-17 NOTE — Progress Notes (Signed)
Ambulated to Bathroom and voided without difficulty, blood-tinged urine, tolerated activity well, dress now for discharge

## 2018-08-17 NOTE — ED Notes (Signed)
Urology at bedside.

## 2018-08-17 NOTE — ED Notes (Signed)
Consent for procedure signed. Pt transported to [facility] by [facility].

## 2018-08-17 NOTE — Transfer of Care (Signed)
Immediate Anesthesia Transfer of Care Note  Patient: Rebecca Andrade  Procedure(s) Performed: CYSTOSCOPY, LEFT RETROGRADE, WITH LEFT URETERAL STENT PLACEMENT (Left Ureter)  Patient Location: PACU  Anesthesia Type:General  Level of Consciousness: awake, oriented, drowsy, patient cooperative and lethargic  Airway & Oxygen Therapy: Patient Spontanous Breathing and Patient connected to nasal cannula oxygen  Post-op Assessment: Report given to RN, Post -op Vital signs reviewed and stable and Patient moving all extremities  Post vital signs: Reviewed and stable  Last Vitals:  Vitals Value Taken Time  BP 127/80 08/17/2018  9:45 AM  Temp    Pulse 63 08/17/2018  9:49 AM  Resp 9 08/17/2018  9:49 AM  SpO2 98 % 08/17/2018  9:49 AM  Vitals shown include unvalidated device data.  Last Pain:  Vitals:   08/17/18 0727  TempSrc:   PainSc: 10-Worst pain ever         Complications: No apparent anesthesia complications

## 2018-08-17 NOTE — Progress Notes (Signed)
Pt arrived into Pre-Op. Pt alert and oriented, appears uncomfortable. NPO status per pt: last PO fluids/ solids was yesterday at 2130hrs. 20g IV, saline lock noted in Lt AC, WNL. Female PACU RN assisting pt with utilizing CHG baths and undressing. SCD's applied.
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
## 2018-08-17 NOTE — ED Notes (Signed)
[facility] called for transport to [facility] PACU

## 2018-08-17 NOTE — Anesthesia Postprocedure Evaluation (Signed)
Anesthesia Post Note  Patient: Rebecca Andrade  Procedure(s) Performed: CYSTOSCOPY, LEFT RETROGRADE, WITH LEFT URETERAL STENT PLACEMENT (Left Ureter)     Patient location during evaluation: PACU Anesthesia Type: General Level of consciousness: awake and alert Pain management: pain level controlled Vital Signs Assessment: post-procedure vital signs reviewed and stable Respiratory status: spontaneous breathing, nonlabored ventilation, respiratory function stable and patient connected to nasal cannula oxygen Cardiovascular status: blood pressure returned to baseline and stable Postop Assessment: no apparent nausea or vomiting Anesthetic complications: no    Last Vitals:  Vitals:   08/17/18 1030 08/17/18 1037  BP: 115/82 118/81  Pulse: 65 76  Resp: 14 (!) 24  Temp:  (!) 36.4 C  SpO2: 96% 99%    Last Pain:  Vitals:   08/17/18 1045  TempSrc:   PainSc: 0-No pain                 Beryle Lathe

## 2018-08-17 NOTE — Progress Notes (Signed)
Sitting up drinking coke with ice and tolerating well. Partial denture replaced by patient

## 2018-08-17 NOTE — Discharge Instructions (Signed)

## 2018-08-17 NOTE — Anesthesia Procedure Notes (Signed)
Procedure Name: LMA Insertion Date/Time: 08/17/2018 9:11 AM Performed by: Beryle Lathe, MD Pre-anesthesia Checklist: Patient identified, Emergency Drugs available, Suction available, Patient being monitored and Timeout performed Patient Re-evaluated:Patient Re-evaluated prior to induction Oxygen Delivery Method: Circle system utilized Preoxygenation: Pre-oxygenation with 100% oxygen Induction Type: IV induction LMA: LMA with gastric port inserted LMA Size: 3.0 Number of attempts: 1 Placement Confirmation: positive ETCO2 Tube secured with: Tape Dental Injury: Teeth and Oropharynx as per pre-operative assessment
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
## 2018-08-23 ENCOUNTER — Other Ambulatory Visit (HOSPITAL_COMMUNITY): Payer: Self-pay | Admitting: Psychiatry

## 2018-08-23 DIAGNOSIS — F900 Attention-deficit hyperactivity disorder, predominantly inattentive type: Secondary | ICD-10-CM

## 2018-08-23 DIAGNOSIS — N200 Calculus of kidney: Secondary | ICD-10-CM | POA: Diagnosis not present

## 2018-08-23 MED ORDER — AMPHETAMINE-DEXTROAMPHETAMINE 10 MG PO TABS: ORAL_TABLET | ORAL | 0 refills | Status: DC

## 2018-08-26 ENCOUNTER — Other Ambulatory Visit: Payer: Self-pay | Admitting: Urology

## 2018-08-26 ENCOUNTER — Other Ambulatory Visit: Payer: Self-pay | Admitting: Family Medicine

## 2018-08-26 DIAGNOSIS — N200 Calculus of kidney: Secondary | ICD-10-CM

## 2018-08-26 DIAGNOSIS — Z1231 Encounter for screening mammogram for malignant neoplasm of breast: Secondary | ICD-10-CM

## 2018-08-29 ENCOUNTER — Encounter (HOSPITAL_COMMUNITY): Payer: Self-pay

## 2018-08-29 NOTE — Patient Instructions (Signed)
Your procedure is scheduled on: Friday, Nov. 8, 2019   Surgery Time:  12:30PM-2:30PM   Report to [facility] Main  Entrance    Report to admitting at 8:00 AM   Call this number if you have problems the morning of surgery [phone]   Do not eat food or drink liquids :After Midnight.   Brush your teeth the morning of surgery.   Do NOT smoke after Midnight   Take these medicines the morning of surgery with A SIP OF WATER: Buspirone, Fluoxetine                               You may not have any metal on your body including hair pins, jewelry, and body piercings             Do not wear make-up, lotions, powders, perfumes/cologne, or deodorant             Do not wear nail polish.  Do not shave  48 hours prior to surgery.                Do not bring valuables to the hospital. [facility] IS NOT             RESPONSIBLE   FOR VALUABLES.   Contacts, dentures or bridgework may not be worn into surgery.   Leave suitcase in the car. After surgery it may be brought to your room.    Special Instructions: Bring a copy of your healthcare power of attorney and living will documents         the day of surgery if you haven't scanned them in before.              Please read over the following fact sheets you were given:  [facility] - Preparing for Surgery Before surgery, you can play an important role.  Because skin is not sterile, your skin needs to be as free of germs as possible.  You can reduce the number of germs on your skin by washing with CHG (chlorahexidine gluconate) soap before surgery.  CHG is an antiseptic cleaner which kills germs and bonds with the skin to continue killing germs even after washing. Please DO NOT use if you have an allergy to CHG or antibacterial soaps.  If your skin becomes reddened/irritated stop using the CHG and inform your nurse when you arrive at Short Stay. Do not shave (including legs and underarms) for at least 48 hours prior to the first CHG  shower.  You may shave your face/neck.  Please follow these instructions carefully:  1.  Shower with CHG Soap the night before surgery and the  morning of surgery.  2.  If you choose to wash your hair, wash your hair first as usual with your normal  shampoo.  3.  After you shampoo, rinse your hair and body thoroughly to remove the shampoo.                             4.  Use CHG as you would any other liquid soap.  You can apply chg directly to the skin and wash.  Gently with a scrungie or clean washcloth.  5.  Apply the CHG Soap to your body ONLY FROM THE NECK DOWN.   Do   not use on face/ open  Wound or open sores. Avoid contact with eyes, ears mouth and   genitals (private parts).                       Wash face,  Genitals (private parts) with your normal soap.             6.  Wash thoroughly, paying special attention to the area where your    surgery  will be performed.  7.  Thoroughly rinse your body with warm water from the neck down.  8.  DO NOT shower/wash with your normal soap after using and rinsing off the CHG Soap.                9.  Pat yourself dry with a clean towel.            10.  Wear clean pajamas.            11.  Place clean sheets on your bed the night of your first shower and do not  sleep with pets. Day of Surgery : Do not apply any lotions/deodorants the morning of surgery.  Please wear clean clothes to the hospital/surgery center.  FAILURE TO FOLLOW THESE INSTRUCTIONS MAY RESULT IN THE CANCELLATION OF YOUR SURGERY  PATIENT SIGNATURE_________________________________  NURSE SIGNATURE__________________________________  ________________________________________________________________________

## 2018-08-30 ENCOUNTER — Encounter (HOSPITAL_COMMUNITY)
Admission: RE | Admit: 2018-08-30 | Discharge: 2018-08-30 | Disposition: A | Discharge status: Home / Self Care | Payer: Medicare HMO | Source: Ambulatory Visit | Attending: Urology | Admitting: Urology

## 2018-08-30 ENCOUNTER — Encounter (HOSPITAL_COMMUNITY): Payer: Self-pay

## 2018-08-30 ENCOUNTER — Other Ambulatory Visit: Payer: Self-pay

## 2018-08-30 DIAGNOSIS — Z01812 Encounter for preprocedural laboratory examination: Secondary | ICD-10-CM | POA: Insufficient documentation

## 2018-08-30 HISTORY — DX: Personal history of urinary calculi: Z87.442

## 2018-08-30 HISTORY — DX: Atherosclerosis of aorta: I70.0

## 2018-08-30 HISTORY — DX: Personal history of other diseases of the circulatory system: Z86.79

## 2018-08-30 HISTORY — DX: Other fracture of unspecified lower leg, initial encounter for closed fracture: S82.899A

## 2018-08-30 LAB — CBC
HCT: 43.7 % (ref 36.0–46.0)
HEMOGLOBIN: 14.2 g/dL (ref 12.0–15.0)
MCH: 30.3 pg (ref 26.0–34.0)
MCHC: 32.5 g/dL (ref 30.0–36.0)
MCV: 93.4 fL (ref 80.0–100.0)
NRBC: 0 % (ref 0.0–0.2)
PLATELETS: 337 10*3/uL (ref 150–400)
RBC: 4.68 MIL/uL (ref 3.87–5.11)
RDW: 12 % (ref 11.5–15.5)
WBC: 9.7 10*3/uL (ref 4.0–10.5)

## 2018-09-12 ENCOUNTER — Other Ambulatory Visit: Payer: Self-pay | Admitting: Physician Assistant

## 2018-09-13 ENCOUNTER — Encounter (HOSPITAL_COMMUNITY)
Admission: RE | Disposition: A | Discharge status: Home / Self Care | Payer: Self-pay | Source: Other Acute Inpatient Hospital | Attending: Urology

## 2018-09-13 ENCOUNTER — Ambulatory Visit (HOSPITAL_COMMUNITY)
Admission: RE | Admit: 2018-09-13 | Discharge: 2018-09-15 | Disposition: A | Discharge status: Home / Self Care | Payer: Medicare HMO | Source: Other Acute Inpatient Hospital | Attending: Urology | Admitting: Urology

## 2018-09-13 ENCOUNTER — Encounter (HOSPITAL_COMMUNITY): Payer: Self-pay | Admitting: General Practice

## 2018-09-13 ENCOUNTER — Ambulatory Visit (HOSPITAL_COMMUNITY)
Admission: RE | Admit: 2018-09-13 | Discharge: 2018-09-13 | Disposition: A | Discharge status: Home / Self Care | Payer: Medicare HMO | Source: Ambulatory Visit | Attending: Urology | Admitting: Urology

## 2018-09-13 ENCOUNTER — Ambulatory Visit (HOSPITAL_COMMUNITY): Payer: Medicare HMO | Admitting: Anesthesiology

## 2018-09-13 ENCOUNTER — Ambulatory Visit (HOSPITAL_COMMUNITY): Payer: Medicare HMO

## 2018-09-13 ENCOUNTER — Other Ambulatory Visit: Payer: Self-pay

## 2018-09-13 DIAGNOSIS — N200 Calculus of kidney: Secondary | ICD-10-CM

## 2018-09-13 DIAGNOSIS — N189 Chronic kidney disease, unspecified: Secondary | ICD-10-CM | POA: Diagnosis not present

## 2018-09-13 DIAGNOSIS — N135 Crossing vessel and stricture of ureter without hydronephrosis: Secondary | ICD-10-CM | POA: Diagnosis not present

## 2018-09-13 DIAGNOSIS — F1411 Cocaine abuse, in remission: Secondary | ICD-10-CM | POA: Diagnosis not present

## 2018-09-13 DIAGNOSIS — Z8679 Personal history of other diseases of the circulatory system: Secondary | ICD-10-CM | POA: Diagnosis not present

## 2018-09-13 DIAGNOSIS — Z87828 Personal history of other (healed) physical injury and trauma: Secondary | ICD-10-CM | POA: Insufficient documentation

## 2018-09-13 DIAGNOSIS — Z9889 Other specified postprocedural states: Secondary | ICD-10-CM | POA: Insufficient documentation

## 2018-09-13 DIAGNOSIS — Z23 Encounter for immunization: Secondary | ICD-10-CM | POA: Diagnosis not present

## 2018-09-13 HISTORY — PX: IR URETERAL STENT LEFT NEW ACCESS W/O SEP NEPHROSTOMY CATH: IMG6075

## 2018-09-13 HISTORY — PX: NEPHROLITHOTOMY: SHX5134

## 2018-09-13 LAB — BASIC METABOLIC PANEL
ANION GAP: 8 (ref 5–15)
ANION GAP: 9 (ref 5–15)
BUN: 14 mg/dL (ref 6–20)
BUN: 12 mg/dL (ref 6–20)
CALCIUM: 8.7 mg/dL — AB (ref 8.9–10.3)
CHLORIDE: 106 mmol/L (ref 98–111)
CO2: 26 mmol/L (ref 22–32)
CO2: 25 mmol/L (ref 22–32)
Calcium: 8.4 mg/dL — ABNORMAL LOW (ref 8.9–10.3)
Chloride: 106 mmol/L (ref 98–111)
Creatinine, Ser: 0.89 mg/dL (ref 0.44–1.00)
Creatinine, Ser: 0.81 mg/dL (ref 0.44–1.00)
GFR calc Af Amer: >60 mL/min (ref >60)
GFR calc non Af Amer: >60 mL/min (ref >60)
Glucose, Bld: 99 mg/dL (ref 70–99)
Glucose, Bld: 103 mg/dL — ABNORMAL HIGH (ref 70–99)
POTASSIUM: 3.6 mmol/L (ref 3.5–5.1)
Potassium: 3.6 mmol/L (ref 3.5–5.1)
SODIUM: 140 mmol/L (ref 135–145)
Sodium: 140 mmol/L (ref 135–145)

## 2018-09-13 LAB — CBC
HCT: 41.6 % (ref 36.0–46.0)
HCT: 42.3 % (ref 36.0–46.0)
Hemoglobin: 13.4 g/dL (ref 12.0–15.0)
Hemoglobin: 13.3 g/dL (ref 12.0–15.0)
MCH: 30.5 pg (ref 26.0–34.0)
MCH: 31 pg (ref 26.0–34.0)
MCHC: 32.2 g/dL (ref 30.0–36.0)
MCHC: 31.4 g/dL (ref 30.0–36.0)
MCV: 94.8 fL (ref 80.0–100.0)
MCV: 98.6 fL (ref 80.0–100.0)
NRBC: 0 % (ref 0.0–0.2)
NRBC: 0 % (ref 0.0–0.2)
PLATELETS: 270 10*3/uL (ref 150–400)
Platelets: 284 10*3/uL (ref 150–400)
RBC: 4.39 MIL/uL (ref 3.87–5.11)
RBC: 4.29 MIL/uL (ref 3.87–5.11)
RDW: 11.9 % (ref 11.5–15.5)
RDW: 12.1 % (ref 11.5–15.5)
WBC: 7.7 10*3/uL (ref 4.0–10.5)
WBC: 11.9 10*3/uL — ABNORMAL HIGH (ref 4.0–10.5)

## 2018-09-13 LAB — PROTIME-INR
INR: 0.98
Prothrombin Time: 12.9 seconds (ref 11.4–15.2)

## 2018-09-13 LAB — APTT: aPTT: 30 seconds (ref 24–36)

## 2018-09-13 SURGERY — NEPHROLITHOTOMY PERCUTANEOUS
Anesthesia: General | Laterality: Left

## 2018-09-13 MED ORDER — SODIUM CHLORIDE 0.9 % IR SOLN
Status: DC | PRN
  Administered 2018-09-13: 6000 mL

## 2018-09-13 MED ORDER — TAMSULOSIN HCL 0.4 MG PO CAPS
0.4000 mg | ORAL_CAPSULE | Freq: Every day | ORAL | Status: DC
  Administered 2018-09-14: 0.4 mg via ORAL
  Filled 2018-09-13: qty 1

## 2018-09-13 MED ORDER — PROMETHAZINE HCL 25 MG/ML IJ SOLN
12.5000 mg | Freq: Once | INTRAMUSCULAR | Status: AC
  Administered 2018-09-13: 12.5 mg via INTRAVENOUS
  Filled 2018-09-13: qty 1

## 2018-09-13 MED ORDER — FLUOXETINE HCL 20 MG PO CAPS
20.0000 mg | ORAL_CAPSULE | Freq: Three times a day (TID) | ORAL | Status: DC
  Administered 2018-09-14 (×3): 20 mg via ORAL
  Filled 2018-09-13 (×3): qty 1

## 2018-09-13 MED ORDER — FENTANYL CITRATE (PF) 100 MCG/2ML IJ SOLN
50.0000 ug | INTRAMUSCULAR | Status: AC
  Administered 2018-09-13: 50 ug via INTRAVENOUS

## 2018-09-13 MED ORDER — OXYCODONE HCL 5 MG/5ML PO SOLN
5.0000 mg | Freq: Once | ORAL | Status: DC | PRN
  Filled 2018-09-13: qty 5

## 2018-09-13 MED ORDER — LIDOCAINE HCL (PF) 1 % IJ SOLN
INTRAMUSCULAR | Status: AC | PRN
  Administered 2018-09-13: 10 mL

## 2018-09-13 MED ORDER — DIPHENHYDRAMINE HCL 12.5 MG/5ML PO ELIX: 12.5000 mg | ORAL_SOLUTION | Freq: Four times a day (QID) | ORAL | Status: DC | PRN

## 2018-09-13 MED ORDER — FENTANYL CITRATE (PF) 100 MCG/2ML IJ SOLN
INTRAMUSCULAR | Status: AC
  Filled 2018-09-13: qty 2

## 2018-09-13 MED ORDER — AMPHETAMINE-DEXTROAMPHETAMINE 20 MG PO TABS
20.0000 mg | ORAL_TABLET | Freq: Two times a day (BID) | ORAL | Status: DC
  Administered 2018-09-14 – 2018-09-15 (×3): 20 mg via ORAL
  Filled 2018-09-13 (×3): qty 1

## 2018-09-13 MED ORDER — IOHEXOL 300 MG/ML  SOLN
INTRAMUSCULAR | Status: DC | PRN
  Administered 2018-09-13: 15 mL

## 2018-09-13 MED ORDER — HYOSCYAMINE SULFATE 0.125 MG SL SUBL
0.1250 mg | SUBLINGUAL_TABLET | SUBLINGUAL | Status: DC | PRN
  Filled 2018-09-13: qty 1

## 2018-09-13 MED ORDER — ONDANSETRON HCL 4 MG/2ML IJ SOLN
4.0000 mg | INTRAMUSCULAR | Status: DC | PRN
  Administered 2018-09-13 – 2018-09-14 (×3): 4 mg via INTRAVENOUS
  Filled 2018-09-13 (×3): qty 2

## 2018-09-13 MED ORDER — MIDAZOLAM HCL 2 MG/2ML IJ SOLN
INTRAMUSCULAR | Status: AC
  Filled 2018-09-13: qty 2

## 2018-09-13 MED ORDER — SUGAMMADEX SODIUM 200 MG/2ML IV SOLN
INTRAVENOUS | Status: AC
  Filled 2018-09-13: qty 2

## 2018-09-13 MED ORDER — ONDANSETRON HCL 4 MG/2ML IJ SOLN
INTRAMUSCULAR | Status: DC | PRN
  Administered 2018-09-13: 4 mg via INTRAVENOUS

## 2018-09-13 MED ORDER — LINACLOTIDE 145 MCG PO CAPS
290.0000 ug | ORAL_CAPSULE | Freq: Every day | ORAL | Status: DC
  Administered 2018-09-14 – 2018-09-15 (×2): 290 ug via ORAL
  Filled 2018-09-13 (×2): qty 2

## 2018-09-13 MED ORDER — QUETIAPINE FUMARATE 100 MG PO TABS
100.0000 mg | ORAL_TABLET | Freq: Every day | ORAL | Status: DC
  Administered 2018-09-13 – 2018-09-14 (×2): 100 mg via ORAL
  Filled 2018-09-13 (×2): qty 1

## 2018-09-13 MED ORDER — FENTANYL CITRATE (PF) 100 MCG/2ML IJ SOLN
INTRAMUSCULAR | Status: DC | PRN
  Administered 2018-09-13 (×2): 50 ug via INTRAVENOUS

## 2018-09-13 MED ORDER — PROPOFOL 10 MG/ML IV BOLUS
INTRAVENOUS | Status: DC | PRN
  Administered 2018-09-13: 150 mg via INTRAVENOUS

## 2018-09-13 MED ORDER — MIDAZOLAM HCL 2 MG/2ML IJ SOLN
INTRAMUSCULAR | Status: AC
  Filled 2018-09-13: qty 4

## 2018-09-13 MED ORDER — FENTANYL CITRATE (PF) 100 MCG/2ML IJ SOLN
25.0000 ug | INTRAMUSCULAR | Status: DC | PRN
  Administered 2018-09-13 (×3): 50 ug via INTRAVENOUS

## 2018-09-13 MED ORDER — ONDANSETRON HCL 4 MG/2ML IJ SOLN: 4.0000 mg | Freq: Four times a day (QID) | INTRAMUSCULAR | Status: DC | PRN

## 2018-09-13 MED ORDER — LACTATED RINGERS IV SOLN
INTRAVENOUS | Status: DC
  Administered 2018-09-13 (×2): via INTRAVENOUS

## 2018-09-13 MED ORDER — DIPHENHYDRAMINE HCL 50 MG/ML IJ SOLN: 12.5000 mg | Freq: Four times a day (QID) | INTRAMUSCULAR | Status: DC | PRN

## 2018-09-13 MED ORDER — FENTANYL CITRATE (PF) 100 MCG/2ML IJ SOLN
INTRAMUSCULAR | Status: AC | PRN
  Administered 2018-09-13 (×3): 50 ug via INTRAVENOUS

## 2018-09-13 MED ORDER — SODIUM CHLORIDE 0.9 % IV SOLN
INTRAVENOUS | Status: DC
  Administered 2018-09-13 – 2018-09-14 (×4): via INTRAVENOUS

## 2018-09-13 MED ORDER — DEXAMETHASONE SODIUM PHOSPHATE 10 MG/ML IJ SOLN
INTRAMUSCULAR | Status: DC | PRN
  Administered 2018-09-13: 10 mg via INTRAVENOUS

## 2018-09-13 MED ORDER — MIDAZOLAM HCL 2 MG/2ML IJ SOLN
INTRAMUSCULAR | Status: AC | PRN
  Administered 2018-09-13 (×5): 1 mg via INTRAVENOUS

## 2018-09-13 MED ORDER — OXYCODONE HCL 5 MG PO TABS: 5.0000 mg | ORAL_TABLET | Freq: Once | ORAL | Status: DC | PRN

## 2018-09-13 MED ORDER — CIPROFLOXACIN IN D5W 400 MG/200ML IV SOLN
400.0000 mg | Freq: Once | INTRAVENOUS | Status: AC
  Administered 2018-09-13: 400 mg via INTRAVENOUS

## 2018-09-13 MED ORDER — OXYCODONE-ACETAMINOPHEN 5-325 MG PO TABS
1.0000 | ORAL_TABLET | ORAL | Status: DC | PRN
  Administered 2018-09-14 – 2018-09-15 (×5): 2 via ORAL
  Filled 2018-09-13 (×7): qty 2

## 2018-09-13 MED ORDER — ROCURONIUM BROMIDE 10 MG/ML (PF) SYRINGE
PREFILLED_SYRINGE | INTRAVENOUS | Status: DC | PRN
  Administered 2018-09-13: 50 mg via INTRAVENOUS

## 2018-09-13 MED ORDER — LIDOCAINE 2% (20 MG/ML) 5 ML SYRINGE
INTRAMUSCULAR | Status: AC
  Filled 2018-09-13: qty 5

## 2018-09-13 MED ORDER — SUGAMMADEX SODIUM 200 MG/2ML IV SOLN
INTRAVENOUS | Status: DC | PRN
  Administered 2018-09-13: 200 mg via INTRAVENOUS

## 2018-09-13 MED ORDER — HYDROMORPHONE HCL 1 MG/ML IJ SOLN
INTRAMUSCULAR | Status: AC
  Filled 2018-09-13: qty 1

## 2018-09-13 MED ORDER — SODIUM CHLORIDE 0.9 % IV SOLN
2.0000 g | INTRAVENOUS | Status: AC
  Administered 2018-09-13: 2 g via INTRAVENOUS
  Filled 2018-09-13: qty 20

## 2018-09-13 MED ORDER — SODIUM CHLORIDE 0.9 % IV SOLN: INTRAVENOUS | Status: DC

## 2018-09-13 MED ORDER — BUSPIRONE HCL 5 MG PO TABS
30.0000 mg | ORAL_TABLET | Freq: Two times a day (BID) | ORAL | Status: DC
  Administered 2018-09-14 (×2): 30 mg via ORAL
  Filled 2018-09-13 (×2): qty 6

## 2018-09-13 MED ORDER — DEXAMETHASONE SODIUM PHOSPHATE 10 MG/ML IJ SOLN
INTRAMUSCULAR | Status: AC
  Filled 2018-09-13: qty 1

## 2018-09-13 MED ORDER — INFLUENZA VAC SPLIT QUAD 0.5 ML IM SUSY
0.5000 mL | PREFILLED_SYRINGE | INTRAMUSCULAR | Status: AC
  Administered 2018-09-14: 0.5 mL via INTRAMUSCULAR
  Filled 2018-09-13: qty 0.5

## 2018-09-13 MED ORDER — FENTANYL CITRATE (PF) 100 MCG/2ML IJ SOLN
INTRAMUSCULAR | Status: AC
  Administered 2018-09-13: 50 ug
  Filled 2018-09-13: qty 2

## 2018-09-13 MED ORDER — IOPAMIDOL (ISOVUE-300) INJECTION 61%
INTRAVENOUS | Status: AC
  Administered 2018-09-13: 15 mL
  Filled 2018-09-13: qty 50

## 2018-09-13 MED ORDER — LIDOCAINE 2% (20 MG/ML) 5 ML SYRINGE
INTRAMUSCULAR | Status: DC | PRN
  Administered 2018-09-13: 100 mg via INTRAVENOUS

## 2018-09-13 MED ORDER — HYDROMORPHONE HCL 1 MG/ML IJ SOLN
0.5000 mg | INTRAMUSCULAR | Status: DC | PRN
  Administered 2018-09-13 – 2018-09-14 (×8): 1 mg via INTRAVENOUS
  Administered 2018-09-15: 0.5 mg via INTRAVENOUS
  Filled 2018-09-13 (×9): qty 1

## 2018-09-13 MED ORDER — ACETAMINOPHEN 325 MG PO TABS: 650.0000 mg | ORAL_TABLET | ORAL | Status: DC | PRN

## 2018-09-13 MED ORDER — ONDANSETRON HCL 4 MG/2ML IJ SOLN
INTRAMUSCULAR | Status: AC
  Filled 2018-09-13: qty 2

## 2018-09-13 MED ORDER — PROPOFOL 10 MG/ML IV BOLUS
INTRAVENOUS | Status: AC
  Filled 2018-09-13: qty 20

## 2018-09-13 MED ORDER — IOPAMIDOL (ISOVUE-300) INJECTION 61%
INTRAVENOUS | Status: AC
  Filled 2018-09-13: qty 50

## 2018-09-13 MED ORDER — SUCCINYLCHOLINE CHLORIDE 200 MG/10ML IV SOSY
PREFILLED_SYRINGE | INTRAVENOUS | Status: AC
  Filled 2018-09-13: qty 10

## 2018-09-13 MED ORDER — ROCURONIUM BROMIDE 10 MG/ML (PF) SYRINGE
PREFILLED_SYRINGE | INTRAVENOUS | Status: AC
  Filled 2018-09-13: qty 10

## 2018-09-13 MED ORDER — ZOLPIDEM TARTRATE 5 MG PO TABS: 5.0000 mg | ORAL_TABLET | Freq: Every evening | ORAL | Status: DC | PRN

## 2018-09-13 MED ORDER — HYDROMORPHONE HCL 1 MG/ML IJ SOLN
0.2500 mg | INTRAMUSCULAR | Status: DC | PRN
  Administered 2018-09-13: 0.5 mg via INTRAVENOUS

## 2018-09-13 MED ORDER — FENTANYL CITRATE (PF) 100 MCG/2ML IJ SOLN
50.0000 ug | INTRAMUSCULAR | Status: DC
  Filled 2018-09-13: qty 2

## 2018-09-13 MED ORDER — TIZANIDINE HCL 4 MG PO TABS: 4.0000 mg | ORAL_TABLET | Freq: Three times a day (TID) | ORAL | Status: DC | PRN

## 2018-09-13 MED ORDER — IOPAMIDOL (ISOVUE-300) INJECTION 61%
50.0000 mL | Freq: Once | INTRAVENOUS | Status: AC | PRN
  Administered 2018-09-13: 15 mL

## 2018-09-13 MED ORDER — SUCCINYLCHOLINE CHLORIDE 200 MG/10ML IV SOSY
PREFILLED_SYRINGE | INTRAVENOUS | Status: DC | PRN
  Administered 2018-09-13: 120 mg via INTRAVENOUS

## 2018-09-13 MED ORDER — CIPROFLOXACIN IN D5W 400 MG/200ML IV SOLN
INTRAVENOUS | Status: AC
  Administered 2018-09-13: 400 mg via INTRAVENOUS
  Filled 2018-09-13: qty 200

## 2018-09-13 MED ORDER — LIDOCAINE HCL 1 % IJ SOLN
INTRAMUSCULAR | Status: AC
  Filled 2018-09-13: qty 20

## 2018-09-13 SURGICAL SUPPLY — 59 items
APL SKNCLS STERI-STRIP NONHPOA (GAUZE/BANDAGES/DRESSINGS) ×1
BAG URINE DRAINAGE (UROLOGICAL SUPPLIES) ×3 IMPLANT
BASKET STONE NITINOL 3FRX115MB (UROLOGICAL SUPPLIES) IMPLANT
BASKET ZERO TIP NITINOL 2.4FR (BASKET) IMPLANT
BENZOIN TINCTURE PRP APPL 2/3 (GAUZE/BANDAGES/DRESSINGS) ×3 IMPLANT
BLADE SURG 15 STRL LF DISP TIS (BLADE) ×1 IMPLANT
BLADE SURG 15 STRL SS (BLADE) ×2
BSKT STON RTRVL ZERO TP 2.4FR (BASKET)
CATCHER STONE W/TUBE ADAPTER (UROLOGICAL SUPPLIES) IMPLANT
CATH FOLEY 2W COUNCIL 20FR 5CC (CATHETERS) ×1 IMPLANT
CATH FOLEY 2WAY SLVR  5CC 18FR (CATHETERS) ×1
CATH FOLEY 2WAY SLVR 5CC 18FR (CATHETERS) ×1 IMPLANT
CATH IMAGER II 65CM (CATHETERS) ×1 IMPLANT
CATH ROBINSON RED A/P 20FR (CATHETERS) IMPLANT
CATH URET DUAL LUMEN 6-10FR 50 (CATHETERS) IMPLANT
CATH X-FORCE N30 NEPHROSTOMY (TUBING) ×2 IMPLANT
COVER SURGICAL LIGHT HANDLE (MISCELLANEOUS) ×2 IMPLANT
COVER WAND RF STERILE (DRAPES) ×1 IMPLANT
DRAPE C-ARM 42X120 X-RAY (DRAPES) ×2 IMPLANT
DRAPE LINGEMAN PERC (DRAPES) ×2 IMPLANT
DRAPE SHEET LG 3/4 BI-LAMINATE (DRAPES) ×2 IMPLANT
DRAPE SURG IRRIG POUCH 19X23 (DRAPES) ×2 IMPLANT
DRSG PAD ABDOMINAL 8X10 ST (GAUZE/BANDAGES/DRESSINGS) ×4 IMPLANT
DRSG TEGADERM 8X12 (GAUZE/BANDAGES/DRESSINGS) ×3 IMPLANT
FIBER LASER FLEXIVA 1000 (UROLOGICAL SUPPLIES) IMPLANT
FIBER LASER FLEXIVA 365 (UROLOGICAL SUPPLIES) IMPLANT
FIBER LASER FLEXIVA 550 (UROLOGICAL SUPPLIES) IMPLANT
FIBER LASER TRAC TIP (UROLOGICAL SUPPLIES) IMPLANT
GAUZE SPONGE 4X4 12PLY STRL (GAUZE/BANDAGES/DRESSINGS) ×2 IMPLANT
GLOVE BIO SURGEON STRL SZ8 (GLOVE) ×6 IMPLANT
GLOVE BIOGEL PI IND STRL 8 (GLOVE) IMPLANT
GLOVE BIOGEL PI INDICATOR 8 (GLOVE)
GOWN STRL REUS W/TWL LRG LVL3 (GOWN DISPOSABLE) ×2 IMPLANT
GOWN STRL REUS W/TWL XL LVL3 (GOWN DISPOSABLE) ×3 IMPLANT
GUIDEWIRE AMPLAZ .035X145 (WIRE) ×4 IMPLANT
GUIDEWIRE STR DUAL SENSOR (WIRE) ×3 IMPLANT
IV SET EXTENSION CATH 6 NF (IV SETS) ×1 IMPLANT
KIT BASIN OR (CUSTOM PROCEDURE TRAY) ×2 IMPLANT
KIT PROBE TRILOGY 3.9X350 (MISCELLANEOUS) ×1 IMPLANT
MANIFOLD NEPTUNE II (INSTRUMENTS) ×2 IMPLANT
NDL TROCAR 18X15 ECHO (NEEDLE) IMPLANT
NDL TROCAR 18X20 (NEEDLE) IMPLANT
NEEDLE TROCAR 18X15 ECHO (NEEDLE) IMPLANT
NEEDLE TROCAR 18X20 (NEEDLE) IMPLANT
PACK CYSTO (CUSTOM PROCEDURE TRAY) ×2 IMPLANT
PAD ABD 8X10 STRL (GAUZE/BANDAGES/DRESSINGS) ×1 IMPLANT
SET AMPLATZ RENAL DILATOR (MISCELLANEOUS) IMPLANT
SHEATH PEELAWAY SET 9 (SHEATH) ×1 IMPLANT
SPONGE LAP 4X18 RFD (DISPOSABLE) ×2 IMPLANT
STENT CONTOUR 6FRX26X.038 (STENTS) IMPLANT
STONE CATCHER W/TUBE ADAPTER (UROLOGICAL SUPPLIES) ×2 IMPLANT
SUT SILK 2 0 30  PSL (SUTURE) ×1
SUT SILK 2 0 30 PSL (SUTURE) ×1 IMPLANT
SYR 10ML LL (SYRINGE) ×2 IMPLANT
SYR 20CC LL (SYRINGE) ×4 IMPLANT
SYR 50ML LL SCALE MARK (SYRINGE) ×2 IMPLANT
TOWEL OR 17X26 10 PK STRL BLUE (TOWEL DISPOSABLE) ×2 IMPLANT
TUBING CONNECTING 10 (TUBING) ×4 IMPLANT
WATER STERILE IRR 1000ML POUR (IV SOLUTION) ×2 IMPLANT

## 2018-09-13 NOTE — Discharge Instructions (Signed)
Percutaneous Nephrostomy Home Guide Percutaneous nephrostomy is a procedure to insert a flexible tube into your kidney so that urine can leave your body. This procedure may be done if a medical condition prevents urine from leaving your kidney in the usual way. During the procedure, the nephrostomy tube is inserted in the right or left side of your lower back and is connected to an external drainage bag. After you have a nephrostomy tube placed, urine will collect in the drainage bag outside of your body. You will need to empty and change the drainage bag as needed. You will also need to take steps to care for the area where the nephrostomy tube was inserted (tube insertion site). How do I care for my nephrostomy tube?  Always keep your tubing, the leg bag, or the bedside drainage bag below the level of your kidney so that your urine drains freely.  Avoid activities that would cause bending or pulling of your tubing. Ask your health care provider what activities are safe for you.  When connecting your nephrostomy tube to a drainage bag, make sure that there are no kinks in the tubing and that your urine is draining freely. You may want to gently wrap an elastic bandage over the tubing. This will help keep the tubing in place and prevent it from kinking. Make sure there is no tension on the tubing so it does not become dislodged.  At night, you may want to connect your nephrostomy tube or the leg bag to a larger bedside drainage bag. How do I empty the drainage bag? Empty the leg bag or bedside drainage bag whenever it becomes ? full. Also empty it before you go to sleep. Most drainage bags have a drain at the bottom that allows urine to be emptied. Follow these basic steps: 1. Hold the drainage bag over a toilet or collection container. Use a measuring container if your health care provider told you to measure your urine. 2. Open the drain of the bag and allow the urine to drain out. 3. After all the  urine has drained from the drainage bag, close the drain fully. 4. Flush the urine down the toilet. If a collection container was used, rinse the container.  How do I change the dressing around the nephrostomy tube? Change your dressing and clean your tube exit site as told by your health care provider. You may need to change the dressing every day for the first 2 weeks after having a nephrostomy tube inserted. After the first 2 weeks, you may be told to change the dressing two times a week. Supplies needed:  Mild soap and water.  Split gauze pads, 4  4 inches (10 x 10 cm).  Gauze pads, 4  4 inches (10 x 10 cm).  Paper tape. How to change the dressing: Because of the location of your nephrostomy tube, you may need help from another person to complete dressing changes. Follow these basic steps: 1. Wash hands with soap and water. 2. Gently remove the tape and dressing from around the nephrostomy tube. Be careful not to pull on the tube while removing the dressing. Avoid using scissors because they may damage the tube. 3. Wash the skin around the tube with mild soap and water, rinse well, and pat the skin dry with a clean cloth. 4. Check the skin around the drain for redness, swelling, pus, warmth, or a bad smell. 5. If the drain was sutured to the skin, check the suture to  verify that it is still anchored in the skin. 6. Place two split gauze pads in and around the tube exit site. Do not apply ointments or alcohol to the site. 7. Place a gauze pad on top of the split gauze pad. 8. Coil the tube on top of the gauze. The tubing should rest on the gauze, not on the skin. 9. Place tape around each edge of the gauze pad. 10. Secure the nephrostomy tubing. Make sure that the tube does not kink or become pinched. The tubing should rest on the gauze pad, not on the skin. 11. Dispose of used supplies properly.  How do I flush my nephrostomy tube? Use a saline syringe to rinse out (flush) your  nephrostomy tube as told by your health care provider. Flushing is easier if a three-way stopcock is placed between the tube and the drainage bag. One connection of the stopcock connects to your tube, the second connects to the drainage bag, and the third is usually covered with a cap. The lever on the stopcock points to the direction on the stopcock that is closed to flow. Normally, the lever points in the direction of the cap to allow urine to drain from the tube to the drainage bag. Supplies needed:  Rubbing alcohol wipe.  10 mL 0.9% saline syringe. How to flush the tube: 1. Move the lever of the three-way stopcock so it points toward the drainage bag. 2. Clean the cap with a rubbing alcohol wipe. 3. Screw the tip of a 10 mL 0.9% saline syringe onto the cap. 4. Using the syringe plunger, slowly push the 10 mL 0.9% saline in the syringe over 5-10 seconds. If resistance is met or pain occurs while pushing, stop pushing the saline. 5. Remove the syringe from the cap. 6. Return the stopcock lever to the usual position, pointing in the direction of the cap. 7. Dispose of used supplies properly. How do I replace the drainage bag? Replace the drainage bag, three-way stopcock, and any extension tubing as told by your health care provider. Make sure you always have an extra drainage bag and connecting tubing available. 1. Empty urine from your drainage bag. 2. Gather a new drainage bag, three-way stopcock, and any extension tubing. 3. Remove the drainage bag, three-way stopcock, and any extension tubing from the nephrostomy tube. 4. Attach the new leg bag or bedside drainage bag, three-way stopcock, and any extension tubing to the nephrostomy tube. 5. Dispose of the used drainage bag, three-way stopcock, and any extension.  Contact a health care provider if:  You have problems with any of the valves or tubing.  You have persistent pain or soreness in your back.  You have more redness, swelling,  or pain around your tube insertion site.  You have more fluid or blood coming from your tube insertion site.  Your tube insertion site feels warm to the touch.  You have pus or a bad smell coming from your tube insertion site.  You have increased urine output or you feel burning when urinating. Get help right away if:  You have pain in your abdomen during the first week.  You have chest pain or have trouble breathing.  You have a new appearance of blood in your urine.  You have a fever or chills.  You have back pain that is not relieved by your medicine.  You have decreased urine output.  Your nephrostomy tube comes out. This information is not intended to replace advice given to  you by your health care provider. Make sure you discuss any questions you have with your health care provider. °Document Released: 08/13/2013 Document Revised: 08/04/2016 Document Reviewed: 08/04/2016 °Elsevier Interactive Patient Education © 2018 [employer] ° ° °Moderate Conscious Sedation, Adult, Care After °These instructions provide you with information about caring for yourself after your procedure. Your health care provider may also give you more specific instructions. Your treatment has been planned according to current medical practices, but problems sometimes occur. Call your health care provider if you have any problems or questions after your procedure. °What can I expect after the procedure? °After your procedure, it is common: °· To feel sleepy for several hours. °· To feel clumsy and have poor balance for several hours. °· To have poor judgment for several hours. °· To vomit if you eat too soon. ° °Follow these instructions at home: °For at least 24 hours after the procedure: ° °· Do not: °? Participate in activities where you could fall or become injured. °? Drive. °? Use heavy machinery. °? Drink alcohol. °? Take sleeping pills or medicines that cause drowsiness. °? Make important decisions or sign  legal documents. °? Take care of children on your own. °· Rest. °Eating and drinking °· Follow the diet recommended by your health care provider. °· If you vomit: °? Drink water, juice, or soup when you can drink without vomiting. °? Make sure you have little or no nausea before eating solid foods. °General instructions °· Have a responsible adult stay with you until you are awake and alert. °· Take over-the-counter and prescription medicines only as told by your health care provider. °· If you smoke, do not smoke without supervision. °· Keep all follow-up visits as told by your health care provider. This is important. °Contact a health care provider if: °· You keep feeling nauseous or you keep vomiting. °· You feel light-headed. °· You develop a rash. °· You have a fever. °Get help right away if: °· You have trouble breathing. °This information is not intended to replace advice given to you by your health care provider. Make sure you discuss any questions you have with your health care provider. °Document Released: 08/13/2013 Document Revised: 03/27/2016 Document Reviewed: 02/12/2016 °Elsevier Interactive Patient Education © 2018 [employer] Inc[employer] ° °

## 2018-09-13 NOTE — Anesthesia Procedure Notes (Signed)
Procedure Name: Intubation Date/Time: 09/13/2018 12:31 PM Performed by: Lind Covert, CRNA Pre-anesthesia Checklist: Patient identified, Emergency Drugs available, Suction available, Patient being monitored and Timeout performed Patient Re-evaluated:Patient Re-evaluated prior to induction Oxygen Delivery Method: Circle system utilized Preoxygenation: Pre-oxygenation with 100% oxygen Induction Type: IV induction Laryngoscope Size: Mac and 3 Grade View: Grade II Tube type: Oral Tube size: 7.0 mm Number of attempts: 2 Airway Equipment and Method: Stylet Placement Confirmation: ETT inserted through vocal cords under direct vision,  positive ETCO2 and breath sounds checked- equal and bilateral Secured at: 22 cm Tube secured with: Tape Dental Injury: Teeth and Oropharynx as per pre-operative assessment
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
## 2018-09-13 NOTE — Op Note (Signed)
Preoperative diagnosis: Left renal stone  Postoperative diagnosis: Same  Procedure 1.  Left percutaneous nephrostolithotomy for stone greater than 2 cm 2.  Left nephrostogram 3.  Intraoperative fluoroscopy, under 1 hour, with interpretation 4.  Placement of a 21 French nephrostomy tube 5.  Dilation of percutaneous tract  Attending: Dr. Ronne Binning  Anesthesia: General  Estimated blood loss: Minimal  Antibiotics: rocephin  Drains: 1.  16 French Foley catheter 2.  6 x 26 left double-J ureteral stent 3.  18 French nephrostomy tube  Specimens: Stone for analysis  Findings: 2.5cm left renal pelvis calculus  Indications: Patient is a 47 year old female with a history of large left renal calculus who previously underwent ureteral stent placement for pain control.  After discussing treatment options and decided she was left percutaneous nephrostolithotomy.  Patient already has a nephrostomy tube and stent in place.  Procedure in detail: Prior to procedure consent was obtained.  Patient was brought to the operating room debridement was done to ensure correct patient, correct procedure, and correct site.  General anesthesia was administered.  A 16 French Foley catheter was in place.  The patient was then placed in the prone position.  His nephrostomy tube and left flank was then prepped and draped in usual sterile fashion.  A nephrostogram was obtained and findings noted above.  Through the nephrostomy tube we then placed a sensor wire down to the bladder.  We then removed the nephrostomy tube and used a dual luman catheter to advance a super stiff wire down to the bladder.  We then made an incision at the level of the skin and over the wire we then placed a NephroMax dilator.  We dilated the nephrostomy tract to 30 [place] and held this 18 cm of water for 1 minute.  We then  placed the access sheath over the balloon.  The balloon was then deflated.  We then used a rigid nephroscope to perform  nephroscopy.  We encountered a large renal pelvis stone.  We then used a the Trilogy lithotripter to fragment the stone. The fragments were removed with a rigid grasper.  We then placed a 18 French nephrostomy tube through the sheath into the renal pelvis.  The balloon was inflated with 3ml of contrast.  We then removed the access sheath in and obtain another nephrostogram.  We noted minimal extravasation of contrast.  We then secured the nephrostomy tubes with 0 silks in interrupted fashion.  Dressing was placed over the nephrostomy tube site and this then concluded the procedure was well-tolerated by the patient.  Complications: None  Condition: Stable, extubated, transferred to PACU  Plan: Patient is to be admitted overnight for observation.  His Foley catheter through the morning.  He is then to be discharged home and followup in 3 days for nephrostomy tube removal

## 2018-09-13 NOTE — Progress Notes (Signed)
Pt in short stay from 1032 to 1220 awaiting her surgery time post IR procedure.

## 2018-09-13 NOTE — Anesthesia Postprocedure Evaluation (Signed)
Anesthesia Post Note  Patient: Rebecca Andrade  Procedure(s) Performed: NEPHROLITHOTOMY PERCUTANEOUS (Left )     Patient location during evaluation: PACU Anesthesia Type: General Level of consciousness: awake and alert Pain management: pain level controlled Vital Signs Assessment: post-procedure vital signs reviewed and stable Respiratory status: spontaneous breathing, nonlabored ventilation, respiratory function stable and patient connected to nasal cannula oxygen Cardiovascular status: blood pressure returned to baseline and stable Postop Assessment: no apparent nausea or vomiting Anesthetic complications: no    Last Vitals:  Vitals:   09/13/18 1445 09/13/18 1500  BP: 115/76 115/78  Pulse: 60 77  Resp: 12 12  Temp:  36.8 C  SpO2: 100% 97%    Last Pain:  Vitals:   09/13/18 1500  TempSrc:   PainSc: 4                  Arael Piccione S

## 2018-09-13 NOTE — OR Nursing (Signed)
Stone taken per Dr. McKenzie. 

## 2018-09-13 NOTE — Procedures (Signed)
Interventional Radiology Procedure Note  Procedure: Placement of a left percutaneous nephro-ureteral catheter, via inferior posterior calyx.  92F catheter to the bladder, which will accept any 035 wire.  The stent in the ureter was mobile during the catheter/wire manipulation.  .  Complications: None  Recommendations:  - To the operating room today.     Signed,  Yvone Neu. Loreta Ave, DO

## 2018-09-13 NOTE — Anesthesia Preprocedure Evaluation (Signed)
Anesthesia Evaluation  Patient identified by MRN, date of birth, ID band Patient awake    Reviewed: Allergy & Precautions, H&P , NPO status , Patient's Chart, lab work & pertinent test results  History of Anesthesia Complications (+) PONV and history of anesthetic complications  Airway Mallampati: II   Neck ROM: full    Dental   Pulmonary    breath sounds clear to auscultation       Cardiovascular negative cardio ROS   Rhythm:regular Rate:Normal     Neuro/Psych  Headaches, PSYCHIATRIC DISORDERS Anxiety Depression    GI/Hepatic (+)     substance abuse  cocaine use,   Endo/Other  obese  Renal/GU Renal diseasestones     Musculoskeletal   Abdominal   Peds  Hematology   Anesthesia Other Findings   Reproductive/Obstetrics                             Anesthesia Physical Anesthesia Plan  ASA: II  Anesthesia Plan: General   Post-op Pain Management:    Induction: Intravenous  PONV Risk Score and Plan: 4 or greater and Ondansetron, Dexamethasone, Midazolam, Scopolamine patch - Pre-op and Treatment may vary due to age or medical condition  Airway Management Planned: Oral ETT  Additional Equipment:   Intra-op Plan:   Post-operative Plan: Extubation in OR  Informed Consent: I have reviewed the patients History and Physical, chart, labs and discussed the procedure including the risks, benefits and alternatives for the proposed anesthesia with the patient or authorized representative who has indicated his/her understanding and acceptance.     Plan Discussed with: CRNA, Anesthesiologist and Surgeon  Anesthesia Plan Comments:         Anesthesia Quick Evaluation

## 2018-09-13 NOTE — Transfer of Care (Signed)
Immediate Anesthesia Transfer of Care Note  Patient: Rebecca Andrade  Procedure(s) Performed: NEPHROLITHOTOMY PERCUTANEOUS (Left )  Patient Location: PACU  Anesthesia Type:General  Level of Consciousness: sedated  Airway & Oxygen Therapy: Patient Spontanous Breathing and Patient connected to face mask oxygen  Post-op Assessment: Report given to RN and Post -op Vital signs reviewed and stable  Post vital signs: Reviewed and stable  Last Vitals:  Vitals Value Taken Time  BP 119/78 09/13/2018  2:00 PM  Temp    Pulse 93 09/13/2018  2:00 PM  Resp 17 09/13/2018  2:00 PM  SpO2 100 % 09/13/2018  2:00 PM  Vitals shown include unvalidated device data.  Last Pain:  Vitals:   09/13/18 1033  TempSrc: Oral  PainSc:          Complications: No apparent anesthesia complications
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
## 2018-09-14 ENCOUNTER — Encounter (HOSPITAL_COMMUNITY): Payer: Self-pay | Admitting: Urology

## 2018-09-14 DIAGNOSIS — Z9889 Other specified postprocedural states: Secondary | ICD-10-CM | POA: Diagnosis not present

## 2018-09-14 DIAGNOSIS — Z8679 Personal history of other diseases of the circulatory system: Secondary | ICD-10-CM | POA: Diagnosis not present

## 2018-09-14 DIAGNOSIS — Z23 Encounter for immunization: Secondary | ICD-10-CM | POA: Diagnosis not present

## 2018-09-14 DIAGNOSIS — N189 Chronic kidney disease, unspecified: Secondary | ICD-10-CM | POA: Diagnosis not present

## 2018-09-14 DIAGNOSIS — Z87828 Personal history of other (healed) physical injury and trauma: Secondary | ICD-10-CM | POA: Diagnosis not present

## 2018-09-14 DIAGNOSIS — F1411 Cocaine abuse, in remission: Secondary | ICD-10-CM | POA: Diagnosis not present

## 2018-09-14 DIAGNOSIS — N200 Calculus of kidney: Secondary | ICD-10-CM | POA: Diagnosis not present

## 2018-09-14 LAB — BASIC METABOLIC PANEL
Anion gap: 10 (ref 5–15)
BUN: 9 mg/dL (ref 6–20)
CHLORIDE: 110 mmol/L (ref 98–111)
CO2: 20 mmol/L — AB (ref 22–32)
Calcium: 8.4 mg/dL — ABNORMAL LOW (ref 8.9–10.3)
Creatinine, Ser: 0.73 mg/dL (ref 0.44–1.00)
GFR calc Af Amer: >60 mL/min (ref >60)
GLUCOSE: 145 mg/dL — AB (ref 70–99)
POTASSIUM: 4 mmol/L (ref 3.5–5.1)
Sodium: 140 mmol/L (ref 135–145)

## 2018-09-14 LAB — CBC
HEMATOCRIT: 42.8 % (ref 36.0–46.0)
Hemoglobin: 13.6 g/dL (ref 12.0–15.0)
MCH: 30.5 pg (ref 26.0–34.0)
MCHC: 31.8 g/dL (ref 30.0–36.0)
MCV: 96 fL (ref 80.0–100.0)
PLATELETS: 294 10*3/uL (ref 150–400)
RBC: 4.46 MIL/uL (ref 3.87–5.11)
RDW: 11.9 % (ref 11.5–15.5)
WBC: 11.7 10*3/uL — ABNORMAL HIGH (ref 4.0–10.5)
nRBC: 0 % (ref 0.0–0.2)

## 2018-09-14 NOTE — Progress Notes (Signed)
Pt states multiple times that she is nervous and anxious about going home. Pt states, "I can't go home like this with all these tubes, I want to to wait until all of the tubes have been removed." This RN attempted to reassure pt that when pt was discharged home that she would be given education/instructions on post-op care. Pt remains adamant that she does not want to be discharged with foley for nephrostomy in place.

## 2018-09-14 NOTE — Progress Notes (Signed)
1 Day Post-Op Subjective: Patient reports mild left flank pain.patient is very anxious about discharge due to pain.  Objective: Vital signs in last 24 hours: Temp:  [97.6 F (36.4 C)-98.2 F (36.8 C)] 97.8 F (36.6 C) (11/09 1224) Pulse Rate:  [62-81] 80 (11/09 1224) Resp:  [18-20] 20 (11/09 0506) BP: (109-130)/(75-90) 119/82 (11/09 1224) SpO2:  [97 %-100 %] 99 % (11/09 1224)  Intake/Output from previous day: 11/08 0701 - 11/09 0700 In: 2704.7 [P.O.:120; I.V.:2584.7] Out: 1175 [Urine:1125; Blood:50] Intake/Output this shift: Total I/O In: 800 [I.V.:800] Out: 1125 [Urine:1125]  Physical Exam:  General:alert, cooperative and appears stated age GI: soft, non tender, normal bowel sounds, no palpable masses, no organomegaly, no inguinal hernia Female genitalia: not done Extremities: extremities normal, atraumatic, no cyanosis or edema  Lab Results: Recent Labs    09/13/18 0843 09/13/18 1430 09/14/18 0458  HGB 13.4 13.3 13.6  HCT 41.6 42.3 42.8   BMET Recent Labs    09/13/18 1430 09/14/18 0458  NA 140 140  K 3.6 4.0  CL 106 110  CO2 25 20*  GLUCOSE 103* 145*  BUN 12 9  CREATININE 0.81 0.73  CALCIUM 8.4* 8.4*   Recent Labs    09/13/18 0843  INR 0.98   No results for input(s): LABURIN in the last 72 hours. Results for orders placed or performed in visit on 11/28/17  Urine Culture     Status: None   Collection Time: 11/28/17  2:26 PM  Result Value Ref Range Status   Urine Culture, Routine Final report  Final   Organism ID, Bacteria Comment  Final    Comment: Culture shows less than 10,000 colony forming units of bacteria per milliliter of urine. This colony count is not generally considered to be clinically significant.     Studies/Results: Dg C-arm 1-60 Min-no Report  Result Date: 09/13/2018 Fluoroscopy was utilized by the requesting physician.  No radiographic interpretation.   Ir Ureteral Stent Left New Access W/o Sep Nephrostomy Cath  Result  Date: 09/13/2018 INDICATION: 47 year old female with obstructing stone at the left ureteropelvic junction. EXAM: IR URETURAL STENT LEFT NEW ACCESS W/O SEP NEPHROSTOMY CATH COMPARISON:  None. MEDICATIONS: 400 mg Cipro; The antibiotic was administered in an appropriate time frame prior to skin puncture. ANESTHESIA/SEDATION: Fentanyl 150 mcg IV; Versed 5.0 mg IV Moderate Sedation Time:  26 minutes The patient was continuously monitored during the procedure by the interventional radiology nurse under my direct supervision. CONTRAST:  15mL ISOVUE-300 IOPAMIDOL (ISOVUE-300) INJECTION 61%, 15mL ISOVUE-300 IOPAMIDOL (ISOVUE-300) INJECTION 61% - administered into the collecting system(s) FLUOROSCOPY TIME:  Fluoroscopy Time: 2 minutes 26 seconds (29 mGy). COMPLICATIONS: None PROCEDURE: Informed written consent was obtained from the patient after a thorough discussion of the procedural risks, benefits and alternatives. All questions were addressed. Maximal Sterile Barrier Technique was utilized including caps, mask, sterile gowns, sterile gloves, sterile drape, hand hygiene and skin antiseptic. A timeout was performed prior to the initiation of the procedure. Patient positioned prone position on the fluoroscopy table. Ultrasound survey of the left flank was performed with images stored and sent to PACs. The patient was then prepped and draped in the usual sterile fashion. 1% lidocaine was used to anesthetize the skin and subcutaneous tissues for local anesthesia. A Chiba needle was then used to access a posterior inferior calyx with ultrasound guidance. With spontaneous urine returned through the needle, passage of an 018 micro wire into the collecting system was performed under fluoroscopy. A small incision was made with  an 11 blade scalpel, and the needle was removed from the wire. An Accustick system was then advanced over the wire into the collecting system under fluoroscopy. The metal stiffener and inner dilator were  removed, and a coaxial system was advanced over the wire into the ureter. 035 wire was advanced through the ureter. Kumpe the catheter was then advanced over the wire into the urinary bladder. Wire was removed and final contrast injection confirmed location within the urinary bladder. Patient tolerated the procedure well and remained hemodynamically stable throughout. No complications were encountered and no significant blood loss encountered IMPRESSION: Status post image guided left-sided percutaneous access for nephrolithotomy, scheduled today. The 5 French catheter terminating within the urinary bladder is via an inferior posterior calyx and will accept any 035 wire to the bladder. Signed, Rebecca Andrade, RPVI [facility] Electronically Signed   By: Gilmer Mor D.O.   On: 09/13/2018 10:35    Assessment/Plan: POD#1 Left PCNL  1. Discontinue foley 2. Likely discharge tomorrow after neph tube removal   LOS: 0 days   Rebecca Andrade 09/14/2018, 5:45 PM

## 2018-09-15 DIAGNOSIS — Z9889 Other specified postprocedural states: Secondary | ICD-10-CM | POA: Diagnosis not present

## 2018-09-15 DIAGNOSIS — Z8679 Personal history of other diseases of the circulatory system: Secondary | ICD-10-CM | POA: Diagnosis not present

## 2018-09-15 DIAGNOSIS — N200 Calculus of kidney: Secondary | ICD-10-CM | POA: Diagnosis not present

## 2018-09-15 DIAGNOSIS — Z87828 Personal history of other (healed) physical injury and trauma: Secondary | ICD-10-CM | POA: Diagnosis not present

## 2018-09-15 DIAGNOSIS — F1411 Cocaine abuse, in remission: Secondary | ICD-10-CM | POA: Diagnosis not present

## 2018-09-15 DIAGNOSIS — N189 Chronic kidney disease, unspecified: Secondary | ICD-10-CM | POA: Diagnosis not present

## 2018-09-15 DIAGNOSIS — Z23 Encounter for immunization: Secondary | ICD-10-CM | POA: Diagnosis not present

## 2018-09-15 MED ORDER — OXYCODONE-ACETAMINOPHEN 10-325 MG PO TABS: 1.0000 | ORAL_TABLET | ORAL | 0 refills | Status: DC | PRN

## 2018-09-15 MED ORDER — PROMETHAZINE HCL 50 MG PO TABS: 50.0000 mg | ORAL_TABLET | Freq: Four times a day (QID) | ORAL | 0 refills | Status: DC | PRN

## 2018-09-15 NOTE — Discharge Instructions (Signed)

## 2018-09-19 ENCOUNTER — Telehealth (HOSPITAL_COMMUNITY): Payer: Self-pay

## 2018-09-19 ENCOUNTER — Other Ambulatory Visit (HOSPITAL_COMMUNITY): Payer: Self-pay | Admitting: Psychiatry

## 2018-09-19 DIAGNOSIS — F900 Attention-deficit hyperactivity disorder, predominantly inattentive type: Secondary | ICD-10-CM

## 2018-09-19 MED ORDER — AMPHETAMINE-DEXTROAMPHETAMINE 10 MG PO TABS: ORAL_TABLET | ORAL | 0 refills | Status: DC

## 2018-09-19 NOTE — Telephone Encounter (Signed)
Prescription called to her pharmacy.

## 2018-09-19 NOTE — Telephone Encounter (Signed)
Patient follows uop in December, she wants a refill on her Adderall. The pharmacy has not changed. Please review and advise, thank you

## 2018-09-23 DIAGNOSIS — N200 Calculus of kidney: Secondary | ICD-10-CM | POA: Diagnosis not present

## 2018-10-08 ENCOUNTER — Ambulatory Visit: Payer: Medicare HMO | Admitting: Adult Health

## 2018-10-09 ENCOUNTER — Ambulatory Visit
Admission: RE | Admit: 2018-10-09 | Discharge: 2018-10-09 | Disposition: A | Discharge status: Home / Self Care | Payer: Medicare HMO | Source: Ambulatory Visit | Attending: Family Medicine | Admitting: Family Medicine

## 2018-10-09 DIAGNOSIS — Z1231 Encounter for screening mammogram for malignant neoplasm of breast: Secondary | ICD-10-CM

## 2018-10-10 ENCOUNTER — Ambulatory Visit (HOSPITAL_COMMUNITY): Payer: Self-pay | Admitting: Psychiatry

## 2018-10-13 ENCOUNTER — Other Ambulatory Visit (HOSPITAL_COMMUNITY): Payer: Self-pay | Admitting: Psychiatry

## 2018-10-13 DIAGNOSIS — F332 Major depressive disorder, recurrent severe without psychotic features: Secondary | ICD-10-CM

## 2018-10-14 ENCOUNTER — Ambulatory Visit (INDEPENDENT_AMBULATORY_CARE_PROVIDER_SITE_OTHER): Payer: Medicare HMO | Admitting: Psychiatry

## 2018-10-14 ENCOUNTER — Encounter (HOSPITAL_COMMUNITY): Payer: Self-pay | Admitting: Psychiatry

## 2018-10-14 VITALS — BP 128/72 | HR 68 | Ht 67.0 in | Wt 200.0 lb

## 2018-10-14 DIAGNOSIS — F332 Major depressive disorder, recurrent severe without psychotic features: Secondary | ICD-10-CM | POA: Diagnosis not present

## 2018-10-14 DIAGNOSIS — F4010 Social phobia, unspecified: Secondary | ICD-10-CM

## 2018-10-14 DIAGNOSIS — F900 Attention-deficit hyperactivity disorder, predominantly inattentive type: Secondary | ICD-10-CM

## 2018-10-14 MED ORDER — BUSPIRONE HCL 30 MG PO TABS: 30.0000 mg | ORAL_TABLET | Freq: Two times a day (BID) | ORAL | 2 refills | Status: DC

## 2018-10-14 MED ORDER — QUETIAPINE FUMARATE 100 MG PO TABS: 100.0000 mg | ORAL_TABLET | Freq: Every day | ORAL | 2 refills | Status: DC

## 2018-10-14 MED ORDER — VENLAFAXINE HCL ER 37.5 MG PO CP24: 37.5000 mg | ORAL_CAPSULE | Freq: Every day | ORAL | 2 refills | Status: DC

## 2018-10-14 MED ORDER — FLUOXETINE HCL 20 MG PO CAPS: 20.0000 mg | ORAL_CAPSULE | Freq: Three times a day (TID) | ORAL | 2 refills | Status: DC

## 2018-10-14 MED ORDER — AMPHETAMINE-DEXTROAMPHETAMINE 10 MG PO TABS: ORAL_TABLET | ORAL | 0 refills | Status: DC
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
## 2018-10-16 ENCOUNTER — Other Ambulatory Visit: Payer: Self-pay | Admitting: Family Medicine

## 2018-11-04 DIAGNOSIS — N200 Calculus of kidney: Secondary | ICD-10-CM | POA: Diagnosis not present

## 2018-11-18 ENCOUNTER — Telehealth (HOSPITAL_COMMUNITY): Payer: Self-pay

## 2018-11-18 DIAGNOSIS — F900 Attention-deficit hyperactivity disorder, predominantly inattentive type: Secondary | ICD-10-CM

## 2018-11-18 MED ORDER — AMPHETAMINE-DEXTROAMPHETAMINE 10 MG PO TABS: ORAL_TABLET | ORAL | 0 refills | Status: DC

## 2018-11-18 NOTE — Telephone Encounter (Signed)
Patient is calling for a refill on her Adderall, pharmacy in chart is correct.

## 2018-11-18 NOTE — Telephone Encounter (Signed)
[facility].

## 2018-12-18 ENCOUNTER — Telehealth (HOSPITAL_COMMUNITY): Payer: Self-pay

## 2018-12-18 DIAGNOSIS — F900 Attention-deficit hyperactivity disorder, predominantly inattentive type: Secondary | ICD-10-CM

## 2018-12-18 MED ORDER — AMPHETAMINE-DEXTROAMPHETAMINE 10 MG PO TABS: ORAL_TABLET | ORAL | 0 refills | Status: DC

## 2018-12-18 NOTE — Telephone Encounter (Signed)
Prescription called.  She can pick up on February 14.

## 2018-12-18 NOTE — Telephone Encounter (Signed)
Patient is calling for a refill on her Adderall, [facility] [address] thank you

## 2019-01-13 ENCOUNTER — Ambulatory Visit (INDEPENDENT_AMBULATORY_CARE_PROVIDER_SITE_OTHER): Payer: Medicare HMO | Admitting: Psychiatry

## 2019-01-13 ENCOUNTER — Encounter (HOSPITAL_COMMUNITY): Payer: Self-pay | Admitting: Psychiatry

## 2019-01-13 DIAGNOSIS — F4010 Social phobia, unspecified: Secondary | ICD-10-CM | POA: Diagnosis not present

## 2019-01-13 DIAGNOSIS — F332 Major depressive disorder, recurrent severe without psychotic features: Secondary | ICD-10-CM

## 2019-01-13 DIAGNOSIS — F900 Attention-deficit hyperactivity disorder, predominantly inattentive type: Secondary | ICD-10-CM | POA: Diagnosis not present

## 2019-01-13 MED ORDER — QUETIAPINE FUMARATE 100 MG PO TABS: 100.0000 mg | ORAL_TABLET | Freq: Every day | ORAL | 1 refills | Status: DC

## 2019-01-13 MED ORDER — AMPHETAMINE-DEXTROAMPHETAMINE 10 MG PO TABS: ORAL_TABLET | ORAL | 0 refills | Status: DC

## 2019-01-13 MED ORDER — BUSPIRONE HCL 30 MG PO TABS: 30.0000 mg | ORAL_TABLET | Freq: Two times a day (BID) | ORAL | 1 refills | Status: DC

## 2019-01-13 MED ORDER — FLUOXETINE HCL 40 MG PO CAPS: 40.0000 mg | ORAL_CAPSULE | Freq: Every day | ORAL | 1 refills | Status: DC

## 2019-01-13 MED ORDER — VENLAFAXINE HCL ER 75 MG PO CP24: 75.0000 mg | ORAL_CAPSULE | Freq: Every day | ORAL | 1 refills | Status: DC
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
## 2019-01-27 ENCOUNTER — Telehealth: Payer: Self-pay | Admitting: Family Medicine

## 2019-01-27 NOTE — Telephone Encounter (Signed)
  Pt called wanting to schedule appointment for cough  States she has had all year , no other symptoms,  No travel

## 2019-01-27 NOTE — Telephone Encounter (Signed)
Ok to schedule for tomorrow as long as no fever, chills, shortness of breath. We can also give her the option of a telephone visit in the morning. Chronic cough can be related to various causes including reflux, allergies etc and we could talk about this over the phone. Thanks.

## 2019-01-28 NOTE — Telephone Encounter (Signed)
Left message for pt to call.

## 2019-02-12 ENCOUNTER — Telehealth (HOSPITAL_COMMUNITY): Payer: Self-pay

## 2019-02-12 DIAGNOSIS — F900 Attention-deficit hyperactivity disorder, predominantly inattentive type: Secondary | ICD-10-CM

## 2019-02-12 MED ORDER — AMPHETAMINE-DEXTROAMPHETAMINE 10 MG PO TABS: ORAL_TABLET | ORAL | 0 refills | Status: DC

## 2019-02-12 NOTE — Telephone Encounter (Signed)
Medication management - Telephone call with patient to inform her new requested Adderall order was sent into her pharmacy by Dr. Lolly Mustache but she could not fill early and could be filled on 02/18/19. Patient stated understanding.

## 2019-02-12 NOTE — Telephone Encounter (Signed)
Medication management - Patient left a message she is in need of a new Adderall prescription, last filled 01/13/19.

## 2019-02-12 NOTE — Telephone Encounter (Signed)
Last prescription done on March 14th. I send but should not be taken early.

## 2019-03-20 ENCOUNTER — Telehealth (HOSPITAL_COMMUNITY): Payer: Self-pay

## 2019-03-20 ENCOUNTER — Other Ambulatory Visit: Payer: Self-pay

## 2019-03-20 ENCOUNTER — Encounter (HOSPITAL_COMMUNITY): Payer: Self-pay | Admitting: Psychiatry

## 2019-03-20 ENCOUNTER — Ambulatory Visit (INDEPENDENT_AMBULATORY_CARE_PROVIDER_SITE_OTHER): Payer: Medicare HMO | Admitting: Psychiatry

## 2019-03-20 DIAGNOSIS — F4010 Social phobia, unspecified: Secondary | ICD-10-CM | POA: Diagnosis not present

## 2019-03-20 DIAGNOSIS — F900 Attention-deficit hyperactivity disorder, predominantly inattentive type: Secondary | ICD-10-CM | POA: Diagnosis not present

## 2019-03-20 DIAGNOSIS — F332 Major depressive disorder, recurrent severe without psychotic features: Secondary | ICD-10-CM | POA: Diagnosis not present

## 2019-03-20 MED ORDER — FLUOXETINE HCL 40 MG PO CAPS: 40.0000 mg | ORAL_CAPSULE | Freq: Every day | ORAL | 2 refills | Status: DC

## 2019-03-20 MED ORDER — VENLAFAXINE HCL ER 75 MG PO CP24: 75.0000 mg | ORAL_CAPSULE | Freq: Every day | ORAL | 2 refills | Status: DC

## 2019-03-20 MED ORDER — AMPHETAMINE-DEXTROAMPHETAMINE 10 MG PO TABS: ORAL_TABLET | ORAL | 0 refills | Status: DC

## 2019-03-20 MED ORDER — BUSPIRONE HCL 30 MG PO TABS: 30.0000 mg | ORAL_TABLET | Freq: Two times a day (BID) | ORAL | 2 refills | Status: DC

## 2019-03-20 MED ORDER — QUETIAPINE FUMARATE 100 MG PO TABS: 100.0000 mg | ORAL_TABLET | Freq: Every day | ORAL | 2 refills | Status: DC

## 2019-03-20 NOTE — Telephone Encounter (Signed)
Patient is calling for her refill on Adderall, [facility] on [id number]

## 2019-03-20 NOTE — Progress Notes (Signed)
Virtual Visit via Telephone Note  I connected with Rebecca Andrade on 03/20/19 at 10:40 AM EDT by telephone and verified that I am speaking with the correct person using two identifiers.   I discussed the limitations, risks, security and privacy concerns of performing an evaluation and management service by telephone and the availability of in person appointments. I also discussed with the patient that there may be a patient responsible charge related to this service. The patient expressed understanding and agreed to proceed.   History of Present Illness: Patient is evaluated by phone session.  She is taking her medication as prescribed.  Since taking the Effexor few months ago she noticed her anxiety is better.  On her last visit we reduce Prozac from 60 to 40 mg and increase Effexor to 75 mg.  She is tolerating well.  She does go outside sometimes but lately she is staying home most of the time due to COVID-19.  Sometimes she sits outside in her backyard.  She is happy that her son is doing well.  She denies any crying spells, irritability or any feeling of hopelessness.  Her attention concentration is fair with the help of stimulant.  She does not abuse her medication.  Her energy level is okay.  She reported no tremors, shakes, palpitation.  She denies drinking or using any illegal substances.  She lives with her son who is very supportive.     Past Psychiatric History: Reviewed. H/O taking antidepressants since 1990. H/O inpatient twice, one in 2005 and other in 2014. H/O History of cocaine use, paranoia, hallucination, psychosis, mood swing, anger and mania. No h/o suicidal attempt.TriedZoloft, Paxil, Lexapro, Wellbutrin, lithium, Remeron, Topamax, Abilify, Pristiq, Effexor, amitriptyline, Xanax, temazepam, Valium and Cymbalta. PCP described Adderall for ADD.    Observations/Objective: Mental status examination done on the phone.  Patient described her mood fine.  Her speech is slow but  coherent.  Her thought process slow but logical.  Her attention and concentration is fair.  She denies any auditory or visual hallucination.  She denies any active or passive suicidal thoughts or homicidal thought.  There were no delusions, paranoia.  She is alert and oriented x3.  Her fund of knowledge is average.  Her cognition is okay.  She reported no tremors, shakes or any EPS.  Her insight and judgment is okay.  Assessment and Plan: Major depressive disorder, recurrent.  Social anxiety disorder.  Attention deficit disorder, inattentive type.  Patient is doing better with a combination of current medication.  Continue Effexor XR 75 mg daily and Effexor 40 mg daily.  Continue BuSpar 30 mg daily, Seroquel 100 mg at bedtime and Adderall 20 mg twice a day.  Discussed medication side effects and benefits.  Discussed stimulant abuse, dependency and withdrawal.  Recommended to call us back if is any question or any concern.  Follow-up in 3 months.    Follow Up Instructions:    I discussed the assessment and treatment plan with the patient. The patient was provided an opportunity to ask questions and all were answered. The patient agreed with the plan and demonstrated an understanding of the instructions.   The patient was advised to call back or seek an in-person evaluation if the symptoms worsen or if the condition fails to improve as anticipated.  I provided 20 minutes of non-face-to-face time during this encounter.   Cleotis Nipper, MD

## 2019-03-20 NOTE — Telephone Encounter (Signed)
[facility].

## 2019-04-04 ENCOUNTER — Ambulatory Visit (INDEPENDENT_AMBULATORY_CARE_PROVIDER_SITE_OTHER): Payer: Medicare HMO | Admitting: Family Medicine

## 2019-04-04 ENCOUNTER — Encounter: Payer: Self-pay | Admitting: Family Medicine

## 2019-04-04 ENCOUNTER — Other Ambulatory Visit: Payer: Self-pay

## 2019-04-04 VITALS — Wt 183.0 lb

## 2019-04-04 DIAGNOSIS — N898 Other specified noninflammatory disorders of vagina: Secondary | ICD-10-CM

## 2019-04-04 MED ORDER — FLUCONAZOLE 150 MG PO TABS: 150.0000 mg | ORAL_TABLET | Freq: Once | ORAL | 0 refills | Status: AC

## 2019-04-04 NOTE — Progress Notes (Signed)
   Subjective:  Documentation for virtual telephone encounter. States she does not have video capability.   The patient was located at home. 2 identifiers used.  The provider was located in the office. The patient did consent to this visit and is aware of possible charges through their insurance for this visit.  The other persons participating in this telemedicine service were none.    Patient ID: Rebecca Andrade, female    DOB: 26-Mar-1971, 48 y.o.   MRN: [id number]  HPI Chief Complaint  Patient presents with  . yeast infection    yeast infection- few days- week. discharge, itchy, odor   Complains of vaginal discharge with odor for the past week. States she has had a yeast infection in the past and it felt the same. Not having a lot of itching.  Denies fever, chills, abdominal pain, vomiting, diarrhea, urinary symptoms.  States she is not concerned about an STD.   She has used over-the-counter Monistat in the past and it did not work.  LMP: 2 months ago. Irregular  States she will take a home pregnancy test if needed.   Reviewed allergies, medications, past medical, surgical, family, and social history.    Review of Systems Pertinent positives and negatives in the history of present illness.     Objective:   Physical Exam Wt 183 lb (83 kg)   BMI 28.66 kg/m   Alert and oriented and in no acute distress.  Unable to perform physical exam due to this being a virtual visit.      Assessment & Plan:  Vaginal itching - Plan: fluconazole (DIFLUCAN) 150 MG tablet  Vaginal odor  Discussed limitations of a virtual visit. I will prescribe Diflucan.  She is aware that this is not safe in pregnancy.  States she will take pregnancy test to make sure before she takes the medication. Discussed that if her symptoms do not resolve that she will need to be seen for an evaluation to rule out BV or any other etiology.  Time spent on call was 10 minutes and in review of previous records  1 minutes total.  This virtual service is not related to other E/M service within previous 7 days.  99441 (5-66min) 09295 (11-72min) 74734 (21-65min)

## 2019-04-07 ENCOUNTER — Other Ambulatory Visit: Payer: Self-pay | Admitting: Family Medicine

## 2019-04-16 ENCOUNTER — Telehealth: Payer: Self-pay | Admitting: Neurology

## 2019-04-16 NOTE — Telephone Encounter (Signed)
Pt has called and r/s her f/u with NP Megan.  Pt states she is having terrible migraines, they are causing her to have vertigo. Pt states her entire body aches and she is experiencing nausea.

## 2019-04-16 NOTE — Telephone Encounter (Signed)
I reached out to the pt and recommended moving this appt to SS, NP scheduled for 04/17/19 at 9 15 am.  Pt agreed to a telephone visit with Clarise Cruz, NP.   Pt understands that although there may be some limitations with this type of visit, we will take all precautions to reduce any security or privacy concerns.  Pt understands that this will be treated like an in office visit and we will file with pt's insurance, and there may be a patient responsible charge related to this service.  Pt states she does not have a video capable device.   Pt confirmed best call back # is [phone].

## 2019-04-17 ENCOUNTER — Encounter: Payer: Self-pay | Admitting: Neurology

## 2019-04-17 ENCOUNTER — Other Ambulatory Visit: Payer: Self-pay

## 2019-04-17 ENCOUNTER — Ambulatory Visit (INDEPENDENT_AMBULATORY_CARE_PROVIDER_SITE_OTHER): Payer: Medicare HMO | Admitting: Neurology

## 2019-04-17 DIAGNOSIS — F332 Major depressive disorder, recurrent severe without psychotic features: Secondary | ICD-10-CM

## 2019-04-17 DIAGNOSIS — F4010 Social phobia, unspecified: Secondary | ICD-10-CM

## 2019-04-17 DIAGNOSIS — G43019 Migraine without aura, intractable, without status migrainosus: Secondary | ICD-10-CM | POA: Diagnosis not present

## 2019-04-17 MED ORDER — KETOROLAC TROMETHAMINE 10 MG PO TABS: 10.0000 mg | ORAL_TABLET | Freq: Four times a day (QID) | ORAL | 1 refills | Status: DC | PRN

## 2019-04-17 MED ORDER — AIMOVIG 70 MG/ML ~~LOC~~ SOAJ: 70.0000 mg | SUBCUTANEOUS | 5 refills | Status: DC

## 2019-04-17 MED ORDER — MECLIZINE HCL 25 MG PO TABS: ORAL_TABLET | ORAL | 3 refills | Status: DC

## 2019-04-17 NOTE — Progress Notes (Signed)
Virtual Visit via Telephone Note  I connected with Rebecca Andrade on 04/17/19 at  9:15 AM EDT by telephone and verified that I am speaking with the correct person using two identifiers.   I discussed the limitations, risks, security and privacy concerns of performing an evaluation and management service by telephone and the availability of in person appointments. I also discussed with the patient that there may be a patient responsible charge related to this service. The patient expressed understanding and agreed to proceed.   History of Present Illness: 04/17/2019 SS: Rebecca Andrade is a 48 year old female with history of frequent headaches.  She is currently taking Effexor XR 75 mg daily, meclizine as needed.  She reports she is having daily headache. She describes her headaches as occurring to the top of her head, spreads all over.  She reports photophobia, nausea, and dizziness with her headaches.  She has been taking meclizine with good benefit for the dizziness.  She identifies triggers as bright light or loud noise.  When she gets a headache she will take ibuprofen or Toradol, but she has ran out of Toradol.  She is under the care of a psychiatrist, she is currently taking Adderall, BuSpar, Prozac, Seroquel.  In the past she has not been able to take Topamax or Zonegran secondary to her history of kidney stones, beta-blocker has not been used because of her significant history of depression.  05/08/2018 Dr. Anne HahnWillis: Rebecca Andrade is a 48 year old right-handed white female with a history of frequent headaches.  The patient has migraine events associated with photophobia, phonophobia, nausea and vomiting.  Odors are bothersome to her during the headaches.  She may wake up with a headache, the headaches remain about every other day.  She also reports episodes of vertigo, she takes meclizine for this.  The vertigo may be associated with a migraine as well.  The patient has total body aches.  She has history  of significant psychotic depression, followed through psychiatry.  She is on Seroquel at night, she takes Prozac as well.  She takes Toradol if needed for the headache.  The patient has not been seen through this office in about a year.  She returns for an evaluation.  In the past, she has been on gabapentin, she has not been able to take Topamax or Zonegran secondary to the history of kidney stones.  She has not been able to take beta-blockers given the significant history of severe depression.  She cannot take amitriptyline or nortriptyline as this is a significant interaction with Seroquel. she has been tried on tizanidine without much benefit.  She returns to this office for an evaluation.   Observations/Objective: Telephone call, is alert, answers questions appropriately  Assessment and Plan: 1. Migraines   She reports daily headaches. She will start Aimovig monthly injection 70 mg.  We discussed side effects of the medication.  She will continue Effexor XR 75 mg daily, Toradol 10 mg as needed, meclizine as needed for dizziness associated with migraine.  She will follow-up in 3 to 4 months.   Follow Up Instructions: 3-4 months, 08/18/2019 9:45 am    I discussed the assessment and treatment plan with the patient. The patient was provided an opportunity to ask questions and all were answered. The patient agreed with the plan and demonstrated an understanding of the instructions.   The patient was advised to call back or seek an in-person evaluation if the symptoms worsen or if the condition fails to  improve as anticipated.  I provided 25 minutes of non-face-to-face time during this encounter.  Evangeline Dakin, DNP  [facility] [address], [address], [place] [address] [phone]

## 2019-04-17 NOTE — Progress Notes (Signed)
I have read the note, and I agree with the clinical assessment and plan.  Charles K Willis   

## 2019-04-24 ENCOUNTER — Telehealth: Payer: Self-pay

## 2019-04-24 NOTE — Telephone Encounter (Signed)
Pending approval for Aimovig has been faxed to [facility] at [phone]. Confirmation fax has been received.

## 2019-04-29 ENCOUNTER — Telehealth: Payer: Self-pay | Admitting: Neurology

## 2019-04-29 NOTE — Telephone Encounter (Signed)
Received a denial letter from [facility] for Aimovig 70 mg injection.  Please try to appeal this.  My last visit with her in June 2020, she reported she was having DAILY headache.  She has tried several other medications for headaches including, Effexor,  Toradol, Seroquel, tizanidine.  She is not a candidate for Topamax or Zonegran due to her history of kidney stones, beta-blocker was not considered because of her significant history of depression.  Nortriptyline/amitriptyline could not be used because of the risk of interaction with Seroquel.  If this is denied, we could consider Environmental manager.

## 2019-04-30 NOTE — Telephone Encounter (Signed)
Appeal has been submitted via covermymeds. Confirmation [id number]. We should have answer within 5 days.

## 2019-05-05 MED ORDER — PROMETHAZINE HCL 50 MG PO TABS: 50.0000 mg | ORAL_TABLET | Freq: Three times a day (TID) | ORAL | 0 refills | Status: DC | PRN

## 2019-05-05 NOTE — Addendum Note (Signed)
Addended by: Suzzanne Cloud on: 05/05/2019 02:34 PM   Modules accepted: Orders

## 2019-05-05 NOTE — Telephone Encounter (Signed)
I called the patient. We discussed the medication Depakote for migraines. She is fearful of side effects related to the medication. Is hesitant to start medication. Can we provide response to [facility]? I will refill her phenergan to take as needed for n/v with migraine.

## 2019-05-05 NOTE — Telephone Encounter (Signed)
Review of the record indicates in May 2018, she was offered Depakote and she refused. Could have been due to fear of side effects?

## 2019-05-05 NOTE — Telephone Encounter (Signed)
Spoke to pt and she is willing to try the divalproex if not contraindicated to her migraines.  She also wanted to know about getting prescription for phernergan for n/v due to her migraines?

## 2019-05-05 NOTE — Telephone Encounter (Signed)
Sure, I have a paper questionaire for this.  Does pt feel she would have detrimental effects?

## 2019-05-05 NOTE — Telephone Encounter (Signed)
Received from [facility] re: aimovig redetermination.  Asking about divalproex? Would this be consideration?  If not why not?   Response needed by 7-1-20P. [phone]. Fax [phone].

## 2019-05-05 NOTE — Telephone Encounter (Addendum)
Fax confirmation to [phone] not received (failed).    (relating to pt not wanting to take divalproex due to SE).  Received determination approval from [facility] for aimovig 70mg  /ml.  05-05-19 thru 11/06/19.  ID [id number], ref # [id number].  [phone].

## 2019-05-05 NOTE — Telephone Encounter (Signed)
Yes, she feels she would not tolerate the medication well due to side effects.

## 2019-05-06 NOTE — Telephone Encounter (Signed)
Fax confirmation received [facility] [address] [phone]. Pt notified.

## 2019-05-12 ENCOUNTER — Telehealth: Payer: Self-pay

## 2019-05-12 NOTE — Telephone Encounter (Signed)
Pt called in to report she is uncomfortable with taking the aimovig injections and wanted to know if SS, NP could recommend any alternatives? Pt states she does not feel comfortable injecting herself. I offered a meeting with a nurse to go over injection education but pt refused. Pt best call back # is [phone].

## 2019-05-12 NOTE — Telephone Encounter (Signed)
I think Aimovig would be her best option given her reported DAILY headache frequency.  She has tried several other medications for headaches including, Effexor,  Toradol, Seroquel, tizanidine.  She is not a candidate for Topamax or Zonegran due to her history of kidney stones, beta-blocker was not considered because of her significant history of depression. Nortriptyline/amitriptyline could not be used because of the risk of interaction with Seroquel.   I agree, with offering to meet with the nurse. Please offer this to her again.

## 2019-05-13 NOTE — Telephone Encounter (Signed)
I called pt and relayed to her below that Aimovig best option for her.  I relayed to her information from Judson Roch, NP below.  Pt had declined at pharmacy.  [facility] called [facility] [address] and LMVM to go ahead and fill for pt.  I explained that  We can instruct her on how to do this if needed after she gets it from pharmacy.  This is a once month injection.  (self injects).  She will try and let us know.

## 2019-05-19 ENCOUNTER — Other Ambulatory Visit (HOSPITAL_COMMUNITY): Payer: Self-pay | Admitting: Psychiatry

## 2019-05-19 ENCOUNTER — Telehealth (HOSPITAL_COMMUNITY): Payer: Self-pay

## 2019-05-19 DIAGNOSIS — F900 Attention-deficit hyperactivity disorder, predominantly inattentive type: Secondary | ICD-10-CM

## 2019-05-19 MED ORDER — AMPHETAMINE-DEXTROAMPHETAMINE 10 MG PO TABS: ORAL_TABLET | ORAL | 0 refills | Status: DC

## 2019-05-19 NOTE — Telephone Encounter (Signed)
This is a patient of Dr. Adele Schilder. Patient called requesting a refill on her Adderall 10mg  [facility] [address]. Please review and advise. Thank you.

## 2019-05-19 NOTE — Telephone Encounter (Signed)
Done

## 2019-05-24 ENCOUNTER — Emergency Department (HOSPITAL_COMMUNITY)
Admission: EM | Admit: 2019-05-24 | Discharge: 2019-05-24 | Disposition: A | Discharge status: Home / Self Care | Payer: Medicare HMO | Attending: Emergency Medicine | Admitting: Emergency Medicine

## 2019-05-24 ENCOUNTER — Encounter (HOSPITAL_COMMUNITY): Payer: Self-pay | Admitting: Emergency Medicine

## 2019-05-24 ENCOUNTER — Other Ambulatory Visit: Payer: Self-pay

## 2019-05-24 ENCOUNTER — Emergency Department (HOSPITAL_COMMUNITY): Payer: Medicare HMO

## 2019-05-24 DIAGNOSIS — Z20828 Contact with and (suspected) exposure to other viral communicable diseases: Secondary | ICD-10-CM | POA: Insufficient documentation

## 2019-05-24 DIAGNOSIS — R1031 Right lower quadrant pain: Secondary | ICD-10-CM | POA: Diagnosis present

## 2019-05-24 DIAGNOSIS — Z03818 Encounter for observation for suspected exposure to other biological agents ruled out: Secondary | ICD-10-CM | POA: Diagnosis not present

## 2019-05-24 DIAGNOSIS — N200 Calculus of kidney: Secondary | ICD-10-CM

## 2019-05-24 DIAGNOSIS — N132 Hydronephrosis with renal and ureteral calculous obstruction: Secondary | ICD-10-CM | POA: Diagnosis not present

## 2019-05-24 DIAGNOSIS — R109 Unspecified abdominal pain: Secondary | ICD-10-CM

## 2019-05-24 DIAGNOSIS — N189 Chronic kidney disease, unspecified: Secondary | ICD-10-CM | POA: Diagnosis not present

## 2019-05-24 DIAGNOSIS — N201 Calculus of ureter: Secondary | ICD-10-CM | POA: Insufficient documentation

## 2019-05-24 DIAGNOSIS — Z960 Presence of urogenital implants: Secondary | ICD-10-CM | POA: Diagnosis not present

## 2019-05-24 LAB — BASIC METABOLIC PANEL
Anion gap: 12 (ref 5–15)
BUN: 21 mg/dL — ABNORMAL HIGH (ref 6–20)
CO2: 22 mmol/L (ref 22–32)
Calcium: 9.5 mg/dL (ref 8.9–10.3)
Chloride: 106 mmol/L (ref 98–111)
Creatinine, Ser: 0.75 mg/dL (ref 0.44–1.00)
GFR calc Af Amer: >60 mL/min (ref >60)
GFR calc non Af Amer: >60 mL/min (ref >60)
Glucose, Bld: 101 mg/dL — ABNORMAL HIGH (ref 70–99)
Potassium: 4 mmol/L (ref 3.5–5.1)
Sodium: 140 mmol/L (ref 135–145)

## 2019-05-24 LAB — URINALYSIS, ROUTINE W REFLEX MICROSCOPIC
Bilirubin Urine: NEGATIVE
Glucose, UA: NEGATIVE mg/dL
Hgb urine dipstick: NEGATIVE
Ketones, ur: NEGATIVE mg/dL
Nitrite: NEGATIVE
Protein, ur: NEGATIVE mg/dL
Specific Gravity, Urine: 1.014 (ref 1.005–1.030)
pH: 5 (ref 5.0–8.0)

## 2019-05-24 LAB — I-STAT BETA HCG BLOOD, ED (MC, WL, AP ONLY): I-stat hCG, quantitative: <5 m[IU]/mL (ref <5)

## 2019-05-24 LAB — CBC
HCT: 46.7 % — ABNORMAL HIGH (ref 36.0–46.0)
Hemoglobin: 15.2 g/dL — ABNORMAL HIGH (ref 12.0–15.0)
MCH: 30.3 pg (ref 26.0–34.0)
MCHC: 32.5 g/dL (ref 30.0–36.0)
MCV: 93.2 fL (ref 80.0–100.0)
Platelets: 288 10*3/uL (ref 150–400)
RBC: 5.01 MIL/uL (ref 3.87–5.11)
RDW: 12.8 % (ref 11.5–15.5)
WBC: 10.9 10*3/uL — ABNORMAL HIGH (ref 4.0–10.5)
nRBC: 0 % (ref 0.0–0.2)

## 2019-05-24 LAB — SARS CORONAVIRUS 2 BY RT PCR (HOSPITAL ORDER, PERFORMED IN ~~LOC~~ HOSPITAL LAB): SARS Coronavirus 2: NEGATIVE

## 2019-05-24 LAB — LIPASE, BLOOD: Lipase: 52 U/L — ABNORMAL HIGH (ref 11–51)

## 2019-05-24 MED ORDER — ONDANSETRON HCL 4 MG/2ML IJ SOLN
4.0000 mg | Freq: Once | INTRAMUSCULAR | Status: AC
  Administered 2019-05-24: 4 mg via INTRAVENOUS
  Filled 2019-05-24: qty 2

## 2019-05-24 MED ORDER — ONDANSETRON 4 MG PO TBDP: 4.0000 mg | ORAL_TABLET | Freq: Three times a day (TID) | ORAL | 0 refills | Status: DC | PRN

## 2019-05-24 MED ORDER — MORPHINE SULFATE (PF) 4 MG/ML IV SOLN
4.0000 mg | Freq: Once | INTRAVENOUS | Status: AC
  Administered 2019-05-24: 4 mg via INTRAVENOUS
  Filled 2019-05-24: qty 1

## 2019-05-24 MED ORDER — IOHEXOL 300 MG/ML  SOLN
100.0000 mL | Freq: Once | INTRAMUSCULAR | Status: AC | PRN
  Administered 2019-05-24: 100 mL via INTRAVENOUS

## 2019-05-24 MED ORDER — HYDROMORPHONE HCL 1 MG/ML IJ SOLN
0.5000 mg | Freq: Once | INTRAMUSCULAR | Status: AC
  Administered 2019-05-24: 0.5 mg via INTRAVENOUS
  Filled 2019-05-24: qty 1

## 2019-05-24 MED ORDER — TAMSULOSIN HCL 0.4 MG PO CAPS: 0.4000 mg | ORAL_CAPSULE | Freq: Every day | ORAL | 0 refills | Status: DC

## 2019-05-24 MED ORDER — OXYCODONE-ACETAMINOPHEN 10-325 MG PO TABS: 1.0000 | ORAL_TABLET | Freq: Four times a day (QID) | ORAL | 0 refills | Status: DC | PRN

## 2019-05-24 MED ORDER — SODIUM CHLORIDE 0.9 % IV BOLUS
1000.0000 mL | Freq: Once | INTRAVENOUS | Status: AC
  Administered 2019-05-24: 1000 mL via INTRAVENOUS

## 2019-05-24 MED ORDER — KETOROLAC TROMETHAMINE 30 MG/ML IJ SOLN
30.0000 mg | Freq: Once | INTRAMUSCULAR | Status: AC
  Administered 2019-05-24: 30 mg via INTRAVENOUS
  Filled 2019-05-24: qty 1

## 2019-05-24 MED ORDER — SODIUM CHLORIDE (PF) 0.9 % IJ SOLN
INTRAMUSCULAR | Status: AC
  Filled 2019-05-24: qty 50
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
## 2019-05-24 NOTE — Discharge Instructions (Addendum)
You were diagnosed with a kidney stone today.  Kidney stones (ureteral lithiasis) are solid masses that form inside your kidneys. The intense pain is caused by the stone moving through the kidney, ureter, bladder, and urethra (urinary tract). When the stone moves, the ureter starts to spasm around the stone.   We are sending you home with multiple medications:   -Flomax-this is a medication to help pass the stone, it allows urine to exit the body more freely.  Please take this once daily with a meal.  -Percocet-this is a narcotic/controlled substance medication that has potential addicting qualities.  We recommend that you take 1 tablets every 6 hours as needed for severe pain.  Do not drive or operate heavy machinery when taking this medicine as it can be sedating. Do not drink alcohol or take other sedating medications when taking this medicine for safety reasons.  Keep this out of reach of small children.  Please be aware this medicine has Tylenol in it (325 mg/tab) do not exceed the maximum dose of Tylenol in a day per over the counter recommendations should you decide to supplement with Tylenol over the counter.   -Zofran-this is an antinausea medication, you may take this every 8 hours as needed for nausea and vomiting, please allow the tablet to dissolve underneath of your tongue.   We have prescribed you new medication(s) today. Discuss the medications prescribed today with your pharmacist as they can have adverse effects and interactions with your other medicines including over the counter and prescribed medications. Seek medical evaluation if you start to experience new or abnormal symptoms after taking one of these medicines, seek care immediately if you start to experience difficulty breathing, feeling of your throat closing, facial swelling, or rash as these could be indications of a more serious allergic reaction.  You have been tested for coronavirus in the emergency department given your  planned procedure for lithotripsy on Monday with urology.  Please follow-up as scheduled.  Return to the ER for new or worsening symptoms including but not limited to uncontrollable pain, inability to keep fluids down, fever, or any other concerns.

## 2019-05-24 NOTE — ED Triage Notes (Signed)
Pt with R flank pain, Pt states urine is scant and unable to urine after this morning.

## 2019-05-24 NOTE — ED Provider Notes (Signed)
17:00: Assumed care of patient from Darlin Drop PA-C at change of shift pending urology evaluation & recommendations.   Please see prior provider note for full H&P. Briefly patient is a 48 year old female who presented to the emergency department with right-sided abdominal pain/flank pain that began this morning.  CT scan revealed obstructing 5 x 8 mm calculus in the proximal to mid right ureter as well as additional intra-renal stones.   Patient ultimately would like to go home. Urology was consulted, evaluated patient in the ED, plan for discharge home with 10 tablets of 10-325 Percocets, will perform lithotripsy this Monday, requesting covid swab be obtained prior to DC.  Jodi S Neer was evaluated in Emergency Department on 05/24/2019 for the symptoms described in the history of present illness. He/she was evaluated in the context of the global COVID-19 pandemic, which necessitated consideration that the patient might be at risk for infection with the SARS-CoV-2 virus that causes COVID-19. Institutional protocols and algorithms that pertain to the evaluation of patients at risk for COVID-19 are in a state of rapid change based on information released by regulatory bodies including the CDC and federal and state organizations. These policies and algorithms were followed during the patient's care in the ED.  Plan carried out as discussed. [place] Controlled Substance reporting System queried.  I discussed results, treatment plan, need for follow-up, and return precautions with the patient. Provided opportunity for questions, patient confirmed understanding and is in agreement with plan.     Leafy Kindle 05/24/19 Darcus Austin, MD 05/24/19 [id number]
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
## 2019-05-24 NOTE — ED Notes (Signed)
Patient transported to CT 

## 2019-05-26 ENCOUNTER — Observation Stay (HOSPITAL_COMMUNITY)
Admission: AD | Admit: 2019-05-26 | Discharge: 2019-05-27 | Disposition: A | Discharge status: Home / Self Care | Payer: Medicare HMO | Attending: Urology | Admitting: Urology

## 2019-05-26 ENCOUNTER — Ambulatory Visit (HOSPITAL_COMMUNITY): Payer: Medicare HMO

## 2019-05-26 ENCOUNTER — Observation Stay (HOSPITAL_COMMUNITY): Payer: Medicare HMO

## 2019-05-26 ENCOUNTER — Observation Stay (HOSPITAL_COMMUNITY): Payer: Medicare HMO | Admitting: Certified Registered"

## 2019-05-26 ENCOUNTER — Encounter (HOSPITAL_COMMUNITY)
Admission: AD | Disposition: A | Discharge status: Home / Self Care | Payer: Self-pay | Source: Home / Self Care | Attending: Urology

## 2019-05-26 ENCOUNTER — Other Ambulatory Visit: Payer: Self-pay | Admitting: Urology

## 2019-05-26 ENCOUNTER — Encounter (HOSPITAL_COMMUNITY): Payer: Self-pay | Admitting: *Deleted

## 2019-05-26 ENCOUNTER — Other Ambulatory Visit: Payer: Self-pay

## 2019-05-26 DIAGNOSIS — F332 Major depressive disorder, recurrent severe without psychotic features: Secondary | ICD-10-CM | POA: Diagnosis not present

## 2019-05-26 DIAGNOSIS — N136 Pyonephrosis: Principal | ICD-10-CM | POA: Insufficient documentation

## 2019-05-26 DIAGNOSIS — Z79899 Other long term (current) drug therapy: Secondary | ICD-10-CM | POA: Insufficient documentation

## 2019-05-26 DIAGNOSIS — G43919 Migraine, unspecified, intractable, without status migrainosus: Secondary | ICD-10-CM | POA: Insufficient documentation

## 2019-05-26 DIAGNOSIS — N201 Calculus of ureter: Secondary | ICD-10-CM

## 2019-05-26 DIAGNOSIS — N3080 Other cystitis without hematuria: Secondary | ICD-10-CM | POA: Insufficient documentation

## 2019-05-26 DIAGNOSIS — F419 Anxiety disorder, unspecified: Secondary | ICD-10-CM | POA: Diagnosis not present

## 2019-05-26 DIAGNOSIS — F988 Other specified behavioral and emotional disorders with onset usually occurring in childhood and adolescence: Secondary | ICD-10-CM | POA: Insufficient documentation

## 2019-05-26 DIAGNOSIS — B9620 Unspecified Escherichia coli [E. coli] as the cause of diseases classified elsewhere: Secondary | ICD-10-CM | POA: Diagnosis not present

## 2019-05-26 DIAGNOSIS — N202 Calculus of kidney with calculus of ureter: Secondary | ICD-10-CM | POA: Diagnosis not present

## 2019-05-26 DIAGNOSIS — I700 Atherosclerosis of aorta: Secondary | ICD-10-CM | POA: Diagnosis not present

## 2019-05-26 DIAGNOSIS — N200 Calculus of kidney: Secondary | ICD-10-CM

## 2019-05-26 DIAGNOSIS — N132 Hydronephrosis with renal and ureteral calculous obstruction: Secondary | ICD-10-CM | POA: Diagnosis not present

## 2019-05-26 DIAGNOSIS — H524 Presbyopia: Secondary | ICD-10-CM | POA: Diagnosis not present

## 2019-05-26 DIAGNOSIS — F418 Other specified anxiety disorders: Secondary | ICD-10-CM | POA: Diagnosis not present

## 2019-05-26 DIAGNOSIS — R509 Fever, unspecified: Secondary | ICD-10-CM | POA: Diagnosis not present

## 2019-05-26 DIAGNOSIS — N390 Urinary tract infection, site not specified: Secondary | ICD-10-CM | POA: Diagnosis not present

## 2019-05-26 DIAGNOSIS — N189 Chronic kidney disease, unspecified: Secondary | ICD-10-CM | POA: Diagnosis not present

## 2019-05-26 HISTORY — PX: CYSTOSCOPY W/ URETERAL STENT PLACEMENT: SHX1429

## 2019-05-26 LAB — CBC
HCT: 44.8 % (ref 36.0–46.0)
Hemoglobin: 13.6 g/dL (ref 12.0–15.0)
MCH: 29.4 pg (ref 26.0–34.0)
MCHC: 30.4 g/dL (ref 30.0–36.0)
MCV: 97 fL (ref 80.0–100.0)
Platelets: 243 10*3/uL (ref 150–400)
RBC: 4.62 MIL/uL (ref 3.87–5.11)
RDW: 12.8 % (ref 11.5–15.5)
WBC: 11.3 10*3/uL — ABNORMAL HIGH (ref 4.0–10.5)
nRBC: 0 % (ref 0.0–0.2)

## 2019-05-26 LAB — BASIC METABOLIC PANEL
Anion gap: 11 (ref 5–15)
BUN: 12 mg/dL (ref 6–20)
CO2: 24 mmol/L (ref 22–32)
Calcium: 8.6 mg/dL — ABNORMAL LOW (ref 8.9–10.3)
Chloride: 106 mmol/L (ref 98–111)
Creatinine, Ser: 0.77 mg/dL (ref 0.44–1.00)
GFR calc Af Amer: >60 mL/min (ref >60)
GFR calc non Af Amer: >60 mL/min (ref >60)
Glucose, Bld: 131 mg/dL — ABNORMAL HIGH (ref 70–99)
Potassium: 3.7 mmol/L (ref 3.5–5.1)
Sodium: 141 mmol/L (ref 135–145)

## 2019-05-26 SURGERY — CYSTOSCOPY, WITH RETROGRADE PYELOGRAM AND URETERAL STENT INSERTION
Anesthesia: General | Site: Ureter | Laterality: Right

## 2019-05-26 MED ORDER — SODIUM CHLORIDE 0.9 % IV SOLN
INTRAVENOUS | Status: DC
  Administered 2019-05-26: 17:00:00 via INTRAVENOUS

## 2019-05-26 MED ORDER — GENTAMICIN SULFATE 40 MG/ML IJ SOLN
5.0000 mg/kg | INTRAVENOUS | Status: AC
  Administered 2019-05-26: 352 mg via INTRAVENOUS
  Filled 2019-05-26: qty 8.75

## 2019-05-26 MED ORDER — FENTANYL CITRATE (PF) 100 MCG/2ML IJ SOLN
25.0000 ug | INTRAMUSCULAR | Status: DC | PRN
  Administered 2019-05-26 (×4): 25 ug via INTRAVENOUS

## 2019-05-26 MED ORDER — LIDOCAINE 2% (20 MG/ML) 5 ML SYRINGE
INTRAMUSCULAR | Status: DC | PRN
  Administered 2019-05-26: 60 mg via INTRAVENOUS

## 2019-05-26 MED ORDER — LIDOCAINE HCL URETHRAL/MUCOSAL 2 % EX GEL
CUTANEOUS | Status: DC | PRN
  Administered 2019-05-26: 1 via URETHRAL

## 2019-05-26 MED ORDER — DEXAMETHASONE SODIUM PHOSPHATE 10 MG/ML IJ SOLN
INTRAMUSCULAR | Status: DC | PRN
  Administered 2019-05-26: 10 mg via INTRAVENOUS

## 2019-05-26 MED ORDER — FENTANYL CITRATE (PF) 100 MCG/2ML IJ SOLN
25.0000 ug | INTRAMUSCULAR | Status: DC | PRN
  Administered 2019-05-26: 25 ug via INTRAVENOUS
  Filled 2019-05-26: qty 2

## 2019-05-26 MED ORDER — LIDOCAINE HCL URETHRAL/MUCOSAL 2 % EX GEL
CUTANEOUS | Status: AC
  Filled 2019-05-26: qty 5

## 2019-05-26 MED ORDER — BELLADONNA ALKALOIDS-OPIUM 16.2-60 MG RE SUPP
RECTAL | Status: DC | PRN
  Administered 2019-05-26: 1 via RECTAL

## 2019-05-26 MED ORDER — LACTATED RINGERS IV SOLN
INTRAVENOUS | Status: DC | PRN
  Administered 2019-05-26: 18:00:00 via INTRAVENOUS

## 2019-05-26 MED ORDER — PROPOFOL 10 MG/ML IV BOLUS
INTRAVENOUS | Status: AC
  Filled 2019-05-26: qty 20

## 2019-05-26 MED ORDER — ONDANSETRON HCL 4 MG/2ML IJ SOLN
INTRAMUSCULAR | Status: DC | PRN
  Administered 2019-05-26: 4 mg via INTRAVENOUS

## 2019-05-26 MED ORDER — GENTAMICIN SULFATE 40 MG/ML IJ SOLN: 240.0000 mg | Freq: Once | INTRAVENOUS | Status: DC

## 2019-05-26 MED ORDER — MIDAZOLAM HCL 2 MG/2ML IJ SOLN
INTRAMUSCULAR | Status: DC | PRN
  Administered 2019-05-26: 2 mg via INTRAVENOUS

## 2019-05-26 MED ORDER — LIDOCAINE 2% (20 MG/ML) 5 ML SYRINGE
INTRAMUSCULAR | Status: AC
  Filled 2019-05-26: qty 5

## 2019-05-26 MED ORDER — SODIUM CHLORIDE 0.9 % IR SOLN
Status: DC | PRN
  Administered 2019-05-26: 3000 mL

## 2019-05-26 MED ORDER — TAMSULOSIN HCL 0.4 MG PO CAPS: 0.4000 mg | ORAL_CAPSULE | Freq: Every day | ORAL | Status: DC

## 2019-05-26 MED ORDER — DIPHENHYDRAMINE HCL 25 MG PO CAPS
25.0000 mg | ORAL_CAPSULE | ORAL | Status: AC
  Administered 2019-05-26: 25 mg via ORAL
  Filled 2019-05-26: qty 1

## 2019-05-26 MED ORDER — OXYCODONE HCL 5 MG PO TABS
5.0000 mg | ORAL_TABLET | Freq: Four times a day (QID) | ORAL | Status: DC | PRN
  Administered 2019-05-27: 5 mg via ORAL
  Filled 2019-05-26: qty 1

## 2019-05-26 MED ORDER — ONDANSETRON HCL 4 MG/2ML IJ SOLN: 4.0000 mg | Freq: Four times a day (QID) | INTRAMUSCULAR | Status: DC | PRN

## 2019-05-26 MED ORDER — FENTANYL CITRATE (PF) 100 MCG/2ML IJ SOLN
INTRAMUSCULAR | Status: AC
  Filled 2019-05-26: qty 2

## 2019-05-26 MED ORDER — FLUOXETINE HCL 20 MG PO CAPS
40.0000 mg | ORAL_CAPSULE | Freq: Every day | ORAL | Status: DC
  Filled 2019-05-26: qty 2

## 2019-05-26 MED ORDER — PROPOFOL 10 MG/ML IV BOLUS
INTRAVENOUS | Status: DC | PRN
  Administered 2019-05-26: 200 mg via INTRAVENOUS

## 2019-05-26 MED ORDER — ONDANSETRON 4 MG PO TBDP: 4.0000 mg | ORAL_TABLET | Freq: Three times a day (TID) | ORAL | Status: DC | PRN

## 2019-05-26 MED ORDER — BELLADONNA ALKALOIDS-OPIUM 16.2-30 MG RE SUPP
RECTAL | Status: AC
  Filled 2019-05-26: qty 1

## 2019-05-26 MED ORDER — HYDROMORPHONE HCL 1 MG/ML IJ SOLN
0.5000 mg | INTRAMUSCULAR | Status: DC | PRN
  Administered 2019-05-26: 0.5 mg via INTRAVENOUS
  Filled 2019-05-26: qty 0.5

## 2019-05-26 MED ORDER — DEXAMETHASONE SODIUM PHOSPHATE 10 MG/ML IJ SOLN
INTRAMUSCULAR | Status: AC
  Filled 2019-05-26: qty 1

## 2019-05-26 MED ORDER — LACTATED RINGERS IV SOLN
INTRAVENOUS | Status: DC
  Administered 2019-05-26 – 2019-05-27 (×2): via INTRAVENOUS

## 2019-05-26 MED ORDER — BUSPIRONE HCL 5 MG PO TABS
30.0000 mg | ORAL_TABLET | Freq: Two times a day (BID) | ORAL | Status: DC
  Filled 2019-05-26: qty 6

## 2019-05-26 MED ORDER — OXYCODONE-ACETAMINOPHEN 5-325 MG PO TABS
1.0000 | ORAL_TABLET | Freq: Four times a day (QID) | ORAL | Status: DC | PRN
  Administered 2019-05-27: 1 via ORAL
  Filled 2019-05-26: qty 1

## 2019-05-26 MED ORDER — QUETIAPINE FUMARATE 100 MG PO TABS
100.0000 mg | ORAL_TABLET | Freq: Every day | ORAL | Status: DC
  Administered 2019-05-26: 100 mg via ORAL
  Filled 2019-05-26: qty 1

## 2019-05-26 MED ORDER — FENTANYL CITRATE (PF) 100 MCG/2ML IJ SOLN
INTRAMUSCULAR | Status: DC | PRN
  Administered 2019-05-26 (×4): 25 ug via INTRAVENOUS

## 2019-05-26 MED ORDER — CIPROFLOXACIN HCL 500 MG PO TABS
500.0000 mg | ORAL_TABLET | ORAL | Status: AC
  Administered 2019-05-26: 500 mg via ORAL
  Filled 2019-05-26: qty 1

## 2019-05-26 MED ORDER — OXYCODONE-ACETAMINOPHEN 10-325 MG PO TABS: 1.0000 | ORAL_TABLET | Freq: Four times a day (QID) | ORAL | Status: DC | PRN

## 2019-05-26 MED ORDER — CIPROFLOXACIN HCL 500 MG PO TABS
500.0000 mg | ORAL_TABLET | Freq: Two times a day (BID) | ORAL | Status: DC
  Administered 2019-05-27: 500 mg via ORAL
  Filled 2019-05-26: qty 1

## 2019-05-26 MED ORDER — DIAZEPAM 5 MG PO TABS
10.0000 mg | ORAL_TABLET | ORAL | Status: AC
  Administered 2019-05-26: 10 mg via ORAL
  Filled 2019-05-26: qty 2

## 2019-05-26 MED ORDER — MIDAZOLAM HCL 2 MG/2ML IJ SOLN
INTRAMUSCULAR | Status: AC
  Filled 2019-05-26: qty 2

## 2019-05-26 MED ORDER — ONDANSETRON 4 MG PO TBDP
4.0000 mg | ORAL_TABLET | Freq: Three times a day (TID) | ORAL | Status: DC | PRN
  Administered 2019-05-27: 4 mg via ORAL
  Filled 2019-05-26: qty 1

## 2019-05-26 MED ORDER — ONDANSETRON HCL 4 MG/2ML IJ SOLN
INTRAMUSCULAR | Status: AC
  Filled 2019-05-26: qty 2

## 2019-05-26 MED ORDER — VENLAFAXINE HCL ER 75 MG PO CP24
75.0000 mg | ORAL_CAPSULE | Freq: Every day | ORAL | Status: DC
  Filled 2019-05-26: qty 1

## 2019-05-26 MED ORDER — IOHEXOL 300 MG/ML  SOLN
INTRAMUSCULAR | Status: DC | PRN
  Administered 2019-05-26: 50 mL

## 2019-05-26 MED ORDER — PROMETHAZINE HCL 25 MG PO TABS: 50.0000 mg | ORAL_TABLET | Freq: Three times a day (TID) | ORAL | Status: DC | PRN

## 2019-05-26 SURGICAL SUPPLY — 4 items
CATH URET 5FR 28IN OPEN ENDED (CATHETERS) ×1 IMPLANT
GUIDEWIRE STR DUAL SENSOR (WIRE) ×1 IMPLANT
PACK CYSTO (CUSTOM PROCEDURE TRAY) ×1 IMPLANT
STENT URET 6FRX24 CONTOUR (STENTS) ×1 IMPLANT

## 2019-05-26 NOTE — Transfer of Care (Signed)
Immediate Anesthesia Transfer of Care Note  Patient: Rebecca Andrade  Procedure(s) Performed: CYSTOSCOPY WITH RETROGRADE PYELOGRAM/URETERAL STENT PLACEMENT (Right Ureter)  Patient Location: PACU  Anesthesia Type:General  Level of Consciousness: drowsy  Airway & Oxygen Therapy: Patient Spontanous Breathing and Patient connected to face mask  Post-op Assessment: Report given to RN and Post -op Vital signs reviewed and stable  Post vital signs: Reviewed and stable  Last Vitals:  Vitals Value Taken Time  BP 107/61 05/26/19 1834  Temp 37.4 C 05/26/19 1834  Pulse 98 05/26/19 1842  Resp 21 05/26/19 1842  SpO2 96 % 05/26/19 1842  Vitals shown include unvalidated device data.  Last Pain:  Vitals:   05/26/19 1834  TempSrc:   PainSc: Asleep      Patients Stated Pain Goal: 4 (82/42/35 3614)  Complications: No apparent anesthesia complications

## 2019-05-26 NOTE — Progress Notes (Signed)
Paged Dr Louis Meckel to get order for pain medication for patient.

## 2019-05-26 NOTE — Anesthesia Procedure Notes (Signed)
Procedure Name: LMA Insertion Date/Time: 05/26/2019 6:07 PM Performed by: Niel Hummer, CRNA Pre-anesthesia Checklist: Patient being monitored, Suction available, Emergency Drugs available and Patient identified Patient Re-evaluated:Patient Re-evaluated prior to induction Oxygen Delivery Method: Circle system utilized Preoxygenation: Pre-oxygenation with 100% oxygen Induction Type: IV induction LMA: LMA inserted LMA Size: 4.0 Dental Injury: Teeth and Oropharynx as per pre-operative assessment

## 2019-05-26 NOTE — Interval H&P Note (Signed)
History and Physical Interval Note: The patient presented today for shockwave lithotripsy and was noted to have an E. coli urinary tract infection, on results that were obtained only slightly before the patient was to have her procedure.  This is a culture that was performed in the emergency department 48 hours prior.  Further, the patient was noted to have a fever of 101.2.  As such, we canceled her lithotripsy, and instead have scheduled her for a right ureteral stent placement.  Following the stent placement the patient will be admitted for 24-hour observation.  05/26/2019 5:30 PM  Rebecca Andrade  has presented today for surgery, with the diagnosis of right ureteral stone.  The various methods of treatment have been discussed with the patient and family. After consideration of risks, benefits and other options for treatment, the patient has consented to  Procedure(s): EXTRACORPOREAL SHOCK WAVE LITHOTRIPSY (ESWL) (Right) as a surgical intervention.  The patient's history has been reviewed, patient examined, no change in status, stable for surgery.  I have reviewed the patient's chart and labs.  Questions were answered to the patient's satisfaction.     [name]

## 2019-05-26 NOTE — Progress Notes (Signed)
Lithotripsy cancelled. Patient has E coli in urine and elevated temperature. Dr Louis Meckel to place stent tonight. Patient's wedding ring to be given to her husband.

## 2019-05-26 NOTE — Anesthesia Preprocedure Evaluation (Addendum)
Anesthesia Evaluation  Patient identified by MRN, date of birth, ID band Patient awake    Reviewed: Allergy & Precautions, NPO status , Patient's Chart, lab work & pertinent test results  History of Anesthesia Complications (+) PONV  Airway Mallampati: III  TM Distance: >3 FB Neck ROM: Full  Mouth opening: Limited Mouth Opening  Dental no notable dental hx. (+) Partial Upper,    Pulmonary neg pulmonary ROS,    Pulmonary exam normal breath sounds clear to auscultation       Cardiovascular negative cardio ROS Normal cardiovascular exam Rhythm:Regular Rate:Normal     Neuro/Psych  Headaches, PSYCHIATRIC DISORDERS Anxiety Depression    GI/Hepatic negative GI ROS, (+)     substance abuse  cocaine use,   Endo/Other  negative endocrine ROS  Renal/GU Renal diseaseKidney stone  negative genitourinary   Musculoskeletal negative musculoskeletal ROS (+)   Abdominal   Peds  (+) ADHD Hematology negative hematology ROS (+)   Anesthesia Other Findings   Reproductive/Obstetrics                            Anesthesia Physical Anesthesia Plan  ASA: II  Anesthesia Plan: General   Post-op Pain Management:    Induction: Intravenous  PONV Risk Score and Plan: 4 or greater and Ondansetron, Dexamethasone, Midazolam and Treatment may vary due to age or medical condition  Airway Management Planned: LMA  Additional Equipment:   Intra-op Plan:   Post-operative Plan: Extubation in OR  Informed Consent: I have reviewed the patients History and Physical, chart, labs and discussed the procedure including the risks, benefits and alternatives for the proposed anesthesia with the patient or authorized representative who has indicated his/her understanding and acceptance.     Dental advisory given  Plan Discussed with: CRNA  Anesthesia Plan Comments:         Anesthesia Quick Evaluation

## 2019-05-26 NOTE — Op Note (Signed)
Preoperative diagnosis:  1. Urinary tract infection, obstructing right mid ureteral stone  Postoperative diagnosis:  1. Same  Procedure:  1. Cystoscopy 2. right ureteral stent placement 3. right retrograde pyelography with interpretation   Surgeon: [name], MD  Anesthesia: General  Complications: None  Intraoperative findings:  right retrograde pyelography demonstrated a filling defect within the right ureter consistent with the patient's known calculus without other abnormalities.  EBL: Minimal  Specimens: None  Stent: 24 cm x 6 French double-J right ureteral stent  Indication: Rebecca Andrade is a 48 y.o. patient with right mid ureteral stone.  In the emergency department she was evaluated and deemed safe for discharge with close follow-up.  We scheduled her for shockwave lithotripsy.  However, just prior to the procedure her urine culture returned as gram-negative rods.  She had not been treated for this prior.  Simultaneously which she was also noted to have spiked a fever.  As such, we canceled surgery and re-scheduled her for stent placement.  After reviewing the management options for treatment, he elected to proceed with the above surgical procedure(s). We have discussed the potential benefits and risks of the procedure, side effects of the proposed treatment, the likelihood of the patient achieving the goals of the procedure, and any potential problems that might occur during the procedure or recuperation. Informed consent has been obtained.  Description of procedure:  The patient was taken to the operating room and general anesthesia was induced.  The patient was placed in the dorsal lithotomy position, prepped and draped in the usual sterile fashion, and preoperative antibiotics were administered. A preoperative time-out was performed.   Cystourethroscopy was performed.  The patient's urethra was examined and was normal. The bladder was then systematically  examined in its entirety. There was no evidence for any bladder tumors, stones, or other mucosal pathology, but the patient did have evidence of cystitis cystica and some erythema within the bladder..    Attention then turned to the rightureteral orifice and a ureteral catheter was used to intubate the ureteral orifice.  Omnipaque contrast was injected through the ureteral catheter and a retrograde pyelogram was performed with findings as dictated above.  A 0.38 sensor guidewire was then advanced up the right ureter into the renal pelvis under fluoroscopic guidance.  The wire was then backloaded through the cystoscope and a ureteral stent was advance over the wire using Seldinger technique.  The stent was positioned appropriately under fluoroscopic and cystoscopic guidance.  The wire was then removed with an adequate stent curl noted in the renal pelvis as well as in the bladder.  The bladder was then emptied and the procedure ended.  The patient appeared to tolerate the procedure well and without complications.  The patient was able to be awakened and transferred to the recovery unit in satisfactory condition.    [name], M.D.

## 2019-05-27 ENCOUNTER — Encounter (HOSPITAL_COMMUNITY): Payer: Self-pay | Admitting: Urology

## 2019-05-27 DIAGNOSIS — G43919 Migraine, unspecified, intractable, without status migrainosus: Secondary | ICD-10-CM | POA: Diagnosis not present

## 2019-05-27 DIAGNOSIS — N132 Hydronephrosis with renal and ureteral calculous obstruction: Secondary | ICD-10-CM | POA: Diagnosis not present

## 2019-05-27 DIAGNOSIS — F988 Other specified behavioral and emotional disorders with onset usually occurring in childhood and adolescence: Secondary | ICD-10-CM | POA: Diagnosis not present

## 2019-05-27 DIAGNOSIS — N390 Urinary tract infection, site not specified: Secondary | ICD-10-CM | POA: Diagnosis not present

## 2019-05-27 DIAGNOSIS — N201 Calculus of ureter: Secondary | ICD-10-CM | POA: Diagnosis not present

## 2019-05-27 DIAGNOSIS — Z79899 Other long term (current) drug therapy: Secondary | ICD-10-CM | POA: Diagnosis not present

## 2019-05-27 DIAGNOSIS — R509 Fever, unspecified: Secondary | ICD-10-CM | POA: Diagnosis not present

## 2019-05-27 DIAGNOSIS — N136 Pyonephrosis: Secondary | ICD-10-CM | POA: Diagnosis not present

## 2019-05-27 DIAGNOSIS — N3080 Other cystitis without hematuria: Secondary | ICD-10-CM | POA: Diagnosis not present

## 2019-05-27 DIAGNOSIS — F332 Major depressive disorder, recurrent severe without psychotic features: Secondary | ICD-10-CM | POA: Diagnosis not present

## 2019-05-27 DIAGNOSIS — B9620 Unspecified Escherichia coli [E. coli] as the cause of diseases classified elsewhere: Secondary | ICD-10-CM | POA: Diagnosis not present

## 2019-05-27 DIAGNOSIS — F419 Anxiety disorder, unspecified: Secondary | ICD-10-CM | POA: Diagnosis not present

## 2019-05-27 LAB — CBC
HCT: 41.1 % (ref 36.0–46.0)
Hemoglobin: 12.9 g/dL (ref 12.0–15.0)
MCH: 30.1 pg (ref 26.0–34.0)
MCHC: 31.4 g/dL (ref 30.0–36.0)
MCV: 96 fL (ref 80.0–100.0)
Platelets: 252 10*3/uL (ref 150–400)
RBC: 4.28 MIL/uL (ref 3.87–5.11)
RDW: 12.8 % (ref 11.5–15.5)
WBC: 8.9 10*3/uL (ref 4.0–10.5)
nRBC: 0 % (ref 0.0–0.2)

## 2019-05-27 LAB — URINE CULTURE: Culture: >=100000 — AB

## 2019-05-27 LAB — BASIC METABOLIC PANEL
Anion gap: 8 (ref 5–15)
BUN: 16 mg/dL (ref 6–20)
CO2: 24 mmol/L (ref 22–32)
Calcium: 8.7 mg/dL — ABNORMAL LOW (ref 8.9–10.3)
Chloride: 107 mmol/L (ref 98–111)
Creatinine, Ser: 0.7 mg/dL (ref 0.44–1.00)
GFR calc Af Amer: >60 mL/min (ref >60)
GFR calc non Af Amer: >60 mL/min (ref >60)
Glucose, Bld: 192 mg/dL — ABNORMAL HIGH (ref 70–99)
Potassium: 4 mmol/L (ref 3.5–5.1)
Sodium: 139 mmol/L (ref 135–145)

## 2019-05-27 MED ORDER — OXYCODONE-ACETAMINOPHEN 10-325 MG PO TABS: 1.0000 | ORAL_TABLET | Freq: Four times a day (QID) | ORAL | 0 refills | Status: DC | PRN

## 2019-05-27 MED ORDER — CIPROFLOXACIN HCL 500 MG PO TABS: 500.0000 mg | ORAL_TABLET | Freq: Two times a day (BID) | ORAL | 0 refills | Status: DC

## 2019-05-27 NOTE — Discharge Summary (Signed)
Date of admission: 05/26/2019  Date of discharge: 05/27/2019  Admission diagnosis: right ureteral stone, UTI  Discharge diagnosis: same  Secondary diagnoses:  Patient Active Problem List   Diagnosis Date Noted  . Right nephrolithiasis 05/26/2019  . Renal calculus 09/13/2018  . Irregular menses 06/05/2018  . Post-nasal drainage 06/05/2018  . Medicare annual wellness visit, subsequent 06/05/2018  . Chronic constipation 03/04/2018  . Chronic cough 06/11/2017  . Kidney stones 05/21/2017  . Chronic pain of left knee 05/21/2017  . Vertigo 07/07/2016  . Diplopia 11/19/2015  . Intractable migraine without aura 10/08/2013  . Dizziness and giddiness 10/08/2013  . Major depressive disorder, recurrent episode, severe, without mention of psychotic behavior 03/06/2012  . Attention deficit disorder without mention of hyperactivity 03/06/2012    Procedures performed: Procedure(s): CYSTOSCOPY WITH RETROGRADE PYELOGRAM/URETERAL STENT PLACEMENT  History and Physical: For full details, please see admission history and physical. Briefly, Rebecca Andrade is a 48 y.o. year old patient with right ureteral stone, urinary tract infection with fever.   Hospital Course: Patient tolerated the procedure well.  She was then transferred to the floor after an uneventful PACU stay.  Her hospital course was uncomplicated.  On POD#1 she had met discharge criteria: was eating a regular diet, was up and ambulating independently,  pain was well controlled, was voiding without a catheter, and was ready to for discharge.  PE: NAD Vitals:   05/26/19 2010 05/26/19 2035 05/27/19 0147 05/27/19 0418  BP: 114/72 110/76 108/77 116/80  Pulse: 82 92 77 65  Resp: _0 Temp: 98.6 F (37 C) 98.3 F (36.8 C) 98.4 F (36.9 C) (!) 97.3 F (36.3 C)  TempSrc:  Oral Oral Oral  SpO2: 99% 96% 92% 96%  Weight:      Height:        Intake/Output Summary (Last 24 hours) at 05/27/2019 0656 Last data filed at 05/27/2019  0600 Gross per 24 hour  Intake 2876.25 ml  Output 1300 ml  Net 1576.25 ml  Non-labored breathing Abdomen soft Extremities symmetric   Laboratory values:  Recent Labs    05/24/19 1431 05/26/19 2119 05/27/19 0534  WBC 10.9* 11.3* 8.9  HGB 15.2* 13.6 12.9  HCT 46.7* 44.8 41.1   Recent Labs    05/24/19 1431 05/26/19 2119 05/27/19 0534  NA 140 141 139  K 4.0 3.7 4.0  CL 106 106 107  CO2 _1 GLUCOSE 101* 131* 192*  BUN 21* 12 16  CREATININE 0.75 0.77 0.70  CALCIUM 9.5 8.6* 8.7*   No results for input(s): LABPT, INR in the last 72 hours. No results for input(s): LABURIN in the last 72 hours. Results for orders placed or performed during the hospital encounter of 05/24/19  Urine Culture     Status: Abnormal (Preliminary result)   Collection Time: 05/24/19  3:40 PM   Specimen: Urine, Random  Result Value Ref Range Status   Specimen Description   Final    URINE, RANDOM Performed at [facility] [address] [place], [place] [address]    Special Requests   Final    NONE Performed at [facility], [place] [address] [place], [place] [address]    Culture >=100,000 COLONIES/mL ESCHERICHIA COLI (A)  Final   Report Status PENDING  Incomplete  SARS Coronavirus 2 (CEPHEID - Performed in [facility] hospital lab), Hosp Order     Status: None   Collection Time: 05/24/19  7:35 PM   Specimen:  Nasopharyngeal Swab  Result Value Ref Range Status   SARS Coronavirus 2 NEGATIVE NEGATIVE Final    Comment: (NOTE) If result is NEGATIVE SARS-CoV-2 target nucleic acids are NOT DETECTED. The SARS-CoV-2 RNA is generally detectable in upper and lower  respiratory specimens during the acute phase of infection. The lowest  concentration of SARS-CoV-2 viral copies this assay can detect is 250  copies / mL. A negative result does not preclude SARS-CoV-2 infection  and should not be used as the sole basis for treatment or other  patient management  decisions.  A negative result may occur with  improper specimen collection / handling, submission of specimen other  than nasopharyngeal swab, presence of viral mutation(s) within the  areas targeted by this assay, and inadequate number of viral copies  (<250 copies / mL). A negative result must be combined with clinical  observations, patient history, and epidemiological information. If result is POSITIVE SARS-CoV-2 target nucleic acids are DETECTED. The SARS-CoV-2 RNA is generally detectable in upper and lower  respiratory specimens dur ing the acute phase of infection.  Positive  results are indicative of active infection with SARS-CoV-2.  Clinical  correlation with patient history and other diagnostic information is  necessary to determine patient infection status.  Positive results do  not rule out bacterial infection or co-infection with other viruses. If result is PRESUMPTIVE POSTIVE SARS-CoV-2 nucleic acids MAY BE PRESENT.   A presumptive positive result was obtained on the submitted specimen  and confirmed on repeat testing.  While 2019 novel coronavirus  (SARS-CoV-2) nucleic acids may be present in the submitted sample  additional confirmatory testing may be necessary for epidemiological  and / or clinical management purposes  to differentiate between  SARS-CoV-2 and other Sarbecovirus currently known to infect humans.  If clinically indicated additional testing with an alternate test  methodology [phone]) is advised. The SARS-CoV-2 RNA is generally  detectable in upper and lower respiratory sp ecimens during the acute  phase of infection. The expected result is Negative. Fact Sheet for Patients:  StrictlyIdeas.no Fact Sheet for Healthcare Providers: BankingDealers.co.za This test is not yet approved or cleared by the [place] FDA and has been authorized for detection and/or diagnosis of SARS-CoV-2 by FDA under an  Emergency Use Authorization (EUA).  This EUA will remain in effect (meaning this test can be used) for the duration of the COVID-19 declaration under Section 564(b)(1) of the Act, 21 U.S.C. section 360bbb-3(b)(1), unless the authorization is terminated or revoked sooner. Performed at [facility], [place] [address] [place], [place] [address]     Disposition: Home  Discharge instruction: The patient was instructed to be ambulatory but told to refrain from heavy lifting, strenuous activity, or driving.   Discharge medications:  Allergies as of 05/27/2019   No Known Allergies     Medication List    STOP taking these medications   ketorolac 10 MG tablet Commonly known as: TORADOL     TAKE these medications   Aimovig 70 MG/ML Soaj Generic drug: Erenumab-aooe Inject 70 mg into the skin every 30 (thirty) days.   amphetamine-dextroamphetamine 10 MG tablet Commonly known as: Adderall Take 2 in am and 2 at noon   busPIRone 30 MG tablet Commonly known as: BUSPAR Take 1 tablet (30 mg total) by mouth 2 (two) times daily.   ciprofloxacin 500 MG tablet Commonly known as: CIPRO Take 1 tablet (500 mg total) by mouth 2 (two) times daily.   FLUoxetine 40 MG capsule Commonly  known as: PROZAC Take 1 capsule (40 mg total) by mouth daily.   Linzess 290 MCG Caps capsule Generic drug: linaclotide TAKE 1 CAPSULE BY MOUTH ONCE DAILY BEFORE BREAKFAST What changed: See the new instructions.   meclizine 25 MG tablet Commonly known as: ANTIVERT TAKE 1 TABLET BY MOUTH THREE TIMES DAILY AS NEEDED FOR  DIZZINESS   ondansetron 4 MG disintegrating tablet Commonly known as: Zofran ODT Take 1 tablet (4 mg total) by mouth every 8 (eight) hours as needed for nausea or vomiting.   oxybutynin 5 MG tablet Commonly known as: DITROPAN Take 5 mg by mouth daily.   oxyCODONE-acetaminophen 10-325 MG tablet Commonly known as: Percocet Take 1 tablet by mouth every 6 (six) hours as  needed for pain.   promethazine 50 MG tablet Commonly known as: PHENERGAN Take 1 tablet (50 mg total) by mouth every 8 (eight) hours as needed for nausea or vomiting.   QUEtiapine 100 MG tablet Commonly known as: SEROQUEL Take 1 tablet (100 mg total) by mouth at bedtime.   tamsulosin 0.4 MG Caps capsule Commonly known as: Flomax Take 1 capsule (0.4 mg total) by mouth daily after supper.   venlafaxine XR 75 MG 24 hr capsule Commonly known as: EFFEXOR-XR Take 1 capsule (75 mg total) by mouth daily with breakfast.       Followup:  Follow-up Information    McKenzie, Candee Furbish, MD In 1 week.   Specialty: Urology Contact information: [address] [place] [address] [phone]

## 2019-05-27 NOTE — Discharge Instructions (Signed)
DISCHARGE INSTRUCTIONS FOR KIDNEY STONE/URETERAL STENT   MEDICATIONS:  1. Resume all your other meds from home - except do not take any extra narcotic pain meds that you may have at home.  2. Percocet is for moderate/severe pain, otherwise taking upto 1000 mg every 6 hours of plainTylenol will help treat your pain.   3. Take Cipro until the prescription is complete (for infection)  ACTIVITY:  1. No strenuous activity x 1week  2. No driving while on narcotic pain medications  3. Drink plenty of water  4. Continue to walk at home - you can still get blood clots when you are at home, so keep active, but don't over do it.  5. May return to work/school tomorrow or when you feel ready   BATHING:  1. You can shower and we recommend daily showers   SIGNS/SYMPTOMS TO CALL:  Please call us if you have a fever greater than 101.5, uncontrolled nausea/vomiting, uncontrolled pain, dizziness, unable to urinate, bloody urine, chest pain, shortness of breath, leg swelling, leg pain, redness around wound, drainage from wound, or any other concerns or questions.   You can reach [place] at [phone].   FOLLOW-UP:  1. You will be scheduled for surgery in the coming weeks.  Someone from McKenzie's office will contact you.

## 2019-05-28 ENCOUNTER — Telehealth: Payer: Self-pay

## 2019-05-28 NOTE — Anesthesia Postprocedure Evaluation (Signed)
Anesthesia Post Note  Patient: Rebecca Andrade  Procedure(s) Performed: CYSTOSCOPY WITH RETROGRADE PYELOGRAM/URETERAL STENT PLACEMENT (Right Ureter)     Patient location during evaluation: PACU Anesthesia Type: General Level of consciousness: awake and alert Pain management: pain level controlled Vital Signs Assessment: post-procedure vital signs reviewed and stable Respiratory status: spontaneous breathing, nonlabored ventilation, respiratory function stable and patient connected to nasal cannula oxygen Cardiovascular status: blood pressure returned to baseline and stable Postop Assessment: no apparent nausea or vomiting Anesthetic complications: no    Last Vitals:  Vitals:   05/27/19 0147 05/27/19 0418  BP: 108/77 116/80  Pulse: 77 65  Resp: 20 18  Temp: 36.9 C (!) 36.3 C  SpO2: 92% 96%    Last Pain:  Vitals:   05/27/19 0730  TempSrc:   PainSc: 0-No pain                 Rebecca Andrade L Markale Birdsell

## 2019-05-28 NOTE — Telephone Encounter (Signed)
Post ED Visit - Positive Culture Follow-up  Culture report reviewed by antimicrobial stewardship pharmacist: [place] Team []  Elenor Quinones, Pharm.D. []  Heide Guile, Pharm.D., BCPS AQ-ID []  [place]ks Neptune, Pharm.D., BCPS []  Alycia Rossetti, Pharm.D., BCPS []  [place], [place].D., BCPS, AAHIVP []  Legrand Como, Pharm.D., BCPS, AAHIVP []  Salome Arnt, PharmD, BCPS []  Johnnette Gourd, PharmD, BCPS []  Hughes Better, PharmD, BCPS []  Leeroy Cha, PharmD []  Laqueta Linden, PharmD, BCPS []  Albertina Parr, PharmD  [place] Team []  Leodis Sias, PharmD []  Lindell Spar, PharmD []  Royetta Asal, PharmD []  Graylin Shiver, Rph []  Rema Fendt) Glennon Mac, PharmD []  Arlyn Dunning, PharmD []  Netta Cedars, PharmD [x]  Dia Sitter, PharmD []  Leone Haven, PharmD []  Gretta Arab, PharmD []  Theodis Shove, PharmD []  Peggyann Juba, PharmD []  Reuel Boom, PharmD   Positive urine culture Treated with Cipro at home prior, organism sensitive to the same and no further patient follow-up is required at this time.  Genia Del 05/28/2019, 9:34 AM

## 2019-05-30 ENCOUNTER — Other Ambulatory Visit: Payer: Self-pay | Admitting: Urology

## 2019-06-02 DIAGNOSIS — N201 Calculus of ureter: Secondary | ICD-10-CM | POA: Diagnosis not present

## 2019-06-03 MED ORDER — BACLOFEN 5 MG PO TABS: 5.0000 mg | ORAL_TABLET | Freq: Two times a day (BID) | ORAL | 3 refills | Status: DC

## 2019-06-03 NOTE — Telephone Encounter (Signed)
Pt called in and stated she doesn't want aimovig, she declined with pharmacy , she wants to know if something else can be given

## 2019-06-03 NOTE — Telephone Encounter (Signed)
I called the patient.  She continues to report daily migraine headache.  I feel that Aimovig would be her best option however she fearful of needles.  She says she does not have anyone to give her a self injection.  Can we administer the injection to the patient in the office, as nurses visit?  And with the patient be charged an office copay? Please call patient and discuss/arrange  For now, we will start baclofen 5 mg twice a day for migraine prevention.  I am limited on medications to offer for migraines, if she does not choose injectable CGRP.

## 2019-06-03 NOTE — Addendum Note (Signed)
Addended by: Suzzanne Cloud on: 06/03/2019 04:04 PM   Modules accepted: Orders

## 2019-06-04 NOTE — Telephone Encounter (Signed)
Sent message to accounting, to see what cost would be for pt if getting injection here in office.

## 2019-06-05 NOTE — Telephone Encounter (Signed)
Lovey Newcomer, This would be a question for Infusion. They should be able to give you an est. Amount. Thanks Angie

## 2019-06-08 ENCOUNTER — Other Ambulatory Visit (HOSPITAL_COMMUNITY): Payer: Self-pay | Admitting: Psychiatry

## 2019-06-08 DIAGNOSIS — F332 Major depressive disorder, recurrent severe without psychotic features: Secondary | ICD-10-CM

## 2019-06-08 DIAGNOSIS — F4010 Social phobia, unspecified: Secondary | ICD-10-CM

## 2019-06-16 ENCOUNTER — Other Ambulatory Visit: Payer: Self-pay

## 2019-06-16 ENCOUNTER — Other Ambulatory Visit (HOSPITAL_COMMUNITY)
Admission: RE | Admit: 2019-06-16 | Discharge: 2019-06-16 | Disposition: A | Discharge status: Home / Self Care | Payer: Medicare HMO | Source: Ambulatory Visit | Attending: Urology | Admitting: Urology

## 2019-06-16 ENCOUNTER — Encounter (HOSPITAL_BASED_OUTPATIENT_CLINIC_OR_DEPARTMENT_OTHER): Payer: Self-pay | Admitting: *Deleted

## 2019-06-16 DIAGNOSIS — Z01812 Encounter for preprocedural laboratory examination: Secondary | ICD-10-CM | POA: Diagnosis not present

## 2019-06-16 DIAGNOSIS — Z20828 Contact with and (suspected) exposure to other viral communicable diseases: Secondary | ICD-10-CM | POA: Diagnosis not present

## 2019-06-16 LAB — SARS CORONAVIRUS 2 (TAT 6-24 HRS): SARS Coronavirus 2: NEGATIVE

## 2019-06-16 NOTE — Progress Notes (Signed)
Spoke with patient via telephone for pre op interview. No solids after MN. Patient can have clear liquids until 0630. Patient verbalized understanding of clear liquid diet and no milk products. Patient to take Buspar, Prozac and Effexor with a sip of water AM of surgery. Will need UPT AM of surgery. Arrival time 1030.

## 2019-06-18 ENCOUNTER — Telehealth (HOSPITAL_COMMUNITY): Payer: Self-pay

## 2019-06-18 DIAGNOSIS — F900 Attention-deficit hyperactivity disorder, predominantly inattentive type: Secondary | ICD-10-CM

## 2019-06-18 MED ORDER — AMPHETAMINE-DEXTROAMPHETAMINE 10 MG PO TABS: ORAL_TABLET | ORAL | 0 refills | Status: DC

## 2019-06-18 NOTE — Telephone Encounter (Signed)
Patient called requesting a refill on her Adderall 10mg . She stated that she will be out as of tomorrow. Patient uses [facility] on [address]. She has a scheduled appointment on 06/23/19. Please review and advise. Thank you.

## 2019-06-19 ENCOUNTER — Encounter (HOSPITAL_BASED_OUTPATIENT_CLINIC_OR_DEPARTMENT_OTHER): Payer: Self-pay

## 2019-06-19 ENCOUNTER — Ambulatory Visit (HOSPITAL_BASED_OUTPATIENT_CLINIC_OR_DEPARTMENT_OTHER): Payer: Medicare HMO | Admitting: Anesthesiology

## 2019-06-19 ENCOUNTER — Ambulatory Visit (HOSPITAL_BASED_OUTPATIENT_CLINIC_OR_DEPARTMENT_OTHER)
Admission: RE | Admit: 2019-06-19 | Discharge: 2019-06-19 | Disposition: A | Discharge status: Home / Self Care | Payer: Medicare HMO | Source: Ambulatory Visit | Attending: Urology | Admitting: Urology

## 2019-06-19 ENCOUNTER — Encounter (HOSPITAL_BASED_OUTPATIENT_CLINIC_OR_DEPARTMENT_OTHER)
Admission: RE | Disposition: A | Discharge status: Home / Self Care | Payer: Self-pay | Source: Ambulatory Visit | Attending: Urology

## 2019-06-19 DIAGNOSIS — F1490 Cocaine use, unspecified, uncomplicated: Secondary | ICD-10-CM | POA: Insufficient documentation

## 2019-06-19 DIAGNOSIS — G8929 Other chronic pain: Secondary | ICD-10-CM | POA: Diagnosis not present

## 2019-06-19 DIAGNOSIS — F329 Major depressive disorder, single episode, unspecified: Secondary | ICD-10-CM | POA: Insufficient documentation

## 2019-06-19 DIAGNOSIS — F909 Attention-deficit hyperactivity disorder, unspecified type: Secondary | ICD-10-CM | POA: Diagnosis not present

## 2019-06-19 DIAGNOSIS — I700 Atherosclerosis of aorta: Secondary | ICD-10-CM | POA: Diagnosis not present

## 2019-06-19 DIAGNOSIS — F418 Other specified anxiety disorders: Secondary | ICD-10-CM | POA: Diagnosis not present

## 2019-06-19 DIAGNOSIS — M25562 Pain in left knee: Secondary | ICD-10-CM | POA: Diagnosis not present

## 2019-06-19 DIAGNOSIS — F419 Anxiety disorder, unspecified: Secondary | ICD-10-CM | POA: Insufficient documentation

## 2019-06-19 DIAGNOSIS — Z87442 Personal history of urinary calculi: Secondary | ICD-10-CM | POA: Insufficient documentation

## 2019-06-19 DIAGNOSIS — G43909 Migraine, unspecified, not intractable, without status migrainosus: Secondary | ICD-10-CM | POA: Diagnosis not present

## 2019-06-19 DIAGNOSIS — Z79899 Other long term (current) drug therapy: Secondary | ICD-10-CM | POA: Diagnosis not present

## 2019-06-19 DIAGNOSIS — N189 Chronic kidney disease, unspecified: Secondary | ICD-10-CM | POA: Diagnosis not present

## 2019-06-19 DIAGNOSIS — I708 Atherosclerosis of other arteries: Secondary | ICD-10-CM | POA: Insufficient documentation

## 2019-06-19 DIAGNOSIS — N132 Hydronephrosis with renal and ureteral calculous obstruction: Secondary | ICD-10-CM | POA: Insufficient documentation

## 2019-06-19 DIAGNOSIS — Z791 Long term (current) use of non-steroidal anti-inflammatories (NSAID): Secondary | ICD-10-CM | POA: Diagnosis not present

## 2019-06-19 DIAGNOSIS — N201 Calculus of ureter: Secondary | ICD-10-CM | POA: Diagnosis not present

## 2019-06-19 HISTORY — PX: HOLMIUM LASER APPLICATION: SHX5852

## 2019-06-19 HISTORY — PX: CYSTOSCOPY WITH RETROGRADE PYELOGRAM, URETEROSCOPY AND STENT PLACEMENT: SHX5789

## 2019-06-19 LAB — POCT PREGNANCY, URINE: Preg Test, Ur: NEGATIVE

## 2019-06-19 SURGERY — CYSTOURETEROSCOPY, WITH RETROGRADE PYELOGRAM AND STENT INSERTION
Anesthesia: General | Site: Ureter | Laterality: Right

## 2019-06-19 MED ORDER — PROPOFOL 10 MG/ML IV BOLUS
INTRAVENOUS | Status: AC
  Filled 2019-06-19: qty 20

## 2019-06-19 MED ORDER — LIDOCAINE 2% (20 MG/ML) 5 ML SYRINGE
INTRAMUSCULAR | Status: DC | PRN
  Administered 2019-06-19: 100 mg via INTRAVENOUS

## 2019-06-19 MED ORDER — IOHEXOL 300 MG/ML  SOLN
INTRAMUSCULAR | Status: DC | PRN
  Administered 2019-06-19: 5 mL via URETHRAL

## 2019-06-19 MED ORDER — FENTANYL CITRATE (PF) 100 MCG/2ML IJ SOLN
INTRAMUSCULAR | Status: AC
  Filled 2019-06-19: qty 2

## 2019-06-19 MED ORDER — ARTIFICIAL TEARS OPHTHALMIC OINT
TOPICAL_OINTMENT | OPHTHALMIC | Status: AC
  Filled 2019-06-19: qty 3.5

## 2019-06-19 MED ORDER — LIDOCAINE 2% (20 MG/ML) 5 ML SYRINGE
INTRAMUSCULAR | Status: AC
  Filled 2019-06-19: qty 5

## 2019-06-19 MED ORDER — MEPERIDINE HCL 25 MG/ML IJ SOLN
6.2500 mg | INTRAMUSCULAR | Status: DC | PRN
  Filled 2019-06-19: qty 1

## 2019-06-19 MED ORDER — SODIUM CHLORIDE 0.9 % IV SOLN
INTRAVENOUS | Status: DC
  Administered 2019-06-19 (×2): via INTRAVENOUS
  Filled 2019-06-19: qty 1000

## 2019-06-19 MED ORDER — HYDROMORPHONE HCL 1 MG/ML IJ SOLN
0.2500 mg | INTRAMUSCULAR | Status: DC | PRN
  Filled 2019-06-19: qty 0.5

## 2019-06-19 MED ORDER — CEFTRIAXONE SODIUM 2 G IJ SOLR
INTRAMUSCULAR | Status: AC
  Filled 2019-06-19: qty 20

## 2019-06-19 MED ORDER — SODIUM CHLORIDE 0.9 % IV SOLN
INTRAVENOUS | Status: AC
  Filled 2019-06-19: qty 100

## 2019-06-19 MED ORDER — ONDANSETRON HCL 4 MG/2ML IJ SOLN
INTRAMUSCULAR | Status: AC
  Filled 2019-06-19: qty 2

## 2019-06-19 MED ORDER — MIDAZOLAM HCL 2 MG/2ML IJ SOLN
INTRAMUSCULAR | Status: DC | PRN
  Administered 2019-06-19: 2 mg via INTRAVENOUS

## 2019-06-19 MED ORDER — PROPOFOL 10 MG/ML IV BOLUS
INTRAVENOUS | Status: DC | PRN
  Administered 2019-06-19: 200 mg via INTRAVENOUS

## 2019-06-19 MED ORDER — KETOROLAC TROMETHAMINE 30 MG/ML IJ SOLN
30.0000 mg | Freq: Once | INTRAMUSCULAR | Status: DC | PRN
  Filled 2019-06-19: qty 1

## 2019-06-19 MED ORDER — MIDAZOLAM HCL 2 MG/2ML IJ SOLN
INTRAMUSCULAR | Status: AC
  Filled 2019-06-19: qty 2

## 2019-06-19 MED ORDER — FENTANYL CITRATE (PF) 100 MCG/2ML IJ SOLN
INTRAMUSCULAR | Status: DC | PRN
  Administered 2019-06-19: 50 ug via INTRAVENOUS
  Administered 2019-06-19: 100 ug via INTRAVENOUS

## 2019-06-19 MED ORDER — DEXAMETHASONE SODIUM PHOSPHATE 10 MG/ML IJ SOLN
INTRAMUSCULAR | Status: AC
  Filled 2019-06-19: qty 1

## 2019-06-19 MED ORDER — FENTANYL CITRATE (PF) 100 MCG/2ML IJ SOLN
100.0000 ug | Freq: Once | INTRAMUSCULAR | Status: DC
  Filled 2019-06-19: qty 2

## 2019-06-19 MED ORDER — FENTANYL CITRATE (PF) 100 MCG/2ML IJ SOLN
100.0000 ug | Freq: Once | INTRAMUSCULAR | Status: AC
  Administered 2019-06-19 (×2): 50 ug via INTRAVENOUS
  Filled 2019-06-19: qty 2

## 2019-06-19 MED ORDER — DEXAMETHASONE SODIUM PHOSPHATE 10 MG/ML IJ SOLN
INTRAMUSCULAR | Status: DC | PRN
  Administered 2019-06-19: 10 mg via INTRAVENOUS

## 2019-06-19 MED ORDER — OXYCODONE-ACETAMINOPHEN 10-325 MG PO TABS: 1.0000 | ORAL_TABLET | ORAL | 0 refills | Status: DC | PRN

## 2019-06-19 MED ORDER — PROMETHAZINE HCL 25 MG/ML IJ SOLN
6.2500 mg | INTRAMUSCULAR | Status: DC | PRN
  Filled 2019-06-19: qty 1

## 2019-06-19 MED ORDER — ONDANSETRON HCL 4 MG/2ML IJ SOLN
INTRAMUSCULAR | Status: DC | PRN
  Administered 2019-06-19: 4 mg via INTRAVENOUS

## 2019-06-19 MED ORDER — SODIUM CHLORIDE 0.9 % IV SOLN
2.0000 g | INTRAVENOUS | Status: AC
  Administered 2019-06-19: 2 g via INTRAVENOUS
  Filled 2019-06-19: qty 20

## 2019-06-19 SURGICAL SUPPLY — 28 items
BAG DRAIN URO-CYSTO SKYTR STRL (DRAIN) ×3 IMPLANT
BAG DRN UROCATH (DRAIN) ×2
BASKET STONE 1.7 NGAGE (UROLOGICAL SUPPLIES) IMPLANT
CATH INTERMIT  6FR 70CM (CATHETERS) ×1 IMPLANT
CLOTH BEACON ORANGE TIMEOUT ST (SAFETY) ×3 IMPLANT
EVACUATOR MICROVAS BLADDER (UROLOGICAL SUPPLIES) IMPLANT
EXTRACTOR STONE 1.7FRX115CM (UROLOGICAL SUPPLIES) ×1 IMPLANT
FIBER LASER FLEXIVA 1000 (UROLOGICAL SUPPLIES) IMPLANT
FIBER LASER FLEXIVA 200 (UROLOGICAL SUPPLIES) IMPLANT
FIBER LASER FLEXIVA 365 (UROLOGICAL SUPPLIES) IMPLANT
FIBER LASER FLEXIVA 550 (UROLOGICAL SUPPLIES) IMPLANT
FIBER LASER TRAC TIP (UROLOGICAL SUPPLIES) IMPLANT
GLOVE BIO SURGEON STRL SZ8 (GLOVE) ×3 IMPLANT
GOWN STRL REUS W/TWL XL LVL3 (GOWN DISPOSABLE) ×3 IMPLANT
GUIDEWIRE STR DUAL SENSOR (WIRE) ×1 IMPLANT
GUIDEWIRE ZIPWRE .038 STRAIGHT (WIRE) ×3 IMPLANT
IV NS 1000ML (IV SOLUTION) ×3
IV NS 1000ML BAXH (IV SOLUTION) ×2 IMPLANT
IV NS IRRIG 3000ML ARTHROMATIC (IV SOLUTION) ×3 IMPLANT
KIT TURNOVER CYSTO (KITS) ×3 IMPLANT
MANIFOLD NEPTUNE II (INSTRUMENTS) ×3 IMPLANT
NS IRRIG 500ML POUR BTL (IV SOLUTION) ×3 IMPLANT
PACK CYSTO (CUSTOM PROCEDURE TRAY) ×3 IMPLANT
STENT URET 6FRX24 CONTOUR (STENTS) ×1 IMPLANT
STENT URET 6FRX26 CONTOUR (STENTS) IMPLANT
SYR 10ML LL (SYRINGE) ×3 IMPLANT
TUBE CONNECTING 12X1/4 (SUCTIONS) ×1 IMPLANT
TUBING UROLOGY SET (TUBING) ×3 IMPLANT

## 2019-06-19 NOTE — Discharge Instructions (Signed)
Ureteral Stent Implantation, Care After °This sheet gives you information about how to care for yourself after your procedure. Your health care provider may also give you more specific instructions. If you have problems or questions, contact your health care provider. °What can I expect after the procedure? °After the procedure, it is common to have: °· Nausea. °· Mild pain when you urinate. You may feel this pain in your lower back or lower abdomen. The pain should stop within a few minutes after you urinate. This may last for up to 1 week. °· A small amount of blood in your urine for several days. °Follow these instructions at home: °Medicines °· Take over-the-counter and prescription medicines only as told by your health care provider. °· If you were prescribed an antibiotic medicine, take it as told by your health care provider. Do not stop taking the antibiotic even if you start to feel better. °· Do not drive for 24 hours if you were given a sedative during your procedure. °· Ask your health care provider if the medicine prescribed to you requires you to avoid driving or using heavy machinery. °Activity °· Rest as told by your health care provider. °· Avoid sitting for a long time without moving. Get up to take short walks every 1-2 hours. This is important to improve blood flow and breathing. Ask for help if you feel weak or unsteady. °· Return to your normal activities as told by your health care provider. Ask your health care provider what activities are safe for you. °General instructions ° °· Watch for any blood in your urine. Call your health care provider if the amount of blood in your urine increases. °· If you have a catheter: °? Follow instructions from your health care provider about taking care of your catheter and collection bag. °? Do not take baths, swim, or use a hot tub until your health care provider approves. Ask your health care provider if you may take showers. You may only be allowed to  take sponge baths. °· Drink enough fluid to keep your urine pale yellow. °· Do not use any products that contain nicotine or tobacco, such as cigarettes, e-cigarettes, and chewing tobacco. These can delay healing after surgery. If you need help quitting, ask your health care provider. °· Keep all follow-up visits as told by your health care provider. This is important. °Contact a health care provider if: °· You have pain that gets worse or does not get better with medicine, especially pain when you urinate. °· You have difficulty urinating. °· You feel nauseous or you vomit repeatedly during a period of more than 2 days after the procedure. °Get help right away if: °· Your urine is dark red or has blood clots in it. °· You are leaking urine (have incontinence). °· The end of the stent comes out of your urethra. °· You cannot urinate. °· You have sudden, sharp, or severe pain in your abdomen or lower back. °· You have a fever. °· You have swelling or pain in your legs. °· You have difficulty breathing. °Summary °· After the procedure, it is common to have mild pain when you urinate that goes away within a few minutes after you urinate. This may last for up to 1 week. °· Watch for any blood in your urine. Call your health care provider if the amount of blood in your urine increases. °· Take over-the-counter and prescription medicines only as told by your health care provider. °· Drink   enough fluid to keep your urine pale yellow. °This information is not intended to replace advice given to you by your health care provider. Make sure you discuss any questions you have with your health care provider. °Document Released: 06/25/2013 Document Revised: 07/30/2018 Document Reviewed: 07/31/2018 °Elsevier Patient Education © 2020 [employer] ° °PLEASE REMOVE YOUR STENT IN 72 HOURS BY GENTLY PULLING THE STRING ° °Post Anesthesia Home Care Instructions ° °Activity: °Get plenty of rest for the remainder of the day. A responsible  individual must stay with you for 24 hours following the procedure.  °For the next 24 hours, DO NOT: °-Drive a car °-Operate machinery °-Drink alcoholic beverages °-Take any medication unless instructed by your physician °-Make any legal decisions or sign important papers. ° °Meals: °Start with liquid foods such as gelatin or soup. Progress to regular foods as tolerated. Avoid greasy, spicy, heavy foods. If nausea and/or vomiting occur, drink only clear liquids until the nausea and/or vomiting subsides. Call your physician if vomiting continues. ° °Special Instructions/Symptoms: °Your throat may feel dry or sore from the anesthesia or the breathing tube placed in your throat during surgery. If this causes discomfort, gargle with warm salt water. The discomfort should disappear within 24 hours. ° °If you had a scopolamine patch placed behind your ear for the management of post- operative nausea and/or vomiting: ° °1. The medication in the patch is effective for 72 hours, after which it should be removed.  Wrap patch in a tissue and discard in the trash. Wash hands thoroughly with soap and water. °2. You may remove the patch earlier than 72 hours if you experience unpleasant side effects which may include dry mouth, dizziness or visual disturbances. °3. Avoid touching the patch. Wash your hands with soap and water after contact with the patch. °   ° °

## 2019-06-19 NOTE — Transfer of Care (Signed)
Immediate Anesthesia Transfer of Care Note  Patient: Rebecca Andrade  Procedure(s) Performed: CYSTOSCOPY WITH RETROGRADE PYELOGRAM, URETEROSCOPY AND STENT PLACEMENT (Right Bladder) HOLMIUM LASER APPLICATION (Right Ureter)  Patient Location: PACU  Anesthesia Type:General  Level of Consciousness: drowsy  Airway & Oxygen Therapy: Patient Spontanous Breathing and Patient connected to nasal cannula oxygen  Post-op Assessment: Report given to RN and Post -op Vital signs reviewed and stable  Post vital signs: Reviewed and stable  Last Vitals:  Vitals Value Taken Time  BP    Temp    Pulse 73 06/19/19 1329  Resp 15 06/19/19 1329  SpO2 98 % 06/19/19 1329  Vitals shown include unvalidated device data.  Last Pain:  Vitals:   06/19/19 1240  TempSrc:   PainSc: 7       Patients Stated Pain Goal: 7 (60/04/59 9774)  Complications: No apparent anesthesia complications

## 2019-06-19 NOTE — Op Note (Signed)
Preoperative diagnosis: Right ureteral stone  Postoperative diagnosis: Same  Procedure: 1 cystoscopy 2 right retrograde pyelography 3.  Intraoperative fluoroscopy, under one hour, with interpretation 4.  Right ureteroscopic stone manipulation with laser lithotripsy 5.  Right 6 x 24 JJ stent exchange  Attending: Rosie Fate  Anesthesia: General  Estimated blood loss: None  Drains: Right 6 x 26 JJ ureteral stent with tether  Specimens: stone for analysis  Antibiotics: ancef  Findings: right proximal ureteral calculus.  Indications: Patient is a 48 year old female/female with a history of ureteral stone and who developed fever and underwent stent placement.  After discussing treatment options, she decided proceed with right ureteroscopic stone manipulation.  Procedure her in detail: The patient was brought to the operating room and a brief timeout was done to ensure correct patient, correct procedure, correct site.  General anesthesia was administered patient was placed in dorsal lithotomy position.  Her genitalia was then prepped and draped in usual sterile fashion.  A rigid 20 French cystoscope was passed in the urethra and the bladder.  Bladder was inspected free masses or lesions.  the right ureteral orifices were in the normal orthotopic locations. Using a grasper the right ureteral stent was brought to the urethral meatus. A zipwire was advanced through the stent and up to the renal pelvis. The stent was then removed.  a 6 french ureteral catheter was then instilled into the right ureter orifice.  a gentle retrograde was obtained and findings noted above.  We then cannulated the right ureteral orifice with a semirigid ureteroscope.  we then encountered the stone in the proximal ureter.  using a 200 nm laser fiber and fragmented the stone into smaller pieces.  the pieces were then removed with a Ngage basket.    once all stone fragments were removed we then placed a 6 x 26 double-j  ureteral stent over the original zip wire. We then removed the wire and good coil was noted in the the renal pelvis under fluoroscopy and the bladder under direct vision.  the stone fragments were then removed from the bladder and sent for analysis.   the bladder was then drained and this concluded the procedure which was well tolerated by patient.  Complications: None  Condition: Stable, extubated, transferred to PACU  Plan: Patient is to be discharged home as to follow-up in one week. She is to remove her stent in 72 hours by pulling the tether

## 2019-06-19 NOTE — Anesthesia Procedure Notes (Signed)
Procedure Name: LMA Insertion Date/Time: 06/19/2019 12:56 PM Performed by: Wanita Chamberlain, CRNA Pre-anesthesia Checklist: Patient identified, Emergency Drugs available, Suction available and Patient being monitored Patient Re-evaluated:Patient Re-evaluated prior to induction Oxygen Delivery Method: Circle system utilized Preoxygenation: Pre-oxygenation with 100% oxygen Induction Type: IV induction Ventilation: Mask ventilation without difficulty LMA: LMA inserted LMA Size: 3.0 Number of attempts: 1 Placement Confirmation: positive ETCO2 and breath sounds checked- equal and bilateral Tube secured with: Tape Dental Injury: Teeth and Oropharynx as per pre-operative assessment

## 2019-06-19 NOTE — Anesthesia Postprocedure Evaluation (Signed)
Anesthesia Post Note  Patient: Rebecca Andrade  Procedure(s) Performed: CYSTOSCOPY WITH RETROGRADE PYELOGRAM, URETEROSCOPY AND STENT PLACEMENT (Right Bladder) HOLMIUM LASER APPLICATION (Right Ureter)     Patient location during evaluation: PACU Anesthesia Type: General Level of consciousness: awake Pain management: pain level controlled Vital Signs Assessment: post-procedure vital signs reviewed and stable Respiratory status: spontaneous breathing Cardiovascular status: stable Postop Assessment: no apparent nausea or vomiting Anesthetic complications: no    Last Vitals:  Vitals:   06/19/19 1330 06/19/19 1340  BP: 111/73 119/84  Pulse: 73 77  Resp: 15 20  Temp:    SpO2: 98% 92%    Last Pain:  Vitals:   06/19/19 1326  TempSrc:   PainSc: Asleep   Pain Goal: Patients Stated Pain Goal: 7 (06/19/19 1008)                 Huston Foley

## 2019-06-19 NOTE — Anesthesia Preprocedure Evaluation (Addendum)
Anesthesia Evaluation  Patient identified by MRN, date of birth, ID band Patient awake    Reviewed: Allergy & Precautions, NPO status , Patient's Chart, lab work & pertinent test results  History of Anesthesia Complications (+) PONV  Airway Mallampati: II  TM Distance: >3 FB Neck ROM: Full  Mouth opening: Limited Mouth Opening  Dental no notable dental hx. (+) Partial Upper, Dental Advisory Given,    Pulmonary neg pulmonary ROS,    Pulmonary exam normal breath sounds clear to auscultation       Cardiovascular negative cardio ROS Normal cardiovascular exam Rhythm:Regular Rate:Normal     Neuro/Psych  Headaches, PSYCHIATRIC DISORDERS Anxiety Depression    GI/Hepatic negative GI ROS, (+)     substance abuse  cocaine use,   Endo/Other  negative endocrine ROS  Renal/GU Renal diseaseKidney stone  negative genitourinary   Musculoskeletal negative musculoskeletal ROS (+)   Abdominal Normal abdominal exam  (+)   Peds  (+) ADHD Hematology negative hematology ROS (+)   Anesthesia Other Findings   Reproductive/Obstetrics negative OB ROS                            Anesthesia Physical  Anesthesia Plan  ASA: II  Anesthesia Plan: General   Post-op Pain Management:    Induction: Intravenous  PONV Risk Score and Plan: 4 or greater and Ondansetron, Dexamethasone, Midazolam and Treatment may vary due to age or medical condition  Airway Management Planned: LMA  Additional Equipment:   Intra-op Plan:   Post-operative Plan: Extubation in OR  Informed Consent: I have reviewed the patients History and Physical, chart, labs and discussed the procedure including the risks, benefits and alternatives for the proposed anesthesia with the patient or authorized representative who has indicated his/her understanding and acceptance.     Dental advisory given  Plan Discussed with: CRNA  Anesthesia  Plan Comments:         Anesthesia Quick Evaluation

## 2019-06-19 NOTE — Interval H&P Note (Signed)
History and Physical Interval Note:  06/19/2019 12:39 PM  Rebecca Andrade  has presented today for surgery, with the diagnosis of RIGHT URETERAL CALCULUS.  The various methods of treatment have been discussed with the patient and family. After consideration of risks, benefits and other options for treatment, the patient has consented to  Procedure(s) with comments: CYSTOSCOPY WITH RETROGRADE PYELOGRAM, URETEROSCOPY AND STENT PLACEMENT (Right) - 30 MINS HOLMIUM LASER APPLICATION (Right) as a surgical intervention.  The patient's history has been reviewed, patient examined, no change in status, stable for surgery.  I have reviewed the patient's chart and labs.  Questions were answered to the patient's satisfaction.     Nicolette Bang

## 2019-06-20 ENCOUNTER — Encounter (HOSPITAL_BASED_OUTPATIENT_CLINIC_OR_DEPARTMENT_OTHER): Payer: Self-pay | Admitting: Urology

## 2019-06-20 ENCOUNTER — Ambulatory Visit (HOSPITAL_COMMUNITY): Payer: Medicare HMO | Admitting: Psychiatry

## 2019-06-23 ENCOUNTER — Other Ambulatory Visit: Payer: Self-pay

## 2019-06-23 ENCOUNTER — Encounter

## 2019-06-23 ENCOUNTER — Encounter (HOSPITAL_COMMUNITY): Payer: Self-pay | Admitting: Psychiatry

## 2019-06-23 ENCOUNTER — Ambulatory Visit (INDEPENDENT_AMBULATORY_CARE_PROVIDER_SITE_OTHER): Payer: Medicare HMO | Admitting: Psychiatry

## 2019-06-23 DIAGNOSIS — F900 Attention-deficit hyperactivity disorder, predominantly inattentive type: Secondary | ICD-10-CM

## 2019-06-23 DIAGNOSIS — F4010 Social phobia, unspecified: Secondary | ICD-10-CM | POA: Diagnosis not present

## 2019-06-23 DIAGNOSIS — F332 Major depressive disorder, recurrent severe without psychotic features: Secondary | ICD-10-CM | POA: Diagnosis not present

## 2019-06-23 MED ORDER — AMPHETAMINE-DEXTROAMPHETAMINE 10 MG PO TABS: ORAL_TABLET | ORAL | 0 refills | Status: DC

## 2019-06-23 MED ORDER — FLUOXETINE HCL 40 MG PO CAPS: 40.0000 mg | ORAL_CAPSULE | Freq: Every day | ORAL | 1 refills | Status: DC

## 2019-06-23 MED ORDER — BUSPIRONE HCL 30 MG PO TABS: 30.0000 mg | ORAL_TABLET | Freq: Two times a day (BID) | ORAL | 1 refills | Status: DC

## 2019-06-23 NOTE — Progress Notes (Signed)
Virtual Visit via Telephone Note  I connected with Rebecca Andrade on 06/23/19 at 10:00 AM EDT by telephone and verified that I am speaking with the correct person using two identifiers.   I discussed the limitations, risks, security and privacy concerns of performing an evaluation and management service by telephone and the availability of in person appointments. I also discussed with the patient that there may be a patient responsible charge related to this service. The patient expressed understanding and agreed to proceed.   History of Present Illness: Patient was evaluated by phone session.  She is recently had a procedure for her kidney stone.  She is recovering from it.  She was very nervous and anxious because she was told that kidney stone is causing infection in her kidney.  She had lost the weight because she was not eating and having nausea.  Now she is recovering slowly and gradually.  She was prescribed Percocet but she had not taking every day.  She is taking Adderall to help her focus and concentration.  She is also very sad because her 14 year old dog is not doing well and she may need to put her sleep.  She is very sad about it.  She admitted sometimes crying spells.  She is taking her medication as prescribed.  She is taking Effexor, Prozac, BuSpar, Adderall and Seroquel.  She is still have a lot of anxiety going to the public places.  She is not interested in therapy.  She denies any suicidal thoughts or homicidal thought.  Her energy level is fair.  Her appetite is now picking up again.  She denies drinking or using any illegal substances.  She lives with her son who is very supportive.   Past Psychiatric History:Reviewed. H/Otaking antidepressants since 1990. H/Oinpatient twice,onein 2005 andother [id number].H/OHistory of cocaine use,paranoia, hallucination, psychosis, mood swing, anger and mania.No h/osuicidal attempt.TriedZoloft, Paxil, Lexapro, Wellbutrin, lithium,  Remeron, Topamax, Abilify, Pristiq, Effexor, amitriptyline, Xanax, temazepam, Valium and Cymbalta. PCPdescribed Adderall for ADD.  Recent Results (from the past 2160 hour(s))  CBC     Status: Abnormal   Collection Time: 05/24/19  2:31 PM  Result Value Ref Range   WBC 10.9 (H) 4.0 - 10.5 K/uL   RBC 5.01 3.87 - 5.11 MIL/uL   Hemoglobin 15.2 (H) 12.0 - 15.0 g/dL   HCT 46.7 (H) 36.0 - 46.0 %   MCV 93.2 80.0 - 100.0 fL   MCH 30.3 26.0 - 34.0 pg   MCHC 32.5 30.0 - 36.0 g/dL   RDW 12.8 11.5 - 15.5 %   Platelets 288 150 - 400 K/uL   nRBC 0.0 0.0 - 0.2 %    Comment: Performed at [facility], [place] [address], [place], [place] [address]  Basic metabolic panel     Status: Abnormal   Collection Time: 05/24/19  2:31 PM  Result Value Ref Range   Sodium 140 135 - 145 mmol/L   Potassium 4.0 3.5 - 5.1 mmol/L   Chloride 106 98 - 111 mmol/L   CO2 22 22 - 32 mmol/L   Glucose, Bld 101 (H) 70 - 99 mg/dL   BUN 21 (H) 6 - 20 mg/dL   Creatinine, Ser 0.75 0.44 - 1.00 mg/dL   Calcium 9.5 8.9 - 10.3 mg/dL   GFR calc non Af Amer >60 >60 mL/min   GFR calc Af Amer >60 >60 mL/min   Anion gap 12 5 - 15    Comment: Performed at [facility], [address] [place], [place] [address]  Lipase, blood     Status: Abnormal   Collection Time: 05/24/19  2:31 PM  Result Value Ref Range   Lipase 52 (H) 11 - 51 U/L    Comment: Performed at [facility], [address] [place], [place] [address]  I-Stat beta hCG blood, ED     Status: None   Collection Time: 05/24/19  2:33 PM  Result Value Ref Range   I-stat hCG, quantitative <5.0 <5 mIU/mL   Comment 3            Comment:   GEST. AGE      CONC.  (mIU/mL)   <=1 WEEK        5 - 50     2 WEEKS       50 - 500     3 WEEKS       100 - 10,000     4 WEEKS     1,000 - 30,000        FEMALE AND NON-PREGNANT FEMALE:     LESS THAN 5 mIU/mL   Urinalysis, Routine w reflex microscopic- may I&O cath if menses      Status: Abnormal   Collection Time: 05/24/19  3:40 PM  Result Value Ref Range   Color, Urine YELLOW YELLOW   APPearance HAZY (A) CLEAR   Specific Gravity, Urine 1.014 1.005 - 1.030   pH 5.0 5.0 - 8.0   Glucose, UA NEGATIVE NEGATIVE mg/dL   Hgb urine dipstick NEGATIVE NEGATIVE   Bilirubin Urine NEGATIVE NEGATIVE   Ketones, ur NEGATIVE NEGATIVE mg/dL   Protein, ur NEGATIVE NEGATIVE mg/dL   Nitrite NEGATIVE NEGATIVE   Leukocytes,Ua TRACE (A) NEGATIVE   RBC / HPF 0-5 0 - 5 RBC/hpf   WBC, UA 11-20 0 - 5 WBC/hpf   Bacteria, UA FEW (A) NONE SEEN   Squamous Epithelial / LPF 0-5 0 - 5    Comment: Performed at [facility], [address], [place], [place] [address]  Urine Culture     Status: Abnormal   Collection Time: 05/24/19  3:40 PM   Specimen: Urine, Random  Result Value Ref Range   Specimen Description      URINE, RANDOM Performed at [facility], [address], [place], [place] [address]    Special Requests      NONE Performed at [facility], [address], [place], [place] [address]    Culture >=100,000 COLONIES/mL ESCHERICHIA COLI (A)    Report Status 05/27/2019 FINAL    Organism ID, Bacteria ESCHERICHIA COLI (A)       Susceptibility   Escherichia coli - MIC*    AMPICILLIN <=2 SENSITIVE Sensitive     CEFAZOLIN <=4 SENSITIVE Sensitive     CEFTRIAXONE <=1 SENSITIVE Sensitive     CIPROFLOXACIN <=0.25 SENSITIVE Sensitive     GENTAMICIN <=1 SENSITIVE Sensitive     IMIPENEM <=0.25 SENSITIVE Sensitive     NITROFURANTOIN <=16 SENSITIVE Sensitive     TRIMETH/SULFA <=20 SENSITIVE Sensitive     AMPICILLIN/SULBACTAM <=2 SENSITIVE Sensitive     PIP/TAZO <=4 SENSITIVE Sensitive     Extended ESBL NEGATIVE Sensitive     * >=100,000 COLONIES/mL ESCHERICHIA COLI  SARS Coronavirus 2 (CEPHEID - Performed in [facility] hospital lab), Hosp Order     Status: None   Collection Time: 05/24/19  7:35 PM   Specimen: Nasopharyngeal  Swab  Result Value Ref Range  SARS Coronavirus 2 NEGATIVE NEGATIVE    Comment: (NOTE) If result is NEGATIVE SARS-CoV-2 target nucleic acids are NOT DETECTED. The SARS-CoV-2 RNA is generally detectable in upper and lower  respiratory specimens during the acute phase of infection. The lowest  concentration of SARS-CoV-2 viral copies this assay can detect is 250  copies / mL. A negative result does not preclude SARS-CoV-2 infection  and should not be used as the sole basis for treatment or other  patient management decisions.  A negative result may occur with  improper specimen collection / handling, submission of specimen other  than nasopharyngeal swab, presence of viral mutation(s) within the  areas targeted by this assay, and inadequate number of viral copies  (<250 copies / mL). A negative result must be combined with clinical  observations, patient history, and epidemiological information. If result is POSITIVE SARS-CoV-2 target nucleic acids are DETECTED. The SARS-CoV-2 RNA is generally detectable in upper and lower  respiratory specimens dur ing the acute phase of infection.  Positive  results are indicative of active infection with SARS-CoV-2.  Clinical  correlation with patient history and other diagnostic information is  necessary to determine patient infection status.  Positive results do  not rule out bacterial infection or co-infection with other viruses. If result is PRESUMPTIVE POSTIVE SARS-CoV-2 nucleic acids MAY BE PRESENT.   A presumptive positive result was obtained on the submitted specimen  and confirmed on repeat testing.  While 2019 novel coronavirus  (SARS-CoV-2) nucleic acids may be present in the submitted sample  additional confirmatory testing may be necessary for epidemiological  and / or clinical management purposes  to differentiate between  SARS-CoV-2 and other Sarbecovirus currently known to infect humans.  If clinically indicated additional testing  with an alternate test  methodology [phone]([id number]) is advised. The SARS-CoV-2 RNA is generally  detectable in upper and lower respiratory sp ecimens during the acute  phase of infection. The expected result is Negative. Fact Sheet for Patients:  BoilerBrush.com.cyhttps://www.fda.gov/media/136312/download Fact Sheet for Healthcare Providers: https://pope.com/https://www.fda.gov/media/136313/download This test is not yet approved or cleared by the [place] FDA and has been authorized for detection and/or diagnosis of SARS-CoV-2 by FDA under an Emergency Use Authorization (EUA).  This EUA will remain in effect (meaning this test can be used) for the duration of the COVID-19 declaration under Section 564(b)(1) of the Act, 21 U.S.C. section 360bbb-3(b)(1), unless the authorization is terminated or revoked sooner. Performed at [facility], [address] [place], [place] [address]   Basic metabolic panel     Status: Abnormal   Collection Time: 05/26/19  9:19 PM  Result Value Ref Range   Sodium 141 135 - 145 mmol/L   Potassium 3.7 3.5 - 5.1 mmol/L   Chloride 106 98 - 111 mmol/L   CO2 24 22 - 32 mmol/L   Glucose, Bld 131 (H) 70 - 99 mg/dL   BUN 12 6 - 20 mg/dL   Creatinine, Ser 6.290.77 0.44 - 1.00 mg/dL   Calcium 8.6 (L) 8.9 - 10.3 mg/dL   GFR calc non Af Amer >60 >60 mL/min   GFR calc Af Amer >60 >60 mL/min   Anion gap 11 5 - 15    Comment: Performed at [facility], [address] [place], [place] [address]  CBC     Status: Abnormal   Collection Time: 05/26/19  9:19 PM  Result Value Ref Range   WBC 11.3 (H) 4.0 - 10.5 K/uL   RBC 4.62 3.87 -  5.11 MIL/uL   Hemoglobin 13.6 12.0 - 15.0 g/dL   HCT 16.144.8 09.636.0 - 04.546.0 %   MCV 97.0 80.0 - 100.0 fL   MCH 29.4 26.0 - 34.0 pg   MCHC 30.4 30.0 - 36.0 g/dL   RDW 40.912.8 81.111.5 - 91.415.5 %   Platelets 243 150 - 400 K/uL   nRBC 0.0 0.0 - 0.2 %    Comment: Performed at [facility], [address] [place], [place] [address]   Basic metabolic panel     Status: Abnormal   Collection Time: 05/27/19  5:34 AM  Result Value Ref Range   Sodium 139 135 - 145 mmol/L   Potassium 4.0 3.5 - 5.1 mmol/L   Chloride 107 98 - 111 mmol/L   CO2 24 22 - 32 mmol/L   Glucose, Bld 192 (H) 70 - 99 mg/dL   BUN 16 6 - 20 mg/dL   Creatinine, Ser 6.210.70 0.44 - 1.00 mg/dL   Calcium 8.7 (L) 8.9 - 10.3 mg/dL   GFR calc non Af Amer >60 >60 mL/min   GFR calc Af Amer >60 >60 mL/min   Anion gap 8 5 - 15    Comment: Performed at [facility], [address] [place], [place] [address]  CBC     Status: None   Collection Time: 05/27/19  5:34 AM  Result Value Ref Range   WBC 8.9 4.0 - 10.5 K/uL   RBC 4.28 3.87 - 5.11 MIL/uL   Hemoglobin 12.9 12.0 - 15.0 g/dL   HCT 78.441.1 69.636.0 - 29.546.0 %   MCV 96.0 80.0 - 100.0 fL   MCH 30.1 26.0 - 34.0 pg   MCHC 31.4 30.0 - 36.0 g/dL   RDW 28.412.8 13.211.5 - 44.015.5 %   Platelets 252 150 - 400 K/uL   nRBC 0.0 0.0 - 0.2 %    Comment: Performed at [facility], [address] [place], [place] [address]  SARS CORONAVIRUS 2 Nasal Swab Aptima Multi Swab     Status: None   Collection Time: 06/16/19 11:39 AM   Specimen: Aptima Multi Swab; Nasal Swab  Result Value Ref Range   SARS Coronavirus 2 NEGATIVE NEGATIVE    Comment: (NOTE) SARS-CoV-2 target nucleic acids are NOT DETECTED. The SARS-CoV-2 RNA is generally detectable in upper and lower respiratory specimens during the acute phase of infection. Negative results do not preclude SARS-CoV-2 infection, do not rule out co-infections with other pathogens, and should not be used as the sole basis for treatment or other patient management decisions. Negative results must be combined with clinical observations, patient history, and epidemiological information. The expected result is Negative. Fact Sheet for Patients: HairSlick.nohttps://www.fda.gov/media/138098/download Fact Sheet for Healthcare  Providers: quierodirigir.comhttps://www.fda.gov/media/138095/download This test is not yet approved or cleared by the [place] FDA and  has been authorized for detection and/or diagnosis of SARS-CoV-2 by FDA under an Emergency Use Authorization (EUA). This EUA will remain  in effect (meaning this test can be used) for the duration of the COVID-19 declaration under Section 56 4(b)(1) of the Act, 21 U.S.C. section 360bbb-3(b)(1), unless the authorization is terminated or revoked sooner. Performed at [facility], [address] [place], [place] [address]   Pregnancy, urine POC     Status: None   Collection Time: 06/19/19 10:17 AM  Result Value Ref Range   Preg Test, Ur NEGATIVE NEGATIVE    Comment:        THE SENSITIVITY OF THIS METHODOLOGY  IS >24 mIU/mL        Psychiatric Specialty Exam: Physical Exam  ROS  There were no vitals taken for this visit.There is no height or weight on file to calculate BMI.  General Appearance: NA  Eye Contact:  NA  Speech:  Slow  Volume:  Decreased  Mood:  Anxious and Dysphoric  Affect:  NA  Thought Process:  Descriptions of Associations: Intact  Orientation:  Full (Time, Place, and Person)  Thought Content:  Rumination  Suicidal Thoughts:  No  Homicidal Thoughts:  No  Memory:  Immediate;   Fair Recent;   Fair Remote;   Good  Judgement:  Good  Insight:  Good  Psychomotor Activity:  NA  Concentration:  Concentration: Fair and Attention Span: Fair  Recall:  Good  Fund of Knowledge:  Fair  Language:  Good  Akathisia:  No  Handed:  Right  AIMS (if indicated):     Assets:  Communication Skills Desire for Improvement Housing Resilience Social Support  ADL's:  Intact  Cognition:  WNL  Sleep:   fair      Assessment and Plan: Major depressive disorder, recurrent.  Social anxiety disorder.  Attention deficit disorder, inattentive type.  I review her recent lab work.  Her creatinine and BUN is better.  I told not to take Percocet with  Adderall.  Patient is no longer taking Percocet because she feels her pain is much better.  She is sad about her dog.  I offered therapy but she declined.  She has no tremors, shakes or any EPS.  Continue Effexor XR 75 mg daily,Prozac 40 mg daily, BuSpar 30 mg daily, Seroquel 100 mg at bedtime and Adderall 20 mg twice a day.  Discussed medication side effects and benefits specially stimulant abuse, withdrawal, dependency.  Recommended to call us back if she has any question or any concern.  Follow-up in 2 months.  Follow Up Instructions:    I discussed the assessment and treatment plan with the patient. The patient was provided an opportunity to ask questions and all were answered. The patient agreed with the plan and demonstrated an understanding of the instructions.   The patient was advised to call back or seek an in-person evaluation if the symptoms worsen or if the condition fails to improve as anticipated.  I provided 20 minutes of non-face-to-face time during this encounter.   Cleotis NipperSyed T Flower Franko, MD

## 2019-06-24 ENCOUNTER — Ambulatory Visit: Payer: Self-pay

## 2019-07-01 ENCOUNTER — Ambulatory Visit: Payer: Medicare HMO

## 2019-07-01 DIAGNOSIS — N200 Calculus of kidney: Secondary | ICD-10-CM | POA: Diagnosis not present

## 2019-07-01 DIAGNOSIS — R350 Frequency of micturition: Secondary | ICD-10-CM | POA: Diagnosis not present

## 2019-07-09 ENCOUNTER — Other Ambulatory Visit: Payer: Self-pay | Admitting: Family Medicine

## 2019-08-04 ENCOUNTER — Encounter

## 2019-08-04 ENCOUNTER — Ambulatory Visit: Payer: Medicare HMO | Admitting: Adult Health

## 2019-08-12 ENCOUNTER — Other Ambulatory Visit (HOSPITAL_COMMUNITY): Payer: Self-pay | Admitting: Psychiatry

## 2019-08-12 DIAGNOSIS — F4010 Social phobia, unspecified: Secondary | ICD-10-CM

## 2019-08-15 DIAGNOSIS — N200 Calculus of kidney: Secondary | ICD-10-CM | POA: Diagnosis not present

## 2019-08-18 ENCOUNTER — Encounter (HOSPITAL_COMMUNITY): Payer: Self-pay | Admitting: Psychiatry

## 2019-08-18 ENCOUNTER — Other Ambulatory Visit: Payer: Self-pay

## 2019-08-18 ENCOUNTER — Ambulatory Visit: Payer: Medicare HMO | Admitting: Neurology

## 2019-08-18 ENCOUNTER — Ambulatory Visit (INDEPENDENT_AMBULATORY_CARE_PROVIDER_SITE_OTHER): Payer: Medicare HMO | Admitting: Psychiatry

## 2019-08-18 DIAGNOSIS — F332 Major depressive disorder, recurrent severe without psychotic features: Secondary | ICD-10-CM

## 2019-08-18 DIAGNOSIS — F900 Attention-deficit hyperactivity disorder, predominantly inattentive type: Secondary | ICD-10-CM

## 2019-08-18 DIAGNOSIS — F4010 Social phobia, unspecified: Secondary | ICD-10-CM | POA: Diagnosis not present

## 2019-08-18 MED ORDER — BUSPIRONE HCL 30 MG PO TABS: 30.0000 mg | ORAL_TABLET | Freq: Two times a day (BID) | ORAL | 2 refills | Status: DC

## 2019-08-18 MED ORDER — VENLAFAXINE HCL ER 75 MG PO CP24: 75.0000 mg | ORAL_CAPSULE | Freq: Every day | ORAL | 2 refills | Status: DC

## 2019-08-18 MED ORDER — AMPHETAMINE-DEXTROAMPHETAMINE 10 MG PO TABS: ORAL_TABLET | ORAL | 0 refills | Status: DC

## 2019-08-18 MED ORDER — QUETIAPINE FUMARATE 100 MG PO TABS: ORAL_TABLET | ORAL | 2 refills | Status: DC

## 2019-08-18 MED ORDER — FLUOXETINE HCL 40 MG PO CAPS: 40.0000 mg | ORAL_CAPSULE | Freq: Every day | ORAL | 2 refills | Status: DC

## 2019-08-18 NOTE — Progress Notes (Signed)
Virtual Visit via Telephone Note  I connected with Rebecca Andrade on 08/18/19 at 11:00 AM EDT by telephone and verified that I am speaking with the correct person using two identifiers.   I discussed the limitations, risks, security and privacy concerns of performing an evaluation and management service by telephone and the availability of in person appointments. I also discussed with the patient that there may be a patient responsible charge related to this service. The patient expressed understanding and agreed to proceed.   History of Present Illness: Patient was evaluated by phone session.  She is taking her medication as prescribed.  She has been sad as month ago she has to put her 49 year old daughter and she was crying.  Overall she feels the medicine is working very well.  She is able to do multitasking.  She is sleeping good.  She has not taken Percocet because she does not have any kidney infection but she recently saw nephrologist/urologist and still in the process of find out why she is having repeated UTI.  She has 24-hour urine sample and waiting for the results.  She denies any irritability, feeling of hopelessness or worthlessness.  She has no rash, itching tremors shakes or any EPS.  She is compliant with Effexor, Prozac, BuSpar, Adderall and Seroquel.  She admitted anxiety leaving her house and does not feel comfortable at crowded places.  She states most of the time by herself.  She lives with her son who is very supportive.  Patient denies drinking or using any illegal substances.  Currently level is good.  Her appetite is okay.  Her weight is a stable.  Past Psychiatric History:Reviewed. H/Otaking antidepressants since 1990. H/Oinpatient twice,onein 2005 andin2014.H/Ococaine use,paranoia, hallucination, psychosis, mood swing, anger and mania.No h/osuicidal attempt.TriedZoloft, Paxil, Lexapro, Wellbutrin, lithium, Remeron, Topamax, Abilify, Pristiq, Effexor,  amitriptyline, Xanax, temazepam, Valium and Cymbalta. PCPprscribed Adderall for ADD.   Psychiatric Specialty Exam: Physical Exam  ROS  There were no vitals taken for this visit.There is no height or weight on file to calculate BMI.  General Appearance: NA  Eye Contact:  NA  Speech:  Slow  Volume:  Decreased  Mood:  Anxious  Affect:  NA  Thought Process:  Descriptions of Associations: Intact  Orientation:  Full (Time, Place, and Person)  Thought Content:  WDL  Suicidal Thoughts:  No  Homicidal Thoughts:  No  Memory:  Immediate;   Fair Recent;   Fair Remote;   Good  Judgement:  Good  Insight:  Good  Psychomotor Activity:  NA  Concentration:  Concentration: Fair and Attention Span: Fair  Recall:  [employer] of Knowledge:  Fair  Language:  Good  Akathisia:  No  Handed:  Right  AIMS (if indicated):     Assets:  Communication Skills Desire for Improvement Resilience  ADL's:  Intact  Cognition:  WNL  Sleep:   ok      Assessment and Plan: Major depressive disorder, recurrent.  Social anxiety disorder.  Attention deficit disorder, inattentive type.  Patient is a stable on her current medication.  One more time recommended not to take any narcotic pain medication with a stimulant.  She is sad about dog but she is not ready at this time to get a #1.  I recommended therapy but patient does not feel she needed.  She like to continue current medication.  She has no tremors shakes or any EPS.  Continue Seroquel 100 mg at bedtime, Adderall 20 mg twice a day,  Effexor XR 75 mg daily, Prozac 40 mg daily and BuSpar 30 mg daily.  Discussed medication side effects especially stimulant abuse, dependency and withdrawal.  Recommended to call us back if she has any question of any concern.  Follow-up in 3 months.  Patient was evaluated by phone session.  She is not taking Adderall every day and noticed improvement in her attention, focus and multitasking.  She reported lately very busy  because  Follow Up Instructions:    I discussed the assessment and treatment plan with the patient. The patient was provided an opportunity to ask questions and all were answered. The patient agreed with the plan and demonstrated an understanding of the instructions.   The patient was advised to call back or seek an in-person evaluation if the symptoms worsen or if the condition fails to improve as anticipated.  I provided 20 minutes of non-face-to-face time during this encounter.   Cleotis Nipper, MD
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
## 2019-08-19 ENCOUNTER — Encounter: Payer: Self-pay | Admitting: Neurology

## 2019-08-19 ENCOUNTER — Other Ambulatory Visit: Payer: Self-pay

## 2019-08-19 ENCOUNTER — Ambulatory Visit: Payer: Medicare HMO | Admitting: Neurology

## 2019-08-19 VITALS — BP 141/103 | HR 86 | Temp 97.8°F | Ht 67.0 in | Wt 192.6 lb

## 2019-08-19 DIAGNOSIS — G43019 Migraine without aura, intractable, without status migrainosus: Secondary | ICD-10-CM

## 2019-08-19 MED ORDER — PROMETHAZINE HCL 50 MG PO TABS: 50.0000 mg | ORAL_TABLET | Freq: Three times a day (TID) | ORAL | 0 refills | Status: DC | PRN

## 2019-08-19 MED ORDER — MECLIZINE HCL 25 MG PO TABS: ORAL_TABLET | ORAL | 3 refills | Status: DC

## 2019-08-19 MED ORDER — AIMOVIG 70 MG/ML ~~LOC~~ SOAJ: 70.0000 mg | SUBCUTANEOUS | 5 refills | Status: DC

## 2019-08-19 MED ORDER — BACLOFEN 10 MG PO TABS: 10.0000 mg | ORAL_TABLET | Freq: Two times a day (BID) | ORAL | 5 refills | Status: DC

## 2019-08-19 NOTE — Patient Instructions (Signed)
1. Increase baclofen 10 mg twice a day 2. Discuss with your primary doctor if they can given you Aimovig in the office  3. Discuss with urologist, is there a reason toradol was d/c, should not be continued

## 2019-08-19 NOTE — Progress Notes (Signed)
I have read the note, and I agree with the clinical assessment and plan.  Janzen Sacks K Eriana Suliman   

## 2019-08-28 DIAGNOSIS — N200 Calculus of kidney: Secondary | ICD-10-CM | POA: Diagnosis not present

## 2019-08-29 ENCOUNTER — Encounter: Payer: Self-pay | Admitting: Family Medicine

## 2019-08-29 ENCOUNTER — Other Ambulatory Visit: Payer: Self-pay

## 2019-08-29 ENCOUNTER — Ambulatory Visit (INDEPENDENT_AMBULATORY_CARE_PROVIDER_SITE_OTHER): Payer: Medicare HMO | Admitting: Family Medicine

## 2019-08-29 VITALS — BP 122/78 | HR 96 | Wt 189.8 lb

## 2019-08-29 DIAGNOSIS — M7522 Bicipital tendinitis, left shoulder: Secondary | ICD-10-CM

## 2019-08-29 DIAGNOSIS — M79622 Pain in left upper arm: Secondary | ICD-10-CM

## 2019-08-29 MED ORDER — DICLOFENAC SODIUM 1 % TD GEL: 2.0000 g | Freq: Four times a day (QID) | TRANSDERMAL | 1 refills | Status: DC

## 2019-08-29 NOTE — Patient Instructions (Signed)
Try taking Tylenol 1,000 mg every 12 hours for the next week.   Use the Voltaren gel as prescribed. You may also use ice 2-3 times per day.   Let me know in 2 weeks if you are not much better or if you are getting worse.

## 2019-08-29 NOTE — Progress Notes (Signed)
   Subjective:    Patient ID: Rebecca Andrade, female    DOB: 01/21/1971, 48 y.o.   MRN: [id number]  HPI Chief Complaint  Patient presents with  . left arm pain    left arm pain- constant burning pain. hurts to move. times a couple months   Complains of left upper arm pain for the past 3 months. Burning sensation that is constant and runs from just below her left shoulder to her mid left forearm.  No known injury. She had to lift her dog a lot prior to the onset of symptoms.  States nothing aggravates or alleviates her pain.   She has tried ibuprofen 2 tablets only.  Denies neck pain or injury.   Denies fever, chills, headache, dizziness, chest pain, palpitations, shortness of breath, cough, abdominal pain, N/V/D.   Reviewed allergies, medications, past medical, surgical, family, and social history.    Review of Systems Pertinent positives and negatives in the history of present illness.     Objective:   Physical Exam Constitutional:      General: She is not in acute distress.    Appearance: Normal appearance.  Neck:     Musculoskeletal: Normal range of motion and neck supple.  Cardiovascular:     Rate and Rhythm: Normal rate and regular rhythm.     Pulses: Normal pulses.     Heart sounds: Normal heart sounds.  Pulmonary:     Effort: Pulmonary effort is normal.     Breath sounds: Normal breath sounds.  Musculoskeletal:     Left shoulder: She exhibits normal range of motion, no bony tenderness, no swelling and normal strength.     Cervical back: Normal.     Left upper arm: She exhibits tenderness. She exhibits no swelling.     Comments: Bicep tendon TTP is marked but appears intact.  Negative Neers and Hawkins. Negative drop arm test. Bilateral upper extremity strength is equal.   Lymphadenopathy:     Cervical: No cervical adenopathy.  Skin:    General: Skin is warm and dry.     Findings: No bruising or rash.  Neurological:     General: No focal deficit present.   Mental Status: She is alert and oriented to person, place, and time.     Cranial Nerves: No cranial nerve deficit.     Sensory: No sensory deficit.     Motor: No weakness.    BP 122/78   Pulse 96   Wt 189 lb 12.8 oz (86.1 kg)   BMI 29.73 kg/m       Assessment & Plan:  Biceps tendinitis of left shoulder - Plan: diclofenac sodium (VOLTAREN) 1 % GEL  Left upper arm pain  No red flag symptoms. Discussed differentials including biceps tendonitis vs rotator cuff but no sign of shoulder instability. Apparently this has been ongoing for 3 months and not worsening.  Will try Tylenol 1,000 mg twice daily since she is taking a SSRI and NSAIDs increase risk of serotonin syndrome. Tylenol samples provided.  She will also try topical Voltaren and ice as needed. Discussed referral to orthopedist and we will hold off for 2 weeks to see if symptoms calm down at all.

## 2019-09-08 ENCOUNTER — Other Ambulatory Visit: Payer: Self-pay | Admitting: Family Medicine

## 2019-09-08 DIAGNOSIS — Z1231 Encounter for screening mammogram for malignant neoplasm of breast: Secondary | ICD-10-CM

## 2019-09-11 ENCOUNTER — Other Ambulatory Visit (HOSPITAL_COMMUNITY): Payer: Self-pay | Admitting: Psychiatry

## 2019-09-11 DIAGNOSIS — F4010 Social phobia, unspecified: Secondary | ICD-10-CM

## 2019-09-15 ENCOUNTER — Ambulatory Visit (INDEPENDENT_AMBULATORY_CARE_PROVIDER_SITE_OTHER): Payer: Medicare HMO | Admitting: Family Medicine

## 2019-09-15 ENCOUNTER — Other Ambulatory Visit: Payer: Self-pay

## 2019-09-15 ENCOUNTER — Encounter: Payer: Self-pay | Admitting: Family Medicine

## 2019-09-15 VITALS — BP 128/70 | HR 78 | Temp 98.2°F | Wt 191.4 lb

## 2019-09-15 DIAGNOSIS — M25512 Pain in left shoulder: Secondary | ICD-10-CM

## 2019-09-15 DIAGNOSIS — M79622 Pain in left upper arm: Secondary | ICD-10-CM

## 2019-09-15 NOTE — Progress Notes (Signed)
   Subjective:    Patient ID: Rebecca Andrade, female    DOB: 06/07/1971, 48 y.o.   MRN: [id number]  HPI Chief Complaint  Patient presents with  . left shoulder pain    left shoulder pain   Here for a 2 week follow up on 4 month history left arm pain. No known injury.  Sates conservative treatment has not helped at all. States pain is waking her up at night.  Reports a "burning sensation" that is constant and runs from just below her left shoulder to her mid left forearm.  She had to lift her dog a lot prior to the onset of symptoms.  States nothing aggravates or alleviates her pain.   Denies fever, chills, chest pain, palpitations, shortness of breath, abdominal pain, N/V/D, urinary symptoms, LE edema.  Denies numbness, tingling or weakness    Review of Systems Pertinent positives and negatives in the history of present illness.     Objective:   Physical Exam BP 128/70   Pulse 78   Temp 98.2 F (36.8 C)   Wt 191 lb 6.4 oz (86.8 kg)   BMI 29.98 kg/m   Left shoulder with normal ROM but pain with resistance, especially with flexion. Left arm with normal sensation, pulse, capillary refill, ROM and strength compared to right arm.       Assessment & Plan:  Left upper arm pain - Plan: Ambulatory referral to Orthopedic Surgery  Acute pain of left shoulder - Plan: Ambulatory referral to Orthopedic Surgery  No improvement with Tylenol and topical NSAID. Did not recommend oral NSAID due to potential medication interaction. Refer to orthopedist for further evaluation.

## 2019-09-19 ENCOUNTER — Other Ambulatory Visit (HOSPITAL_COMMUNITY): Payer: Self-pay | Admitting: Psychiatry

## 2019-09-19 ENCOUNTER — Telehealth (HOSPITAL_COMMUNITY): Payer: Self-pay | Admitting: *Deleted

## 2019-09-19 DIAGNOSIS — N200 Calculus of kidney: Secondary | ICD-10-CM | POA: Diagnosis not present

## 2019-09-19 DIAGNOSIS — F900 Attention-deficit hyperactivity disorder, predominantly inattentive type: Secondary | ICD-10-CM

## 2019-09-19 MED ORDER — AMPHETAMINE-DEXTROAMPHETAMINE 10 MG PO TABS: ORAL_TABLET | ORAL | 0 refills | Status: DC

## 2019-09-19 NOTE — Telephone Encounter (Signed)
Pt called requesting a refill on her Adderall.

## 2019-09-24 ENCOUNTER — Ambulatory Visit: Payer: Medicare HMO | Admitting: Family Medicine

## 2019-09-30 ENCOUNTER — Ambulatory Visit: Payer: Medicare HMO | Admitting: Family Medicine

## 2019-10-01 ENCOUNTER — Other Ambulatory Visit (HOSPITAL_COMMUNITY): Payer: Self-pay | Admitting: Psychiatry

## 2019-10-01 DIAGNOSIS — F332 Major depressive disorder, recurrent severe without psychotic features: Secondary | ICD-10-CM

## 2019-10-06 DIAGNOSIS — N200 Calculus of kidney: Secondary | ICD-10-CM | POA: Diagnosis not present

## 2019-10-12 ENCOUNTER — Other Ambulatory Visit (HOSPITAL_COMMUNITY): Payer: Self-pay | Admitting: Psychiatry

## 2019-10-12 DIAGNOSIS — F4010 Social phobia, unspecified: Secondary | ICD-10-CM

## 2019-10-17 ENCOUNTER — Telehealth (HOSPITAL_COMMUNITY): Payer: Self-pay | Admitting: *Deleted

## 2019-10-17 DIAGNOSIS — F900 Attention-deficit hyperactivity disorder, predominantly inattentive type: Secondary | ICD-10-CM

## 2019-10-17 MED ORDER — AMPHETAMINE-DEXTROAMPHETAMINE 10 MG PO TABS: ORAL_TABLET | ORAL | 0 refills | Status: DC

## 2019-10-17 NOTE — Telephone Encounter (Signed)
Pt called requesting refill on the Adderall. Upcoming appointment on 11/11/19. Please review and advise.

## 2019-10-22 ENCOUNTER — Telehealth (HOSPITAL_COMMUNITY): Payer: Self-pay

## 2019-10-22 NOTE — Telephone Encounter (Signed)
[employer] PRES[employer] C[employer] APPROVED  AMPHETAMINE-DEXTROAMPHETAMINE 10MG  TABLET REFERENCE# [id number] EFFECTIVE 10/22/2019 TO 11/05/2020

## 2019-10-25 ENCOUNTER — Other Ambulatory Visit: Payer: Self-pay | Admitting: Family Medicine

## 2019-10-28 ENCOUNTER — Ambulatory Visit
Admission: RE | Admit: 2019-10-28 | Discharge: 2019-10-28 | Disposition: A | Discharge status: Home / Self Care | Payer: Medicare HMO | Source: Ambulatory Visit | Attending: Family Medicine | Admitting: Family Medicine

## 2019-10-28 ENCOUNTER — Other Ambulatory Visit: Payer: Self-pay

## 2019-10-28 DIAGNOSIS — Z1231 Encounter for screening mammogram for malignant neoplasm of breast: Secondary | ICD-10-CM

## 2019-11-09 ENCOUNTER — Other Ambulatory Visit (HOSPITAL_COMMUNITY): Payer: Self-pay | Admitting: Psychiatry

## 2019-11-09 DIAGNOSIS — F4010 Social phobia, unspecified: Secondary | ICD-10-CM

## 2019-11-11 ENCOUNTER — Other Ambulatory Visit: Payer: Self-pay

## 2019-11-11 ENCOUNTER — Encounter (HOSPITAL_COMMUNITY): Payer: Self-pay | Admitting: Psychiatry

## 2019-11-11 ENCOUNTER — Ambulatory Visit (INDEPENDENT_AMBULATORY_CARE_PROVIDER_SITE_OTHER): Payer: Medicare HMO | Admitting: Psychiatry

## 2019-11-11 DIAGNOSIS — F332 Major depressive disorder, recurrent severe without psychotic features: Secondary | ICD-10-CM | POA: Diagnosis not present

## 2019-11-11 DIAGNOSIS — F900 Attention-deficit hyperactivity disorder, predominantly inattentive type: Secondary | ICD-10-CM | POA: Diagnosis not present

## 2019-11-11 DIAGNOSIS — F4010 Social phobia, unspecified: Secondary | ICD-10-CM | POA: Diagnosis not present

## 2019-11-11 MED ORDER — BUSPIRONE HCL 30 MG PO TABS: 30.0000 mg | ORAL_TABLET | Freq: Two times a day (BID) | ORAL | 2 refills | Status: DC

## 2019-11-11 MED ORDER — AMPHETAMINE-DEXTROAMPHETAMINE 10 MG PO TABS: ORAL_TABLET | ORAL | 0 refills | Status: DC

## 2019-11-11 MED ORDER — QUETIAPINE FUMARATE 100 MG PO TABS: ORAL_TABLET | ORAL | 2 refills | Status: DC

## 2019-11-11 MED ORDER — FLUOXETINE HCL 40 MG PO CAPS: 40.0000 mg | ORAL_CAPSULE | Freq: Every day | ORAL | 2 refills | Status: DC

## 2019-11-11 MED ORDER — VENLAFAXINE HCL ER 75 MG PO CP24: 75.0000 mg | ORAL_CAPSULE | Freq: Every day | ORAL | 2 refills | Status: DC

## 2019-11-11 NOTE — Progress Notes (Signed)
Virtual Visit via Telephone Note  I connected with Rebecca Andrade on 11/11/19 at  9:20 AM EST by telephone and verified that I am speaking with the correct person using two identifiers.   I discussed the limitations, risks, security and privacy concerns of performing an evaluation and management service by telephone and the availability of in person appointments. I also discussed with the patient that there may be a patient responsible charge related to this service. The patient expressed understanding and agreed to proceed.   History of Present Illness: Patient was evaluated by phone session.  She had a good Christmas.  Her anxiety is under control.  She does not leave the house because she gets very anxious and nervous around public places.  Recently she had a puppy and she really enjoyed the company of the puppy.  She denies any irritability, anger, feeling of hopelessness or any crying spells.  She is sleeping good.  Her attention, focus is good.  She is taking multiple medication but she feels it is working and she does not have any severe anxiety or depression.  She has no tremors, shakes or any EPS.  Since she had a puppy she does not feel as lonely.  Her son also lives with her who also works at [employer] and lately son is very busy at his job.  Patient denies drinking or using any illegal substances.  Her appetite is okay.  Her weight is stable.   Past Psychiatric History:Reviewed. H/Otaking antidepressants since 1990. H/Oinpatient twice,onein 2005 andin2014.H/Ococaine use,paranoia, hallucination, mood swing, anger and mania.No h/osuicidal attempt.TriedZoloft, Paxil, Lexapro, Wellbutrin, lithium, Remeron, Topamax, Abilify, Pristiq, Effexor, amitriptyline, Xanax, temazepam, Valium and Cymbalta. PCPprscribed Adderall for ADD.    Psychiatric Specialty Exam: Physical Exam  Review of Systems  There were no vitals taken for this visit.There is no height or weight on file to  calculate BMI.  General Appearance: NA  Eye Contact:  NA  Speech:  Slow  Volume:  Decreased  Mood:  Euthymic  Affect:  NA  Thought Process:  Descriptions of Associations: Intact  Orientation:  Full (Time, Place, and Person)  Thought Content:  WDL  Suicidal Thoughts:  No  Homicidal Thoughts:  No  Memory:  Immediate;   Fair Recent;   Fair Remote;   Fair  Judgement:  Good  Insight:  Present  Psychomotor Activity:  NA  Concentration:  Concentration: Fair and Attention Span: Fair  Recall:  [employer] of Knowledge:  Fair  Language:  Good  Akathisia:  No  Handed:  Right  AIMS (if indicated):     Assets:  Communication Skills Desire for Improvement Housing Social Support Transportation  ADL's:  Intact  Cognition:  WNL  Sleep:   good      Assessment and Plan: Major depressive disorder, recurrent.  Social anxiety disorder.  Attention deficit disorder, inattentive type.  Patient is stable on her current medication.  She is doing very well since she got a new puppy.  Patient does not want to change medication..  Continue Seroquel 100 mg at bedtime, Adderall 20 mg twice a day, Effexor XR 75 mg daily, Prozac 40 mg daily and BuSpar 30 mg daily.  Discussed polypharmacy but patient reluctant to cut down the medication since it is working very well.  Recommended to call us back if she is any question or any concern.  Follow-up in 3 months.  Follow Up Instructions:    I discussed the assessment and treatment plan with the patient.  The patient was provided an opportunity to ask questions and all were answered. The patient agreed with the plan and demonstrated an understanding of the instructions.   The patient was advised to call back or seek an in-person evaluation if the symptoms worsen or if the condition fails to improve as anticipated.  I provided 20 minutes of non-face-to-face time during this encounter.   Kathlee Nations, MD

## 2019-11-13 ENCOUNTER — Ambulatory Visit: Payer: Medicare HMO | Admitting: Family Medicine

## 2019-12-03 DIAGNOSIS — H52209 Unspecified astigmatism, unspecified eye: Secondary | ICD-10-CM | POA: Diagnosis not present

## 2019-12-03 DIAGNOSIS — H524 Presbyopia: Secondary | ICD-10-CM | POA: Diagnosis not present

## 2019-12-03 DIAGNOSIS — H5203 Hypermetropia, bilateral: Secondary | ICD-10-CM | POA: Diagnosis not present

## 2019-12-03 DIAGNOSIS — H5213 Myopia, bilateral: Secondary | ICD-10-CM | POA: Diagnosis not present

## 2019-12-19 ENCOUNTER — Telehealth (HOSPITAL_COMMUNITY): Payer: Self-pay

## 2019-12-19 DIAGNOSIS — F900 Attention-deficit hyperactivity disorder, predominantly inattentive type: Secondary | ICD-10-CM

## 2019-12-19 MED ORDER — AMPHETAMINE-DEXTROAMPHETAMINE 10 MG PO TABS: ORAL_TABLET | ORAL | 0 refills | Status: DC

## 2019-12-19 NOTE — Telephone Encounter (Signed)
Done. She can pick up on Sunday when she is due.

## 2019-12-19 NOTE — Telephone Encounter (Signed)
Patient called requesting a refill on her Adderall 10mg  to be sent to [facility] on [address] in [place]. She's out and doesn't have any for the weekend. Thank you.

## 2020-01-15 ENCOUNTER — Other Ambulatory Visit: Payer: Self-pay

## 2020-01-15 ENCOUNTER — Ambulatory Visit (INDEPENDENT_AMBULATORY_CARE_PROVIDER_SITE_OTHER): Payer: Medicare HMO | Admitting: Family Medicine

## 2020-01-15 ENCOUNTER — Encounter: Payer: Self-pay | Admitting: Family Medicine

## 2020-01-15 VITALS — BP 110/70 | HR 100 | Temp 97.1°F | Wt 199.2 lb

## 2020-01-15 DIAGNOSIS — Z113 Encounter for screening for infections with a predominantly sexual mode of transmission: Secondary | ICD-10-CM

## 2020-01-15 DIAGNOSIS — M2550 Pain in unspecified joint: Secondary | ICD-10-CM | POA: Insufficient documentation

## 2020-01-15 DIAGNOSIS — R61 Generalized hyperhidrosis: Secondary | ICD-10-CM | POA: Diagnosis not present

## 2020-01-15 DIAGNOSIS — N912 Amenorrhea, unspecified: Secondary | ICD-10-CM | POA: Diagnosis not present

## 2020-01-15 DIAGNOSIS — R110 Nausea: Secondary | ICD-10-CM | POA: Diagnosis not present

## 2020-01-15 LAB — POCT URINALYSIS DIP (PROADVANTAGE DEVICE)
Bilirubin, UA: NEGATIVE
Blood, UA: NEGATIVE
Glucose, UA: NEGATIVE mg/dL
Ketones, POC UA: NEGATIVE mg/dL
Leukocytes, UA: NEGATIVE
Nitrite, UA: NEGATIVE
Protein Ur, POC: NEGATIVE mg/dL
Specific Gravity, Urine: 1.025
Urobilinogen, Ur: NEGATIVE
pH, UA: 6 (ref 5.0–8.0)

## 2020-01-15 LAB — POCT URINE PREGNANCY: Preg Test, Ur: NEGATIVE

## 2020-01-15 NOTE — Patient Instructions (Signed)
You may try over the counter Estroven for menopause or just a supplement called Black Cohosh    Menopause Menopause is the normal time of life when menstrual periods stop completely. It is usually confirmed by 12 months without a menstrual period. The transition to menopause (perimenopause) most often happens between the ages of 60 and 81. During perimenopause, hormone levels change in your body, which can cause symptoms and affect your health. Menopause may increase your risk for:  Loss of bone (osteoporosis), which causes bone breaks (fractures).  Depression.  Hardening and narrowing of the arteries (atherosclerosis), which can cause heart attacks and strokes. What are the causes? This condition is usually caused by a natural change in hormone levels that happens as you get older. The condition may also be caused by surgery to remove both ovaries (bilateral oophorectomy). What increases the risk? This condition is more likely to start at an earlier age if you have certain medical conditions or treatments, including:  A tumor of the pituitary gland in the brain.  A disease that affects the ovaries and hormone production.  Radiation treatment for cancer.  Certain cancer treatments, such as chemotherapy or hormone (anti-estrogen) therapy.  Heavy smoking and excessive alcohol use.  Family history of early menopause. This condition is also more likely to develop earlier in women who are very thin. What are the signs or symptoms? Symptoms of this condition include:  Hot flashes.  Irregular menstrual periods.  Night sweats.  Changes in feelings about sex. This could be a decrease in sex drive or an increased comfort around your sexuality.  Vaginal dryness and thinning of the vaginal walls. This may cause painful intercourse.  Dryness of the skin and development of wrinkles.  Headaches.  Problems sleeping (insomnia).  Mood swings or irritability.  Memory problems.  Weight  gain.  Hair growth on the face and chest.  Bladder infections or problems with urinating. How is this diagnosed? This condition is diagnosed based on your medical history, a physical exam, your age, your menstrual history, and your symptoms. Hormone tests may also be done. How is this treated? In some cases, no treatment is needed. You and your health care provider should make a decision together about whether treatment is necessary. Treatment will be based on your individual condition and preferences. Treatment for this condition focuses on managing symptoms. Treatment may include:  Menopausal hormone therapy (MHT).  Medicines to treat specific symptoms or complications.  Acupuncture.  Vitamin or herbal supplements. Before starting treatment, make sure to let your health care provider know if you have a personal or family history of:  Heart disease.  Breast cancer.  Blood clots.  Diabetes.  Osteoporosis. Follow these instructions at home: Lifestyle  Do not use any products that contain nicotine or tobacco, such as cigarettes and e-cigarettes. If you need help quitting, ask your health care provider.  Get at least 30 minutes of physical activity on 5 or more days each week.  Avoid alcoholic and caffeinated beverages, as well as spicy foods. This may help prevent hot flashes.  Get 7-8 hours of sleep each night.  If you have hot flashes, try: ? Dressing in layers. ? Avoiding things that may trigger hot flashes, such as spicy food, warm places, or stress. ? Taking slow, deep breaths when a hot flash starts. ? Keeping a fan in your home and office.  Find ways to manage stress, such as deep breathing, meditation, or journaling.  Consider going to group therapy with  other women who are having menopause symptoms. Ask your health care provider about recommended group therapy meetings. Eating and drinking  Eat a healthy, balanced diet that contains whole grains, lean protein,  low-fat dairy, and plenty of fruits and vegetables.  Your health care provider may recommend adding more soy to your diet. Foods that contain soy include tofu, tempeh, and soy milk.  Eat plenty of foods that contain calcium and vitamin D for bone health. Items that are rich in calcium include low-fat milk, yogurt, beans, almonds, sardines, broccoli, and kale. Medicines  Take over-the-counter and prescription medicines only as told by your health care provider.  Talk with your health care provider before starting any herbal supplements. If prescribed, take vitamins and supplements as told by your health care provider. These may include: ? Calcium. Women age 81 and older should get 1,200 mg (milligrams) of calcium every day. ? Vitamin D. Women need 600-800 International Units of vitamin D each day. ? Vitamins B12 and B6. Aim for 50 micrograms of B12 and 1.5 mg of B6 each day. General instructions  Keep track of your menstrual periods, including: ? When they occur. ? How heavy they are and how long they last. ? How much time passes between periods.  Keep track of your symptoms, noting when they start, how often you have them, and how long they last.  Use vaginal lubricants or moisturizers to help with vaginal dryness and improve comfort during sex.  Keep all follow-up visits as told by your health care provider. This is important. This includes any group therapy or counseling. Contact a health care provider if:  You are still having menstrual periods after age 75.  You have pain during sex.  You have not had a period for 12 months and you develop vaginal bleeding. Get help right away if:  You have: ? Severe depression. ? Excessive vaginal bleeding. ? Pain when you urinate. ? A fast or irregular heart beat (palpitations). ? Severe headaches. ? Abdomen (abdominal) pain or severe indigestion.  You fell and you think you have a broken bone.  You develop leg or chest pain.  You  develop vision problems.  You feel a lump in your breast. Summary  Menopause is the normal time of life when menstrual periods stop completely. It is usually confirmed by 12 months without a menstrual period.  The transition to menopause (perimenopause) most often happens between the ages of 66 and 81.  Symptoms can be managed through medicines, lifestyle changes, and complementary therapies such as acupuncture.  Eat a balanced diet that is rich in nutrients to promote bone health and heart health and to manage symptoms during menopause. This information is not intended to replace advice given to you by your health care provider. Make sure you discuss any questions you have with your health care provider. Document Revised: 10/05/2017 Document Reviewed: 11/25/2016 Elsevier Patient Education  2020 [employer].

## 2020-01-15 NOTE — Progress Notes (Signed)
   Subjective:    Patient ID: Rebecca Andrade, female    DOB: 07-10-1971, 49 y.o.   MRN: [id number]  HPI Chief Complaint  Patient presents with  . wants STD    STD and HIV. don't feel well, sweating, nausea,achy- couple months, no period for 4 months   Complains of a several month history of nausea, sweating day and night, arthralgias.  Questions whether she is going through menopause.   No fever, unexplained weight loss, fatigue, dizziness, headaches, chest pain, palpitations, shortness of breath, abdominal pain, vomiting, diarrhea or constipation, urinary or vaginal symptoms.  Good appetite and sleeping fine.    LMP: 4 months ago  Requests STD testing including HIV. States she likes to be tested yearly.   Reviewed allergies, medications, past medical, surgical, family, and social history.   Review of Systems Pertinent positives and negatives in the history of present illness.     Objective:   Physical Exam BP 110/70   Pulse 100   Temp (!) 97.1 F (36.2 C)   Wt 199 lb 3.2 oz (90.4 kg)   SpO2 98%   BMI 31.20 kg/m   Alert and in no distress. Neck is supple without adenopathy or thyromegaly. Cardiac exam shows a regular  rhythm without murmurs or gallops. Lungs are clear to auscultation.  Abdomen is soft, nondistended, normal bowel sounds, nontender, no rebound or guarding and no palpable masses.  Extremities without edema.  Skin is warm and dry.       Assessment & Plan:  Amenorrhea - Plan: CBC with Differential/Platelet, Comprehensive metabolic panel, POCT Urinalysis DIP (Proadvantage Device), POCT urine pregnancy, TSH, T4, free, T3, Estrogens, Total, FSH/LH -Urine pregnancy test is negative Discussed that she may be going through menopause.  Will check labs and follow-up.  She may try using over-the-counter black cohosh to see if this helps with her hot flashes.  Excessive sweating - Plan: CBC with Differential/Platelet, Comprehensive metabolic panel, POCT Urinalysis  DIP (Proadvantage Device), TSH, T4, free, T3, Sedimentation rate, Estrogens, Total, FSH/LH -Suspect she is having hot flashes.  She may try black cohosh and follow-up pending lab results  Nausea - Plan: POCT Urinalysis DIP (Proadvantage Device), POCT urine pregnancy, TSH, T4, free, T3 -She is still eating a regular amount of food and no vomiting or weight loss.  She actually has gained weight  Arthralgia of multiple joints - Plan: Sedimentation rate -No sign of inflammatory arthritis on exam.  Screen for STD (sexually transmitted disease) - Plan: RPR, HIV Antibody (routine testing w rflx), GC/Chlamydia Probe Amp, Trichomonas vaginalis, RNA -This was done per patient request.  She is asymptomatic

## 2020-01-16 ENCOUNTER — Telehealth (HOSPITAL_COMMUNITY): Payer: Self-pay

## 2020-01-16 DIAGNOSIS — F900 Attention-deficit hyperactivity disorder, predominantly inattentive type: Secondary | ICD-10-CM

## 2020-01-16 LAB — CBC WITH DIFFERENTIAL/PLATELET
Basophils Absolute: 0.1 10*3/uL (ref 0.0–0.2)
Basos: 1 %
EOS (ABSOLUTE): 0.2 10*3/uL (ref 0.0–0.4)
Eos: 2 %
Hematocrit: 47.2 % — ABNORMAL HIGH (ref 34.0–46.6)
Hemoglobin: 15.9 g/dL (ref 11.1–15.9)
Immature Grans (Abs): 0.1 10*3/uL (ref 0.0–0.1)
Immature Granulocytes: 1 %
Lymphocytes Absolute: 2.4 10*3/uL (ref 0.7–3.1)
Lymphs: 22 %
MCH: 30.2 pg (ref 26.6–33.0)
MCHC: 33.7 g/dL (ref 31.5–35.7)
MCV: 90 fL (ref 79–97)
Monocytes Absolute: 0.7 10*3/uL (ref 0.1–0.9)
Monocytes: 6 %
Neutrophils Absolute: 7.1 10*3/uL — ABNORMAL HIGH (ref 1.4–7.0)
Neutrophils: 68 %
Platelets: 264 10*3/uL (ref 150–450)
RBC: 5.26 x10E6/uL (ref 3.77–5.28)
RDW: 11.9 % (ref 11.7–15.4)
WBC: 10.6 10*3/uL (ref 3.4–10.8)

## 2020-01-16 LAB — FSH/LH
FSH: 71.6 m[IU]/mL
LH: 69 m[IU]/mL

## 2020-01-16 LAB — COMPREHENSIVE METABOLIC PANEL
ALT: 14 IU/L (ref 0–32)
AST: 14 IU/L (ref 0–40)
Albumin/Globulin Ratio: 1.5 (ref 1.2–2.2)
Albumin: 3.4 g/dL — ABNORMAL LOW (ref 3.8–4.8)
Alkaline Phosphatase: 83 IU/L (ref 39–117)
BUN/Creatinine Ratio: 17 (ref 9–23)
BUN: 17 mg/dL (ref 6–24)
Bilirubin Total: 0.2 mg/dL (ref 0.0–1.2)
CO2: 24 mmol/L (ref 20–29)
Calcium: 8.8 mg/dL (ref 8.7–10.2)
Chloride: 107 mmol/L — ABNORMAL HIGH (ref 96–106)
Creatinine, Ser: 1.01 mg/dL — ABNORMAL HIGH (ref 0.57–1.00)
GFR calc Af Amer: 76 mL/min/{1.73_m2} (ref >59)
GFR calc non Af Amer: 66 mL/min/{1.73_m2} (ref >59)
Globulin, Total: 2.3 g/dL (ref 1.5–4.5)
Glucose: 86 mg/dL (ref 65–99)
Potassium: 4.3 mmol/L (ref 3.5–5.2)
Sodium: 143 mmol/L (ref 134–144)
Total Protein: 5.7 g/dL — ABNORMAL LOW (ref 6.0–8.5)

## 2020-01-16 LAB — SEDIMENTATION RATE: Sed Rate: 7 mm/hr (ref 0–32)

## 2020-01-16 LAB — T4, FREE: Free T4: 1.13 ng/dL (ref 0.82–1.77)

## 2020-01-16 LAB — RPR: RPR Ser Ql: NONREACTIVE

## 2020-01-16 LAB — HIV ANTIBODY (ROUTINE TESTING W REFLEX): HIV Screen 4th Generation wRfx: NONREACTIVE

## 2020-01-16 LAB — ESTROGENS, TOTAL: Estrogen: 54 pg/mL

## 2020-01-16 LAB — T3: T3, Total: 98 ng/dL (ref 71–180)

## 2020-01-16 LAB — TSH: TSH: 1.13 u[IU]/mL (ref 0.450–4.500)

## 2020-01-16 NOTE — Progress Notes (Signed)
Her lab results so far show that she is in nearing post menopausal which does explain her symptoms. Her protein level is also low and I know she has cut out a lot of meat. She needs to find other sources of protein to improve this. Protein shakes that are low in carbs would be a good supplement but also finding other ways in her diet. She should also drink 6-8 -eight ounce glasses of water throughout the day. She is negative for HIV and syphilis. Still waiting other results and will call with these. Lets recheck her protein level in 3 months. We can do this as a medicare wellness/CPE and follow up on symptoms.

## 2020-01-16 NOTE — Telephone Encounter (Signed)
Patient called requesting a refill on her Adderall 10mg  [facility] in [place]. Thank you.

## 2020-01-18 LAB — GC/CHLAMYDIA PROBE AMP
Chlamydia trachomatis, NAA: NEGATIVE
Neisseria Gonorrhoeae by PCR: NEGATIVE

## 2020-01-18 LAB — TRICHOMONAS VAGINALIS, PROBE AMP: Trich vag by NAA: NEGATIVE

## 2020-01-19 MED ORDER — AMPHETAMINE-DEXTROAMPHETAMINE 10 MG PO TABS: ORAL_TABLET | ORAL | 0 refills | Status: DC

## 2020-01-19 NOTE — Telephone Encounter (Signed)
Done

## 2020-01-25 ENCOUNTER — Other Ambulatory Visit: Payer: Self-pay | Admitting: Family Medicine

## 2020-01-27 ENCOUNTER — Other Ambulatory Visit (HOSPITAL_COMMUNITY): Payer: Self-pay | Admitting: Psychiatry

## 2020-01-27 DIAGNOSIS — F4010 Social phobia, unspecified: Secondary | ICD-10-CM

## 2020-02-02 ENCOUNTER — Other Ambulatory Visit: Payer: Self-pay | Admitting: Family Medicine

## 2020-02-02 NOTE — Telephone Encounter (Signed)
This was just refilled on 3.22.21

## 2020-02-07 ENCOUNTER — Other Ambulatory Visit: Payer: Self-pay | Admitting: Neurology

## 2020-02-09 ENCOUNTER — Ambulatory Visit (HOSPITAL_COMMUNITY): Payer: Medicare HMO | Admitting: Psychiatry

## 2020-02-10 ENCOUNTER — Encounter (HOSPITAL_COMMUNITY): Payer: Self-pay | Admitting: Psychiatry

## 2020-02-10 ENCOUNTER — Other Ambulatory Visit: Payer: Self-pay

## 2020-02-10 ENCOUNTER — Ambulatory Visit (INDEPENDENT_AMBULATORY_CARE_PROVIDER_SITE_OTHER): Payer: Medicare HMO | Admitting: Psychiatry

## 2020-02-10 DIAGNOSIS — F4010 Social phobia, unspecified: Secondary | ICD-10-CM | POA: Diagnosis not present

## 2020-02-10 DIAGNOSIS — F332 Major depressive disorder, recurrent severe without psychotic features: Secondary | ICD-10-CM | POA: Diagnosis not present

## 2020-02-10 DIAGNOSIS — F900 Attention-deficit hyperactivity disorder, predominantly inattentive type: Secondary | ICD-10-CM | POA: Diagnosis not present

## 2020-02-10 MED ORDER — QUETIAPINE FUMARATE 100 MG PO TABS: ORAL_TABLET | ORAL | 2 refills | Status: DC

## 2020-02-10 MED ORDER — AMPHETAMINE-DEXTROAMPHETAMINE 10 MG PO TABS: ORAL_TABLET | ORAL | 0 refills | Status: DC

## 2020-02-10 MED ORDER — FLUOXETINE HCL 40 MG PO CAPS: 40.0000 mg | ORAL_CAPSULE | Freq: Every day | ORAL | 2 refills | Status: DC

## 2020-02-10 MED ORDER — BUSPIRONE HCL 30 MG PO TABS: 30.0000 mg | ORAL_TABLET | Freq: Two times a day (BID) | ORAL | 2 refills | Status: DC

## 2020-02-10 MED ORDER — VENLAFAXINE HCL ER 75 MG PO CP24: 75.0000 mg | ORAL_CAPSULE | Freq: Every day | ORAL | 2 refills | Status: DC

## 2020-02-10 NOTE — Progress Notes (Signed)
Virtual Visit via Telephone Note  I connected with Rebecca Andrade on 02/10/20 at  9:20 AM EDT by telephone and verified that I am speaking with the correct person using two identifiers.   I discussed the limitations, risks, security and privacy concerns of performing an evaluation and management service by telephone and the availability of in person appointments. I also discussed with the patient that there may be a patient responsible charge related to this service. The patient expressed understanding and agreed to proceed.   History of Present Illness: Patient was evaluated by phone session. She is compliant with medication and she feels the current medicine is working. She denies any irritability, anxiety, anger, mood swings or any crying spells. There are days when she feels down but she feels walking outside helps her mood. She already got first dose of vaccine and hoping to get second one soon. She is happy that her son is living with her but he may move with her girlfriend and thinking about buying a house. She is happy that son is doing well and continues to drive truck. Her attention, concentration and multitasking is good. She sleeps good. She has no tremors, shakes or any EPS. Due to repeated kidney stone her physician now started her on regular potassium and she believes that helps. She still have some anxiety when she goes outside in public. She does not want to change medication. Her appetite is okay. Her weight is stable.   Past Psychiatric History:Reviewed. H/Otaking antidepressants since 1990. H/Oinpatient twice,onein 2005 andin2014.H/Ococaine use,paranoia, hallucination, mood swing, anger and mania.No h/osuicidal attempt.TriedZoloft, Paxil, Lexapro, Wellbutrin, lithium, Remeron, Topamax, Abilify, Pristiq, Effexor, amitriptyline, Xanax, temazepam, Valium and Cymbalta. PCPprscribed Adderall for ADD.   Psychiatric Specialty Exam: Physical Exam  Review of Systems   There were no vitals taken for this visit.There is no height or weight on file to calculate BMI.  General Appearance: NA  Eye Contact:  NA  Speech:  Slow  Volume:  Decreased  Mood:  Euthymic  Affect:  NA  Thought Process:  Descriptions of Associations: Intact  Orientation:  Full (Time, Place, and Person)  Thought Content:  WDL  Suicidal Thoughts:  No  Homicidal Thoughts:  No  Memory:  Immediate;   Fair Recent;   Fair Remote;   Fair  Judgement:  Intact  Insight:  Good  Psychomotor Activity:  NA  Concentration:  Concentration: Fair and Attention Span: Fair  Recall:  Good  Fund of Knowledge:  Good  Language:  Good  Akathisia:  No  Handed:  Right  AIMS (if indicated):     Assets:  Communication Skills Desire for Improvement Housing Resilience Transportation  ADL's:  Intact  Cognition:  WNL  Sleep:   good      Assessment and Plan: Major depressive disorder, recurrent. Social anxiety disorder. ADD, inattentive type.  Patient is stable on her current medication. She does not want to change medication. Continue Seroquel 100 mg at bedtime, Adderall 20 mg twice a day, Effexor XR 75 mg daily, Prozac 40 mg daily and BuSpar 30 mg daily. Discussed medication side effects and benefits. Recommended to call us back if she has any question of any concern. Follow-up in 3 months.  Follow Up Instructions:    I discussed the assessment and treatment plan with the patient. The patient was provided an opportunity to ask questions and all were answered. The patient agreed with the plan and demonstrated an understanding of the instructions.   The patient was advised  to call back or seek an in-person evaluation if the symptoms worsen or if the condition fails to improve as anticipated.  I provided 20 minutes of non-face-to-face time during this encounter.   Kathlee Nations, MD

## 2020-02-16 ENCOUNTER — Other Ambulatory Visit: Payer: Self-pay

## 2020-02-16 ENCOUNTER — Ambulatory Visit (INDEPENDENT_AMBULATORY_CARE_PROVIDER_SITE_OTHER): Payer: Medicare HMO | Admitting: Family Medicine

## 2020-02-16 ENCOUNTER — Ambulatory Visit
Admission: RE | Admit: 2020-02-16 | Discharge: 2020-02-16 | Disposition: A | Discharge status: Home / Self Care | Payer: Medicare HMO | Source: Ambulatory Visit | Attending: Family Medicine | Admitting: Family Medicine

## 2020-02-16 ENCOUNTER — Encounter: Payer: Self-pay | Admitting: Family Medicine

## 2020-02-16 ENCOUNTER — Other Ambulatory Visit: Payer: Self-pay | Admitting: Internal Medicine

## 2020-02-16 VITALS — BP 110/70 | HR 81 | Temp 97.8°F | Wt 195.4 lb

## 2020-02-16 DIAGNOSIS — M545 Low back pain, unspecified: Secondary | ICD-10-CM

## 2020-02-16 LAB — POCT URINALYSIS DIP (PROADVANTAGE DEVICE)
Bilirubin, UA: NEGATIVE
Blood, UA: NEGATIVE
Glucose, UA: NEGATIVE mg/dL
Ketones, POC UA: NEGATIVE mg/dL
Leukocytes, UA: NEGATIVE
Nitrite, UA: NEGATIVE
Protein Ur, POC: NEGATIVE mg/dL
Specific Gravity, Urine: 1.025
Urobilinogen, Ur: NEGATIVE
pH, UA: 6 (ref 5.0–8.0)

## 2020-02-16 MED ORDER — KETOROLAC TROMETHAMINE 60 MG/2ML IM SOLN
60.0000 mg | Freq: Once | INTRAMUSCULAR | Status: AC
  Administered 2020-02-16: 60 mg via INTRAMUSCULAR

## 2020-02-16 MED ORDER — DICLOFENAC SODIUM 75 MG PO TBEC: 75.0000 mg | DELAYED_RELEASE_TABLET | Freq: Two times a day (BID) | ORAL | 0 refills | Status: DC

## 2020-02-16 NOTE — Progress Notes (Signed)
Please let her know that her XR shows some mild degenerative changes in her spine. It also shows possible mild kidney stones on both sides. Unclear as to what is causing her pain but if she gets much worse or if she starts vomiting or having issues with urinating, she should go to the emergency department. Let's see how she does over the next few days. We can also refer her to an orthopedist if she would like if her back pain is not improving.

## 2020-02-16 NOTE — Progress Notes (Signed)
Subjective:    Patient ID: Rebecca Andrade, female    DOB: Mar 18, 1971, 49 y.o.   MRN: [id number]  HPI Chief Complaint  Patient presents with  . back pain    back pain- lower back- constant pain, going on a week. doesn't think its kidney stone related as she doens't have issues urinary   Complains of a one week history of constant bilateral low back pain. No known injury. Denies history of back pain. States her back "feels stiff". Denies radiation. Cannot say what worsens pain. Sitting and laying down improves pain but only slightly.  No numbness, tingling or weakness. No loss of control of bowels or bladder. No saddle anesthesia.   History of kidney stones. States it does not feel like her usual kidney stone pain. Denies any urinary symptoms.   She takes baclofen for another issue and states it does not give her any relief.   States she has tried ibuprofen 800 mg every 6 hours and this did not help either.   No fever, chills, chest pain, shortness of breath, abdominal pain, N/V/D.   Reviewed allergies, medications, past medical, surgical, family, and social history.   Review of Systems Pertinent positives and negatives in the history of present illness.     Objective:   Physical Exam Eyes:     Conjunctiva/sclera: Conjunctivae normal.  Cardiovascular:     Rate and Rhythm: Normal rate and regular rhythm.     Pulses: Normal pulses.  Pulmonary:     Effort: Pulmonary effort is normal.     Breath sounds: Normal breath sounds.  Abdominal:     General: Abdomen is flat. There is no distension.     Palpations: Abdomen is soft.     Tenderness: There is no abdominal tenderness. There is no right CVA tenderness or left CVA tenderness.  Musculoskeletal:     Cervical back: Normal, normal range of motion and neck supple.     Thoracic back: Normal.     Lumbar back: No spasms, tenderness or bony tenderness. Decreased range of motion. Negative right straight leg raise test and negative  left straight leg raise test.  Skin:    General: Skin is warm and dry.     Capillary Refill: Capillary refill takes less than 2 seconds.  Neurological:     General: No focal deficit present.     Mental Status: She is alert and oriented to person, place, and time.     Cranial Nerves: No cranial nerve deficit.     Sensory: No sensory deficit.     Motor: No weakness.     Coordination: Coordination normal.     Gait: Gait normal.     Deep Tendon Reflexes: Reflexes normal.    BP 110/70   Pulse 81   Temp 97.8 F (36.6 C)   Wt 195 lb 6.4 oz (88.6 kg)   BMI 30.60 kg/m         Assessment & Plan:  Acute bilateral low back pain without sciatica - Plan: DG Lumbar Spine Complete, diclofenac (VOLTAREN) 75 MG EC tablet, POCT Urinalysis DIP (Proadvantage Device), ketorolac (TORADOL) injection 60 mg  Urinalysis dipstick negative.  No red flag symptoms. She is quite uncomfortable.  Toradol 60 mg IM given in office.  I will send her for a lumbar spine XR.  She will stop OTC pain medications.  Start diclofenac. May continue Baclofen.  Use heat or ice, a topical pain analgesic as well.  Offered PT referral and she declines  stating she cannot afford it.  Follow up pending XR result. She does not have an orthopedist.

## 2020-02-16 NOTE — Patient Instructions (Addendum)
We are giving you an injection of Toradol for pain.   STOP taking all over the counter pain medications such as ibuprofen.   Start taking diclofenac twice daily. Be aware that this has an increased risk of gastrointestinal bleeding so take this with food. If you have any stomach upset then take Pepcid or Prilosec or Nexium.   Continue using heat or ice.  You can also you Salon Pas or Biofreeze  Go to [facility] for your low back X ray.   Follow up if not improving or if worsening.

## 2020-02-17 ENCOUNTER — Ambulatory Visit: Payer: Medicare HMO | Admitting: Neurology

## 2020-02-17 ENCOUNTER — Emergency Department (HOSPITAL_COMMUNITY): Payer: Medicare HMO

## 2020-02-17 ENCOUNTER — Encounter (HOSPITAL_COMMUNITY): Payer: Self-pay | Admitting: Emergency Medicine

## 2020-02-17 ENCOUNTER — Encounter: Payer: Self-pay | Admitting: Neurology

## 2020-02-17 ENCOUNTER — Other Ambulatory Visit: Payer: Self-pay

## 2020-02-17 ENCOUNTER — Emergency Department (HOSPITAL_COMMUNITY)
Admission: EM | Admit: 2020-02-17 | Discharge: 2020-02-17 | Disposition: A | Discharge status: Home / Self Care | Payer: Medicare HMO | Attending: Emergency Medicine | Admitting: Emergency Medicine

## 2020-02-17 DIAGNOSIS — N200 Calculus of kidney: Secondary | ICD-10-CM

## 2020-02-17 DIAGNOSIS — Z87442 Personal history of urinary calculi: Secondary | ICD-10-CM | POA: Diagnosis not present

## 2020-02-17 DIAGNOSIS — R109 Unspecified abdominal pain: Secondary | ICD-10-CM | POA: Diagnosis present

## 2020-02-17 LAB — URINALYSIS, ROUTINE W REFLEX MICROSCOPIC
Bilirubin Urine: NEGATIVE
Glucose, UA: NEGATIVE mg/dL
Hgb urine dipstick: NEGATIVE
Ketones, ur: NEGATIVE mg/dL
Nitrite: NEGATIVE
Protein, ur: NEGATIVE mg/dL
Specific Gravity, Urine: 1.023 (ref 1.005–1.030)
pH: 5 (ref 5.0–8.0)

## 2020-02-17 LAB — CBC
HCT: 45.7 % (ref 36.0–46.0)
Hemoglobin: 14.4 g/dL (ref 12.0–15.0)
MCH: 29.8 pg (ref 26.0–34.0)
MCHC: 31.5 g/dL (ref 30.0–36.0)
MCV: 94.6 fL (ref 80.0–100.0)
Platelets: 311 10*3/uL (ref 150–400)
RBC: 4.83 MIL/uL (ref 3.87–5.11)
RDW: 12.5 % (ref 11.5–15.5)
WBC: 9.3 10*3/uL (ref 4.0–10.5)
nRBC: 0 % (ref 0.0–0.2)

## 2020-02-17 LAB — BASIC METABOLIC PANEL
Anion gap: 7 (ref 5–15)
BUN: 31 mg/dL — ABNORMAL HIGH (ref 6–20)
CO2: 23 mmol/L (ref 22–32)
Calcium: 8.9 mg/dL (ref 8.9–10.3)
Chloride: 112 mmol/L — ABNORMAL HIGH (ref 98–111)
Creatinine, Ser: 0.97 mg/dL (ref 0.44–1.00)
GFR calc Af Amer: >60 mL/min (ref >60)
GFR calc non Af Amer: >60 mL/min (ref >60)
Glucose, Bld: 95 mg/dL (ref 70–99)
Potassium: 4.1 mmol/L (ref 3.5–5.1)
Sodium: 142 mmol/L (ref 135–145)

## 2020-02-17 LAB — I-STAT BETA HCG BLOOD, ED (MC, WL, AP ONLY): I-stat hCG, quantitative: <5 m[IU]/mL (ref <5)

## 2020-02-17 MED ORDER — HYDROMORPHONE HCL 1 MG/ML IJ SOLN
1.0000 mg | Freq: Once | INTRAMUSCULAR | Status: AC
  Administered 2020-02-17: 17:00:00 1 mg via INTRAVENOUS
  Filled 2020-02-17: qty 1

## 2020-02-17 MED ORDER — OXYCODONE-ACETAMINOPHEN 5-325 MG PO TABS
1.0000 | ORAL_TABLET | ORAL | Status: DC | PRN
  Administered 2020-02-17: 13:00:00 1 via ORAL
  Filled 2020-02-17: qty 1

## 2020-02-17 MED ORDER — ONDANSETRON 8 MG PO TBDP
8.0000 mg | ORAL_TABLET | Freq: Once | ORAL | Status: AC
  Administered 2020-02-17: 8 mg via ORAL
  Filled 2020-02-17: qty 1

## 2020-02-17 MED ORDER — OXYCODONE-ACETAMINOPHEN 10-325 MG PO TABS: 1.0000 | ORAL_TABLET | ORAL | 0 refills | Status: DC | PRN

## 2020-02-17 MED ORDER — KETOROLAC TROMETHAMINE 30 MG/ML IJ SOLN
30.0000 mg | Freq: Once | INTRAMUSCULAR | Status: AC
  Administered 2020-02-17: 30 mg via INTRAVENOUS
  Filled 2020-02-17: qty 1

## 2020-02-17 MED ORDER — PROMETHAZINE HCL 25 MG/ML IJ SOLN
25.0000 mg | Freq: Once | INTRAMUSCULAR | Status: AC
  Administered 2020-02-17: 17:00:00 25 mg via INTRAVENOUS
  Filled 2020-02-17: qty 1

## 2020-02-17 NOTE — Discharge Instructions (Signed)
Return here as needed. You will need to follow up with your doctor.
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
## 2020-02-17 NOTE — ED Triage Notes (Signed)
Patient here from home with complaints of bilateral flank pain with nausea and vomiting. Hx of same. Pain 10/10.

## 2020-02-17 NOTE — ED Notes (Signed)
Pt transported to CT at this time.

## 2020-02-17 NOTE — ED Notes (Signed)
Pt provided with water at this time.

## 2020-02-17 NOTE — ED Notes (Signed)
Patient has a gold top in the main lab 

## 2020-02-18 ENCOUNTER — Telehealth: Payer: Self-pay

## 2020-02-18 ENCOUNTER — Ambulatory Visit (INDEPENDENT_AMBULATORY_CARE_PROVIDER_SITE_OTHER): Payer: Medicare HMO | Admitting: Surgery

## 2020-02-18 DIAGNOSIS — N200 Calculus of kidney: Secondary | ICD-10-CM

## 2020-02-18 LAB — URINE CULTURE
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
## 2020-02-18 NOTE — Telephone Encounter (Signed)
Patient was referred to our office today for "LBP". She was seen at the ED for kidney stones yesterday. Per Zonia Kief he is not going to charge for visit today. States she needs to go see [facility]. We did make appt for her to go see them at 12:45. She wanted her copay refunded back which we did. But wanted cash instead (paid with card) . We advised her we did not take cash at this time. States she couldn't attend Urology appt then if she didn't get this copay in cash. Front desk person advised her she can asked them to bill her. Patient states no they cant do that since she owes them money. Patient was very Upset.  Zonia Kief spoke to PCP [facility].
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
## 2020-02-25 DIAGNOSIS — N200 Calculus of kidney: Secondary | ICD-10-CM | POA: Diagnosis not present

## 2020-03-04 ENCOUNTER — Other Ambulatory Visit (HOSPITAL_COMMUNITY): Payer: Self-pay | Admitting: Psychiatry

## 2020-03-04 DIAGNOSIS — F4010 Social phobia, unspecified: Secondary | ICD-10-CM

## 2020-03-17 ENCOUNTER — Telehealth (HOSPITAL_COMMUNITY): Payer: Self-pay | Admitting: *Deleted

## 2020-03-17 DIAGNOSIS — F900 Attention-deficit hyperactivity disorder, predominantly inattentive type: Secondary | ICD-10-CM

## 2020-03-17 MED ORDER — AMPHETAMINE-DEXTROAMPHETAMINE 10 MG PO TABS: ORAL_TABLET | ORAL | 0 refills | Status: DC

## 2020-03-17 NOTE — Telephone Encounter (Signed)
I send it but not sure if insurance approved.

## 2020-03-17 NOTE — Telephone Encounter (Signed)
Pt called requesting an early refill on the Adderall 10mg . Medication is not due to fill until 5/15 but pt says she needs it filled on 5/14 as is going out of town. Next appointment on 05/11/20. Please review.

## 2020-04-16 ENCOUNTER — Telehealth (HOSPITAL_COMMUNITY): Payer: Self-pay | Admitting: *Deleted

## 2020-04-16 DIAGNOSIS — F900 Attention-deficit hyperactivity disorder, predominantly inattentive type: Secondary | ICD-10-CM

## 2020-04-16 NOTE — Telephone Encounter (Signed)
Pt called requesting a refill of the Adderall 10mg  last ordered 03/17/20. Pt has an upcoming appointment on 05/11/20. Please review.

## 2020-04-18 NOTE — Progress Notes (Deleted)
Rebecca Andrade is a 49 y.o. female who presents for annual wellness visit and follow-up on chronic medical conditions.  She has the following concerns:   Immunization History  Administered Date(s) Administered  . Influenza, Seasonal, Injecte, Preservative Fre 06/06/2016  . Influenza,inj,Quad PF,6+ Mos 09/14/2018   Last Pap smear: Last mammogram: Last colonoscopy: Last DEXA: Dentist: Ophtho: Exercise:  Other doctors caring for patient include:   Depression screen:  See questionnaire below.  Depression screen [facility] 2/9 06/05/2018 05/21/2017  Decreased Interest 0 1  Down, Depressed, Hopeless 0 1  PHQ - 2 Score 0 2  Some encounter information is confidential and restricted. Go to Review Flowsheets activity to see all data.  Some recent data might be hidden    Fall Risk Screen: see questionnaire below. Fall Risk  06/05/2018 05/21/2017  Falls in the past year? No No    ADL screen:  See questionnaire below Functional Status Survey:     End of Life Discussion:  Patient {ACTIONS; HAS/DOES NOT HAVE:19233} a living will and medical power of attorney  Review of Systems Constitutional: -fever, -chills, -sweats, -unexpected weight change, -anorexia, -fatigue Allergy: -sneezing, -itching, -congestion Dermatology: denies changing moles, rash, lumps, new worrisome lesions ENT: -runny nose, -ear pain, -sore throat, -hoarseness, -sinus pain, -teeth pain, -tinnitus, -hearing loss, -epistaxis Cardiology:  -chest pain, -palpitations, -edema, -orthopnea, -paroxysmal nocturnal dyspnea Respiratory: -cough, -shortness of breath, -dyspnea on exertion, -wheezing, -hemoptysis Gastroenterology: -abdominal pain, -nausea, -vomiting, -diarrhea, -constipation, -blood in stool, -changes in bowel movement, -dysphagia Hematology: -bleeding or bruising problems Musculoskeletal: -arthralgias, -myalgias, -joint swelling, -back pain, -neck pain, -cramping, -gait changes Ophthalmology: -vision changes, -eye  redness, -itching, -discharge Urology: -dysuria, -difficulty urinating, -hematuria, -urinary frequency, -urgency, incontinence Neurology: -headache, -weakness, -tingling, -numbness, -speech abnormality, -memory loss, -falls, -dizziness Psychology:  -depressed mood, -agitation, -sleep problems    PHYSICAL EXAM:  There were no vitals taken for this visit.  General Appearance: Alert, cooperative, no distress, appears stated age Head: Normocephalic, without obvious abnormality, atraumatic Eyes: PERRL, conjunctiva/corneas clear, EOM's intact, fundi benign Ears: Normal TM's and external ear canals Nose: Nares normal, mucosa normal, no drainage or sinus   tenderness Throat: Lips, mucosa, and tongue normal; teeth and gums normal Neck: Supple, no lymphadenopathy; thyroid: no enlargement/tenderness/nodules; no carotid bruit or JVD Back: Spine nontender, no curvature, ROM normal, no CVA tenderness Lungs: Clear to auscultation bilaterally without wheezes, rales or ronchi; respirations unlabored Chest Wall: No tenderness or deformity Heart: Regular rate and rhythm, S1 and S2 normal, no murmur, rub or gallop Breast Exam: No tenderness, masses, or nipple discharge or inversion. No axillary lymphadenopathy Abdomen: Soft, non-tender, nondistended, normoactive bowel sounds, no masses, no hepatosplenomegaly Genitalia: Normal external genitalia without lesions.  BUS and vagina normal; cervix without lesions, or cervical motion tenderness. No abnormal vaginal discharge.  Uterus and adnexa not enlarged, nontender, no masses.  Pap performed Rectal: Normal tone, no masses or tenderness; guaiac negative stool Extremities: No clubbing, cyanosis or edema Pulses: 2+ and symmetric all extremities Skin: Skin color, texture, turgor normal, no rashes or lesions Lymph nodes: Cervical, supraclavicular, and axillary nodes normal Neurologic: CNII-XII intact, normal strength, sensation and gait; reflexes 2+ and symmetric  throughout Psych: Normal mood, affect, hygiene and grooming.  ASSESSMENT/PLAN: Medicare annual wellness visit, subsequent  Routine general medical examination at a health care facility     Discussed monthly self breast exams and yearly mammograms; at least 30 minutes of aerobic activity at least 5 days/week and weight-bearing exercise 2x/week; proper sunscreen use reviewed; healthy  diet, including goals of calcium and vitamin D intake and alcohol recommendations (less than or equal to 1 drink/day) reviewed; regular seatbelt use; changing batteries in smoke detectors.  Immunization recommendations discussed.  Colonoscopy recommendations reviewed   Medicare Attestation I have personally reviewed: The patient's medical and social history Their use of alcohol, tobacco or illicit drugs Their current medications and supplements The patient's functional ability including ADLs,fall risks, home safety risks, cognitive, and hearing and visual impairment Diet and physical activities Evidence for depression or mood disorders  The patient's weight, height, and BMI have been recorded in the chart.  I have made referrals, counseling, and provided education to the patient based on review of the above and I have provided the patient with a written personalized care plan for preventive services.     Harland Dingwall, NP-C   04/18/2020

## 2020-04-19 ENCOUNTER — Ambulatory Visit: Payer: Medicare HMO | Admitting: Family Medicine

## 2020-04-19 MED ORDER — AMPHETAMINE-DEXTROAMPHETAMINE 10 MG PO TABS: ORAL_TABLET | ORAL | 0 refills | Status: DC

## 2020-04-19 NOTE — Telephone Encounter (Signed)
Done

## 2020-04-23 ENCOUNTER — Other Ambulatory Visit (HOSPITAL_COMMUNITY): Payer: Self-pay | Admitting: Psychiatry

## 2020-04-23 ENCOUNTER — Other Ambulatory Visit: Payer: Self-pay | Admitting: Family Medicine

## 2020-04-23 DIAGNOSIS — F332 Major depressive disorder, recurrent severe without psychotic features: Secondary | ICD-10-CM

## 2020-04-23 DIAGNOSIS — F4010 Social phobia, unspecified: Secondary | ICD-10-CM

## 2020-04-27 ENCOUNTER — Ambulatory Visit: Payer: Self-pay | Admitting: Family Medicine

## 2020-04-29 ENCOUNTER — Encounter: Payer: Self-pay | Admitting: Family Medicine

## 2020-05-01 ENCOUNTER — Other Ambulatory Visit (HOSPITAL_COMMUNITY): Payer: Self-pay | Admitting: Psychiatry

## 2020-05-01 DIAGNOSIS — F4010 Social phobia, unspecified: Secondary | ICD-10-CM

## 2020-05-11 ENCOUNTER — Other Ambulatory Visit: Payer: Self-pay

## 2020-05-11 ENCOUNTER — Encounter (HOSPITAL_COMMUNITY): Payer: Self-pay | Admitting: Psychiatry

## 2020-05-11 ENCOUNTER — Telehealth (INDEPENDENT_AMBULATORY_CARE_PROVIDER_SITE_OTHER): Payer: Medicare HMO | Admitting: Psychiatry

## 2020-05-11 DIAGNOSIS — F4010 Social phobia, unspecified: Secondary | ICD-10-CM

## 2020-05-11 DIAGNOSIS — F900 Attention-deficit hyperactivity disorder, predominantly inattentive type: Secondary | ICD-10-CM | POA: Diagnosis not present

## 2020-05-11 DIAGNOSIS — F332 Major depressive disorder, recurrent severe without psychotic features: Secondary | ICD-10-CM

## 2020-05-11 MED ORDER — AMPHETAMINE-DEXTROAMPHETAMINE 10 MG PO TABS: ORAL_TABLET | ORAL | 0 refills | Status: DC

## 2020-05-11 MED ORDER — VENLAFAXINE HCL ER 75 MG PO CP24: 75.0000 mg | ORAL_CAPSULE | Freq: Every day | ORAL | 2 refills | Status: DC

## 2020-05-11 MED ORDER — BUSPIRONE HCL 30 MG PO TABS: 30.0000 mg | ORAL_TABLET | Freq: Two times a day (BID) | ORAL | 2 refills | Status: DC

## 2020-05-11 MED ORDER — FLUOXETINE HCL 40 MG PO CAPS: 40.0000 mg | ORAL_CAPSULE | Freq: Every day | ORAL | 2 refills | Status: DC

## 2020-05-11 MED ORDER — QUETIAPINE FUMARATE 100 MG PO TABS: ORAL_TABLET | ORAL | 2 refills | Status: DC

## 2020-05-11 NOTE — Progress Notes (Signed)
Virtual Visit via Telephone Note  I connected with Rebecca Andrade on 05/11/20 at  9:20 AM EDT by telephone and verified that I am speaking with the correct person using two identifiers.  Location: Patient: home Provider: Home Office   I discussed the limitations, risks, security and privacy concerns of performing an evaluation and management service by telephone and the availability of in person appointments. I also discussed with the patient that there may be a patient responsible charge related to this service. The patient expressed understanding and agreed to proceed.   History of Present Illness: Patient is a weighted by phone session.  She is taking her medication as prescribed.  She had a good July 4 and had a full problem.  She denies any irritability, anger but is still have a lot of anxiety and feeling paranoia when she go to public places.  She had received COVID vaccine but is still not comfortable around people.  His son may move with her girlfriend soon and in the billing she was nervous but now she is taking the news much better.  She has recently kidney stone and had lower back pain.  She was also found degenerative joint disease.  She is not taking any narcotic pain medication.  Her attention concentration is okay with the medication.  She is able to do multitasking.  She denies any irritability, anger, mania, psychosis or any crying spells.  Her energy level is fair.  Her appetite is okay.  She is sleeping better with the medication.  She does not want to change the medication.  She has no tremor shakes.   Past Psychiatric History:Reviewed. H/Otaking antidepressants since 1990. H/Oinpatient twice,onein 2005 andin2014.H/Ococaine use,paranoia, hallucination, mood swing, anger and mania.No h/osuicidal attempt.TriedZoloft, Paxil, Lexapro, Wellbutrin, lithium, Remeron, Topamax, Abilify, Pristiq, Effexor, amitriptyline, Xanax, temazepam, Valium and Cymbalta. PCPprscribed  Adderall for ADD.   Psychiatric Specialty Exam: Physical Exam  Review of Systems  Weight 190 lb (86.2 kg).There is no height or weight on file to calculate BMI.  General Appearance: NA  Eye Contact:  NA  Speech:  Slow  Volume:  Normal  Mood:  Anxious  Affect:  NA  Thought Process:  Descriptions of Associations: Intact  Orientation:  Full (Time, Place, and Person)  Thought Content:  WDL  Suicidal Thoughts:  No  Homicidal Thoughts:  No  Memory:  Immediate;   Fair Recent;   Fair Remote;   Fair  Judgement:  Intact  Insight:  Present  Psychomotor Activity:  NA  Concentration:  Concentration: Fair and Attention Span: Fair  Recall:  [employer] of Knowledge:  Fair  Language:  Good  Akathisia:  No  Handed:  Right  AIMS (if indicated):     Assets:  Communication Skills Desire for Improvement Housing Resilience Social Support  ADL's:  Intact  Cognition:  WNL  Sleep:   good     Assessment and Plan: Major depressive disorder, recurrent.  Social anxiety disorder.  ADD, inattentive type.  Patient is a stable on her medicine.  She has no tremors shakes or any EPS.  We will continue Adderall 20 mg twice a day, Effexor XR 75 mg daily, Prozac 40 mg daily, BuSpar 30 mg daily and Seroquel 100 mg at bedtime.  Recommended to call us back if she has any question or any concern.  Follow-up in 3 months.  Follow Up Instructions:    I discussed the assessment and treatment plan with the patient. The patient was provided an  opportunity to ask questions and all were answered. The patient agreed with the plan and demonstrated an understanding of the instructions.   The patient was advised to call back or seek an in-person evaluation if the symptoms worsen or if the condition fails to improve as anticipated.  I provided 15 minutes of non-face-to-face time during this encounter.   Kathlee Nations, MD

## 2020-05-21 NOTE — Progress Notes (Signed)
Chief Complaint  Patient presents with  . Vaginal odor    no discharge, no urinary symptoms. Just doesn't feel fresh. Has had odor x 1 week.     Patient presents with complaint of vaginal odor. She has noticed this for a week. The odor is fishy.  She has noticed that the odor is more noticeable after intercourse. She isn't aware of much of a discharge, just the odor. Denies possibility of any foreign body. Married, monogamous relationship, no condom use.  She last had STD testing in 01/2020--negative HIV, RPR, GC, chlamydia and trichomonas all negative "I just like to get checked", denies infidelity concerns.  PMH, PSH, SH reviewed. Outpatient Encounter Medications as of 05/24/2020  Medication Sig  . amphetamine-dextroamphetamine (ADDERALL) 10 MG tablet Take 2 in am and 2 at noon  . baclofen (LIORESAL) 10 MG tablet TAKE 1 TABLET BY MOUTH TWICE A DAY  . busPIRone (BUSPAR) 30 MG tablet Take 1 tablet (30 mg total) by mouth 2 (two) times daily.  [name] FLUoxetine (PROZAC) 40 MG capsule Take 1 capsule (40 mg total) by mouth daily.  [name] ketorolac (TORADOL) 10 MG tablet   . LINZESS 290 MCG CAPS capsule TAKE 1 CAPSULE BY MOUTH ONCE DAILY BEFORE BREAKFAST  . methocarbamol (ROBAXIN) 500 MG tablet Take 500 mg by mouth as needed.   [name] oxybutynin (DITROPAN) 5 MG tablet Take 5 mg by mouth daily.   . Potassium Citrate 15 MEQ (1620 MG) TBCR Take 1 tablet by mouth 2 (two) times daily.  . QUEtiapine (SEROQUEL) 100 MG tablet TAKE 1 TABLET BY MOUTH EVERYDAY AT BEDTIME  . venlafaxine XR (EFFEXOR-XR) 75 MG 24 hr capsule Take 1 capsule (75 mg total) by mouth daily with breakfast.  . meclizine (ANTIVERT) 25 MG tablet TAKE 1 TABLET BY MOUTH THREE TIMES DAILY AS NEEDED FOR  DIZZINESS (Patient not taking: Reported on 05/24/2020)  . promethazine (PHENERGAN) 50 MG tablet Take 1 tablet (50 mg total) by mouth every 8 (eight) hours as needed for nausea or vomiting. (Patient not taking: Reported on 05/24/2020)  . [DISCONTINUED]  Baclofen 5 MG TABS   . [DISCONTINUED] diclofenac (VOLTAREN) 75 MG EC tablet Take 1 tablet (75 mg total) by mouth 2 (two) times daily. (Patient not taking: Reported on 05/11/2020)  . [DISCONTINUED] oxyCODONE-acetaminophen (PERCOCET) 10-325 MG tablet Take 1 tablet by mouth every 4 (four) hours as needed for pain. (Patient not taking: Reported on 05/11/2020)  . [DISCONTINUED] tamsulosin (FLOMAX) 0.4 MG CAPS capsule Take 1 capsule (0.4 mg total) by mouth daily after supper.   No facility-administered encounter medications on file as of 05/24/2020.   No Known Allergies  ROS: no fever, chills, URI symptoms, cough, shortness of breath, chest pain, nausea, vomiting.  She has chronic constipation, relieved by Linzess.sometimes her bottom gets raw, slightly raw now. Denies dysuria, frequency or other concerns.  Denies any significant vaginal discharge, just the odor.  PHYSICAL EXAM:  BP 118/70   Pulse 84   Temp 98 F (36.7 C) (Tympanic)   Ht 5\' 7"  (1.702 m)   Wt 192 lb 9.6 oz (87.4 kg)   BMI 30.17 kg/m   Well-appearing, pleasant female in no distress HEENT: conjunctiva and sclera are clear, EOMI. Wearing mask Neck: no lymphadenopathy Heart: regular rate and rhythm Lungs: clear bilaterally Back: no CVA tenderness Abdomen: soft, nontender, no mass Extremities: no edema GU: normal external genitalia, no rashes, lesions Small amount of white vaginal discharge, no odor noted. No cervical motion tenderness, no adnexal or uterine  tenderness or masses  KOH negative Wet prep:  Squams,  Only few bacterial no noted clue cells.  ASSESSMENT/PLAN:  Vaginal odor - normal appearing wet prep/KOH;  suspect BV by history, possibly very early. Treat presumptively, pt prefers cream - Plan: metroNIDAZOLE (METROGEL VAGINAL) 0.75 % vaginal gel, POCT Wet Prep [facility])  Vaginal discharge - Plan: POCT Wet Prep (Wet [place])   30 min FTF with patient, plus time in chart review, documentation, looking at specimen  under microscope.   Your symptoms sound consistent with bacterial vaginosis. Your exam was normal, only a small amount of white discharge was noted. I didn't note any particular odor, and under the microscope didn't see the usual findings that we would see with BV. Given your classic symptoms, we elected to treat presumptively for BV. If your symptoms don't improve, then we will have to think of other causes. Sometimes odors can be related to the urine, vitamins, different foods (ie asparagus causing a terrible odor to the urine).  Use the cream once at night for 5 nights.

## 2020-05-24 ENCOUNTER — Other Ambulatory Visit: Payer: Self-pay

## 2020-05-24 ENCOUNTER — Ambulatory Visit (INDEPENDENT_AMBULATORY_CARE_PROVIDER_SITE_OTHER): Payer: Medicare HMO | Admitting: Family Medicine

## 2020-05-24 ENCOUNTER — Encounter: Payer: Self-pay | Admitting: Family Medicine

## 2020-05-24 VITALS — BP 118/70 | HR 84 | Temp 98.0°F | Ht 67.0 in | Wt 192.6 lb

## 2020-05-24 DIAGNOSIS — N898 Other specified noninflammatory disorders of vagina: Secondary | ICD-10-CM

## 2020-05-24 LAB — POCT WET PREP (WET MOUNT)
Clue Cells Wet Prep Whiff POC: NEGATIVE
Trichomonas Wet Prep HPF POC: ABSENT

## 2020-05-24 MED ORDER — METRONIDAZOLE 0.75 % VA GEL: 1.0000 | Freq: Every day | VAGINAL | 0 refills | Status: DC

## 2020-05-24 NOTE — Patient Instructions (Signed)
Your symptoms sound consistent with bacterial vaginosis. Your exam was normal, only a small amount of white discharge was noted. I didn't note any particular odor, and under the microscope didn't see the usual findings that we would see with BV. Given your classic symptoms, we elected to treat presumptively for BV. If your symptoms don't improve, then we will have to think of other causes. Sometimes odors can be related to the urine, vitamins, different foods (ie asparagus causing a terrible odor to the urine).  Use the cream once at night for 5 nights.   Bacterial Vaginosis  Bacterial vaginosis is a vaginal infection that occurs when the normal balance of bacteria in the vagina is disrupted. It results from an overgrowth of certain bacteria. This is the most common vaginal infection among women ages 64-44. Because bacterial vaginosis increases your risk for STIs (sexually transmitted infections), getting treated can help reduce your risk for chlamydia, gonorrhea, herpes, and HIV (human immunodeficiency virus). Treatment is also important for preventing complications in pregnant women, because this condition can cause an early (premature) delivery. What are the causes? This condition is caused by an increase in harmful bacteria that are normally present in small amounts in the vagina. However, the reason that the condition develops is not fully understood. What increases the risk? The following factors may make you more likely to develop this condition:  Having a new sexual partner or multiple sexual partners.  Having unprotected sex.  Douching.  Having an intrauterine device (IUD).  Smoking.  Drug and alcohol abuse.  Taking certain antibiotic medicines.  Being pregnant. You cannot get bacterial vaginosis from toilet seats, bedding, swimming pools, or contact with objects around you. What are the signs or symptoms? Symptoms of this condition include:  Grey or white vaginal  discharge. The discharge can also be watery or foamy.  A fish-like odor with discharge, especially after sexual intercourse or during menstruation.  Itching in and around the vagina.  Burning or pain with urination. Some women with bacterial vaginosis have no signs or symptoms. How is this diagnosed? This condition is diagnosed based on:  Your medical history.  A physical exam of the vagina.  Testing a sample of vaginal fluid under a microscope to look for a large amount of bad bacteria or abnormal cells. Your health care provider may use a cotton swab or a small wooden spatula to collect the sample. How is this treated? This condition is treated with antibiotics. These may be given as a pill, a vaginal cream, or a medicine that is put into the vagina (suppository). If the condition comes back after treatment, a second round of antibiotics may be needed. Follow these instructions at home: Medicines  Take over-the-counter and prescription medicines only as told by your health care provider.  Take or use your antibiotic as told by your health care provider. Do not stop taking or using the antibiotic even if you start to feel better. General instructions  If you have a female sexual partner, tell her that you have a vaginal infection. She should see her health care provider and be treated if she has symptoms. If you have a female sexual partner, he does not need treatment.  During treatment: ? Avoid sexual activity until you finish treatment. ? Do not douche. ? Avoid alcohol as directed by your health care provider. ? Avoid breastfeeding as directed by your health care provider.  Drink enough water and fluids to keep your urine clear or pale yellow.  Keep the area around your vagina and rectum clean. ? Wash the area daily with warm water. ? Wipe yourself from front to back after using the toilet.  Keep all follow-up visits as told by your health care provider. This is  important. How is this prevented?  Do not douche.  Wash the outside of your vagina with warm water only.  Use protection when having sex. This includes latex condoms and dental dams.  Limit how many sexual partners you have. To help prevent bacterial vaginosis, it is best to have sex with just one partner (monogamous).  Make sure you and your sexual partner are tested for STIs.  Wear cotton or cotton-lined underwear.  Avoid wearing tight pants and pantyhose, especially during summer.  Limit the amount of alcohol that you drink.  Do not use any products that contain nicotine or tobacco, such as cigarettes and e-cigarettes. If you need help quitting, ask your health care provider.  Do not use illegal drugs. Where to find more information  Centers for Disease Control and Prevention: SolutionApps.co.za  American Sexual Health Association (ASHA): www.ashastd.org  U.S. Department of Health and Health and safety inspector, Office on Women's Health: ConventionalMedicines.si or http://www.anderson-williamson.info/ Contact a health care provider if:  Your symptoms do not improve, even after treatment.  You have more discharge or pain when urinating.  You have a fever.  You have pain in your abdomen.  You have pain during sex.  You have vaginal bleeding between periods. Summary  Bacterial vaginosis is a vaginal infection that occurs when the normal balance of bacteria in the vagina is disrupted.  Because bacterial vaginosis increases your risk for STIs (sexually transmitted infections), getting treated can help reduce your risk for chlamydia, gonorrhea, herpes, and HIV (human immunodeficiency virus). Treatment is also important for preventing complications in pregnant women, because the condition can cause an early (premature) delivery.  This condition is treated with antibiotic medicines. These may be given as a pill, a vaginal cream, or a medicine that is put into the  vagina (suppository). This information is not intended to replace advice given to you by your health care provider. Make sure you discuss any questions you have with your health care provider. Document Revised: 10/05/2017 Document Reviewed: 07/08/2016 Elsevier Patient Education  2020 [employer].

## 2020-06-09 ENCOUNTER — Other Ambulatory Visit (HOSPITAL_COMMUNITY): Payer: Self-pay | Admitting: Psychiatry

## 2020-06-09 DIAGNOSIS — F4010 Social phobia, unspecified: Secondary | ICD-10-CM

## 2020-06-09 DIAGNOSIS — F332 Major depressive disorder, recurrent severe without psychotic features: Secondary | ICD-10-CM

## 2020-06-17 ENCOUNTER — Telehealth (HOSPITAL_COMMUNITY): Payer: Self-pay | Admitting: *Deleted

## 2020-06-17 DIAGNOSIS — F900 Attention-deficit hyperactivity disorder, predominantly inattentive type: Secondary | ICD-10-CM

## 2020-06-17 MED ORDER — AMPHETAMINE-DEXTROAMPHETAMINE 10 MG PO TABS: ORAL_TABLET | ORAL | 0 refills | Status: DC

## 2020-06-17 NOTE — Telephone Encounter (Signed)
Patient called to request refill of Adderall. Patient has an appt 08/11/20. Please review and advise.

## 2020-06-17 NOTE — Telephone Encounter (Signed)
Done

## 2020-06-19 ENCOUNTER — Other Ambulatory Visit: Payer: Self-pay | Admitting: Neurology

## 2020-07-15 ENCOUNTER — Telehealth (HOSPITAL_COMMUNITY): Payer: Self-pay | Admitting: *Deleted

## 2020-07-15 NOTE — Telephone Encounter (Signed)
I checked. She is not due until 07/20/20.

## 2020-07-15 NOTE — Telephone Encounter (Signed)
Patient called and requested refill of Adderall. Her next appointment is 10/6. It was last filled 8/12.

## 2020-07-15 NOTE — Telephone Encounter (Signed)
Patient called to request refill of Adderall.  Her next appt is 08/11/20. Please review and advise.

## 2020-07-16 ENCOUNTER — Other Ambulatory Visit: Payer: Self-pay | Admitting: Family Medicine

## 2020-07-20 ENCOUNTER — Telehealth (HOSPITAL_COMMUNITY): Payer: Self-pay | Admitting: *Deleted

## 2020-07-20 DIAGNOSIS — F900 Attention-deficit hyperactivity disorder, predominantly inattentive type: Secondary | ICD-10-CM

## 2020-07-20 MED ORDER — AMPHETAMINE-DEXTROAMPHETAMINE 10 MG PO TABS: ORAL_TABLET | ORAL | 0 refills | Status: DC

## 2020-07-20 NOTE — Telephone Encounter (Signed)
Patient has called again asking for her Adderall script today. She plans to pick it up today. Please review.[name]

## 2020-07-20 NOTE — Telephone Encounter (Signed)
Opened in error

## 2020-07-20 NOTE — Telephone Encounter (Signed)
Done

## 2020-08-05 ENCOUNTER — Other Ambulatory Visit (HOSPITAL_COMMUNITY): Payer: Self-pay | Admitting: Psychiatry

## 2020-08-05 DIAGNOSIS — F332 Major depressive disorder, recurrent severe without psychotic features: Secondary | ICD-10-CM

## 2020-08-05 DIAGNOSIS — F4010 Social phobia, unspecified: Secondary | ICD-10-CM

## 2020-08-08 ENCOUNTER — Other Ambulatory Visit: Payer: Self-pay | Admitting: Urology

## 2020-08-11 ENCOUNTER — Telehealth (INDEPENDENT_AMBULATORY_CARE_PROVIDER_SITE_OTHER): Payer: Medicare HMO | Admitting: Psychiatry

## 2020-08-11 ENCOUNTER — Encounter (HOSPITAL_COMMUNITY): Payer: Self-pay | Admitting: Psychiatry

## 2020-08-11 ENCOUNTER — Other Ambulatory Visit: Payer: Self-pay

## 2020-08-11 DIAGNOSIS — F332 Major depressive disorder, recurrent severe without psychotic features: Secondary | ICD-10-CM

## 2020-08-11 DIAGNOSIS — F900 Attention-deficit hyperactivity disorder, predominantly inattentive type: Secondary | ICD-10-CM

## 2020-08-11 DIAGNOSIS — F4010 Social phobia, unspecified: Secondary | ICD-10-CM

## 2020-08-11 MED ORDER — AMPHETAMINE-DEXTROAMPHETAMINE 10 MG PO TABS: ORAL_TABLET | ORAL | 0 refills | Status: DC

## 2020-08-11 MED ORDER — VENLAFAXINE HCL ER 75 MG PO CP24: 75.0000 mg | ORAL_CAPSULE | Freq: Every day | ORAL | 2 refills | Status: DC

## 2020-08-11 MED ORDER — FLUOXETINE HCL 40 MG PO CAPS: 40.0000 mg | ORAL_CAPSULE | Freq: Every day | ORAL | 2 refills | Status: DC

## 2020-08-11 MED ORDER — QUETIAPINE FUMARATE 100 MG PO TABS: ORAL_TABLET | ORAL | 2 refills | Status: DC

## 2020-08-11 MED ORDER — BUSPIRONE HCL 30 MG PO TABS: 30.0000 mg | ORAL_TABLET | Freq: Two times a day (BID) | ORAL | 2 refills | Status: DC

## 2020-08-11 NOTE — Progress Notes (Signed)
Virtual Visit via Telephone Note  I connected with Rebecca Andrade on 08/11/20 at  9:00 AM EDT by telephone and verified that I am speaking with the correct person using two identifiers.  Location: Patient: home Provider: home office   I discussed the limitations, risks, security and privacy concerns of performing an evaluation and management service by telephone and the availability of in person appointments. I also discussed with the patient that there may be a patient responsible charge related to this service. The patient expressed understanding and agreed to proceed.   History of Present Illness: Patient is evaluated by phone session.  She is sad because her 21 year old brother diagnosed with brain tumor and not doing well.  She feels sad about him because he is now walking with the help of walker.  Patient's brother daughter is pregnant and she feels very sad about the situation.  Her son also moved out but living close by.  Patient recently received a letter from the landlord that she may need to move as landlord selling the property.  Patient told all those things making her very nervous, anxious.  Her anxiety is increased.  She does not believe the house unless it is important.  She is sleeping good.  She denies any mania, psychosis, hallucination.  Her attention and concentration is okay.  She feels the Adderall helps and she is able to function at home.  She is taking BuSpar, Prozac, Seroquel, Effexor and denies any tremors shakes or any EPS.  She denies drinking or using any illegal substances.  Her appetite is okay.   Psychiatric Specialty Exam: Physical Exam  Review of Systems  Weight 187 lb (84.8 kg).There is no height or weight on file to calculate BMI.  General Appearance: NA  Eye Contact:  NA  Speech:  Slow  Volume:  Decreased  Mood:  Anxious and Dysphoric  Affect:  NA  Thought Process:  Goal Directed  Orientation:  Full (Time, Place, and Person)  Thought Content:   Rumination  Suicidal Thoughts:  No  Homicidal Thoughts:  No  Memory:  Immediate;   Good Recent;   Fair Remote;   Fair  Judgement:  Intact  Insight:  Present  Psychomotor Activity:  NA  Concentration:  Concentration: Fair and Attention Span: Fair  Recall:  [employer] of Knowledge:  Good  Language:  Good  Akathisia:  No  Handed:  Right  AIMS (if indicated):     Assets:  Communication Skills Housing Resilience  ADL's:  Intact  Cognition:  WNL  Sleep:   ok    Assessment and Plan: Major depressive disorder, recurrent.  Social anxiety disorder.  ADD, inattentive type.  Discussed situation at home and also discussed brother diagnosed with brain tumor.  I do believe patient need counseling and therapy for better coping skills.  In the past she has been reluctant but now she agree to give a try to see if therapy.  We will schedule appointment in our office for counseling.  Discussed current medication at this time patient does not want to change the medication.  We will continue Adderall 20 mg twice a day, Effexor XR 75 mg daily, Prozac 40 mg daily, BuSpar 30 mg twice a day and Seroquel 100 mg at bedtime.  Discussed medication side effects and benefits.  Recommended to call us back if she has any question or any concern.  Follow-up in 3 months.  Follow Up Instructions:    I discussed the assessment and  treatment plan with the patient. The patient was provided an opportunity to ask questions and all were answered. The patient agreed with the plan and demonstrated an understanding of the instructions.   The patient was advised to call back or seek an in-person evaluation if the symptoms worsen or if the condition fails to improve as anticipated.  I provided 20 minutes of non-face-to-face time during this encounter.   Cleotis Nipper, MD

## 2020-08-18 ENCOUNTER — Telehealth (HOSPITAL_COMMUNITY): Payer: Self-pay

## 2020-08-18 DIAGNOSIS — F900 Attention-deficit hyperactivity disorder, predominantly inattentive type: Secondary | ICD-10-CM

## 2020-08-18 MED ORDER — AMPHETAMINE-DEXTROAMPHETAMINE 10 MG PO TABS: ORAL_TABLET | ORAL | 0 refills | Status: DC

## 2020-08-18 NOTE — Telephone Encounter (Signed)
Thank you. All set to go. Pharmacy received script and pt notified

## 2020-08-18 NOTE — Telephone Encounter (Signed)
I send it again. It is due tomorrow

## 2020-08-18 NOTE — Telephone Encounter (Signed)
Spoke with [facility] [address] in [place] and pharmacist stated that they did not receive patient's script for her Adderall 10mg  tablet. Please re-send so patient can pick up all her medication tomorrow. Thank you

## 2020-09-07 IMAGING — CT CT ABDOMEN AND PELVIS WITH CONTRAST
2 of 5 series · 16 of 46 positions shown, 18 images · IV contrast (OMNIPAQUE)
Comparison: 08/17/2018 and earlier.

CLINICAL DATA: 47-year-old with acute onset of RIGHT flank pain and
diminished urine output. Personal history of urinary tract calculi.

EXAM:
CT ABDOMEN AND PELVIS WITH CONTRAST
TECHNIQUE: Multidetector CT imaging of the abdomen and pelvis was performed
using the standard protocol following bolus administration of
intravenous contrast.
CONTRAST:  100mL OMNIPAQUE IOHEXOL 300 MG/ML IV.

[Series 2: axial st · axial · 0.74mm/px · z∈[-481,-51]mm · 13 of 100 slices shown, 15 images]
[im 7/100  soft-tissue]
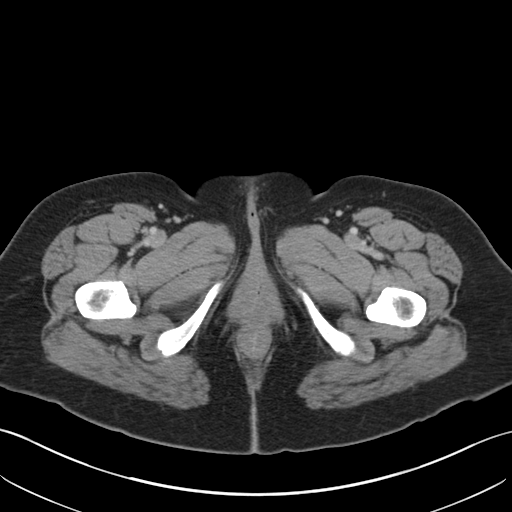
[im 7/100  bone]
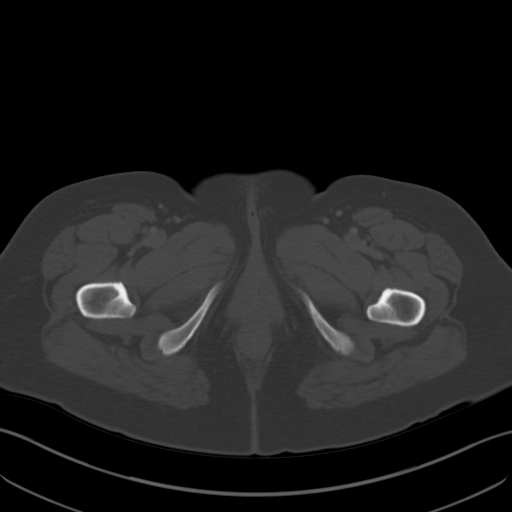
[im 14/100  soft-tissue]
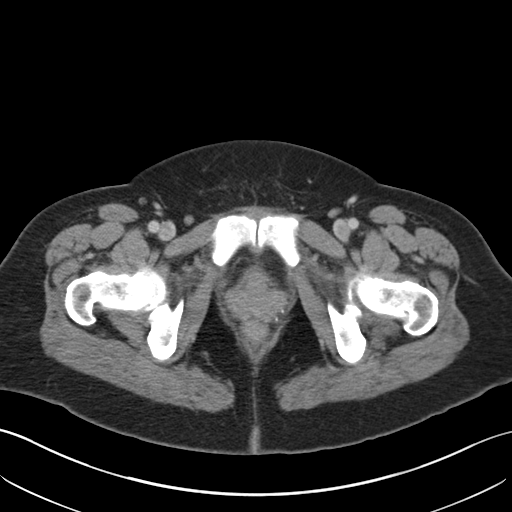
[im 20/100  soft-tissue]
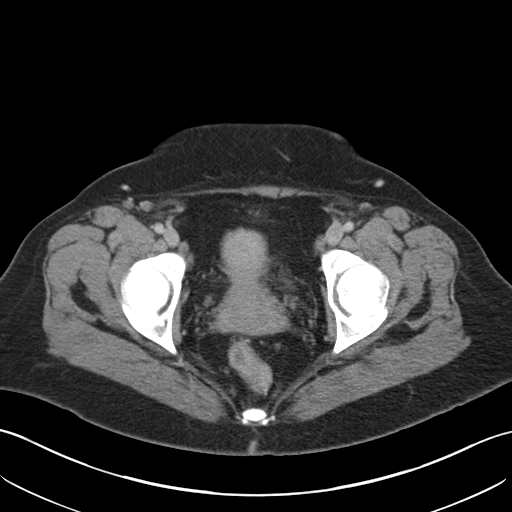
[im 27/100  soft-tissue]
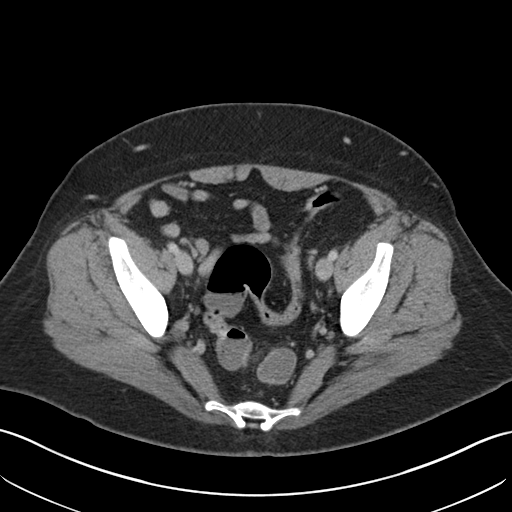
[im 34/100  soft-tissue]
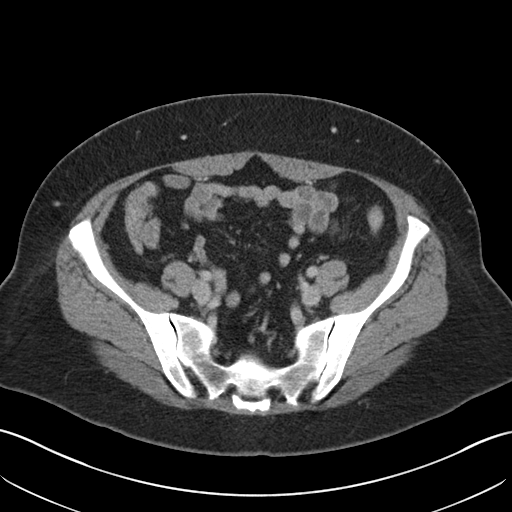
[im 40/100  soft-tissue]
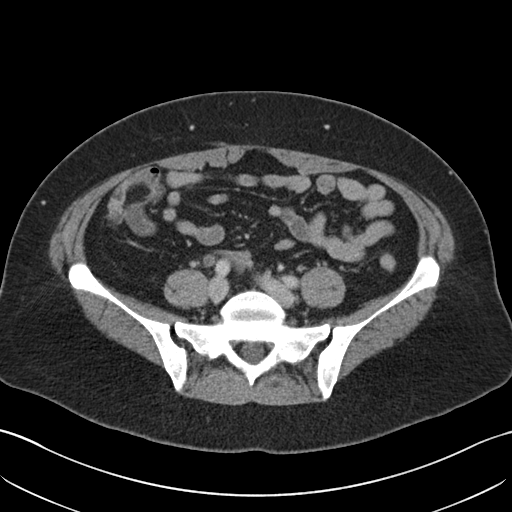
[im 53/100  soft-tissue]
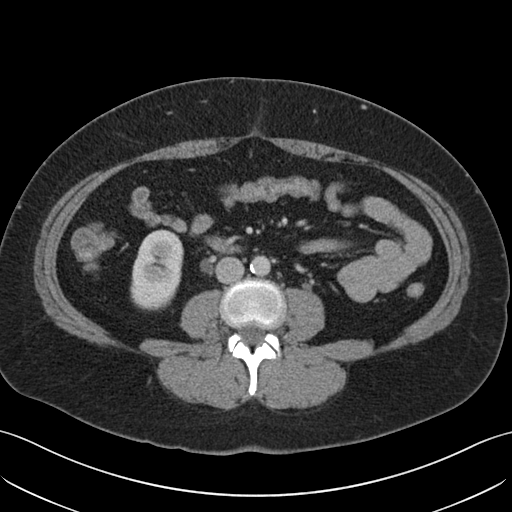
[im 60/100  soft-tissue]
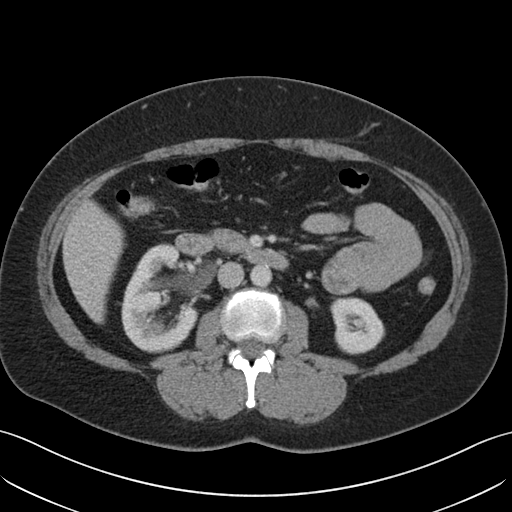
[im 67/100  soft-tissue]
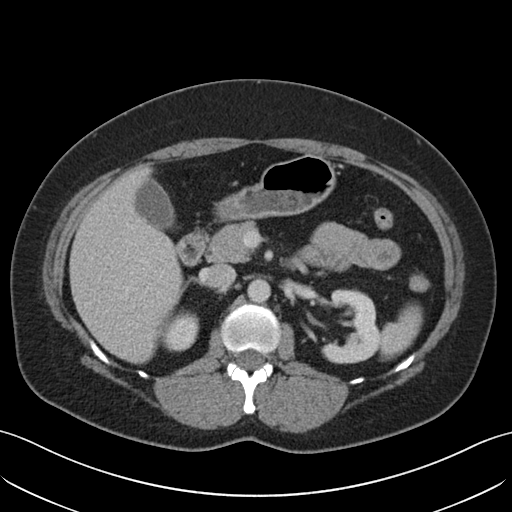
[im 67/100  bone]
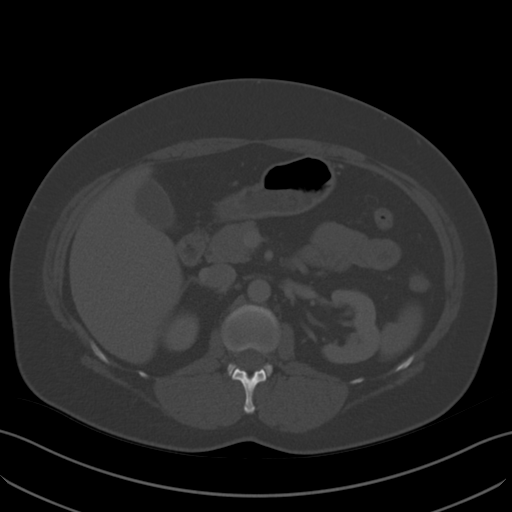
[im 73/100  soft-tissue]
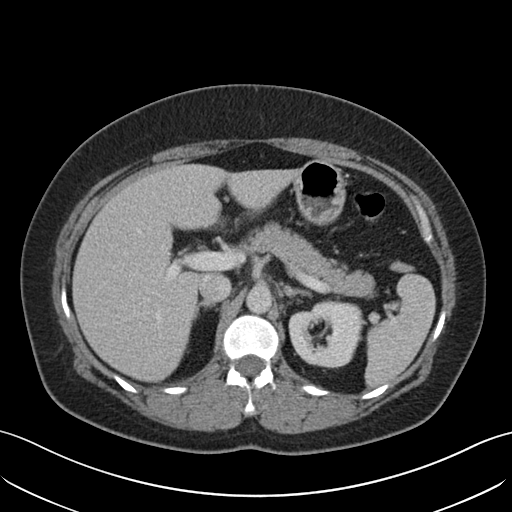
[im 80/100  soft-tissue]
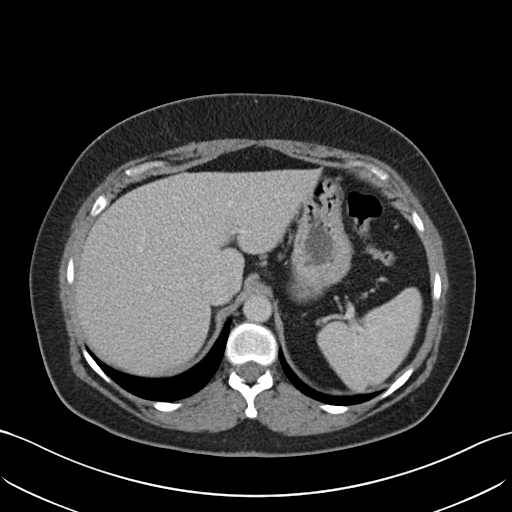
[im 86/100  soft-tissue]
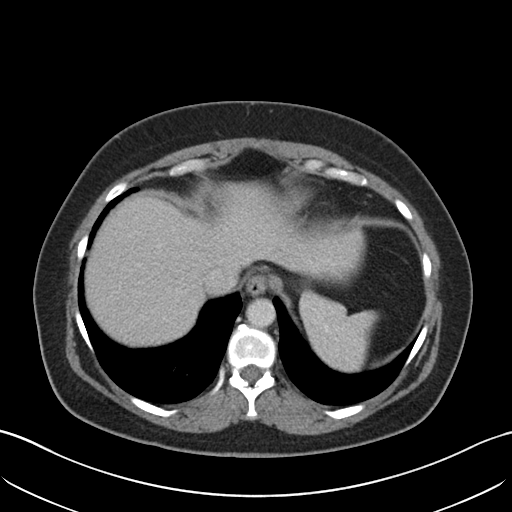
[im 93/100  soft-tissue]
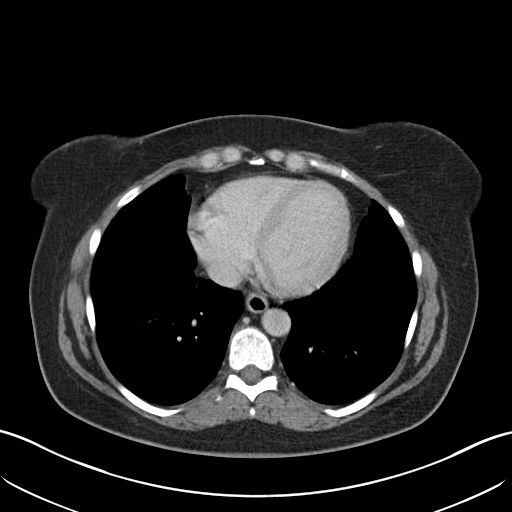

[Series 5: coronal st · coronal · 0.95mm/px · 3 of 157 slices shown]
[im 53/157  soft-tissue]
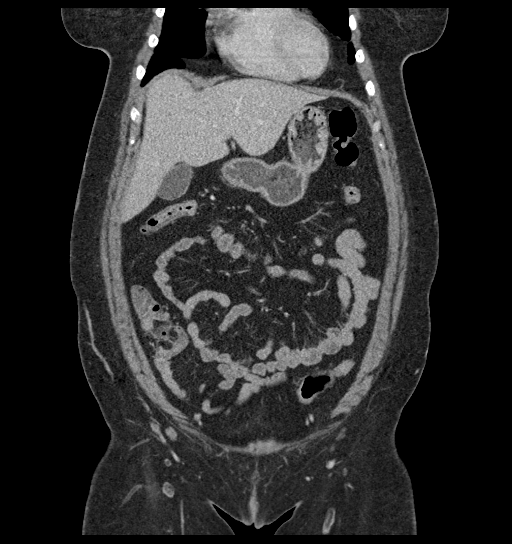
[im 70/157  soft-tissue]
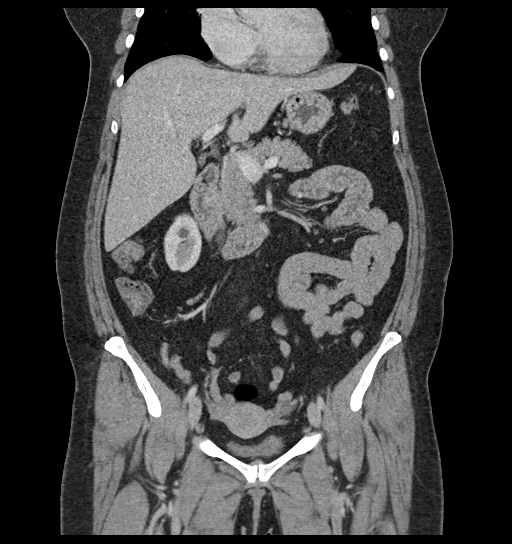
[im 87/157  soft-tissue]
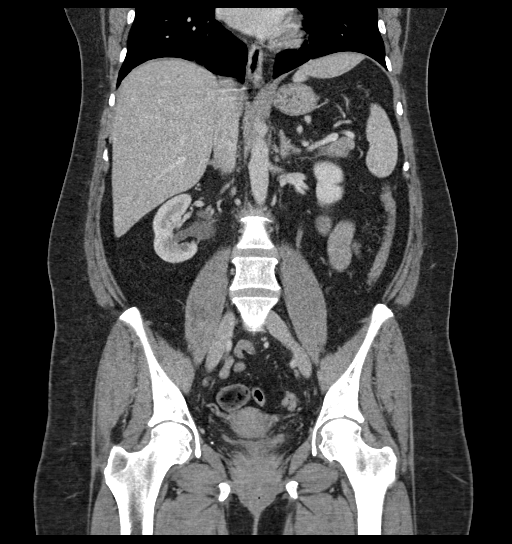

[16 of 46 positions shown; findings below may reference images not displayed]

FINDINGS: Lower chest: Mild scarring and bronchiectasis in the RIGHT MIDDLE
LOBE. Visualized lung bases otherwise clear. Normal heart size.

Hepatobiliary: Liver normal in size and appearance. Gallbladder
normal in appearance without calcified gallstones. No biliary ductal
dilation.

Pancreas: Normal in appearance without evidence of mass, ductal
dilation, or inflammation.

Spleen: Normal in size and appearance. Accessory splenule MEDIAL to
the spleen at the hilum.

Adrenals/Urinary Tract: Normal appearing adrenal glands. Obstructing
approximate 5 x 8 mm calculus in the proximal to mid RIGHT ureter
which accounts for moderate RIGHT hydronephrosis and RIGHT renal
edema. Mild delay in contrast excretion by the RIGHT kidney confirms
mild obstruction. Non-obstructing approximate 4 mm calculus in a
LOWER pole calyx of the RIGHT kidney. Very small (1-2 mm)
non-obstructing calculus in an UPPER pole calyx of the LEFT kidney.
Benign cortical cyst involving the mid LEFT kidney. No significant
parenchymal abnormality involving either kidney. Normal appearing
decompressed urinary bladder.

Stomach/Bowel: Stomach normal in appearance for the degree of
distention. Normal-appearing small bowel. Entire colon decompressed
and normal in appearance. Lipoma involving the ileocecal valve.
Appendix not conspicuous but no pericecal inflammation.

Vascular/Lymphatic: Mild to moderate aortoiliac atherosclerosis for
age. No evidence of aneurysm. Normal-appearing portal venous and
systemic venous systems.

No pathologic lymphadenopathy.

Reproductive: Normal appearing uterus and ovaries. Benign simple
cyst involving the LEFT ovary measuring approximately 2.6 x 2.5 cm.

Other: None.

Musculoskeletal: Regional skeleton unremarkable without acute or
significant osseous abnormality.
IMPRESSION: 1. Obstructing approximate 5 x 8 mm calculus in the proximal to mid
RIGHT ureter.
2. Non-obstructing approximate 4 mm calculus in a LOWER pole calyx
of the RIGHT kidney. Very small (1-2 mm) non-obstructing calculus in
an UPPER pole calyx of the LEFT kidney.
3. Mild to moderate aortoiliac atherosclerosis for age.

Aortic Atherosclerosis ([id number].C).

## 2020-09-17 ENCOUNTER — Other Ambulatory Visit: Payer: Self-pay | Admitting: Family Medicine

## 2020-09-17 ENCOUNTER — Other Ambulatory Visit: Payer: Self-pay | Admitting: Neurology

## 2020-09-17 DIAGNOSIS — M545 Low back pain, unspecified: Secondary | ICD-10-CM

## 2020-09-17 NOTE — Telephone Encounter (Signed)
Is this okay to refill? 

## 2020-09-17 NOTE — Telephone Encounter (Signed)
Ok for diclofenac. It seems one month early for the Linzess. Is she taking it more than prescribed?

## 2020-09-18 ENCOUNTER — Other Ambulatory Visit: Payer: Self-pay | Admitting: Neurology

## 2020-09-20 ENCOUNTER — Other Ambulatory Visit: Payer: Self-pay | Admitting: Family Medicine

## 2020-09-20 ENCOUNTER — Telehealth (HOSPITAL_COMMUNITY): Payer: Self-pay | Admitting: *Deleted

## 2020-09-20 DIAGNOSIS — F900 Attention-deficit hyperactivity disorder, predominantly inattentive type: Secondary | ICD-10-CM

## 2020-09-20 DIAGNOSIS — Z1231 Encounter for screening mammogram for malignant neoplasm of breast: Secondary | ICD-10-CM

## 2020-09-20 MED ORDER — AMPHETAMINE-DEXTROAMPHETAMINE 10 MG PO TABS: ORAL_TABLET | ORAL | 0 refills | Status: DC

## 2020-09-20 NOTE — Telephone Encounter (Signed)
Done

## 2020-09-20 NOTE — Telephone Encounter (Signed)
Patient needs refill of Adderall. Please review.

## 2020-09-27 DIAGNOSIS — H5202 Hypermetropia, left eye: Secondary | ICD-10-CM | POA: Diagnosis not present

## 2020-09-29 DIAGNOSIS — H524 Presbyopia: Secondary | ICD-10-CM | POA: Diagnosis not present

## 2020-09-29 DIAGNOSIS — H52209 Unspecified astigmatism, unspecified eye: Secondary | ICD-10-CM | POA: Diagnosis not present

## 2020-09-29 DIAGNOSIS — H5203 Hypermetropia, bilateral: Secondary | ICD-10-CM | POA: Diagnosis not present

## 2020-10-11 DIAGNOSIS — Z20822 Contact with and (suspected) exposure to covid-19: Secondary | ICD-10-CM | POA: Diagnosis not present

## 2020-10-18 ENCOUNTER — Telehealth (HOSPITAL_COMMUNITY): Payer: Self-pay | Admitting: *Deleted

## 2020-10-18 ENCOUNTER — Other Ambulatory Visit: Payer: Self-pay | Admitting: Neurology

## 2020-10-18 ENCOUNTER — Other Ambulatory Visit (HOSPITAL_COMMUNITY): Payer: Self-pay | Admitting: Psychiatry

## 2020-10-18 DIAGNOSIS — F4010 Social phobia, unspecified: Secondary | ICD-10-CM

## 2020-10-18 DIAGNOSIS — F900 Attention-deficit hyperactivity disorder, predominantly inattentive type: Secondary | ICD-10-CM

## 2020-10-18 DIAGNOSIS — F332 Major depressive disorder, recurrent severe without psychotic features: Secondary | ICD-10-CM

## 2020-10-18 MED ORDER — AMPHETAMINE-DEXTROAMPHETAMINE 10 MG PO TABS: ORAL_TABLET | ORAL | 0 refills | Status: DC

## 2020-10-18 NOTE — Telephone Encounter (Signed)
Pt called requesting refill of the Adderall. Last filled 11/15. Pt has an appointment on 11/09/20.

## 2020-10-18 NOTE — Telephone Encounter (Signed)
Done

## 2020-10-23 ENCOUNTER — Encounter (HOSPITAL_COMMUNITY): Payer: Self-pay | Admitting: Emergency Medicine

## 2020-10-23 ENCOUNTER — Other Ambulatory Visit: Payer: Self-pay

## 2020-10-23 DIAGNOSIS — M5441 Lumbago with sciatica, right side: Secondary | ICD-10-CM | POA: Diagnosis present

## 2020-10-23 DIAGNOSIS — M545 Low back pain, unspecified: Secondary | ICD-10-CM | POA: Diagnosis not present

## 2020-10-23 DIAGNOSIS — Z95828 Presence of other vascular implants and grafts: Secondary | ICD-10-CM | POA: Diagnosis not present

## 2020-10-23 DIAGNOSIS — Z0489 Encounter for examination and observation for other specified reasons: Secondary | ICD-10-CM | POA: Diagnosis not present

## 2020-10-23 DIAGNOSIS — Z79899 Other long term (current) drug therapy: Secondary | ICD-10-CM | POA: Insufficient documentation

## 2020-10-23 DIAGNOSIS — S300XXA Contusion of lower back and pelvis, initial encounter: Secondary | ICD-10-CM | POA: Insufficient documentation

## 2020-10-23 DIAGNOSIS — N189 Chronic kidney disease, unspecified: Secondary | ICD-10-CM | POA: Insufficient documentation

## 2020-10-23 NOTE — ED Notes (Signed)
Pt requesting pain medication during triage and obtaining v/s. Advised pt that doctor would need to see her before pain medication could be given.
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
## 2020-10-24 ENCOUNTER — Emergency Department (HOSPITAL_COMMUNITY)
Admission: EM | Admit: 2020-10-24 | Discharge: 2020-10-24 | Disposition: A | Discharge status: Home / Self Care | Payer: Medicare HMO | Attending: Emergency Medicine | Admitting: Emergency Medicine

## 2020-10-24 ENCOUNTER — Emergency Department (HOSPITAL_COMMUNITY): Payer: Medicare HMO

## 2020-10-24 DIAGNOSIS — S20221A Contusion of right back wall of thorax, initial encounter: Secondary | ICD-10-CM

## 2020-10-24 DIAGNOSIS — Z0489 Encounter for examination and observation for other specified reasons: Secondary | ICD-10-CM | POA: Diagnosis not present

## 2020-10-24 LAB — URINALYSIS, ROUTINE W REFLEX MICROSCOPIC
Bilirubin Urine: NEGATIVE
Glucose, UA: NEGATIVE mg/dL
Ketones, ur: 20 mg/dL — AB
Nitrite: NEGATIVE
Protein, ur: 100 mg/dL — AB
RBC / HPF: >50 RBC/hpf — ABNORMAL HIGH (ref 0–5)
Specific Gravity, Urine: 1.016 (ref 1.005–1.030)
WBC, UA: >50 WBC/hpf — ABNORMAL HIGH (ref 0–5)
pH: 6 (ref 5.0–8.0)

## 2020-10-24 MED ORDER — KETOROLAC TROMETHAMINE 30 MG/ML IJ SOLN
30.0000 mg | Freq: Once | INTRAMUSCULAR | Status: AC
  Administered 2020-10-24: 30 mg via INTRAMUSCULAR
  Filled 2020-10-24: qty 1

## 2020-10-24 MED ORDER — ONDANSETRON 8 MG PO TBDP
8.0000 mg | ORAL_TABLET | Freq: Once | ORAL | Status: AC
  Administered 2020-10-24: 8 mg via ORAL
  Filled 2020-10-24: qty 1

## 2020-10-24 MED ORDER — BACLOFEN 10 MG PO TABS: 10.0000 mg | ORAL_TABLET | Freq: Two times a day (BID) | ORAL | 0 refills | Status: DC

## 2020-10-24 MED ORDER — IBUPROFEN 600 MG PO TABS: 600.0000 mg | ORAL_TABLET | Freq: Four times a day (QID) | ORAL | 0 refills | Status: DC | PRN

## 2020-10-24 MED ORDER — OXYCODONE-ACETAMINOPHEN 5-325 MG PO TABS
1.0000 | ORAL_TABLET | Freq: Once | ORAL | Status: AC
Start: 2020-10-24 — End: 2020-10-24
  Administered 2020-10-24: 1 via ORAL
  Filled 2020-10-24: qty 1
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
## 2020-10-24 NOTE — Discharge Instructions (Addendum)
Take medications as prescribed. If pain continues, follow up with your doctor for recheck and further management.

## 2020-10-24 NOTE — ED Notes (Signed)
Patient transported to X-ray 
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
## 2020-10-25 LAB — URINE CULTURE

## 2020-10-26 ENCOUNTER — Encounter: Payer: Self-pay | Admitting: Family Medicine

## 2020-10-26 ENCOUNTER — Encounter (HOSPITAL_COMMUNITY): Payer: Self-pay

## 2020-10-26 ENCOUNTER — Emergency Department (HOSPITAL_COMMUNITY)
Admission: EM | Admit: 2020-10-26 | Discharge: 2020-10-27 | Disposition: A | Discharge status: Home / Self Care | Payer: Medicare HMO | Attending: Emergency Medicine | Admitting: Emergency Medicine

## 2020-10-26 ENCOUNTER — Ambulatory Visit (INDEPENDENT_AMBULATORY_CARE_PROVIDER_SITE_OTHER): Payer: Medicare HMO | Admitting: Family Medicine

## 2020-10-26 ENCOUNTER — Other Ambulatory Visit: Payer: Self-pay

## 2020-10-26 VITALS — BP 122/74 | HR 81 | Temp 97.9°F

## 2020-10-26 DIAGNOSIS — N23 Unspecified renal colic: Secondary | ICD-10-CM | POA: Diagnosis not present

## 2020-10-26 DIAGNOSIS — R109 Unspecified abdominal pain: Secondary | ICD-10-CM

## 2020-10-26 DIAGNOSIS — N201 Calculus of ureter: Secondary | ICD-10-CM | POA: Diagnosis not present

## 2020-10-26 DIAGNOSIS — R319 Hematuria, unspecified: Secondary | ICD-10-CM

## 2020-10-26 DIAGNOSIS — R1032 Left lower quadrant pain: Secondary | ICD-10-CM | POA: Diagnosis not present

## 2020-10-26 DIAGNOSIS — N200 Calculus of kidney: Secondary | ICD-10-CM | POA: Diagnosis not present

## 2020-10-26 DIAGNOSIS — R110 Nausea: Secondary | ICD-10-CM

## 2020-10-26 DIAGNOSIS — Z20822 Contact with and (suspected) exposure to covid-19: Secondary | ICD-10-CM | POA: Insufficient documentation

## 2020-10-26 DIAGNOSIS — R112 Nausea with vomiting, unspecified: Secondary | ICD-10-CM | POA: Diagnosis not present

## 2020-10-26 LAB — COMPREHENSIVE METABOLIC PANEL
ALT: 14 U/L (ref 0–44)
AST: 15 U/L (ref 15–41)
Albumin: 4 g/dL (ref 3.5–5.0)
Alkaline Phosphatase: 93 U/L (ref 38–126)
Anion gap: 8 (ref 5–15)
BUN: 20 mg/dL (ref 6–20)
CO2: 29 mmol/L (ref 22–32)
Calcium: 9.2 mg/dL (ref 8.9–10.3)
Chloride: 105 mmol/L (ref 98–111)
Creatinine, Ser: 1.01 mg/dL — ABNORMAL HIGH (ref 0.44–1.00)
GFR, Estimated: >60 mL/min (ref >60)
Glucose, Bld: 110 mg/dL — ABNORMAL HIGH (ref 70–99)
Potassium: 3.1 mmol/L — ABNORMAL LOW (ref 3.5–5.1)
Sodium: 142 mmol/L (ref 135–145)
Total Bilirubin: 0.4 mg/dL (ref 0.3–1.2)
Total Protein: 7.4 g/dL (ref 6.5–8.1)

## 2020-10-26 LAB — CBC WITH DIFFERENTIAL/PLATELET
Abs Immature Granulocytes: 0.03 10*3/uL (ref 0.00–0.07)
Basophils Absolute: 0.1 10*3/uL (ref 0.0–0.1)
Basophils Relative: 1 %
Eosinophils Absolute: 0.1 10*3/uL (ref 0.0–0.5)
Eosinophils Relative: 1 %
HCT: 43.2 % (ref 36.0–46.0)
Hemoglobin: 14.1 g/dL (ref 12.0–15.0)
Immature Granulocytes: 0 %
Lymphocytes Relative: 13 %
Lymphs Abs: 1.9 10*3/uL (ref 0.7–4.0)
MCH: 30.2 pg (ref 26.0–34.0)
MCHC: 32.6 g/dL (ref 30.0–36.0)
MCV: 92.5 fL (ref 80.0–100.0)
Monocytes Absolute: 0.7 10*3/uL (ref 0.1–1.0)
Monocytes Relative: 5 %
Neutro Abs: 11.8 10*3/uL — ABNORMAL HIGH (ref 1.7–7.7)
Neutrophils Relative %: 80 %
Platelets: 327 10*3/uL (ref 150–400)
RBC: 4.67 MIL/uL (ref 3.87–5.11)
RDW: 12.2 % (ref 11.5–15.5)
WBC: 14.5 10*3/uL — ABNORMAL HIGH (ref 4.0–10.5)
nRBC: 0 % (ref 0.0–0.2)

## 2020-10-26 LAB — I-STAT BETA HCG BLOOD, ED (MC, WL, AP ONLY): I-stat hCG, quantitative: <5 m[IU]/mL (ref <5)

## 2020-10-26 MED ORDER — FENTANYL CITRATE (PF) 100 MCG/2ML IJ SOLN
50.0000 ug | Freq: Once | INTRAMUSCULAR | Status: AC
  Administered 2020-10-27: 50 ug via INTRAVENOUS
  Filled 2020-10-26: qty 2

## 2020-10-26 MED ORDER — KETOROLAC TROMETHAMINE 60 MG/2ML IM SOLN
60.0000 mg | Freq: Once | INTRAMUSCULAR | Status: AC
  Administered 2020-10-26: 60 mg via INTRAMUSCULAR

## 2020-10-26 NOTE — ED Triage Notes (Signed)
Patient states she was seen on 10/24/20 and was diagnosed with a left renal contusion. Patient states her pain is worse and went to her PCp today.  Patient was given Toradol 60 mg IM at 1500 today and her PCP's office today. Patient was instructed to come to the ED due to peritoneal signs and rebound tenderness.
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
## 2020-10-26 NOTE — Addendum Note (Signed)
Addended by: Herminio Commons A on: 10/26/2020 04:37 PM   Modules accepted: Orders
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
## 2020-10-26 NOTE — Patient Instructions (Signed)
She is having peritoneal signs. Rebound tenderness. Concerning for renal trauma.   Please evaluate urgently.

## 2020-10-27 ENCOUNTER — Encounter (HOSPITAL_COMMUNITY): Payer: Self-pay

## 2020-10-27 ENCOUNTER — Emergency Department (HOSPITAL_COMMUNITY): Payer: Medicare HMO

## 2020-10-27 ENCOUNTER — Other Ambulatory Visit: Payer: Self-pay | Admitting: Urology

## 2020-10-27 ENCOUNTER — Emergency Department (HOSPITAL_COMMUNITY)
Admission: EM | Admit: 2020-10-27 | Discharge: 2020-10-27 | Disposition: A | Discharge status: Home / Self Care | Payer: Medicare HMO | Source: Home / Self Care

## 2020-10-27 ENCOUNTER — Other Ambulatory Visit: Payer: Self-pay

## 2020-10-27 ENCOUNTER — Encounter (HOSPITAL_BASED_OUTPATIENT_CLINIC_OR_DEPARTMENT_OTHER): Payer: Self-pay | Admitting: Urology

## 2020-10-27 DIAGNOSIS — N201 Calculus of ureter: Secondary | ICD-10-CM

## 2020-10-27 DIAGNOSIS — Z5321 Procedure and treatment not carried out due to patient leaving prior to being seen by health care provider: Secondary | ICD-10-CM | POA: Insufficient documentation

## 2020-10-27 DIAGNOSIS — R109 Unspecified abdominal pain: Secondary | ICD-10-CM | POA: Insufficient documentation

## 2020-10-27 DIAGNOSIS — N23 Unspecified renal colic: Secondary | ICD-10-CM | POA: Diagnosis not present

## 2020-10-27 DIAGNOSIS — R1032 Left lower quadrant pain: Secondary | ICD-10-CM | POA: Diagnosis not present

## 2020-10-27 DIAGNOSIS — R112 Nausea with vomiting, unspecified: Secondary | ICD-10-CM | POA: Diagnosis not present

## 2020-10-27 LAB — URINALYSIS, ROUTINE W REFLEX MICROSCOPIC
Bacteria, UA: NONE SEEN
Bilirubin Urine: NEGATIVE
Glucose, UA: NEGATIVE mg/dL
Ketones, ur: NEGATIVE mg/dL
Nitrite: NEGATIVE
Protein, ur: 30 mg/dL — AB
RBC / HPF: >50 RBC/hpf — ABNORMAL HIGH (ref 0–5)
Specific Gravity, Urine: >1.046 — ABNORMAL HIGH (ref 1.005–1.030)
WBC, UA: >50 WBC/hpf — ABNORMAL HIGH (ref 0–5)
pH: 6 (ref 5.0–8.0)

## 2020-10-27 LAB — RESP PANEL BY RT-PCR (FLU A&B, COVID) ARPGX2
Influenza A by PCR: NEGATIVE
Influenza B by PCR: NEGATIVE
SARS Coronavirus 2 by RT PCR: NEGATIVE

## 2020-10-27 MED ORDER — FENTANYL CITRATE (PF) 100 MCG/2ML IJ SOLN
100.0000 ug | Freq: Once | INTRAMUSCULAR | Status: AC
Start: 2020-10-27 — End: 2020-10-27
  Administered 2020-10-27: 100 ug via INTRAVENOUS

## 2020-10-27 MED ORDER — IOHEXOL 300 MG/ML  SOLN
100.0000 mL | Freq: Once | INTRAMUSCULAR | Status: AC | PRN
  Administered 2020-10-27: 100 mL via INTRAVENOUS

## 2020-10-27 MED ORDER — ONDANSETRON HCL 4 MG PO TABS: 4.0000 mg | ORAL_TABLET | Freq: Three times a day (TID) | ORAL | 0 refills | Status: DC | PRN

## 2020-10-27 MED ORDER — CIPROFLOXACIN HCL 500 MG PO TABS: 500.0000 mg | ORAL_TABLET | Freq: Two times a day (BID) | ORAL | 0 refills | Status: DC

## 2020-10-27 MED ORDER — OXYCODONE-ACETAMINOPHEN 5-325 MG PO TABS: 2.0000 | ORAL_TABLET | ORAL | 0 refills | Status: DC | PRN

## 2020-10-27 MED ORDER — HYDROCODONE-ACETAMINOPHEN 5-325 MG PO TABS
2.0000 | ORAL_TABLET | Freq: Once | ORAL | Status: AC
  Administered 2020-10-27: 2 via ORAL
  Filled 2020-10-27: qty 2

## 2020-10-27 MED ORDER — FENTANYL CITRATE (PF) 100 MCG/2ML IJ SOLN
INTRAMUSCULAR | Status: AC
  Filled 2020-10-27: qty 2

## 2020-10-27 NOTE — ED Provider Notes (Signed)
Medical screening examination/treatment/procedure(s) were conducted as a shared visit with non-physician practitioner(s) and myself.  I personally evaluated the patient during the encounter.  Kidney stone.likely infected. Planned for Dr. Marlou Porch eval and treat.  Dr. Marlou Porch will stent tomorrow. Recommends pain/nausea control.  Patient appears well. Pain controlled. Will dc w/ Rx.         Kimala Horne, Barbara Cower, MD 10/29/20 [phone]

## 2020-10-27 NOTE — Progress Notes (Signed)
Talked with patient. Arrival time 0745 due to filling out papers. Instructions given NPO after MN Husband is the driver

## 2020-10-27 NOTE — ED Notes (Signed)
Eloped from waiting area. Called 3x.  
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
## 2020-10-27 NOTE — ED Triage Notes (Signed)
Pt reports worsening pain since seen yesterday. Pt says she is scheduled for lithotripsy tomorrow.

## 2020-10-28 ENCOUNTER — Ambulatory Visit (HOSPITAL_COMMUNITY): Payer: Medicare HMO

## 2020-10-28 ENCOUNTER — Ambulatory Visit: Payer: Medicare HMO

## 2020-10-28 ENCOUNTER — Ambulatory Visit (HOSPITAL_COMMUNITY)
Admission: RE | Admit: 2020-10-28 | Discharge: 2020-10-28 | Disposition: A | Discharge status: Home / Self Care | Payer: Medicare HMO | Source: Ambulatory Visit | Attending: Urology | Admitting: Urology

## 2020-10-28 ENCOUNTER — Encounter (HOSPITAL_BASED_OUTPATIENT_CLINIC_OR_DEPARTMENT_OTHER)
Admission: RE | Disposition: A | Discharge status: Home / Self Care | Payer: Self-pay | Source: Ambulatory Visit | Attending: Urology

## 2020-10-28 ENCOUNTER — Encounter (HOSPITAL_BASED_OUTPATIENT_CLINIC_OR_DEPARTMENT_OTHER): Payer: Self-pay | Admitting: Urology

## 2020-10-28 ENCOUNTER — Other Ambulatory Visit: Payer: Self-pay

## 2020-10-28 DIAGNOSIS — N201 Calculus of ureter: Secondary | ICD-10-CM | POA: Diagnosis not present

## 2020-10-28 DIAGNOSIS — Z01818 Encounter for other preprocedural examination: Secondary | ICD-10-CM | POA: Diagnosis not present

## 2020-10-28 DIAGNOSIS — N132 Hydronephrosis with renal and ureteral calculous obstruction: Secondary | ICD-10-CM | POA: Diagnosis not present

## 2020-10-28 DIAGNOSIS — N200 Calculus of kidney: Secondary | ICD-10-CM

## 2020-10-28 HISTORY — PX: EXTRACORPOREAL SHOCK WAVE LITHOTRIPSY: SHX1557

## 2020-10-28 SURGERY — LITHOTRIPSY, ESWL
Anesthesia: Choice | Laterality: Left

## 2020-10-28 MED ORDER — DIPHENHYDRAMINE HCL 25 MG PO CAPS
ORAL_CAPSULE | ORAL | Status: AC
  Filled 2020-10-28: qty 1

## 2020-10-28 MED ORDER — DIAZEPAM 5 MG PO TABS
ORAL_TABLET | ORAL | Status: AC
  Filled 2020-10-28: qty 2

## 2020-10-28 MED ORDER — DIPHENHYDRAMINE HCL 25 MG PO CAPS
25.0000 mg | ORAL_CAPSULE | ORAL | Status: AC
  Administered 2020-10-28: 25 mg via ORAL

## 2020-10-28 MED ORDER — FENTANYL CITRATE (PF) 100 MCG/2ML IJ SOLN
25.0000 ug | INTRAMUSCULAR | Status: DC | PRN
  Administered 2020-10-28: 50 ug via INTRAVENOUS

## 2020-10-28 MED ORDER — CIPROFLOXACIN HCL 500 MG PO TABS
500.0000 mg | ORAL_TABLET | ORAL | Status: AC
  Administered 2020-10-28: 500 mg via ORAL

## 2020-10-28 MED ORDER — SODIUM CHLORIDE 0.9 % IV SOLN: INTRAVENOUS | Status: DC

## 2020-10-28 MED ORDER — DIAZEPAM 5 MG PO TABS
10.0000 mg | ORAL_TABLET | ORAL | Status: AC
  Administered 2020-10-28: 10 mg via ORAL

## 2020-10-28 MED ORDER — FENTANYL CITRATE (PF) 100 MCG/2ML IJ SOLN
INTRAMUSCULAR | Status: AC
  Filled 2020-10-28: qty 2

## 2020-10-28 MED ORDER — OXYCODONE HCL 5 MG PO TABS: 5.0000 mg | ORAL_TABLET | Freq: Three times a day (TID) | ORAL | 0 refills | Status: DC | PRN

## 2020-10-28 MED ORDER — CIPROFLOXACIN HCL 500 MG PO TABS
ORAL_TABLET | ORAL | Status: AC
  Filled 2020-10-28: qty 1

## 2020-10-28 NOTE — H&P (Signed)
See scanned in H&P

## 2020-10-28 NOTE — Discharge Instructions (Signed)
See [facility] discharge instructions in chart.  

## 2020-10-28 NOTE — Op Note (Signed)
See [facility] OP note scanned into chart. Also because of the size, density, location and other factors that cannot be anticipated I feel this will likely be a staged procedure. This fact supersedes any indication in the scanned [facility] operative note to the contrary.  

## 2020-11-01 ENCOUNTER — Encounter (HOSPITAL_BASED_OUTPATIENT_CLINIC_OR_DEPARTMENT_OTHER): Payer: Self-pay | Admitting: Urology

## 2020-11-02 ENCOUNTER — Telehealth: Payer: Self-pay | Admitting: Neurology

## 2020-11-02 MED ORDER — MECLIZINE HCL 25 MG PO TABS: ORAL_TABLET | ORAL | 0 refills | Status: DC

## 2020-11-02 NOTE — Telephone Encounter (Addendum)
I returned the call to the patient. She was last seen 08/19/19 with a plan to follow up in six months. She canceled that appointment and never rescheduled. She now has a pending follow up on 01/20/21. She is asking for refills of Toradol and Meclizine.   Per [place] narcotic registry, she just received #20 of oxycodone on 10/28/20. States this was given to her due to kidney stones. She still has some of this medication at home.  Meclizine refill sent to the pharmacy today due to documentation in last note. Request for Toradol sent to Dr. Anne Hahn to address on 11/08/20. It does not appear this prescription has been sent by our office in the past. The [place] narcotic registry did not show her last refill of this medication.This indicates it has been more than a year since she last had it.

## 2020-11-02 NOTE — Telephone Encounter (Signed)
PT is requesting a refill on migraine medication. I let her know I would pass on the message but it may not get filled because she did not show in [place] and hasn't been seen in awhile. I made her an appt for the first available between Sarah and Dr. Anne Hahn and added her to the wait list.

## 2020-11-02 NOTE — Addendum Note (Signed)
Addended by: Lindell Spar C on: 11/02/2020 01:57 PM   Modules accepted: Orders

## 2020-11-03 MED ORDER — KETOROLAC TROMETHAMINE 10 MG PO TABS: 10.0000 mg | ORAL_TABLET | Freq: Four times a day (QID) | ORAL | 0 refills | Status: DC | PRN

## 2020-11-03 NOTE — Telephone Encounter (Signed)
This patient has been on Toradol in the past, I will send in a small prescription for her until she can be seen.

## 2020-11-03 NOTE — Addendum Note (Signed)
Addended by: York Spaniel on: 11/03/2020 06:18 PM   Modules accepted: Orders

## 2020-11-05 ENCOUNTER — Other Ambulatory Visit (HOSPITAL_COMMUNITY): Payer: Self-pay | Admitting: Psychiatry

## 2020-11-05 DIAGNOSIS — F332 Major depressive disorder, recurrent severe without psychotic features: Secondary | ICD-10-CM

## 2020-11-05 DIAGNOSIS — F4010 Social phobia, unspecified: Secondary | ICD-10-CM

## 2020-11-09 ENCOUNTER — Telehealth: Payer: Self-pay

## 2020-11-09 ENCOUNTER — Other Ambulatory Visit: Payer: Self-pay

## 2020-11-09 ENCOUNTER — Telehealth (INDEPENDENT_AMBULATORY_CARE_PROVIDER_SITE_OTHER): Payer: Medicare HMO | Admitting: Psychiatry

## 2020-11-09 ENCOUNTER — Encounter (HOSPITAL_COMMUNITY): Payer: Self-pay | Admitting: Psychiatry

## 2020-11-09 VITALS — Wt 183.0 lb

## 2020-11-09 DIAGNOSIS — F332 Major depressive disorder, recurrent severe without psychotic features: Secondary | ICD-10-CM

## 2020-11-09 DIAGNOSIS — F4321 Adjustment disorder with depressed mood: Secondary | ICD-10-CM | POA: Diagnosis not present

## 2020-11-09 DIAGNOSIS — F4010 Social phobia, unspecified: Secondary | ICD-10-CM | POA: Diagnosis not present

## 2020-11-09 DIAGNOSIS — F900 Attention-deficit hyperactivity disorder, predominantly inattentive type: Secondary | ICD-10-CM | POA: Diagnosis not present

## 2020-11-09 MED ORDER — FLUOXETINE HCL 40 MG PO CAPS: 40.0000 mg | ORAL_CAPSULE | Freq: Every day | ORAL | 2 refills | Status: DC

## 2020-11-09 MED ORDER — VENLAFAXINE HCL ER 75 MG PO CP24: 75.0000 mg | ORAL_CAPSULE | Freq: Every day | ORAL | 2 refills | Status: DC

## 2020-11-09 MED ORDER — LINACLOTIDE 290 MCG PO CAPS: ORAL_CAPSULE | ORAL | 0 refills | Status: DC

## 2020-11-09 MED ORDER — BUSPIRONE HCL 30 MG PO TABS: 30.0000 mg | ORAL_TABLET | Freq: Two times a day (BID) | ORAL | 2 refills | Status: DC

## 2020-11-09 MED ORDER — QUETIAPINE FUMARATE 100 MG PO TABS: 100.0000 mg | ORAL_TABLET | Freq: Every day | ORAL | 2 refills | Status: DC

## 2020-11-09 MED ORDER — AMPHETAMINE-DEXTROAMPHETAMINE 10 MG PO TABS: ORAL_TABLET | ORAL | 0 refills | Status: DC

## 2020-11-09 NOTE — Progress Notes (Signed)
Virtual Visit via Telephone Note  I connected with Rebecca Andrade on 11/09/20 at  9:00 AM EST by telephone and verified that I am speaking with the correct person using two identifiers.  Location: Patient: Home Provider: Home Office   I discussed the limitations, risks, security and privacy concerns of performing an evaluation and management service by telephone and the availability of in person appointments. I also discussed with the patient that there may be a patient responsible charge related to this service. The patient expressed understanding and agreed to proceed.   History of Present Illness: Patient is evaluated on the phone. She is on the phone by herself. She is very sad and tearful because her brother who was diagnosed with brain tumor died passed away day before the new year. She admitted thinking about him. Patient told funeral is this Saturday. She had a good support from her younger brother and her son who is checking on her. Patient also had recently procedure for kidney stone and she was given pain medicine but she has stopped taking narcotic pain medication. She feels that she had a good support system and she will recover from grief. She also reported current medicine working as she reported no major concerns from the medication. She has no tremors or shakes. She still have anxiety and does not go outside unless she is with her son or mother. However she does take the dog outside every day. Her appetite is okay. Her weight is stable. She denies any hopelessness or any suicidal thoughts. Her energy level is okay and she is able to do multitasking. Recently she had migraine headaches and she was given Toradol which helps. Her attention concentration is a stable. She does not want to change the medication. She denies drinking or using any illegal substances.   Past Psychiatric History:Reviewed. H/Otaking antidepressants since 1990. H/Oinpatient twice,onein 2005  andin2014.H/Ococaine use,paranoia, hallucination, mood swing, anger and mania.No h/osuicidal attempt.TriedZoloft, Paxil, Lexapro, Wellbutrin, lithium, Remeron, Topamax, Abilify, Pristiq, Effexor, amitriptyline, Xanax, temazepam, Valium and Cymbalta. PCPprscribed Adderall for ADD.  Recent Results (from the past 2160 hour(s))  Urinalysis, Routine w reflex microscopic Urine, Clean Catch     Status: Abnormal   Collection Time: 10/24/20  3:17 AM  Result Value Ref Range   Color, Urine YELLOW YELLOW   APPearance HAZY (A) CLEAR   Specific Gravity, Urine 1.016 1.005 - 1.030   pH 6.0 5.0 - 8.0   Glucose, UA NEGATIVE NEGATIVE mg/dL   Hgb urine dipstick LARGE (A) NEGATIVE   Bilirubin Urine NEGATIVE NEGATIVE   Ketones, ur 20 (A) NEGATIVE mg/dL   Protein, ur 323 (A) NEGATIVE mg/dL   Nitrite NEGATIVE NEGATIVE   Leukocytes,Ua SMALL (A) NEGATIVE   RBC / HPF >50 (H) 0 - 5 RBC/hpf   WBC, UA >50 (H) 0 - 5 WBC/hpf   Bacteria, UA RARE (A) NONE SEEN   Squamous Epithelial / LPF 0-5 0 - 5   Mucus PRESENT     Comment: Performed at [facility], [address] [place], [place] [address]  Urine culture     Status: Abnormal   Collection Time: 10/24/20  3:55 AM   Specimen: Urine, Clean Catch  Result Value Ref Range   Specimen Description      URINE, CLEAN CATCH Performed at [facility], [address], [place], [place] [address]    Special Requests      NONE Performed at [facility], [address] [place],  [place] [address]    Culture MULTIPLE SPECIES PRESENT, SUGGEST RECOLLECTION (A)    Report Status 10/25/2020 FINAL   CBC with Differential     Status: Abnormal   Collection Time: 10/26/20  5:51 PM  Result Value Ref Range   WBC 14.5 (H) 4.0 - 10.5 K/uL   RBC 4.67 3.87 - 5.11 MIL/uL   Hemoglobin 14.1 12.0 - 15.0 g/dL   HCT 72.5 36.6 - 44.0 %   MCV 92.5 80.0 - 100.0 fL   MCH 30.2 26.0 - 34.0 pg   MCHC 32.6 30.0 - 36.0 g/dL    RDW 34.7 42.5 - 95.6 %   Platelets 327 150 - 400 K/uL   nRBC 0.0 0.0 - 0.2 %   Neutrophils Relative % 80 %   Neutro Abs 11.8 (H) 1.7 - 7.7 K/uL   Lymphocytes Relative 13 %   Lymphs Abs 1.9 0.7 - 4.0 K/uL   Monocytes Relative 5 %   Monocytes Absolute 0.7 0.1 - 1.0 K/uL   Eosinophils Relative 1 %   Eosinophils Absolute 0.1 0.0 - 0.5 K/uL   Basophils Relative 1 %   Basophils Absolute 0.1 0.0 - 0.1 K/uL   Immature Granulocytes 0 %   Abs Immature Granulocytes 0.03 0.00 - 0.07 K/uL    Comment: Performed at [facility], [address], [place], [place] [address]  Comprehensive metabolic panel     Status: Abnormal   Collection Time: 10/26/20  5:51 PM  Result Value Ref Range   Sodium 142 135 - 145 mmol/L   Potassium 3.1 (L) 3.5 - 5.1 mmol/L   Chloride 105 98 - 111 mmol/L   CO2 29 22 - 32 mmol/L   Glucose, Bld 110 (H) 70 - 99 mg/dL    Comment: Glucose reference range applies only to samples taken after fasting for at least 8 hours.   BUN 20 6 - 20 mg/dL   Creatinine, Ser 4.33 (H) 0.44 - 1.00 mg/dL   Calcium 9.2 8.9 - 29.5 mg/dL   Total Protein 7.4 6.5 - 8.1 g/dL   Albumin 4.0 3.5 - 5.0 g/dL   AST 15 15 - 41 U/L   ALT 14 0 - 44 U/L   Alkaline Phosphatase 93 38 - 126 U/L   Total Bilirubin 0.4 0.3 - 1.2 mg/dL   GFR, Estimated >18 >84 mL/min    Comment: (NOTE) Calculated using the CKD-EPI Creatinine Equation (2021)    Anion gap 8 5 - 15    Comment: Performed at [facility], [address] [place], [place] [address]  I-Stat Beta hCG blood, ED ([facility], [facility], [facility] only)     Status: None   Collection Time: 10/26/20  6:11 PM  Result Value Ref Range   I-stat hCG, quantitative <5.0 <5 mIU/mL   Comment 3            Comment:   GEST. AGE      CONC.  (mIU/mL)   <=1 WEEK        5 - 50     2 WEEKS       50 - 500     3 WEEKS       100 - 10,000     4 WEEKS     1,000 - 30,000        FEMALE AND NON-PREGNANT FEMALE:     LESS THAN 5 mIU/mL   Urinalysis, Routine w  reflex microscopic Urine, Clean Catch     Status: Abnormal  Collection Time: 10/27/20  4:30 AM  Result Value Ref Range   Color, Urine YELLOW YELLOW   APPearance CLOUDY (A) CLEAR   Specific Gravity, Urine >1.046 (H) 1.005 - 1.030   pH 6.0 5.0 - 8.0   Glucose, UA NEGATIVE NEGATIVE mg/dL   Hgb urine dipstick MODERATE (A) NEGATIVE   Bilirubin Urine NEGATIVE NEGATIVE   Ketones, ur NEGATIVE NEGATIVE mg/dL   Protein, ur 30 (A) NEGATIVE mg/dL   Nitrite NEGATIVE NEGATIVE   Leukocytes,Ua LARGE (A) NEGATIVE   RBC / HPF >50 (H) 0 - 5 RBC/hpf   WBC, UA >50 (H) 0 - 5 WBC/hpf   Bacteria, UA NONE SEEN NONE SEEN   Squamous Epithelial / LPF 6-10 0 - 5    Comment: Performed at [facility], [address], [place], [place] [address]  Resp Panel by RT-PCR (Flu A&B, Covid) Nasopharyngeal Swab     Status: None   Collection Time: 10/27/20  7:46 AM   Specimen: Nasopharyngeal Swab; Nasopharyngeal(NP) swabs in vial transport medium  Result Value Ref Range   SARS Coronavirus 2 by RT PCR NEGATIVE NEGATIVE    Comment: (NOTE) SARS-CoV-2 target nucleic acids are NOT DETECTED.  The SARS-CoV-2 RNA is generally detectable in upper respiratory specimens during the acute phase of infection. The lowest concentration of SARS-CoV-2 viral copies this assay can detect is 138 copies/mL. A negative result does not preclude SARS-Cov-2 infection and should not be used as the sole basis for treatment or other patient management decisions. A negative result may occur with  improper specimen collection/handling, submission of specimen other than nasopharyngeal swab, presence of viral mutation(s) within the areas targeted by this assay, and inadequate number of viral copies(<138 copies/mL). A negative result must be combined with clinical observations, patient history, and epidemiological information. The expected result is Negative.  Fact Sheet for Patients:   BloggerCourse.com  Fact Sheet for Healthcare Providers:  SeriousBroker.it  This test is no t yet approved or cleared by the [place] FDA and  has been authorized for detection and/or diagnosis of SARS-CoV-2 by FDA under an Emergency Use Authorization (EUA). This EUA will remain  in effect (meaning this test can be used) for the duration of the COVID-19 declaration under Section 564(b)(1) of the Act, 21 U.S.C.section 360bbb-3(b)(1), unless the authorization is terminated  or revoked sooner.       Influenza A by PCR NEGATIVE NEGATIVE   Influenza B by PCR NEGATIVE NEGATIVE    Comment: (NOTE) The Xpert Xpress SARS-CoV-2/FLU/RSV plus assay is intended as an aid in the diagnosis of influenza from Nasopharyngeal swab specimens and should not be used as a sole basis for treatment. Nasal washings and aspirates are unacceptable for Xpert Xpress SARS-CoV-2/FLU/RSV testing.  Fact Sheet for Patients: BloggerCourse.com  Fact Sheet for Healthcare Providers: SeriousBroker.it  This test is not yet approved or cleared by the [place] FDA and has been authorized for detection and/or diagnosis of SARS-CoV-2 by FDA under an Emergency Use Authorization (EUA). This EUA will remain in effect (meaning this test can be used) for the duration of the COVID-19 declaration under Section 564(b)(1) of the Act, 21 U.S.C. section 360bbb-3(b)(1), unless the authorization is terminated or revoked.  Performed at [facility], [address], [place], [place] [address]      Psychiatric Specialty Exam: Physical Exam  Review of Systems  Weight 183 lb (83 kg).There is no height or weight on file to calculate BMI.  General Appearance: NA  Eye  Contact:  NA  Speech:  Slow  Volume:  Decreased  Mood:  Dysphoric  Affect:  NA  Thought Process:  Descriptions of Associations: Intact   Orientation:  Full (Time, Place, and Person)  Thought Content:  Rumination  Suicidal Thoughts:  No  Homicidal Thoughts:  No  Memory:  Immediate;   Good Recent;   Fair Remote;   Fair  Judgement:  Intact  Insight:  Present  Psychomotor Activity:  NA  Concentration:  Concentration: Fair and Attention Span: Fair  Recall:  [employer] of Knowledge:  Fair  Language:  Fair  Akathisia:  No  Handed:  Right  AIMS (if indicated):     Assets:  Communication Skills Desire for Improvement Housing Social Support  ADL's:  Intact  Cognition:  WNL  Sleep:   fair      Assessment and Plan: Major depressive disorder, recurrent. Social anxiety disorder. Attention deficit disorder, inattentive type. Grief.  I review her blood work results, current medication. Discussed grief counseling through hospice and patient agree to call them. She does not want to change the medication as she feels they are working and she had a good support from her son and her younger brother. Continue Prozac 40 mg daily, Adderall 10 mg to take 2 tablet in the morning and 2 tablets in the afternoon, Seroquel 100 mg at bedtime, BuSpar 30 mg twice a day and venlafaxine 75 mg in the morning. We discussed polypharmacy. Patient is no longer taking any narcotic pain medication. Recommended to call us back if there is any question or any concern. Follow-up in 3 months.  Follow Up Instructions:    I discussed the assessment and treatment plan with the patient. The patient was provided an opportunity to ask questions and all were answered. The patient agreed with the plan and demonstrated an understanding of the instructions.   The patient was advised to call back or seek an in-person evaluation if the symptoms worsen or if the condition fails to improve as anticipated.  I provided 19 minutes of non-face-to-face time during this encounter.   Kathlee Nations, MD

## 2020-11-09 NOTE — Telephone Encounter (Signed)
Pt. Called stating that she needs a refill on her linzess pt. Apt is 10/26/20.

## 2020-11-09 NOTE — Telephone Encounter (Signed)
Sent in med

## 2020-11-19 ENCOUNTER — Other Ambulatory Visit: Payer: Self-pay

## 2020-11-19 ENCOUNTER — Telehealth: Payer: Medicare HMO | Admitting: Medical

## 2020-11-22 NOTE — Progress Notes (Signed)
error 

## 2020-11-24 ENCOUNTER — Encounter: Payer: Self-pay | Admitting: Family Medicine

## 2020-11-24 ENCOUNTER — Telehealth (INDEPENDENT_AMBULATORY_CARE_PROVIDER_SITE_OTHER): Payer: Medicare HMO | Admitting: Family Medicine

## 2020-11-24 ENCOUNTER — Other Ambulatory Visit: Payer: Self-pay

## 2020-11-24 ENCOUNTER — Telehealth: Payer: Self-pay

## 2020-11-24 VITALS — Temp 99.9°F | Wt 183.0 lb

## 2020-11-24 DIAGNOSIS — J029 Acute pharyngitis, unspecified: Secondary | ICD-10-CM

## 2020-11-24 DIAGNOSIS — R059 Cough, unspecified: Secondary | ICD-10-CM | POA: Diagnosis not present

## 2020-11-24 DIAGNOSIS — R0981 Nasal congestion: Secondary | ICD-10-CM

## 2020-11-24 DIAGNOSIS — J3489 Other specified disorders of nose and nasal sinuses: Secondary | ICD-10-CM | POA: Diagnosis not present

## 2020-11-24 MED ORDER — PROMETHAZINE-DM 6.25-15 MG/5ML PO SYRP: 5.0000 mL | ORAL_SOLUTION | Freq: Every evening | ORAL | 0 refills | Status: DC | PRN

## 2020-11-24 MED ORDER — BENZONATATE 200 MG PO CAPS: 200.0000 mg | ORAL_CAPSULE | Freq: Two times a day (BID) | ORAL | 0 refills | Status: DC | PRN

## 2020-11-24 NOTE — Telephone Encounter (Signed)
Pt. Called stating that the benzonatate you called in for her is not covered by her ins. If you could call in something else for her.

## 2020-11-24 NOTE — Addendum Note (Signed)
Addended by: Avanell Shackleton on: 11/24/2020 12:28 PM   Modules accepted: Orders

## 2020-11-24 NOTE — Telephone Encounter (Signed)
I will send in promethazine DM but let her know this can be sedating.

## 2020-11-24 NOTE — Progress Notes (Signed)
   Subjective:  Documentation for virtual telephone encounter.  The patient was located at home. The provider was located in the office. The patient did consent to this visit and is aware of possible charges through their insurance for this visit.  The other persons participating in this telemedicine service were none. Time spent on call was 11 minutes and in review of previous records 13 minutes total.  This virtual service is not related to other E/M service within previous 7 days.  67619 (5-16min) 50932 (11-17min) [id number] (21-46min)    Patient ID: Rebecca Andrade, female    DOB: 10/03/1971, 50 y.o.   MRN: [id number]  HPI Chief Complaint  Patient presents with  . possible covid    Runny nose, temp 99.9, cough, symptoms- started a few days ago.    Complains of a 4 day history of rhinorrhea, cough and sore throat.   States she did have fever and chills and these have improved.   Denies chest pain, palpitations, shortness of breath, abdominal pain, nausea, vomiting or diarrhea.  Taking ibuprofen, Nyquil, and cough lozenges.  She is also taking a multivitamin.  She did have 2 Covid vaccines.   Her brother passed away 2 weeks ago and she was exposed to someone with a positive COVID test.   Review of Systems Pertinent positives and negatives in the history of present illness.     Objective:   Physical Exam Temp 99.9 F (37.7 C)   Wt 183 lb (83 kg)   BMI 28.66 kg/m   Alert and oriented in no acute distress.  Speaking in complete sentences without difficulty.  She is coughing during the visit.      Assessment & Plan:  Nasal congestion with rhinorrhea - Plan: POC COVID-19 BinaxNow, Novel Coronavirus, NAA ([facility])  Cough - Plan: benzonatate (TESSALON) 200 MG capsule, POC COVID-19 BinaxNow, Novel Coronavirus, NAA ([facility])  Acute pharyngitis, unspecified etiology - Plan: POC COVID-19 BinaxNow, Novel Coronavirus, NAA ([facility])  Discussed coming to our parking lot for  COVID testing.  States she cannot come until tomorrow does not have a ride.  I will put her on the schedule tomorrow morning for rapid and PCR COVID test.  She will quarantine in the meantime.  Discussed supportive care.  Follow-up pending results.

## 2020-11-24 NOTE — Telephone Encounter (Signed)
Pt was notified.  

## 2020-11-25 ENCOUNTER — Other Ambulatory Visit: Payer: Medicare HMO

## 2020-11-26 ENCOUNTER — Other Ambulatory Visit (INDEPENDENT_AMBULATORY_CARE_PROVIDER_SITE_OTHER): Payer: Medicare HMO

## 2020-11-26 ENCOUNTER — Telehealth: Payer: Self-pay

## 2020-11-26 DIAGNOSIS — R0981 Nasal congestion: Secondary | ICD-10-CM | POA: Diagnosis not present

## 2020-11-26 DIAGNOSIS — J3489 Other specified disorders of nose and nasal sinuses: Secondary | ICD-10-CM

## 2020-11-26 DIAGNOSIS — R059 Cough, unspecified: Secondary | ICD-10-CM

## 2020-11-26 DIAGNOSIS — J029 Acute pharyngitis, unspecified: Secondary | ICD-10-CM

## 2020-11-26 LAB — POC COVID19 BINAXNOW: SARS Coronavirus 2 Ag: POSITIVE — AB

## 2020-11-26 NOTE — Progress Notes (Signed)
Please let her know that her rapid COVID test was positive so she does have a COVID infection.  She needs to continue quarantining and treating her symptoms.  If she is getting significantly worse, let me know or go to an urgent care or the emergency department if she is having chest pain or shortness of breath.  If she still has symptoms after 5 days from her initial symptoms, she will need to quarantine for 10 full days.

## 2020-11-26 NOTE — Telephone Encounter (Signed)
Pt rapid was positive. PCR was canceled. KH

## 2020-12-01 ENCOUNTER — Other Ambulatory Visit (HOSPITAL_COMMUNITY): Payer: Self-pay | Admitting: Psychiatry

## 2020-12-01 DIAGNOSIS — F4010 Social phobia, unspecified: Secondary | ICD-10-CM

## 2020-12-01 DIAGNOSIS — F332 Major depressive disorder, recurrent severe without psychotic features: Secondary | ICD-10-CM

## 2020-12-08 ENCOUNTER — Ambulatory Visit: Payer: Medicare HMO

## 2020-12-14 ENCOUNTER — Telehealth (HOSPITAL_COMMUNITY): Payer: Self-pay | Admitting: *Deleted

## 2020-12-14 NOTE — Telephone Encounter (Signed)
Pt called requesting a refill of the Adderall. Pt next appointment on 02/07/21.

## 2020-12-15 ENCOUNTER — Telehealth (HOSPITAL_COMMUNITY): Payer: Self-pay | Admitting: *Deleted

## 2020-12-15 DIAGNOSIS — F900 Attention-deficit hyperactivity disorder, predominantly inattentive type: Secondary | ICD-10-CM

## 2020-12-15 MED ORDER — AMPHETAMINE-DEXTROAMPHETAMINE 10 MG PO TABS: ORAL_TABLET | ORAL | 0 refills | Status: DC

## 2020-12-15 NOTE — Telephone Encounter (Signed)
Done but She is not due until 12/20/2020

## 2020-12-15 NOTE — Telephone Encounter (Signed)
Yes I see that, she wants to make sure we can get the PA done ASAP.

## 2020-12-15 NOTE — Addendum Note (Signed)
Addended by: Kathryne Sharper T on: 12/15/2020 08:44 AM   Modules accepted: Orders

## 2020-12-15 NOTE — Telephone Encounter (Signed)
Pt has called numerous times requesting a refill of the Adderall. Needs new Rx so we can do PA she says.

## 2020-12-20 ENCOUNTER — Telehealth (HOSPITAL_COMMUNITY): Payer: Self-pay | Admitting: *Deleted

## 2020-12-20 NOTE — Telephone Encounter (Signed)
PA for Adderall IR 10 mg approved by [facility]. Authorization valid thru 11/05/21. Pt has been made aware.

## 2020-12-25 ENCOUNTER — Other Ambulatory Visit: Payer: Self-pay | Admitting: Family Medicine

## 2020-12-25 ENCOUNTER — Other Ambulatory Visit: Payer: Self-pay | Admitting: Neurology

## 2020-12-27 NOTE — Telephone Encounter (Signed)
Too soon to refill.

## 2021-01-01 ENCOUNTER — Other Ambulatory Visit (HOSPITAL_COMMUNITY): Payer: Self-pay | Admitting: Psychiatry

## 2021-01-01 DIAGNOSIS — F4010 Social phobia, unspecified: Secondary | ICD-10-CM

## 2021-01-07 ENCOUNTER — Other Ambulatory Visit: Payer: Self-pay | Admitting: Neurology

## 2021-01-07 ENCOUNTER — Other Ambulatory Visit (HOSPITAL_COMMUNITY): Payer: Self-pay | Admitting: Psychiatry

## 2021-01-07 DIAGNOSIS — F332 Major depressive disorder, recurrent severe without psychotic features: Secondary | ICD-10-CM

## 2021-01-17 ENCOUNTER — Other Ambulatory Visit (HOSPITAL_COMMUNITY): Payer: Self-pay | Admitting: Psychiatry

## 2021-01-17 ENCOUNTER — Telehealth (HOSPITAL_COMMUNITY): Payer: Self-pay | Admitting: *Deleted

## 2021-01-17 DIAGNOSIS — F900 Attention-deficit hyperactivity disorder, predominantly inattentive type: Secondary | ICD-10-CM

## 2021-01-17 DIAGNOSIS — F332 Major depressive disorder, recurrent severe without psychotic features: Secondary | ICD-10-CM

## 2021-01-17 MED ORDER — AMPHETAMINE-DEXTROAMPHETAMINE 10 MG PO TABS: ORAL_TABLET | ORAL | 0 refills | Status: DC

## 2021-01-17 NOTE — Telephone Encounter (Signed)
Send to [facility].

## 2021-01-17 NOTE — Telephone Encounter (Signed)
Pt called requesting a refill of the Adderall. Pt has an upcoming appointment on 02/07/21. Thanks.

## 2021-01-19 ENCOUNTER — Ambulatory Visit: Payer: Medicare HMO | Admitting: Family Medicine

## 2021-01-20 ENCOUNTER — Encounter: Payer: Self-pay | Admitting: Neurology

## 2021-01-20 ENCOUNTER — Ambulatory Visit: Payer: Medicare HMO | Admitting: Neurology

## 2021-01-20 VITALS — BP 114/68 | HR 83 | Ht 67.0 in | Wt 181.2 lb

## 2021-01-20 DIAGNOSIS — G43019 Migraine without aura, intractable, without status migrainosus: Secondary | ICD-10-CM

## 2021-01-20 MED ORDER — KETOROLAC TROMETHAMINE 10 MG PO TABS: 10.0000 mg | ORAL_TABLET | Freq: Four times a day (QID) | ORAL | 3 refills | Status: DC | PRN

## 2021-01-20 MED ORDER — POTASSIUM CITRATE ER 15 MEQ (1620 MG) PO TBCR: 1.0000 | EXTENDED_RELEASE_TABLET | Freq: Two times a day (BID) | ORAL | Status: DC

## 2021-01-20 MED ORDER — BACLOFEN 10 MG PO TABS
10.0000 mg | ORAL_TABLET | Freq: Two times a day (BID) | ORAL | 5 refills | Status: DC
Start: 2021-01-20 — End: 2021-05-06

## 2021-01-20 MED ORDER — ONDANSETRON HCL 4 MG PO TABS: 4.0000 mg | ORAL_TABLET | Freq: Three times a day (TID) | ORAL | 3 refills | Status: DC | PRN

## 2021-01-20 MED ORDER — MECLIZINE HCL 25 MG PO TABS: 25.0000 mg | ORAL_TABLET | Freq: Two times a day (BID) | ORAL | 5 refills | Status: DC | PRN

## 2021-01-20 MED ORDER — NURTEC 75 MG PO TBDP: 75.0000 mg | ORAL_TABLET | ORAL | 3 refills | Status: DC
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
## 2021-01-20 NOTE — Patient Instructions (Signed)
We will go up on the baclofen to 10 mg twice a day.   We will start Nurtec 75 mg every other day for headache prevention.

## 2021-01-21 ENCOUNTER — Ambulatory Visit: Payer: Medicare HMO

## 2021-01-21 NOTE — Telephone Encounter (Signed)
Opened in error

## 2021-02-07 ENCOUNTER — Encounter (HOSPITAL_COMMUNITY): Payer: Self-pay | Admitting: Psychiatry

## 2021-02-07 ENCOUNTER — Telehealth (INDEPENDENT_AMBULATORY_CARE_PROVIDER_SITE_OTHER): Payer: Medicare HMO | Admitting: Psychiatry

## 2021-02-07 ENCOUNTER — Other Ambulatory Visit: Payer: Self-pay

## 2021-02-07 VITALS — Wt 182.0 lb

## 2021-02-07 DIAGNOSIS — F332 Major depressive disorder, recurrent severe without psychotic features: Secondary | ICD-10-CM

## 2021-02-07 DIAGNOSIS — F900 Attention-deficit hyperactivity disorder, predominantly inattentive type: Secondary | ICD-10-CM

## 2021-02-07 DIAGNOSIS — F4321 Adjustment disorder with depressed mood: Secondary | ICD-10-CM

## 2021-02-07 DIAGNOSIS — F4010 Social phobia, unspecified: Secondary | ICD-10-CM

## 2021-02-07 MED ORDER — AMPHETAMINE-DEXTROAMPHETAMINE 10 MG PO TABS: ORAL_TABLET | ORAL | 0 refills | Status: DC

## 2021-02-07 MED ORDER — FLUOXETINE HCL 40 MG PO CAPS: 40.0000 mg | ORAL_CAPSULE | Freq: Every day | ORAL | 1 refills | Status: DC

## 2021-02-07 MED ORDER — QUETIAPINE FUMARATE 100 MG PO TABS: 100.0000 mg | ORAL_TABLET | Freq: Every day | ORAL | 1 refills | Status: DC

## 2021-02-07 MED ORDER — VENLAFAXINE HCL ER 150 MG PO CP24: 150.0000 mg | ORAL_CAPSULE | Freq: Every day | ORAL | 1 refills | Status: DC

## 2021-02-07 NOTE — Progress Notes (Signed)
Virtual Visit via Telephone Note  I connected with Rebecca Andrade on 02/07/21 at  8:20 AM EDT by telephone and verified that I am speaking with the correct person using two identifiers.  Location: Patient: Home Provider: Home Office   I discussed the limitations, risks, security and privacy concerns of performing an evaluation and management service by telephone and the availability of in person appointments. I also discussed with the patient that there may be a patient responsible charge related to this service. The patient expressed understanding and agreed to proceed.   History of Present Illness: Patient is evaluated on the phone.  She admitted increased anxiety and feeling nervous because now she has to move before the end of this month.  Patient told she was renting the place and the owner decided to sell the house and she has only few days left.  She is desperately looking for a new place and it has been hard.  She endorsed lately more crying spells, feeling sad and overwhelmed.  She has few hours of sleep because she is thinking about future.  She also going through grief about her oldest brother who died in 2023/10/23 due to brain tumor.  She had a support from her other brother who lives with him and her son who also checks on her regularly.  She still feels anxious and nervous when she go outside but she feels the medicine helping.  Her appetite is okay.  Her weight is stable.  Her attention, focus is better with the Adderall.  Recently she had a visit with his neurologist because of migraine headaches.  Her medicines is adjusted and she is feeling better.  Patient denies any tremors, shakes or any EPS.   Past Psychiatric History:Reviewed. H/Otaking antidepressants since 1990. H/Oinpatient twice,onein 2005 andin2014.H/Ococaine use,paranoia, hallucination, mood swing, anger and mania.No h/osuicidal attempt.TriedZoloft, Paxil, Lexapro, Wellbutrin, lithium, Remeron, Topamax,  Abilify, Pristiq, Effexor, amitriptyline, Xanax, temazepam, Valium and Cymbalta. PCPprscribed Adderall for ADD.  Psychiatric Specialty Exam: Physical Exam  Review of Systems  Weight 182 lb (82.6 kg).There is no height or weight on file to calculate BMI.  General Appearance: NA  Eye Contact:  NA  Speech:  Slow  Volume:  Decreased  Mood:  Anxious, Depressed and Dysphoric  Affect:  NA  Thought Process:  Descriptions of Associations: Intact  Orientation:  Full (Time, Place, and Person)  Thought Content:  Rumination  Suicidal Thoughts:  No  Homicidal Thoughts:  No  Memory:  Immediate;   Good Recent;   Fair Remote;   Fair  Judgement:  Fair  Insight:  Present  Psychomotor Activity:  NA  Concentration:  Concentration: Fair and Attention Span: Fair  Recall:  [employer] of Knowledge:  Fair  Language:  Good  Akathisia:  No  Handed:  Right  AIMS (if indicated):     Assets:  Communication Skills Desire for Improvement Resilience Social Support  ADL's:  Intact  Cognition:  WNL  Sleep:   fair      Assessment and Plan: Major depressive disorder, recurrent.  Social anxiety disorder.  Attention deficit disorder, inattentive type.  Grief.  Patient is more stressed because her living situation and she has to find a new place before the end of this month.  She also going through grief and we have recommended therapy but patient could not afford at this time.  I recommend to contact hospice for therapy and patient promised that she will call.  We talked about increasing the venlafaxine  and she agreed to give a try.  She is already taking Prozac and BuSpar and I explained some time combination of these medicine can cause tremors and she need to watch carefully.  She promised to monitor the medication side effects.  Continue Prozac 40 mg daily, Adderall 10 mg to take 2 tablet in the morning and 2 in the afternoon, Seroquel 100 mg at bedtime BuSpar 30 mg twice a day and I will increase  venlafaxine to 150 mg in the morning.  Encouraged to call us back if she has any question or any concern.  Follow-up in 4 to 5 weeks.  Follow Up Instructions:    I discussed the assessment and treatment plan with the patient. The patient was provided an opportunity to ask questions and all were answered. The patient agreed with the plan and demonstrated an understanding of the instructions.   The patient was advised to call back or seek an in-person evaluation if the symptoms worsen or if the condition fails to improve as anticipated.  I provided 17 minutes of non-face-to-face time during this encounter.   Cleotis Nipper, MD

## 2021-02-10 ENCOUNTER — Encounter (HOSPITAL_COMMUNITY): Payer: Self-pay

## 2021-02-14 ENCOUNTER — Telehealth: Payer: Self-pay | Admitting: Family Medicine

## 2021-02-14 MED ORDER — LINACLOTIDE 290 MCG PO CAPS: ORAL_CAPSULE | ORAL | 0 refills | Status: DC

## 2021-02-14 NOTE — Telephone Encounter (Signed)
done

## 2021-02-14 NOTE — Telephone Encounter (Signed)
Pt called requested refill on [facility]   [facility]

## 2021-02-14 NOTE — Telephone Encounter (Signed)
ok 

## 2021-02-21 ENCOUNTER — Other Ambulatory Visit (HOSPITAL_COMMUNITY): Payer: Self-pay | Admitting: Psychiatry

## 2021-02-21 DIAGNOSIS — F332 Major depressive disorder, recurrent severe without psychotic features: Secondary | ICD-10-CM

## 2021-02-21 DIAGNOSIS — F4010 Social phobia, unspecified: Secondary | ICD-10-CM

## 2021-02-27 ENCOUNTER — Other Ambulatory Visit (HOSPITAL_COMMUNITY): Payer: Self-pay | Admitting: Psychiatry

## 2021-02-27 DIAGNOSIS — F4010 Social phobia, unspecified: Secondary | ICD-10-CM

## 2021-02-27 DIAGNOSIS — F332 Major depressive disorder, recurrent severe without psychotic features: Secondary | ICD-10-CM

## 2021-02-28 ENCOUNTER — Other Ambulatory Visit (HOSPITAL_COMMUNITY): Payer: Self-pay | Admitting: *Deleted

## 2021-02-28 DIAGNOSIS — F4010 Social phobia, unspecified: Secondary | ICD-10-CM

## 2021-02-28 DIAGNOSIS — F332 Major depressive disorder, recurrent severe without psychotic features: Secondary | ICD-10-CM

## 2021-02-28 MED ORDER — BUSPIRONE HCL 30 MG PO TABS: 30.0000 mg | ORAL_TABLET | Freq: Two times a day (BID) | ORAL | 0 refills | Status: DC

## 2021-03-02 ENCOUNTER — Other Ambulatory Visit (HOSPITAL_COMMUNITY): Payer: Self-pay | Admitting: Psychiatry

## 2021-03-02 DIAGNOSIS — F4010 Social phobia, unspecified: Secondary | ICD-10-CM

## 2021-03-02 DIAGNOSIS — F332 Major depressive disorder, recurrent severe without psychotic features: Secondary | ICD-10-CM

## 2021-03-14 ENCOUNTER — Other Ambulatory Visit: Payer: Self-pay

## 2021-03-14 ENCOUNTER — Telehealth (INDEPENDENT_AMBULATORY_CARE_PROVIDER_SITE_OTHER): Payer: Medicare HMO | Admitting: Psychiatry

## 2021-03-14 ENCOUNTER — Encounter (HOSPITAL_COMMUNITY): Payer: Self-pay | Admitting: Psychiatry

## 2021-03-14 VITALS — Wt 182.0 lb

## 2021-03-14 DIAGNOSIS — F4321 Adjustment disorder with depressed mood: Secondary | ICD-10-CM

## 2021-03-14 DIAGNOSIS — F332 Major depressive disorder, recurrent severe without psychotic features: Secondary | ICD-10-CM | POA: Diagnosis not present

## 2021-03-14 DIAGNOSIS — F4010 Social phobia, unspecified: Secondary | ICD-10-CM | POA: Diagnosis not present

## 2021-03-14 DIAGNOSIS — F900 Attention-deficit hyperactivity disorder, predominantly inattentive type: Secondary | ICD-10-CM | POA: Diagnosis not present

## 2021-03-14 MED ORDER — BUSPIRONE HCL 30 MG PO TABS: 30.0000 mg | ORAL_TABLET | Freq: Two times a day (BID) | ORAL | 2 refills | Status: DC

## 2021-03-14 MED ORDER — VENLAFAXINE HCL ER 150 MG PO CP24: 150.0000 mg | ORAL_CAPSULE | Freq: Every day | ORAL | 2 refills | Status: DC

## 2021-03-14 MED ORDER — AMPHETAMINE-DEXTROAMPHETAMINE 10 MG PO TABS: ORAL_TABLET | ORAL | 0 refills | Status: DC

## 2021-03-14 MED ORDER — QUETIAPINE FUMARATE 100 MG PO TABS: 100.0000 mg | ORAL_TABLET | Freq: Every day | ORAL | 2 refills | Status: DC

## 2021-03-14 MED ORDER — FLUOXETINE HCL 40 MG PO CAPS: 40.0000 mg | ORAL_CAPSULE | Freq: Every day | ORAL | 2 refills | Status: DC

## 2021-03-14 NOTE — Progress Notes (Signed)
Virtual Visit via Telephone Note  I connected with Rebecca Andrade on 03/14/21 at  9:20 AM EDT by telephone and verified that I am speaking with the correct person using two identifiers.  Location: Patient: [facility] Provider: Home Office   I discussed the limitations, risks, security and privacy concerns of performing an evaluation and management service by telephone and the availability of in person appointments. I also discussed with the patient that there may be a patient responsible charge related to this service. The patient expressed understanding and agreed to proceed.   History of Present Illness: Patient is evaluated by phone session.  She moved to her family friend's house on [address].  She had some relief for the still she needs to find place for herself.  She was happy as son has a cookout on Mother's Day.  She enjoyed spending time with him.  We have increased venlafaxine on the last visit and that helped her anxiety, depression and crying spells.  She is not having as many crying spells and she is sleeping better but is still struggle with grief as recently her brother died due to brain cancer.  We have recommended therapy from hospice but she forgot to call them.  Patient told the people at the place where she is living are very supportive.  She has few months to find the place.  She had applied and hoping a good outcome.  She is tolerating higher dose of venlafaxine without side effects.  She has no tremors, shakes or any EPS.  Her social anxiety is still sometimes challenging as she does not like going into public places but she feel it is manageable.  She started walking her dog regularly and she feels it gives some energy.  She has on and off migraine headaches for that she takes medication.  Her appetite is okay.  Her weight is unchanged from the past.  She has no rash or any itching.  Her son checks on her on a regular basis.  She has a younger brother who occasionally call her.   Patient denies drinking or using any illegal substances.  She is taking Adderall that helps her ADD symptoms.  She denies drinking or using any illegal substances.  Past Psychiatric History:Reviewed. H/Otaking antidepressants since 1990. H/Oinpatient twice,onein 2005 andin2014.H/Ococaine use,paranoia, hallucination, mood swing, anger and mania.No h/osuicidal attempt.TriedZoloft, Paxil, Lexapro, Wellbutrin, lithium, Remeron, Topamax, Abilify, Pristiq, Effexor, amitriptyline, Xanax, temazepam, Valium and Cymbalta. PCPprscribed Adderall for ADD.   Psychiatric Specialty Exam: Physical Exam  Review of Systems  Weight 182 lb (82.6 kg).Body mass index is 28.51 kg/m.  General Appearance: NA  Eye Contact:  NA  Speech:  Slow  Volume:  Decreased  Mood:  Anxious  Affect:  NA  Thought Process:  Descriptions of Associations: Intact  Orientation:  Full (Time, Place, and Person)  Thought Content:  Rumination  Suicidal Thoughts:  No  Homicidal Thoughts:  No  Memory:  Immediate;   Fair Recent;   Fair Remote;   Fair  Judgement:  Intact  Insight:  Present  Psychomotor Activity:  NA  Concentration:  Concentration: Fair and Attention Span: Fair  Recall:  [employer] of Knowledge:  Fair  Language:  Fair  Akathisia:  No  Handed:  Right  AIMS (if indicated):     Assets:  Communication Skills Desire for Improvement Social Support  ADL's:  Intact  Cognition:  WNL  Sleep:   ok     Assessment and Plan: Major  depressive disorder, recurrent.  Social anxiety disorder.  Attention deficit disorder, inattentive type.  Grief.  Patient is staying with her family friend for now and had applied to have her own place.  She feels increased dose of venlafaxine help her depression and anxiety.  She is still have grief after losing her older brother due to brain tumor.  Reminded to call hospice for therapy and counseling.  Patient promised that she will call them.  She does not want to change the  medication as she feels current medicine is helping her manageable.  I will continue Prozac 40 mg daily, Adderall 10 mg to take 2 tablet in the morning and 2 in the afternoon, Seroquel 100 mg at bedtime, BuSpar 30 mg daily and venlafaxine 150 mg daily.  Discussed polypharmacy but patient comfortable with the medication and reported no concerns or side effects.  Recommended to call us back if she has any question or any concern.  Discussed safety concern that anytime having active suicidal thoughts to call 911 or go to local emergency room.  Follow-up and 3 months.   Follow Up Instructions:    I discussed the assessment and treatment plan with the patient. The patient was provided an opportunity to ask questions and all were answered. The patient agreed with the plan and demonstrated an understanding of the instructions.   The patient was advised to call back or seek an in-person evaluation if the symptoms worsen or if the condition fails to improve as anticipated.  I provided 18 minutes of non-face-to-face time during this encounter.   Cleotis Nipper, MD

## 2021-03-15 ENCOUNTER — Other Ambulatory Visit: Payer: Self-pay | Admitting: Family Medicine

## 2021-03-15 ENCOUNTER — Ambulatory Visit
Admission: RE | Admit: 2021-03-15 | Discharge: 2021-03-15 | Disposition: A | Discharge status: Home / Self Care | Payer: Medicare HMO | Source: Ambulatory Visit | Attending: Family Medicine | Admitting: Family Medicine

## 2021-03-15 ENCOUNTER — Other Ambulatory Visit: Payer: Self-pay

## 2021-03-15 DIAGNOSIS — Z1231 Encounter for screening mammogram for malignant neoplasm of breast: Secondary | ICD-10-CM

## 2021-04-06 ENCOUNTER — Other Ambulatory Visit (HOSPITAL_COMMUNITY): Payer: Self-pay | Admitting: Psychiatry

## 2021-04-06 ENCOUNTER — Other Ambulatory Visit: Payer: Self-pay | Admitting: Neurology

## 2021-04-06 ENCOUNTER — Other Ambulatory Visit: Payer: Self-pay | Admitting: Family Medicine

## 2021-04-06 DIAGNOSIS — F4010 Social phobia, unspecified: Secondary | ICD-10-CM

## 2021-04-06 DIAGNOSIS — F332 Major depressive disorder, recurrent severe without psychotic features: Secondary | ICD-10-CM

## 2021-04-08 ENCOUNTER — Ambulatory Visit: Payer: Medicare HMO | Admitting: Family Medicine

## 2021-04-18 ENCOUNTER — Telehealth (HOSPITAL_COMMUNITY): Payer: Self-pay | Admitting: *Deleted

## 2021-04-18 DIAGNOSIS — F900 Attention-deficit hyperactivity disorder, predominantly inattentive type: Secondary | ICD-10-CM

## 2021-04-18 MED ORDER — AMPHETAMINE-DEXTROAMPHETAMINE 10 MG PO TABS: ORAL_TABLET | ORAL | 0 refills | Status: DC

## 2021-04-18 NOTE — Telephone Encounter (Signed)
Done

## 2021-04-18 NOTE — Telephone Encounter (Signed)
Pt called for refill of Adderall. Pt has an appointment on 06/16/21. Please review.

## 2021-04-30 ENCOUNTER — Other Ambulatory Visit (HOSPITAL_COMMUNITY): Payer: Self-pay | Admitting: Psychiatry

## 2021-04-30 DIAGNOSIS — F332 Major depressive disorder, recurrent severe without psychotic features: Secondary | ICD-10-CM

## 2021-05-06 ENCOUNTER — Other Ambulatory Visit: Payer: Self-pay | Admitting: *Deleted

## 2021-05-06 ENCOUNTER — Other Ambulatory Visit: Payer: Self-pay | Admitting: Neurology

## 2021-05-06 ENCOUNTER — Other Ambulatory Visit (HOSPITAL_COMMUNITY): Payer: Self-pay | Admitting: Psychiatry

## 2021-05-06 DIAGNOSIS — F332 Major depressive disorder, recurrent severe without psychotic features: Secondary | ICD-10-CM

## 2021-05-06 DIAGNOSIS — F4010 Social phobia, unspecified: Secondary | ICD-10-CM

## 2021-05-06 MED ORDER — BACLOFEN 10 MG PO TABS: 10.0000 mg | ORAL_TABLET | Freq: Two times a day (BID) | ORAL | 0 refills | Status: DC

## 2021-05-12 ENCOUNTER — Ambulatory Visit: Payer: Medicare HMO | Admitting: Family Medicine

## 2021-05-18 ENCOUNTER — Telehealth (HOSPITAL_COMMUNITY): Payer: Self-pay

## 2021-05-18 DIAGNOSIS — F900 Attention-deficit hyperactivity disorder, predominantly inattentive type: Secondary | ICD-10-CM

## 2021-05-18 MED ORDER — AMPHETAMINE-DEXTROAMPHETAMINE 10 MG PO TABS: ORAL_TABLET | ORAL | 0 refills | Status: DC

## 2021-05-18 NOTE — Telephone Encounter (Signed)
This is a patient of Dr. Sheela Stack. Patient called requesting a refill on her Adderall 10mg  [facility] [address]. Patient would like to pick it up today or tomorrow at the latest. Thank you

## 2021-05-26 ENCOUNTER — Ambulatory Visit: Payer: Medicare HMO | Admitting: Neurology

## 2021-06-02 ENCOUNTER — Telehealth (INDEPENDENT_AMBULATORY_CARE_PROVIDER_SITE_OTHER): Payer: Medicare HMO | Admitting: Family Medicine

## 2021-06-02 ENCOUNTER — Other Ambulatory Visit: Payer: Self-pay

## 2021-06-02 ENCOUNTER — Encounter: Payer: Self-pay | Admitting: Family Medicine

## 2021-06-02 VITALS — Ht 67.0 in | Wt 178.0 lb

## 2021-06-02 DIAGNOSIS — R0989 Other specified symptoms and signs involving the circulatory and respiratory systems: Secondary | ICD-10-CM

## 2021-06-02 NOTE — Progress Notes (Signed)
Start time: 9:46 End time: 9:58   Virtual Visit via Telephone Note  I connected with Rebecca Andrade on 06/02/21 by telephone and verified that I am speaking with the correct person using two identifiers. She reports she got message "video invalid" and was unable to connect via video.    Location: Patient: home Provider: office   I discussed the limitations of evaluation and management by telemedicine and the availability of in person appointments. The patient expressed understanding and agreed to proceed.  History of Present Illness:  Chief Complaint  Patient presents with   Cough    VIRTUAL cough and clearing her throat a lot. Symptoms started about a week ago.    She started with some throat-clearing last week. Waited a few days and did a home COVID test which was negative. Denies runny nose, has some mild nasal congestion, stuffy nose. Denies sore throat Cough is nonproductive, sporadic. No change over the last week.  She has been around cats for the last couple of months, no other changes, no sick contacts.  Tried Robitussin DM, didn't notice much benefit.  Denies any eye symptoms.  She has had COVID shots x 3   PMH, PSH, SH reviewed  Outpatient Encounter Medications as of 06/02/2021  Medication Sig   amphetamine-dextroamphetamine (ADDERALL) 10 MG tablet Take 2 in am and 2 at noon   baclofen (LIORESAL) 10 MG tablet Take 1 tablet (10 mg total) by mouth 2 (two) times daily.   busPIRone (BUSPAR) 30 MG tablet Take 1 tablet (30 mg total) by mouth 2 (two) times daily.   FLUoxetine (PROZAC) 40 MG capsule Take 1 capsule (40 mg total) by mouth daily.   LINZESS 290 MCG CAPS capsule TAKE 1 CAPSULE BY MOUTH EVERY DAY BEFORE BREAKFAST   oxybutynin (DITROPAN) 5 MG tablet Take 5 mg by mouth daily.   Potassium Citrate 15 MEQ (1620 MG) TBCR Take 1 tablet by mouth in the morning and at bedtime.   QUEtiapine (SEROQUEL) 100 MG tablet Take 1 tablet (100 mg total) by mouth at bedtime.    Rimegepant Sulfate (NURTEC) 75 MG TBDP Take 75 mg by mouth every other day.   venlafaxine XR (EFFEXOR-XR) 150 MG 24 hr capsule Take 1 capsule (150 mg total) by mouth daily with breakfast.   ketorolac (TORADOL) 10 MG tablet Take 1 tablet (10 mg total) by mouth every 6 (six) hours as needed. (Patient not taking: Reported on 06/02/2021)   meclizine (ANTIVERT) 25 MG tablet Take 1 tablet (25 mg total) by mouth 2 (two) times daily as needed for dizziness. (Patient not taking: Reported on 06/02/2021)   ondansetron (ZOFRAN) 4 MG tablet Take 1 tablet (4 mg total) by mouth every 8 (eight) hours as needed for nausea or vomiting. (Patient not taking: Reported on 06/02/2021)   No facility-administered encounter medications on file as of 06/02/2021.   No Known Allergies  ROS:  No chest pain, shortness of breath, n/v/d, f/c, just hot flashes. No bleeding or rashes See HPI    Observations/Objective:  Ht 5\' 7"  (1.702 m)   Wt 178 lb (80.7 kg)   BMI 27.88 kg/m   Normal speech, alert. Exam limited due to virtual nature of the visit   Assessment and Plan:  Upper respiratory symptom - throat-clearing, slight cough, suspect related to some PND. Ddx of etiology reviewed--allergic, viral. Supportive measures reviewed  Supportive measures reviewed Pt not on MyChart, will mail AVS with instructions.  Follow Up Instructions:    I discussed the  assessment and treatment plan with the patient. The patient was provided an opportunity to ask questions and all were answered. The patient agreed with the plan and demonstrated an understanding of the instructions.   The patient was advised to call back or seek an in-person evaluation if the symptoms worsen or if the condition fails to improve as anticipated.  I spent 16 minutes dedicated to the care of this patient, including pre-visit review of records, face to face time, post-visit ordering of testing and documentation.    Lavonda Jumbo, MD

## 2021-06-02 NOTE — Patient Instructions (Signed)
You reported nasal congestion, throat-clearing and cough. This is likely either from allergies, or a viral infection. I recommend taking an antihistamine such as claritin or zyrtec once daily. Either continue the robitussin DM you have (and take it every 4 hours), or change to plain Mucinex 12 hour tablet, and take it twice daily. This is guaifenesin, which is an expectorant which should loosen up any mucus or phlegm, and help with symptoms. Using a nasal saline spray will also help.  You should contact us if you develop fever, discolored mucus, facial or sinus pain, worsening cough, shortness of breath, chest pain, or other new concerns.  I hope you feel better soon!

## 2021-06-16 ENCOUNTER — Telehealth (INDEPENDENT_AMBULATORY_CARE_PROVIDER_SITE_OTHER): Payer: Medicare HMO | Admitting: Psychiatry

## 2021-06-16 ENCOUNTER — Encounter (HOSPITAL_COMMUNITY): Payer: Self-pay | Admitting: Psychiatry

## 2021-06-16 ENCOUNTER — Other Ambulatory Visit: Payer: Self-pay

## 2021-06-16 VITALS — Wt 176.0 lb

## 2021-06-16 DIAGNOSIS — F4321 Adjustment disorder with depressed mood: Secondary | ICD-10-CM

## 2021-06-16 DIAGNOSIS — F332 Major depressive disorder, recurrent severe without psychotic features: Secondary | ICD-10-CM

## 2021-06-16 DIAGNOSIS — F900 Attention-deficit hyperactivity disorder, predominantly inattentive type: Secondary | ICD-10-CM

## 2021-06-16 DIAGNOSIS — F4010 Social phobia, unspecified: Secondary | ICD-10-CM | POA: Diagnosis not present

## 2021-06-16 MED ORDER — QUETIAPINE FUMARATE 100 MG PO TABS: 100.0000 mg | ORAL_TABLET | Freq: Every day | ORAL | 2 refills | Status: DC

## 2021-06-16 MED ORDER — BUSPIRONE HCL 30 MG PO TABS: 30.0000 mg | ORAL_TABLET | Freq: Two times a day (BID) | ORAL | 2 refills | Status: DC

## 2021-06-16 MED ORDER — FLUOXETINE HCL 40 MG PO CAPS: 40.0000 mg | ORAL_CAPSULE | Freq: Every day | ORAL | 2 refills | Status: DC

## 2021-06-16 MED ORDER — VENLAFAXINE HCL ER 150 MG PO CP24: 150.0000 mg | ORAL_CAPSULE | Freq: Every day | ORAL | 2 refills | Status: DC

## 2021-06-16 MED ORDER — AMPHETAMINE-DEXTROAMPHETAMINE 10 MG PO TABS: ORAL_TABLET | ORAL | 0 refills | Status: DC

## 2021-06-16 NOTE — Progress Notes (Signed)
Virtual Visit via Telephone Note  I connected with Rebecca Andrade on 06/16/21 at  2:20 PM EDT by telephone and verified that I am speaking with the correct person using two identifiers.  Location: Patient: Home Provider: Office   I discussed the limitations, risks, security and privacy concerns of performing an evaluation and management service by telephone and the availability of in person appointments. I also discussed with the patient that there may be a patient responsible charge related to this service. The patient expressed understanding and agreed to proceed.   History of Present Illness: Patient is evaluated by phone session.  She is now have her own place and she moved to [place], [place].  She has family members who lives there.  She also in contact with her mother frequently.  She is still going through grief but slowly and gradually getting better.  There are days when she thinks about her brother who died.  We have recommended grief counseling through hospice but patient had tried a few times but not able to connect with them.  Now she is thinking to go in person to talk to them.  She does not want to change the medication as she feels things are going well.  Her symptoms are manageable.  She is able to do multitasking and her attention, focus is okay.  She sleeps good.  She denies any agitation, anger, highs and lows or any suicidal thoughts.  Her appetite is okay.  She is trying to lose weight and she had lost few pounds since the last visit.  She is happy that her son is doing well and does visit frequently on the weekends.  Patient denies drinking or using any illegal substances.   Past Psychiatric History: Reviewed. H/O taking antidepressants since 1990.  H/O inpatient twice, one in 2005 and in 2014. H/O cocaine use, paranoia, hallucination, mood swing, anger and mania. No h/o suicidal attempt. Tried Zoloft, Paxil, Lexapro, Wellbutrin, lithium, Remeron, Topamax, Abilify,  Pristiq, Effexor, amitriptyline, Xanax, temazepam, Valium and Cymbalta.  PCP prscribed Adderall for ADD.     Psychiatric Specialty Exam: Physical Exam  Review of Systems  Weight 176 lb (79.8 kg).There is no height or weight on file to calculate BMI.  General Appearance: NA  Eye Contact:  NA  Speech:  Slow  Volume:  Decreased  Mood:  Dysphoric  Affect:  NA  Thought Process:  Goal Directed  Orientation:  Full (Time, Place, and Person)  Thought Content:  WDL  Suicidal Thoughts:  No  Homicidal Thoughts:  No  Memory:  Immediate;   Good Recent;   Fair Remote;   Fair  Judgement:  Intact  Insight:  Present  Psychomotor Activity:  NA  Concentration:  Concentration: Fair and Attention Span: Fair  Recall:  [employer] of Knowledge:  Fair  Language:  Good  Akathisia:  No  Handed:  Right  AIMS (if indicated):     Assets:  Communication Skills Desire for Improvement Housing Resilience Social Support Transportation  ADL's:  Intact  Cognition:  WNL  Sleep:   ok      Assessment and Plan: Major depressive disorder, recurrent.  Social anxiety disorder.  ADD, inattentive type.  Grief  Patient is slowly and gradually getting better and dealing with the grief.  She admitted that there are days when she struggle but now better than before.  She liked to call the hospice to schedule appointment for therapist for grief counseling.  Overall she feels the  medicine helping her and keeping her symptoms stable.  She like to keep the current medication.  Continue Adderall 10 mg to take 2 tablet in the morning and 2 in the afternoon, Prozac 40 mg daily, Seroquel 20 mg at bedtime, BuSpar 30 mg daily and venlafaxine 150 mg daily.  Recommended to call us back if she is any question or any concern.  I had offered therapy in our office but patient at this time cannot afford and prefer to go through hospice.  Follow-up in 3 months.  Follow Up Instructions:    I discussed the assessment and treatment  plan with the patient. The patient was provided an opportunity to ask questions and all were answered. The patient agreed with the plan and demonstrated an understanding of the instructions.   The patient was advised to call back or seek an in-person evaluation if the symptoms worsen or if the condition fails to improve as anticipated.  I provided 16 minutes of non-face-to-face time during this encounter.   Cleotis Nipper, MD

## 2021-06-19 ENCOUNTER — Other Ambulatory Visit (HOSPITAL_COMMUNITY): Payer: Self-pay | Admitting: Psychiatry

## 2021-06-19 DIAGNOSIS — F4010 Social phobia, unspecified: Secondary | ICD-10-CM

## 2021-06-19 DIAGNOSIS — F332 Major depressive disorder, recurrent severe without psychotic features: Secondary | ICD-10-CM

## 2021-06-26 ENCOUNTER — Other Ambulatory Visit (HOSPITAL_COMMUNITY): Payer: Self-pay | Admitting: Psychiatry

## 2021-06-26 DIAGNOSIS — F332 Major depressive disorder, recurrent severe without psychotic features: Secondary | ICD-10-CM

## 2021-06-26 DIAGNOSIS — F4010 Social phobia, unspecified: Secondary | ICD-10-CM

## 2021-07-18 ENCOUNTER — Telehealth (HOSPITAL_COMMUNITY): Payer: Self-pay | Admitting: *Deleted

## 2021-07-18 DIAGNOSIS — F900 Attention-deficit hyperactivity disorder, predominantly inattentive type: Secondary | ICD-10-CM

## 2021-07-18 MED ORDER — AMPHETAMINE-DEXTROAMPHETAMINE 10 MG PO TABS: ORAL_TABLET | ORAL | 0 refills | Status: DC

## 2021-07-18 NOTE — Telephone Encounter (Signed)
Done

## 2021-07-18 NOTE — Telephone Encounter (Signed)
Pt called requesting a refill of the Adderall 10 mg which she takes 2 tabs bid. Last filled 06/16/21. Pt next appointment is on 09/14/21. Please review.

## 2021-07-19 ENCOUNTER — Encounter: Payer: Self-pay | Admitting: Family Medicine

## 2021-07-19 ENCOUNTER — Other Ambulatory Visit: Payer: Self-pay

## 2021-07-19 ENCOUNTER — Ambulatory Visit (INDEPENDENT_AMBULATORY_CARE_PROVIDER_SITE_OTHER): Payer: Medicare HMO | Admitting: Family Medicine

## 2021-07-19 VITALS — BP 117/74 | HR 83 | Temp 97.9°F | Ht 67.0 in | Wt 180.2 lb

## 2021-07-19 DIAGNOSIS — I700 Atherosclerosis of aorta: Secondary | ICD-10-CM | POA: Diagnosis not present

## 2021-07-19 DIAGNOSIS — Z113 Encounter for screening for infections with a predominantly sexual mode of transmission: Secondary | ICD-10-CM

## 2021-07-19 DIAGNOSIS — K5909 Other constipation: Secondary | ICD-10-CM | POA: Diagnosis not present

## 2021-07-19 DIAGNOSIS — E7800 Pure hypercholesterolemia, unspecified: Secondary | ICD-10-CM | POA: Diagnosis not present

## 2021-07-19 DIAGNOSIS — Z1159 Encounter for screening for other viral diseases: Secondary | ICD-10-CM

## 2021-07-19 DIAGNOSIS — Z0000 Encounter for general adult medical examination without abnormal findings: Secondary | ICD-10-CM | POA: Diagnosis not present

## 2021-07-19 DIAGNOSIS — Z7185 Encounter for immunization safety counseling: Secondary | ICD-10-CM | POA: Diagnosis not present

## 2021-07-19 DIAGNOSIS — N951 Menopausal and female climacteric states: Secondary | ICD-10-CM | POA: Insufficient documentation

## 2021-07-19 NOTE — Patient Instructions (Signed)
  Ms. Treloar , Thank you for taking time to come for your Medicare Wellness Visit. I appreciate your ongoing commitment to your health goals. Please review the following plan we discussed and let me know if I can assist you in the future.   These are the goals we discussed:  Get your flu shot here or with [facility].    This is a list of the screening recommended for you and due dates:  Health Maintenance  Topic Date Due   Hepatitis C Screening: USPSTF Recommendation to screen - Ages 85-79 yo.  Never done   Pap Smear  02/14/2020   Zoster (Shingles) Vaccine (1 of 2) Never done   Flu Shot  06/06/2021   Mammogram  03/16/2023   Colon Cancer Screening  08/13/2024   Tetanus Vaccine  05/28/2028   HIV Screening  Completed   Pneumococcal Vaccination  Aged Out   HPV Vaccine  Aged Out

## 2021-07-19 NOTE — Progress Notes (Signed)
Rebecca Andrade is a 50 y.o. female who presents for annual wellness visit and follow-up on chronic medical conditions.  She has the following concerns: None today  States she is living in [place], [place] now.  She is single now, lives with her dog and happier now.  She sees her psychiatrist who is managing her ADHD and depression   LDL 129 in 2019. Aortic atherosclerosis on CT in 2021- is not on a statin.   Constipation- no longer an issue on Linzess   Hot flashes. LMP 2 years ago.      Immunization History  Administered Date(s) Administered   Influenza, Quadrivalent, Recombinant, Inj, Pf 08/16/2019   Influenza, Seasonal, Injecte, Preservative Fre 06/06/2016   Influenza,inj,Quad PF,6+ Mos 09/14/2018   Tdap 05/28/2018   Last Pap smear: 02/13/2017 and declines today  Last mammogram:03/15/2021 Last colonoscopy: 08/13/2014 Last DEXA: Never Dentist: 2-3 months ago Ophtho: q year Exercise: Mows yard  Other doctors caring for patient include: Dr. Lolly Mustache- psychiatrist Dr. Anne Hahn- neurologist [facility]ang [facility] Dr. Delton Coombes- pulmonologist  Dr. Harriette Bouillon - eye doctor [facility] dentist    Depression screen:  See questionnaire below.  Depression screen [facility] 2/9 07/19/2021 06/05/2018 05/21/2017  Decreased Interest 2 0 1  Down, Depressed, Hopeless 2 0 1  PHQ - 2 Score 4 0 2  Altered sleeping 0 - -  Tired, decreased energy 0 - -  Change in appetite 1 - -  Feeling bad or failure about yourself  2 - -  Trouble concentrating 1 - -  Moving slowly or fidgety/restless 0 - -  Suicidal thoughts 0 - -  PHQ-9 Score 8 - -  Difficult doing work/chores Somewhat difficult - -  Some encounter information is confidential and restricted. Go to Review Flowsheets activity to see all data.  Some recent data might be hidden    Fall Risk Screen: see questionnaire below. Fall Risk  07/19/2021 06/05/2018 05/21/2017  Falls in the past year? 0 No No  Number falls in past yr: 0 - -  Injury with Fall? 1 - -  Risk for fall due  to : No Fall Risks - -  Follow up Falls evaluation completed - -    ADL screen:  See questionnaire below Functional Status Survey: Is the patient deaf or have difficulty hearing?: No Does the patient have difficulty seeing, even when wearing glasses/contacts?: No Does the patient have difficulty concentrating, remembering, or making decisions?: No Does the patient have difficulty walking or climbing stairs?: No Does the patient have difficulty dressing or bathing?: No Does the patient have difficulty doing errands alone such as visiting a doctor's office or shopping?: No   End of Life Discussion:  Patient does not have a living will and medical power of attorney. She will take home the paperwork provided and fill out. Her son is her HCPOA but states she needs to document this since she is separated from her spouse. MOST form reviewed and signed.   Review of Systems Constitutional: -fever, -chills, +sweats, -unexpected weight change, -anorexia, -fatigue Allergy: -sneezing, -itching, -congestion Dermatology: denies changing moles, rash, lumps, new worrisome lesions ENT: -runny nose, -ear pain, -sore throat, -hoarseness, -sinus pain, -teeth pain, -tinnitus, -hearing loss, -epistaxis Cardiology:  -chest pain, -palpitations, -edema, -orthopnea, -paroxysmal nocturnal dyspnea Respiratory: -cough, -shortness of breath, -dyspnea on exertion, -wheezing, -hemoptysis Gastroenterology: -abdominal pain, -nausea, -vomiting, -diarrhea, -constipation, -blood in stool, -changes in bowel movement, -dysphagia Hematology: -bleeding or bruising problems Musculoskeletal: -arthralgias, -myalgias, -joint swelling, -back pain, -neck pain, -cramping, -gait  changes Ophthalmology: -vision changes, -eye redness, -itching, -discharge Urology: -dysuria, -difficulty urinating, -hematuria, -urinary frequency, -urgency, incontinence Neurology: -headache, -weakness, -tingling, -numbness, -speech abnormality, -memory loss,  -falls, -dizziness Psychology:  -depressed mood, -agitation, -sleep problems    PHYSICAL EXAM:  BP 117/74 (BP Location: Left Arm, Patient Position: Sitting, Cuff Size: Normal)   Pulse 83   Temp 97.9 F (36.6 C) (Oral)   Ht 5\' 7"  (1.702 m)   Wt 180 lb 3.2 oz (81.7 kg)   SpO2 97%   BMI 28.22 kg/m   General Appearance: Alert, cooperative, no distress, appears stated age Head: Normocephalic, without obvious abnormality, atraumatic Eyes: PERRL, conjunctiva/corneas clear, EOM's intact Ears: Normal TM's and external ear canals Nose: mask on  Throat: mask on  Neck: Supple, no lymphadenopathy; thyroid: no enlargement/tenderness/nodules Back: Spine nontender, no curvature, ROM normal, no CVA tenderness Lungs: Clear to auscultation bilaterally without wheezes, rales or ronchi; respirations unlabored Chest Wall: No tenderness or deformity Heart: Regular rate and rhythm, S1 and S2 normal, no murmur, rub or gallop Breast Exam: declines, mammogram UTD Abdomen: Soft, non-tender, nondistended, normoactive bowel sounds Genitalia: refuses  Extremities: No clubbing, cyanosis or edema Pulses: 2+ and symmetric all extremities Skin: Skin color, texture, turgor normal, no rashes or lesions Lymph nodes: Cervical, supraclavicular, and axillary nodes normal Neurologic: CNII-XII intact, normal strength, sensation and gait; reflexes 2+ and symmetric throughout Psych: Normal mood, affect, hygiene and grooming.  ASSESSMENT/PLAN: Medicare annual wellness visit, subsequent -Denies issues with memory, ADLs, falls.  Her mood has improved since separating from her spouse and now living alone.  She continues seeing her psychiatrist.  Advance directive counseling done.  Routine general medical examination at a health care facility - Plan: CBC with Differential/Platelet, Comprehensive metabolic panel, TSH, T4, free, T3 -Preventive health care reviewed.  She is up-to-date on mammogram, colonoscopy but overdue on  Pap smear.  Declines Pap smear today.  Counseled on healthy lifestyle including diet and exercise.  Recommend regular dental and eye exams.  Immunizations reviewed.  Discussed safety and health promotion.  Aortic atherosclerosis (HCC) - Plan: Lipid panel -Reviewed CT result from 2021 and discussed atherosclerosis and the potential long-term health consequences associated with this.  She is willing to start on a statin.  I will check fasting lipids and send medication to her pharmacy.  Pure hypercholesterolemia - Plan: Lipid panel -Check fasting lipid panel and send statin to pharmacy.  Follow-up in 6 weeks for repeat fasting lipids.  Hot flashes, menopausal - Plan: TSH, T4, free, T3 -Check labs and follow-up.  Chronic constipation - Plan: TSH, T4, free, T3 -Controlled with Linzess.  Need for hepatitis C screening test - Plan: Hepatitis C antibody -Done per screening guidelines  Screen for STD (sexually transmitted disease) - Plan: RPR, HIV Antibody (routine testing w rflx), Hepatitis C antibody -Done per patient request.  Asymptomatic.  Immunization counseling -She is interested in hearing from [facility] for the flu shot.  If she decides to not get her flu shot there, she may return here.  Discussed monthly self breast exams and yearly mammograms; at least 30 minutes of aerobic activity at least 5 days/week and weight-bearing exercise 2x/week; proper sunscreen use reviewed; healthy diet, including goals of calcium and vitamin D intake and alcohol recommendations (less than or equal to 1 drink/day) reviewed; regular seatbelt use; changing batteries in smoke detectors.  Immunization recommendations discussed.  Colonoscopy recommendations reviewed   Medicare Attestation I have personally reviewed: The patient's medical and social history Their use of alcohol,  tobacco or illicit drugs Their current medications and supplements The patient's functional ability including ADLs,fall risks,  home safety risks, cognitive, and hearing and visual impairment Diet and physical activities Evidence for depression or mood disorders  The patient's weight, height, and BMI have been recorded in the chart.  I have made referrals, counseling, and provided education to the patient based on review of the above and I have provided the patient with a written personalized care plan for preventive services.     Hetty Blend, NP-C   07/19/2021

## 2021-07-20 ENCOUNTER — Other Ambulatory Visit: Payer: Self-pay

## 2021-07-20 ENCOUNTER — Other Ambulatory Visit: Payer: Self-pay | Admitting: Family Medicine

## 2021-07-20 DIAGNOSIS — E7800 Pure hypercholesterolemia, unspecified: Secondary | ICD-10-CM

## 2021-07-20 DIAGNOSIS — I700 Atherosclerosis of aorta: Secondary | ICD-10-CM

## 2021-07-20 LAB — COMPREHENSIVE METABOLIC PANEL
ALT: 7 IU/L (ref 0–32)
AST: 12 IU/L (ref 0–40)
Albumin/Globulin Ratio: 2 (ref 1.2–2.2)
Albumin: 4.1 g/dL (ref 3.8–4.8)
Alkaline Phosphatase: 96 IU/L (ref 44–121)
BUN/Creatinine Ratio: 16 (ref 9–23)
BUN: 14 mg/dL (ref 6–24)
Bilirubin Total: <0.2 mg/dL (ref 0.0–1.2)
CO2: 24 mmol/L (ref 20–29)
Calcium: 9.3 mg/dL (ref 8.7–10.2)
Chloride: 106 mmol/L (ref 96–106)
Creatinine, Ser: 0.87 mg/dL (ref 0.57–1.00)
Globulin, Total: 2.1 g/dL (ref 1.5–4.5)
Glucose: 92 mg/dL (ref 65–99)
Potassium: 3.9 mmol/L (ref 3.5–5.2)
Sodium: 143 mmol/L (ref 134–144)
Total Protein: 6.2 g/dL (ref 6.0–8.5)
eGFR: 81 mL/min/{1.73_m2} (ref >59)

## 2021-07-20 LAB — CBC WITH DIFFERENTIAL/PLATELET
Basophils Absolute: 0.1 10*3/uL (ref 0.0–0.2)
Basos: 1 %
EOS (ABSOLUTE): 0.2 10*3/uL (ref 0.0–0.4)
Eos: 2 %
Hematocrit: 42.1 % (ref 34.0–46.6)
Hemoglobin: 14.2 g/dL (ref 11.1–15.9)
Immature Grans (Abs): 0 10*3/uL (ref 0.0–0.1)
Immature Granulocytes: 0 %
Lymphocytes Absolute: 2.8 10*3/uL (ref 0.7–3.1)
Lymphs: 33 %
MCH: 30.2 pg (ref 26.6–33.0)
MCHC: 33.7 g/dL (ref 31.5–35.7)
MCV: 90 fL (ref 79–97)
Monocytes Absolute: 0.6 10*3/uL (ref 0.1–0.9)
Monocytes: 7 %
Neutrophils Absolute: 4.7 10*3/uL (ref 1.4–7.0)
Neutrophils: 57 %
Platelets: 258 10*3/uL (ref 150–450)
RBC: 4.7 x10E6/uL (ref 3.77–5.28)
RDW: 12 % (ref 11.7–15.4)
WBC: 8.5 10*3/uL (ref 3.4–10.8)

## 2021-07-20 LAB — LIPID PANEL
Chol/HDL Ratio: 3.1 ratio (ref 0.0–4.4)
Cholesterol, Total: 197 mg/dL (ref 100–199)
HDL: 64 mg/dL (ref >39)
LDL Chol Calc (NIH): 114 mg/dL — ABNORMAL HIGH (ref 0–99)
Triglycerides: 110 mg/dL (ref 0–149)
VLDL Cholesterol Cal: 19 mg/dL (ref 5–40)

## 2021-07-20 LAB — HEPATITIS C ANTIBODY: Hep C Virus Ab: <0.1 s/co ratio (ref 0.0–0.9)

## 2021-07-20 LAB — TSH: TSH: 0.889 u[IU]/mL (ref 0.450–4.500)

## 2021-07-20 LAB — T4, FREE: Free T4: 1.33 ng/dL (ref 0.82–1.77)

## 2021-07-20 LAB — T3: T3, Total: 83 ng/dL (ref 71–180)

## 2021-07-20 LAB — RPR: RPR Ser Ql: NONREACTIVE

## 2021-07-20 LAB — HIV ANTIBODY (ROUTINE TESTING W REFLEX): HIV Screen 4th Generation wRfx: NONREACTIVE

## 2021-07-20 MED ORDER — ATORVASTATIN CALCIUM 10 MG PO TABS: 10.0000 mg | ORAL_TABLET | Freq: Every day | ORAL | 1 refills | Status: DC

## 2021-07-20 NOTE — Progress Notes (Signed)
Her labs are quite good overall. Her LDL is slightly elevated and we talked about her starting on a cholesterol medication. I will send this to her pharmacy. She will need a 6 week lab visit to recheck fasting cholesterol. Please put in future lipids and ALT. Thanks.

## 2021-07-29 DIAGNOSIS — N83202 Unspecified ovarian cyst, left side: Secondary | ICD-10-CM | POA: Diagnosis not present

## 2021-07-29 DIAGNOSIS — S3991XA Unspecified injury of abdomen, initial encounter: Secondary | ICD-10-CM | POA: Diagnosis not present

## 2021-07-29 DIAGNOSIS — M47812 Spondylosis without myelopathy or radiculopathy, cervical region: Secondary | ICD-10-CM | POA: Diagnosis not present

## 2021-07-29 DIAGNOSIS — K449 Diaphragmatic hernia without obstruction or gangrene: Secondary | ICD-10-CM | POA: Diagnosis not present

## 2021-07-29 DIAGNOSIS — M170 Bilateral primary osteoarthritis of knee: Secondary | ICD-10-CM | POA: Diagnosis not present

## 2021-07-29 DIAGNOSIS — J984 Other disorders of lung: Secondary | ICD-10-CM | POA: Diagnosis not present

## 2021-07-29 DIAGNOSIS — S3993XA Unspecified injury of pelvis, initial encounter: Secondary | ICD-10-CM | POA: Diagnosis not present

## 2021-07-29 DIAGNOSIS — R911 Solitary pulmonary nodule: Secondary | ICD-10-CM | POA: Diagnosis not present

## 2021-07-29 DIAGNOSIS — M25572 Pain in left ankle and joints of left foot: Secondary | ICD-10-CM | POA: Diagnosis not present

## 2021-07-29 DIAGNOSIS — M19072 Primary osteoarthritis, left ankle and foot: Secondary | ICD-10-CM | POA: Diagnosis not present

## 2021-07-29 DIAGNOSIS — S161XXA Strain of muscle, fascia and tendon at neck level, initial encounter: Secondary | ICD-10-CM | POA: Diagnosis not present

## 2021-07-29 DIAGNOSIS — S299XXA Unspecified injury of thorax, initial encounter: Secondary | ICD-10-CM | POA: Diagnosis not present

## 2021-07-29 DIAGNOSIS — I2510 Atherosclerotic heart disease of native coronary artery without angina pectoris: Secondary | ICD-10-CM | POA: Diagnosis not present

## 2021-07-29 DIAGNOSIS — M542 Cervicalgia: Secondary | ICD-10-CM | POA: Diagnosis not present

## 2021-07-29 DIAGNOSIS — S0990XA Unspecified injury of head, initial encounter: Secondary | ICD-10-CM | POA: Diagnosis not present

## 2021-07-29 DIAGNOSIS — S93402A Sprain of unspecified ligament of left ankle, initial encounter: Secondary | ICD-10-CM | POA: Diagnosis not present

## 2021-07-29 DIAGNOSIS — Z041 Encounter for examination and observation following transport accident: Secondary | ICD-10-CM | POA: Diagnosis not present

## 2021-07-31 ENCOUNTER — Other Ambulatory Visit: Payer: Self-pay | Admitting: Family Medicine

## 2021-08-01 ENCOUNTER — Telehealth: Payer: Self-pay | Admitting: Family Medicine
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
## 2021-08-02 ENCOUNTER — Other Ambulatory Visit: Payer: Self-pay

## 2021-08-02 ENCOUNTER — Encounter: Payer: Self-pay | Admitting: Medical

## 2021-08-02 ENCOUNTER — Ambulatory Visit (INDEPENDENT_AMBULATORY_CARE_PROVIDER_SITE_OTHER): Payer: Medicare HMO | Admitting: Medical

## 2021-08-02 DIAGNOSIS — R0789 Other chest pain: Secondary | ICD-10-CM | POA: Insufficient documentation

## 2021-08-02 DIAGNOSIS — R4020 Unspecified coma: Secondary | ICD-10-CM | POA: Insufficient documentation

## 2021-08-02 DIAGNOSIS — M25561 Pain in right knee: Secondary | ICD-10-CM

## 2021-08-02 DIAGNOSIS — S060X9D Concussion with loss of consciousness of unspecified duration, subsequent encounter: Secondary | ICD-10-CM | POA: Diagnosis not present

## 2021-08-02 DIAGNOSIS — R519 Headache, unspecified: Secondary | ICD-10-CM | POA: Insufficient documentation

## 2021-08-02 DIAGNOSIS — M25572 Pain in left ankle and joints of left foot: Secondary | ICD-10-CM | POA: Insufficient documentation

## 2021-08-02 DIAGNOSIS — M25562 Pain in left knee: Secondary | ICD-10-CM | POA: Insufficient documentation

## 2021-08-02 DIAGNOSIS — S060X9A Concussion with loss of consciousness of unspecified duration, initial encounter: Secondary | ICD-10-CM | POA: Insufficient documentation

## 2021-08-02 DIAGNOSIS — M542 Cervicalgia: Secondary | ICD-10-CM | POA: Diagnosis not present

## 2021-08-02 MED ORDER — DIAZEPAM 5 MG PO TABS: 5.0000 mg | ORAL_TABLET | Freq: Two times a day (BID) | ORAL | 0 refills | Status: DC | PRN

## 2021-08-02 MED ORDER — HYDROCODONE-ACETAMINOPHEN 5-325 MG PO TABS: 1.0000 | ORAL_TABLET | Freq: Four times a day (QID) | ORAL | 0 refills | Status: DC | PRN

## 2021-08-02 NOTE — Patient Instructions (Signed)
Recommendations: For the next few days continue to rest and take it easy You can use ice water pack 20 minutes at a time for your sore areas, or you can alternate ice and heat therapy particularly with the sore neck Use complete rest until your concussion symptoms resolve including nausea, confusion, headache, irritability Temporarily use Valium for anxiety and muscle tension.  You can take this once or twice a day for the next few days.  This can cause sedation Use of Valium instead of your baclofen or Robaxin at this time Discontinue Robaxin/ methocarbamol Continue the Naprosyn medicine given by the emergency department.  This is for pain and inflammation twice daily until you run out You can use the hydrocodone Norco 1 tablet every 6-8 hours as needed for breakthrough pain or worse pain.  Use this sparingly.  This can cause sedation.  You can also use 1/2 tablet if the pain is not so bad We are making a referral to physical therapy to help with your pains and soreness Recheck in 10 to 14 days Upon recheck I need to look back at your lipase lab as it was elevated We will plan to recheck a CT lung in 1 year given the pulmonary nodule finding   Concussion, Adult A concussion is a brain injury from a hard, direct hit (trauma) to your head or body. This direct hit causes your brain to quickly shake back and forth inside your skull. A concussion may also be called a mild traumatic brain injury (TBI). Healing from this injury can take time. What are the causes? This condition is caused by: A direct hit to your head, such as: Running into a player during a game. Being hit in a fight. Hitting your head on a hard surface. A quick and sudden movement of the head or neck, such as in a car crash. What are the signs or symptoms? The signs of a concussion can be hard to notice. They may be missed by you, family members, and doctors. You may look fine on the outside but may not act or feel  normal. Physical symptoms Headaches. Being dizzy. Problems with body balance. Being sensitive to light or noise. Vomiting or feeling like you may vomit. Being tired. Problems seeing or hearing. Not sleeping or eating as you used to. Seizure. Mental and emotional symptoms Feeling grouchy (irritable). Having mood changes. Problems remembering things. Trouble focusing your mind (concentrating), organizing, or making decisions. Being slow to think, act, react, speak, or read. Feeling worried or nervous (anxious). Feeling sad (depressed). How is this treated? This condition may be treated by: Stopping sports or activity if you are injured. If you hit your head or have signs of concussion: Do not return to sports or activities the same day. Get checked by a doctor before you return to your activities. Resting your body and your mind. Being watched carefully, often at home. Medicines to help with symptoms such as: Headaches. Feeling like you may vomit. Problems with sleep. Avoiding alcohol and drugs. Being asked to go to a concussion clinic or a place to help you recover (rehabilitation center). Recovery from a concussion can take time. Return to activities only: When you are fully healed. When your doctor says it is safe. Avoid taking strong pain medicines (opioids) for a concussion. Follow these instructions at home: Activity Limit activities that need a lot of thought or focus, such as: Homework or work for your job. Watching TV. Using the computer or phone. Playing memory  games and puzzles. Rest. Rest helps your brain heal. Make sure you: Get plenty of sleep. Most adults should get 7-9 hours of sleep each night. Rest during the day. Take naps or breaks when you feel tired. Avoid activity like exercise until your doctor says its safe. Stop any activity that makes symptoms worse. Do not do activities that could cause a second concussion, such as riding a bike or playing  sports. Ask your doctor when you can return to your normal activities, such as school, work, sports, and driving. Your ability to react may be slower. Do not do these activities if you are dizzy. General instructions  Take over-the-counter and prescription medicines only as told by your doctor. Do not drink alcohol until your doctor says you can. Watch your symptoms and tell other people to do the same. Other problems can occur after a concussion. Older adults have a higher risk of serious problems. Tell your work Production designer, theatre/television/film, teachers, Tax adviser, school counselor, coach, or Event organiser about your injury and symptoms. Tell them about what you can or cannot do. Keep all follow-up visits as told by your doctor. This is important. How is this prevented? It is very important that you do not get another brain injury. In rare cases, another injury can cause brain damage that will not go away, brain swelling, or death. The risk of this is greatest in the first 7-10 days after a head injury. To avoid injuries: Stop activities that could lead to a second concussion, such as contact sports, until your doctor says it is okay. When you return to sports or activities: Do not crash into other players. This is how most concussions happen. Follow the rules. Respect other players. Do not engage in violent behavior while playing. Get regular exercise. Do strength and balance training. Wear a helmet that fits you well during sports, biking, or other activities. Helmets can help protect you from serious skull and brain injuries, but they do not protect you from a concussion. Even when wearing a helmet, you should avoid being hit in the head. Contact a doctor if: Your symptoms do not get better. You have new symptoms. You have another injury. Get help right away if: You have bad headaches or your headaches get worse. You feel weak or numb in any part of your body. You feel mixed up (confused). Your balance  gets worse. You vomit often. You feel more sleepy than normal. You cannot speak well, or have slurred speech. You have a seizure. Others have trouble waking you up. You have changes in how you act. You have changes in how you see (vision). You pass out (lose consciousness). These symptoms may be an emergency. Do not wait to see if the symptoms will go away. Get medical help right away. Call your local emergency services (911 in the [place] Do not drive yourself to the hospital. Summary A concussion is a brain injury from a hard, direct hit (trauma) to your head or body. This condition is treated with rest and careful watching of symptoms. Ask your doctor when you can return to your normal activities, such as school, work, or driving. Get help right away if you have a very bad headache, feel weak in any part of your body, have a seizure, have changes in how you act or see, or if you are mixed up or more sleepy than normal. This information is not intended to replace advice given to you by your health care provider. Make sure  you discuss any questions you have with your health care provider. Document Revised: 01/06/2021 Document Reviewed: 01/06/2021 Elsevier Patient Education  2022 [employer].
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
## 2021-08-12 ENCOUNTER — Ambulatory Visit: Payer: Medicare HMO | Admitting: Family Medicine

## 2021-08-15 ENCOUNTER — Encounter (HOSPITAL_COMMUNITY): Payer: Self-pay | Admitting: Physical Therapy

## 2021-08-15 ENCOUNTER — Other Ambulatory Visit: Payer: Self-pay

## 2021-08-15 ENCOUNTER — Ambulatory Visit (HOSPITAL_COMMUNITY): Payer: Medicare HMO | Attending: Medical | Admitting: Physical Therapy

## 2021-08-15 DIAGNOSIS — R2689 Other abnormalities of gait and mobility: Secondary | ICD-10-CM | POA: Diagnosis not present

## 2021-08-15 DIAGNOSIS — R293 Abnormal posture: Secondary | ICD-10-CM | POA: Insufficient documentation

## 2021-08-15 DIAGNOSIS — M542 Cervicalgia: Secondary | ICD-10-CM | POA: Insufficient documentation

## 2021-08-15 DIAGNOSIS — M25572 Pain in left ankle and joints of left foot: Secondary | ICD-10-CM | POA: Diagnosis not present

## 2021-08-15 NOTE — Patient Instructions (Addendum)
Access Code: [email or web address] URL: [email or web address] Date: 08/15/2021 Prepared by: Georges Lynch  Exercises Supine Chin Tuck - 2-3 x daily - 7 x weekly - 1-2 sets - 10 reps - 3 second hold Supine Cervical Rotation AROM on Pillow - 2-3 x daily - 7 x weekly - 1-2 sets - 10 reps - 3 second hold Supine Scapular Retraction - 2-3 x daily - 7 x weekly - 1-2 sets - 10 reps - 3 second hold Seated Ankle Circles - 2-3 x daily - 7 x weekly - 2 sets - 10 reps
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
## 2021-08-16 ENCOUNTER — Telehealth (HOSPITAL_COMMUNITY): Payer: Self-pay

## 2021-08-16 DIAGNOSIS — F900 Attention-deficit hyperactivity disorder, predominantly inattentive type: Secondary | ICD-10-CM

## 2021-08-16 MED ORDER — AMPHETAMINE-DEXTROAMPHETAMINE 10 MG PO TABS: ORAL_TABLET | ORAL | 0 refills | Status: DC

## 2021-08-16 NOTE — Telephone Encounter (Signed)
Send to [facility]. She can pick up on her due date.

## 2021-08-16 NOTE — Telephone Encounter (Addendum)
Patient called requesting a refill on her Adderall 10mg . She has a new pharmacy. Please send medication in to [facility] on [address] Please review and advise. Thank you  NOTIFIED PATIENT

## 2021-08-22 ENCOUNTER — Ambulatory Visit (HOSPITAL_COMMUNITY): Payer: Medicare HMO

## 2021-08-22 ENCOUNTER — Encounter (HOSPITAL_COMMUNITY): Payer: Self-pay

## 2021-08-22 ENCOUNTER — Ambulatory Visit: Payer: Medicare HMO | Admitting: Medical

## 2021-08-22 ENCOUNTER — Other Ambulatory Visit: Payer: Self-pay

## 2021-08-22 DIAGNOSIS — M25572 Pain in left ankle and joints of left foot: Secondary | ICD-10-CM

## 2021-08-22 DIAGNOSIS — R293 Abnormal posture: Secondary | ICD-10-CM | POA: Diagnosis not present

## 2021-08-22 DIAGNOSIS — R2689 Other abnormalities of gait and mobility: Secondary | ICD-10-CM

## 2021-08-22 DIAGNOSIS — M542 Cervicalgia: Secondary | ICD-10-CM | POA: Diagnosis not present
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
## 2021-08-22 NOTE — Patient Instructions (Addendum)
Ankle Alphabet    Using left ankle and foot only, trace the letters of the alphabet. Perform A to Z. Repeat _1 round through ser set. Do _1__ sets per session. Do _1_ sessions per day.  http://orth.exer.us/17   Copyright  VHI. All rights reserved.   Extension: Isometric (Supine / Sitting)    Lying down, tuck chin, then push head back into pillow. Hold for 5 seconds, do 10-20 repetitions. Do 1 time per day.    Copyright  VHI. All rights reserved.   Flexibility: Upper Trapezius Stretch    Gently grasp right side of head while reaching behind back with other hand. Tilt head away until a gentle stretch is felt. Hold _30_ seconds. Repeat _2-3___ times per set. Do _1___ sets per session. Do _1___ sessions per day.  http://orth.exer.us/341   Copyright  VHI. All rights reserved.   Dorsiflexion: Resisted    Facing anchor, tubing around left foot, pull toward face.  Repeat _10-20__ times per set. Do _1___ sets per session. Do _1__ sessions per day.  http://orth.exer.us/9   Copyright  VHI. All rights reserved.   Eversion: Resisted    With right foot in tubing loop, hold tubing around other foot to resist and turn foot out. Repeat _10-20__ times per set. Do _1___ sets per session. Do _1__ sessions per day.  http://orth.exer.us/15   Copyright  VHI. All rights reserved.   Inversion: Resisted    Cross legs with right leg underneath, foot in tubing loop. Hold tubing around other foot to resist and turn foot in. Repeat 10-20____ times per set. Do _1___ sets per session. Do ___ sessions per day.  http://orth.exer.us/13   Copyright  VHI. All rights reserved.   Dorsiflexion: Resisted    Tubing anchored around bottom of mattress, tubing around left foot, pull toward face.  Repeat _10-20___ times per set. Do _1_ sets per session. Do __1_ sessions per day.  http://orth.exer.us/9   Copyright  VHI. All rights reserved.

## 2021-08-24 ENCOUNTER — Encounter: Payer: Self-pay | Admitting: Medical

## 2021-08-24 ENCOUNTER — Ambulatory Visit (INDEPENDENT_AMBULATORY_CARE_PROVIDER_SITE_OTHER): Payer: Medicare HMO | Admitting: Medical

## 2021-08-24 ENCOUNTER — Telehealth: Payer: Self-pay

## 2021-08-24 ENCOUNTER — Other Ambulatory Visit: Payer: Self-pay | Admitting: Medical

## 2021-08-24 ENCOUNTER — Other Ambulatory Visit: Payer: Self-pay

## 2021-08-24 VITALS — BP 112/80 | HR 95 | Wt 185.2 lb

## 2021-08-24 DIAGNOSIS — R0789 Other chest pain: Secondary | ICD-10-CM

## 2021-08-24 DIAGNOSIS — M25561 Pain in right knee: Secondary | ICD-10-CM

## 2021-08-24 DIAGNOSIS — M25572 Pain in left ankle and joints of left foot: Secondary | ICD-10-CM

## 2021-08-24 DIAGNOSIS — N83202 Unspecified ovarian cyst, left side: Secondary | ICD-10-CM | POA: Diagnosis not present

## 2021-08-24 DIAGNOSIS — M542 Cervicalgia: Secondary | ICD-10-CM

## 2021-08-24 DIAGNOSIS — R911 Solitary pulmonary nodule: Secondary | ICD-10-CM | POA: Diagnosis not present

## 2021-08-24 DIAGNOSIS — M25562 Pain in left knee: Secondary | ICD-10-CM | POA: Diagnosis not present

## 2021-08-24 MED ORDER — METHOCARBAMOL 500 MG PO TABS: 500.0000 mg | ORAL_TABLET | Freq: Two times a day (BID) | ORAL | 0 refills | Status: DC | PRN

## 2021-08-24 MED ORDER — HYDROCODONE-IBUPROFEN 5-200 MG PO TABS: 1.0000 | ORAL_TABLET | Freq: Two times a day (BID) | ORAL | 0 refills | Status: DC | PRN

## 2021-08-24 MED ORDER — HYDROCODONE-ACETAMINOPHEN 5-325 MG PO TABS: 1.0000 | ORAL_TABLET | Freq: Two times a day (BID) | ORAL | 0 refills | Status: DC | PRN
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
## 2021-08-24 NOTE — Patient Instructions (Addendum)
Recommendations and summary:  Lung nodule - noted incidentally on CT scan in 07/2021.   Plan for repeat scan in 6 months.   Order is in the copmuter.  Ovarian cyst, left, noted incidentally on CT scan 07/2021.  I will refer you to gynecology  Go ahead and schedule follow up with you neurology and psychaitrist given the ongoing anxiety and headaches.  Continue with physical therapy  I prescribed short term medication today for muscle relaxer and pain, temporary use

## 2021-08-24 NOTE — Telephone Encounter (Signed)
[facility] called to let us know on 07/10/21 90 day supply was filled on Baclofen so robaxin will need PA- pharmacy is faxing this now.   Also pharmacy cannot get vicoprofen from any wholesalers

## 2021-08-25 ENCOUNTER — Telehealth: Payer: Self-pay

## 2021-08-25 NOTE — Telephone Encounter (Signed)
Patient notified without any further questions or concerns  

## 2021-08-25 NOTE — Telephone Encounter (Signed)
P.A. Awanda Mink, also P.A. Methacarbamol received, Vincenza Hews advised to cancel medication was switched

## 2021-08-26 NOTE — Telephone Encounter (Signed)
P.A. approved til 11/05/2022, left message for pt

## 2021-08-31 ENCOUNTER — Ambulatory Visit (HOSPITAL_COMMUNITY): Payer: Medicare HMO

## 2021-08-31 ENCOUNTER — Encounter (HOSPITAL_COMMUNITY): Payer: Self-pay

## 2021-08-31 ENCOUNTER — Other Ambulatory Visit: Payer: Self-pay

## 2021-08-31 DIAGNOSIS — R293 Abnormal posture: Secondary | ICD-10-CM | POA: Diagnosis not present

## 2021-08-31 DIAGNOSIS — M542 Cervicalgia: Secondary | ICD-10-CM

## 2021-08-31 DIAGNOSIS — R2689 Other abnormalities of gait and mobility: Secondary | ICD-10-CM | POA: Diagnosis not present

## 2021-08-31 DIAGNOSIS — M25572 Pain in left ankle and joints of left foot: Secondary | ICD-10-CM
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
## 2021-09-02 ENCOUNTER — Other Ambulatory Visit: Payer: Medicare HMO

## 2021-09-02 ENCOUNTER — Other Ambulatory Visit: Payer: Self-pay

## 2021-09-02 DIAGNOSIS — E7800 Pure hypercholesterolemia, unspecified: Secondary | ICD-10-CM | POA: Diagnosis not present

## 2021-09-03 LAB — COMPREHENSIVE METABOLIC PANEL
ALT: 8 IU/L (ref 0–32)
AST: 12 IU/L (ref 0–40)
Albumin/Globulin Ratio: 1.7 (ref 1.2–2.2)
Albumin: 4.5 g/dL (ref 3.8–4.8)
Alkaline Phosphatase: 129 IU/L — ABNORMAL HIGH (ref 44–121)
BUN/Creatinine Ratio: 13 (ref 9–23)
BUN: 11 mg/dL (ref 6–24)
Bilirubin Total: 0.5 mg/dL (ref 0.0–1.2)
CO2: 22 mmol/L (ref 20–29)
Calcium: 9.6 mg/dL (ref 8.7–10.2)
Chloride: 106 mmol/L (ref 96–106)
Creatinine, Ser: 0.88 mg/dL (ref 0.57–1.00)
Globulin, Total: 2.6 g/dL (ref 1.5–4.5)
Glucose: 94 mg/dL (ref 70–99)
Potassium: 4.3 mmol/L (ref 3.5–5.2)
Sodium: 143 mmol/L (ref 134–144)
Total Protein: 7.1 g/dL (ref 6.0–8.5)
eGFR: 80 mL/min/{1.73_m2} (ref >59)

## 2021-09-03 LAB — LIPID PANEL
Chol/HDL Ratio: 2.9 ratio (ref 0.0–4.4)
Cholesterol, Total: 198 mg/dL (ref 100–199)
HDL: 69 mg/dL (ref >39)
LDL Chol Calc (NIH): 116 mg/dL — ABNORMAL HIGH (ref 0–99)
Triglycerides: 70 mg/dL (ref 0–149)
VLDL Cholesterol Cal: 13 mg/dL (ref 5–40)

## 2021-09-05 ENCOUNTER — Other Ambulatory Visit: Payer: Self-pay | Admitting: Medical

## 2021-09-05 MED ORDER — ATORVASTATIN CALCIUM 20 MG PO TABS: 20.0000 mg | ORAL_TABLET | Freq: Every day | ORAL | 0 refills | Status: DC

## 2021-09-06 ENCOUNTER — Ambulatory Visit (HOSPITAL_COMMUNITY): Payer: Medicare HMO | Attending: Medical | Admitting: Physical Therapy

## 2021-09-06 ENCOUNTER — Other Ambulatory Visit: Payer: Self-pay

## 2021-09-06 DIAGNOSIS — M542 Cervicalgia: Secondary | ICD-10-CM | POA: Diagnosis not present

## 2021-09-06 DIAGNOSIS — R293 Abnormal posture: Secondary | ICD-10-CM | POA: Insufficient documentation

## 2021-09-06 DIAGNOSIS — M25572 Pain in left ankle and joints of left foot: Secondary | ICD-10-CM | POA: Insufficient documentation

## 2021-09-06 DIAGNOSIS — R2689 Other abnormalities of gait and mobility: Secondary | ICD-10-CM | POA: Insufficient documentation
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
## 2021-09-06 NOTE — Patient Instructions (Signed)
Access Code: [email or web address] URL: [email or web address] Date: 09/06/2021 Prepared by: Georges Lynch  Exercises Seated Upper Trap Stretch - 3 x daily - 7 x weekly - 1 sets - 3 reps - 20-30 second hold

## 2021-09-07 ENCOUNTER — Ambulatory Visit (HOSPITAL_COMMUNITY): Payer: Medicare HMO | Admitting: Physical Therapy

## 2021-09-07 ENCOUNTER — Encounter (HOSPITAL_COMMUNITY): Payer: Self-pay | Admitting: Physical Therapy

## 2021-09-07 DIAGNOSIS — R293 Abnormal posture: Secondary | ICD-10-CM | POA: Diagnosis not present

## 2021-09-07 DIAGNOSIS — M542 Cervicalgia: Secondary | ICD-10-CM | POA: Diagnosis not present

## 2021-09-07 DIAGNOSIS — R2689 Other abnormalities of gait and mobility: Secondary | ICD-10-CM

## 2021-09-07 DIAGNOSIS — M25572 Pain in left ankle and joints of left foot: Secondary | ICD-10-CM
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
## 2021-09-08 ENCOUNTER — Encounter (HOSPITAL_COMMUNITY): Payer: Medicare HMO

## 2021-09-08 ENCOUNTER — Telehealth: Payer: Self-pay | Admitting: Neurology
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
## 2021-09-08 NOTE — Telephone Encounter (Signed)
Pt called needing a refill for her meclizine (ANTIVERT) 25 MG tablet and her Ketorolac 10mg   She is needing this sent to the .

## 2021-09-09 ENCOUNTER — Encounter: Payer: Self-pay | Admitting: Adult Health

## 2021-09-09 ENCOUNTER — Other Ambulatory Visit: Payer: Self-pay

## 2021-09-09 ENCOUNTER — Ambulatory Visit: Payer: Medicare HMO | Admitting: Adult Health

## 2021-09-09 ENCOUNTER — Other Ambulatory Visit (HOSPITAL_COMMUNITY)
Admission: RE | Admit: 2021-09-09 | Discharge: 2021-09-09 | Disposition: A | Discharge status: Home / Self Care | Payer: Medicare HMO | Source: Ambulatory Visit | Attending: Adult Health | Admitting: Adult Health

## 2021-09-09 ENCOUNTER — Other Ambulatory Visit (HOSPITAL_COMMUNITY): Payer: Self-pay

## 2021-09-09 VITALS — BP 124/77 | HR 82 | Ht 67.0 in | Wt 181.4 lb

## 2021-09-09 DIAGNOSIS — Z01419 Encounter for gynecological examination (general) (routine) without abnormal findings: Secondary | ICD-10-CM | POA: Diagnosis not present

## 2021-09-09 DIAGNOSIS — Z1151 Encounter for screening for human papillomavirus (HPV): Secondary | ICD-10-CM | POA: Insufficient documentation

## 2021-09-09 DIAGNOSIS — F332 Major depressive disorder, recurrent severe without psychotic features: Secondary | ICD-10-CM

## 2021-09-09 DIAGNOSIS — F4010 Social phobia, unspecified: Secondary | ICD-10-CM

## 2021-09-09 DIAGNOSIS — Z780 Asymptomatic menopausal state: Secondary | ICD-10-CM

## 2021-09-09 DIAGNOSIS — N83202 Unspecified ovarian cyst, left side: Secondary | ICD-10-CM

## 2021-09-09 DIAGNOSIS — Z124 Encounter for screening for malignant neoplasm of cervix: Secondary | ICD-10-CM | POA: Diagnosis not present

## 2021-09-09 MED ORDER — VENLAFAXINE HCL ER 150 MG PO CP24: 150.0000 mg | ORAL_CAPSULE | Freq: Every day | ORAL | 2 refills | Status: DC

## 2021-09-09 MED ORDER — QUETIAPINE FUMARATE 100 MG PO TABS: 100.0000 mg | ORAL_TABLET | Freq: Every day | ORAL | 2 refills | Status: DC

## 2021-09-09 MED ORDER — BUSPIRONE HCL 30 MG PO TABS: 30.0000 mg | ORAL_TABLET | Freq: Two times a day (BID) | ORAL | 2 refills | Status: DC

## 2021-09-09 MED ORDER — FLUOXETINE HCL 40 MG PO CAPS: 40.0000 mg | ORAL_CAPSULE | Freq: Every day | ORAL | 2 refills | Status: DC

## 2021-09-09 NOTE — Addendum Note (Signed)
Addended by: Leilani Able, Brandonn Capelli A on: 09/09/2021 12:22 PM   Modules accepted: Orders
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
## 2021-09-12 ENCOUNTER — Encounter (HOSPITAL_COMMUNITY): Payer: Self-pay

## 2021-09-12 ENCOUNTER — Other Ambulatory Visit: Payer: Self-pay

## 2021-09-12 ENCOUNTER — Ambulatory Visit (HOSPITAL_COMMUNITY): Payer: Medicare HMO

## 2021-09-12 DIAGNOSIS — M542 Cervicalgia: Secondary | ICD-10-CM | POA: Diagnosis not present

## 2021-09-12 DIAGNOSIS — R293 Abnormal posture: Secondary | ICD-10-CM | POA: Diagnosis not present

## 2021-09-12 DIAGNOSIS — M25572 Pain in left ankle and joints of left foot: Secondary | ICD-10-CM | POA: Diagnosis not present

## 2021-09-12 DIAGNOSIS — R2689 Other abnormalities of gait and mobility: Secondary | ICD-10-CM

## 2021-09-12 NOTE — Patient Instructions (Addendum)
Extension: Isometric (Supine / Sitting)    Position Helper: Place one hand behind head. Stabilize with other hand. Motion - Cue patient to exhale while pressing head straight back into helper's hand. -Keep chin tipped down. -Helper blocks movement. CAUTION: Use gentle pressure to avoid neck strain. Hold _5__ seconds. Relax. Repeat _10__ times. Do _1__ sessions per day. Variation:  Copyright  VHI. All rights reserved.   Lateral Flexion: Isometric (Supine / Sitting)    Position Helper: Place hand at left side of head. Stabilize with other hand. Motion - Cue patient to exhale while tilting head to the side into helper's hand. -Helper blocks movement. CAUTION: Use gentle pressure to avoid neck strain. Hold ___ seconds. Relax. Repeat ___ times. Repeat to other side. Do ___ sessions per day. Variation: Perform with head tipped toward: left right   Copyright  VHI. All rights reserved.   Flexion: Isometric (Supine / Sitting)    Position Helper: Place hand on forehead. Stabilize with other hand. Motion -Cue patient to exhale while pushing head into helper's hand. -Keep chin tipped down. -Helper blocks movement. CAUTION: Use gentle pressure to avoid neck strain. Hold ___ seconds. Relax. Repeat ___ times. Do ___ sessions per day. Variation: Lying: Perform with head lifted ___ inches off the bed. Sitting: Perform with head tipped forward.   Copyright  VHI. All rights reserved.   Rotation: Isometric (Supine / Sitting)    Position Helper: Place hands firmly on both sides of head. Motion -Cue patient to exhale while turning head to left -Helper blocks movement. CAUTION: Use gentle pressure to avoid neck strain. Hold ___ seconds. Relax. Repeat ___ times. Repeat in other direction. Do ___ sessions per day. Variation: Perform with head turned toward: left right   Copyright  VHI. All rights reserved.
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
## 2021-09-13 ENCOUNTER — Ambulatory Visit (HOSPITAL_COMMUNITY): Payer: Medicare HMO

## 2021-09-13 DIAGNOSIS — M25572 Pain in left ankle and joints of left foot: Secondary | ICD-10-CM

## 2021-09-13 DIAGNOSIS — M542 Cervicalgia: Secondary | ICD-10-CM | POA: Diagnosis not present

## 2021-09-13 DIAGNOSIS — R2689 Other abnormalities of gait and mobility: Secondary | ICD-10-CM

## 2021-09-13 DIAGNOSIS — R293 Abnormal posture: Secondary | ICD-10-CM | POA: Diagnosis not present

## 2021-09-13 LAB — CYTOLOGY - PAP
Comment: NEGATIVE
Diagnosis: NEGATIVE
High risk HPV: NEGATIVE
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
## 2021-09-14 ENCOUNTER — Encounter (HOSPITAL_COMMUNITY): Payer: Self-pay | Admitting: Psychiatry

## 2021-09-14 ENCOUNTER — Ambulatory Visit: Payer: Medicare HMO | Admitting: Psychiatry

## 2021-09-14 ENCOUNTER — Other Ambulatory Visit: Payer: Self-pay

## 2021-09-14 ENCOUNTER — Telehealth (HOSPITAL_BASED_OUTPATIENT_CLINIC_OR_DEPARTMENT_OTHER): Payer: Medicare HMO | Admitting: Psychiatry

## 2021-09-14 ENCOUNTER — Encounter: Payer: Self-pay | Admitting: Psychiatry

## 2021-09-14 VITALS — Wt 181.0 lb

## 2021-09-14 VITALS — BP 122/82 | HR 78 | Ht 67.0 in | Wt 181.5 lb

## 2021-09-14 DIAGNOSIS — F900 Attention-deficit hyperactivity disorder, predominantly inattentive type: Secondary | ICD-10-CM

## 2021-09-14 DIAGNOSIS — F332 Major depressive disorder, recurrent severe without psychotic features: Secondary | ICD-10-CM | POA: Diagnosis not present

## 2021-09-14 DIAGNOSIS — E348 Other specified endocrine disorders: Secondary | ICD-10-CM | POA: Diagnosis not present

## 2021-09-14 DIAGNOSIS — F4010 Social phobia, unspecified: Secondary | ICD-10-CM

## 2021-09-14 DIAGNOSIS — F4321 Adjustment disorder with depressed mood: Secondary | ICD-10-CM

## 2021-09-14 DIAGNOSIS — R519 Headache, unspecified: Secondary | ICD-10-CM

## 2021-09-14 MED ORDER — LORAZEPAM 0.5 MG PO TABS: ORAL_TABLET | ORAL | 0 refills | Status: DC

## 2021-09-14 MED ORDER — BUSPIRONE HCL 30 MG PO TABS: 30.0000 mg | ORAL_TABLET | Freq: Two times a day (BID) | ORAL | 2 refills | Status: DC

## 2021-09-14 MED ORDER — QULIPTA 60 MG PO TABS: 60.0000 mg | ORAL_TABLET | Freq: Every day | ORAL | 2 refills | Status: DC

## 2021-09-14 MED ORDER — QUETIAPINE FUMARATE 100 MG PO TABS: 100.0000 mg | ORAL_TABLET | Freq: Every day | ORAL | 2 refills | Status: DC

## 2021-09-14 MED ORDER — VENLAFAXINE HCL ER 150 MG PO CP24: 150.0000 mg | ORAL_CAPSULE | Freq: Every day | ORAL | 2 refills | Status: DC

## 2021-09-14 MED ORDER — AMPHETAMINE-DEXTROAMPHETAMINE 10 MG PO TABS: ORAL_TABLET | ORAL | 0 refills | Status: DC

## 2021-09-14 MED ORDER — FLUOXETINE HCL 40 MG PO CAPS: 40.0000 mg | ORAL_CAPSULE | Freq: Every day | ORAL | 2 refills | Status: DC

## 2021-09-14 MED ORDER — KETOROLAC TROMETHAMINE 10 MG PO TABS: 10.0000 mg | ORAL_TABLET | Freq: Three times a day (TID) | ORAL | 0 refills | Status: AC

## 2021-09-14 NOTE — Patient Instructions (Addendum)
Stop Nurtec. Start Qulipta 60 mg daily for headache prevention Take Toradol 10 mg three times a day for 5 days to help break your headaches MRI brain
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
## 2021-09-15 ENCOUNTER — Encounter (HOSPITAL_COMMUNITY): Payer: Medicare HMO | Admitting: Physical Therapy

## 2021-09-19 ENCOUNTER — Ambulatory Visit (HOSPITAL_COMMUNITY): Payer: Medicare HMO | Admitting: Physical Therapy

## 2021-09-20 ENCOUNTER — Ambulatory Visit (HOSPITAL_COMMUNITY): Payer: Medicare HMO | Admitting: Physical Therapy

## 2021-09-20 ENCOUNTER — Encounter (HOSPITAL_COMMUNITY): Payer: Self-pay | Admitting: Physical Therapy

## 2021-09-20 ENCOUNTER — Other Ambulatory Visit: Payer: Self-pay

## 2021-09-20 DIAGNOSIS — R293 Abnormal posture: Secondary | ICD-10-CM

## 2021-09-20 DIAGNOSIS — M542 Cervicalgia: Secondary | ICD-10-CM | POA: Diagnosis not present

## 2021-09-20 DIAGNOSIS — R2689 Other abnormalities of gait and mobility: Secondary | ICD-10-CM

## 2021-09-20 DIAGNOSIS — M25572 Pain in left ankle and joints of left foot: Secondary | ICD-10-CM
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
## 2021-09-21 ENCOUNTER — Other Ambulatory Visit: Payer: Self-pay | Admitting: Psychiatry

## 2021-09-21 ENCOUNTER — Ambulatory Visit (HOSPITAL_COMMUNITY): Payer: Medicare HMO | Admitting: Physical Therapy

## 2021-09-21 ENCOUNTER — Encounter (HOSPITAL_COMMUNITY): Payer: Self-pay | Admitting: Physical Therapy

## 2021-09-21 ENCOUNTER — Telehealth: Payer: Self-pay | Admitting: *Deleted

## 2021-09-21 DIAGNOSIS — M25572 Pain in left ankle and joints of left foot: Secondary | ICD-10-CM

## 2021-09-21 DIAGNOSIS — R293 Abnormal posture: Secondary | ICD-10-CM

## 2021-09-21 DIAGNOSIS — M542 Cervicalgia: Secondary | ICD-10-CM

## 2021-09-21 DIAGNOSIS — R2689 Other abnormalities of gait and mobility: Secondary | ICD-10-CM

## 2021-09-21 MED ORDER — PROPRANOLOL HCL 20 MG PO TABS: 20.0000 mg | ORAL_TABLET | Freq: Two times a day (BID) | ORAL | 2 refills | Status: DC
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
## 2021-09-21 NOTE — Telephone Encounter (Signed)
Joycie Peek PA, key: [id number]  Tried/failed: Effexor 150 mg daily Fluoxetine 40 mg daily Robaxin Nurtec Topamax contraindicated due to kidney stones Toradol Patient has needle phobia. Your information has been sent to [facility].

## 2021-09-21 NOTE — Telephone Encounter (Signed)
We can try propranolol 20 mg BID for prevention. She should let me know if she experiences light headedness when standing quickly or exercising. If she is not able to tolerate this we may have to try an injection as the next step

## 2021-09-21 NOTE — Telephone Encounter (Signed)
Called patient and informed her that her insurance denied Rebecca Andrade, wants her to try an injection. Advised her Dr Delena Bali sent in Rx for propranol take one tab twice a day. I advised she call for any lightheadedness she may experience. Patient verbalized understanding, appreciation.

## 2021-09-21 NOTE — Telephone Encounter (Addendum)
Received call from [facility] with additional questions as to why she can't try the preferred: Aimovig, Emgality. I informed him of her needle phobia, answered his questions. We will receive determination in 24 hours via fax.  Received fax form [employer]: Rebecca Andrade is denied, provider needs ot give medical reasons why preferred drugs wouldn't work or would have bad side effects for patient. Sent to MD.

## 2021-09-26 ENCOUNTER — Ambulatory Visit (HOSPITAL_COMMUNITY): Payer: Medicare HMO

## 2021-09-26 ENCOUNTER — Other Ambulatory Visit: Payer: Self-pay | Admitting: Psychiatry

## 2021-09-26 ENCOUNTER — Telehealth: Payer: Self-pay | Admitting: Psychiatry

## 2021-09-26 ENCOUNTER — Other Ambulatory Visit: Payer: Self-pay

## 2021-09-26 DIAGNOSIS — M542 Cervicalgia: Secondary | ICD-10-CM | POA: Diagnosis not present

## 2021-09-26 DIAGNOSIS — R2689 Other abnormalities of gait and mobility: Secondary | ICD-10-CM

## 2021-09-26 DIAGNOSIS — R293 Abnormal posture: Secondary | ICD-10-CM | POA: Diagnosis not present

## 2021-09-26 DIAGNOSIS — M25572 Pain in left ankle and joints of left foot: Secondary | ICD-10-CM | POA: Diagnosis not present

## 2021-09-26 MED ORDER — MECLIZINE HCL 25 MG PO TABS: 25.0000 mg | ORAL_TABLET | Freq: Two times a day (BID) | ORAL | 5 refills | Status: DC | PRN

## 2021-09-26 NOTE — Patient Instructions (Signed)
Knee: Flexion    Sit on feet with toes pointed. Hold 30____ seconds. Repeat _3__ times. Do _1___ sessions per day. CAUTION: Do not force joint. Stretch should be gentle and slow. Activity: Prop on arms and attempt to sit on heels.*  Copyright  VHI. All rights reserved.   Soleus Stretch    Stand with right foot back, both knees bent. Keeping heel on floor, toes straight toward wall, lean into wall until stretch is felt in lower calf. Hold 30_ seconds. Repeat _3___ times per set. Do __1__ sets per session. Do _1___ sessions per day.  http://orth.exer.us/25   Copyright  VHI. All rights reserved.

## 2021-09-26 NOTE — Telephone Encounter (Signed)
Meclizine Rx sent in to her pharmacy, thanks

## 2021-09-26 NOTE — Telephone Encounter (Signed)
Pt has called for a refill on her meclizine (ANTIVERT) 25 MG tablet to [facility]
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
## 2021-09-28 ENCOUNTER — Ambulatory Visit (HOSPITAL_COMMUNITY): Payer: Medicare HMO | Admitting: Physical Therapy

## 2021-10-05 ENCOUNTER — Telehealth (HOSPITAL_COMMUNITY): Payer: Self-pay | Admitting: *Deleted

## 2021-10-05 ENCOUNTER — Telehealth: Payer: Self-pay | Admitting: Physician Assistant

## 2021-10-05 ENCOUNTER — Ambulatory Visit (INDEPENDENT_AMBULATORY_CARE_PROVIDER_SITE_OTHER): Payer: Medicare HMO | Admitting: Medical

## 2021-10-05 ENCOUNTER — Other Ambulatory Visit: Payer: Self-pay

## 2021-10-05 VITALS — BP 120/80 | HR 86 | Wt 187.2 lb

## 2021-10-05 DIAGNOSIS — Z87442 Personal history of urinary calculi: Secondary | ICD-10-CM

## 2021-10-05 DIAGNOSIS — F339 Major depressive disorder, recurrent, unspecified: Secondary | ICD-10-CM | POA: Diagnosis not present

## 2021-10-05 DIAGNOSIS — R519 Headache, unspecified: Secondary | ICD-10-CM

## 2021-10-05 DIAGNOSIS — R911 Solitary pulmonary nodule: Secondary | ICD-10-CM | POA: Insufficient documentation

## 2021-10-05 DIAGNOSIS — N83202 Unspecified ovarian cyst, left side: Secondary | ICD-10-CM | POA: Diagnosis not present

## 2021-10-05 DIAGNOSIS — M2550 Pain in unspecified joint: Secondary | ICD-10-CM

## 2021-10-05 DIAGNOSIS — E7800 Pure hypercholesterolemia, unspecified: Secondary | ICD-10-CM

## 2021-10-05 DIAGNOSIS — I700 Atherosclerosis of aorta: Secondary | ICD-10-CM | POA: Diagnosis not present

## 2021-10-05 DIAGNOSIS — Z124 Encounter for screening for malignant neoplasm of cervix: Secondary | ICD-10-CM

## 2021-10-05 DIAGNOSIS — N3281 Overactive bladder: Secondary | ICD-10-CM

## 2021-10-05 DIAGNOSIS — Z59819 Housing instability, housed unspecified: Secondary | ICD-10-CM | POA: Insufficient documentation

## 2021-10-05 DIAGNOSIS — K5909 Other constipation: Secondary | ICD-10-CM | POA: Diagnosis not present

## 2021-10-05 MED ORDER — LINACLOTIDE 290 MCG PO CAPS: ORAL_CAPSULE | ORAL | 3 refills | Status: DC

## 2021-10-05 MED ORDER — LINACLOTIDE 290 MCG PO CAPS
ORAL_CAPSULE | ORAL | 1 refills | Status: DC
Start: 2021-10-05 — End: 2021-10-05

## 2021-10-05 MED ORDER — ONDANSETRON HCL 4 MG PO TABS: 4.0000 mg | ORAL_TABLET | Freq: Three times a day (TID) | ORAL | 2 refills | Status: DC | PRN

## 2021-10-05 NOTE — Telephone Encounter (Signed)
Pt called and wants to know if you have released her   please call
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
## 2021-10-05 NOTE — Patient Instructions (Signed)
Check out [employer] about housing options

## 2021-10-05 NOTE — Telephone Encounter (Signed)
Yup. I did discuss that too (dx) . She's fine with that.

## 2021-10-05 NOTE — Telephone Encounter (Signed)
Pt was referring to PT as she does not go anymore and I advised if you no longer needs PT then you have been released

## 2021-10-05 NOTE — Telephone Encounter (Signed)
Patient suffers from major depressive disorder and social anxiety disorder.  We can add the diagnosis of social anxiety disorder and mentioned in the letter that her dog's companion is necessary to keep her anxiety under control.  Without her dog she will decompensate and her symptoms may relapse. Please check with the patient if she is okay to include diagnosis in the latter.

## 2021-10-05 NOTE — Telephone Encounter (Signed)
Pt called upset regarding she says landlord that is pursuing a "wrongful eviction" of her due to her emotional support dog. We have supplied a letter for the support animal on 02/10/21 when pt moved in to this home. Pt is requesting another more detailed letter so as she may not be evicted or to show in eviction court. Previous letter:  Cathy 7, 2022      Patient:  Rebecca Andrade Date of Birth:  [birth year] Date of Visit:  02/07/2021    To Whom it May Concern:  Rebecca Andrade is currently a patient in our clinic under the care of Dr. Kathryne Sharper for Grief, Major Depressive Disorder, Social Anxiety Disorder, and ADHD. She has requested permission to keep her dog, "Junebug", as an emotional support animal to aid her in coping with the symptoms associated with her diagnoses. Dr. Lolly Mustache is in support of this request and believes it would be beneficial to her mental health to keep her dog.   If you require additional information, please contact Courtne Blyth to obtain written authorization to release protected health information. [employer], building services do not permit release of information without signed authorization. Raiden may stop by the office to complete an authorization to release protected health information. Thank you for your cooperation in this regard.    Sincerely,      Alinda Money, CMA for [facility]  02/10/2021, 10:32 AM  Pt advised that letter explains wh yshe needs support animal and that we cannot guarantee with another letter that landlord will let her stay. They have advised that she may stay, but dog must go due to alleged aggressiveness. Pt denies. FYI will write a bit more detailed letter and send to pt. Letter will be in chart.

## 2021-10-10 ENCOUNTER — Ambulatory Visit
Admission: RE | Admit: 2021-10-10 | Discharge: 2021-10-10 | Disposition: A | Discharge status: Home / Self Care | Payer: Medicare HMO | Source: Ambulatory Visit | Attending: Psychiatry | Admitting: Psychiatry

## 2021-10-10 ENCOUNTER — Other Ambulatory Visit: Payer: Self-pay

## 2021-10-10 DIAGNOSIS — R519 Headache, unspecified: Secondary | ICD-10-CM | POA: Diagnosis not present

## 2021-10-10 MED ORDER — GADOBENATE DIMEGLUMINE 529 MG/ML IV SOLN
17.0000 mL | Freq: Once | INTRAVENOUS | Status: AC | PRN
  Administered 2021-10-10: 17 mL via INTRAVENOUS

## 2021-10-11 ENCOUNTER — Telehealth: Payer: Self-pay

## 2021-10-11 NOTE — Telephone Encounter (Signed)
The pineal cyst is the same size as it was per the MRI report in 2018: "There is a stable 5 mm pineal cyst.". There's no further workup that needs to be done for the cyst at this time

## 2021-10-11 NOTE — Telephone Encounter (Signed)
Contacted pt, informed her MRI brain shows that her pineal cyst is stable and has not changed from prior MRI in 2018. The rest of her brain looks normal. She asked hat size was it, I informed her about 5-6 mm. She states so it has changed and grown. She asked what size was it in prior MRI in 2018 because the statement  "It hasn't changed" is incorrect, she believed it has grown. Please advise

## 2021-10-11 NOTE — Telephone Encounter (Signed)
-----   Message from Ocie Doyne, MD sent at 10/11/2021  9:03 AM EST ----- MRI brain shows that her pineal cyst is stable and has not changed from prior MRI in 2018. The rest of her brain looks normal.

## 2021-10-11 NOTE — Telephone Encounter (Signed)
Contacted pt again, informed her it is the same size as reported in 2018 MRI. She was appreciative.

## 2021-10-19 ENCOUNTER — Other Ambulatory Visit (HOSPITAL_COMMUNITY): Payer: Self-pay | Admitting: Psychiatry

## 2021-10-19 ENCOUNTER — Telehealth (HOSPITAL_COMMUNITY): Payer: Self-pay

## 2021-10-19 DIAGNOSIS — F900 Attention-deficit hyperactivity disorder, predominantly inattentive type: Secondary | ICD-10-CM

## 2021-10-19 DIAGNOSIS — F332 Major depressive disorder, recurrent severe without psychotic features: Secondary | ICD-10-CM

## 2021-10-19 DIAGNOSIS — F4010 Social phobia, unspecified: Secondary | ICD-10-CM

## 2021-10-19 MED ORDER — AMPHETAMINE-DEXTROAMPHETAMINE 10 MG PO TABS: ORAL_TABLET | ORAL | 0 refills | Status: DC

## 2021-10-19 NOTE — Telephone Encounter (Signed)
This is Dr. Sheela Stack patient. Patient called requesting a refill on her Adderall 10mg  to be sent to [facility] on [address] in [place]. Please review and advise. Thank you

## 2021-10-19 NOTE — Telephone Encounter (Signed)
sent 

## 2021-10-20 ENCOUNTER — Telehealth (HOSPITAL_COMMUNITY): Payer: Self-pay | Admitting: *Deleted

## 2021-10-20 NOTE — Telephone Encounter (Signed)
Pt called again today anxious and crying regarding landlord trying to evict her. Pt has stated that it's because of her dog "Junebug" and at other times has stated that it's for non payment of rent. Pt states today that she has kept all her payment receipts so now it's back to being about the dog. This office has written several letters stating that  pt needs support animal due to pt anxiety and depression. Pt continues to call office asking [place] to "fix the situation". Pt has been advised that [place] letters have stated that pt requires dog per Dr. Lolly Mustache and that there is really nothing more we can do about this matter. Pt was advised to contact, and keep contacting, [employer] or DSS; pt is on food stamps so is in their system. Pt verbalizes understanding and says she will try but is worried about being homeless. Support and encouragement provided. Copies of letter have been sent to pt for lawyer. Pt encouraged to call office with any further questions or concerns.

## 2021-10-20 NOTE — Telephone Encounter (Signed)
NOTIFIED PATIENT °

## 2021-11-04 ENCOUNTER — Ambulatory Visit: Payer: Medicare HMO | Admitting: Adult Health

## 2021-11-04 ENCOUNTER — Ambulatory Visit (INDEPENDENT_AMBULATORY_CARE_PROVIDER_SITE_OTHER): Payer: Medicare HMO

## 2021-11-04 ENCOUNTER — Encounter: Payer: Self-pay | Admitting: Adult Health

## 2021-11-04 ENCOUNTER — Other Ambulatory Visit: Payer: Self-pay

## 2021-11-04 VITALS — BP 116/80 | HR 86 | Ht 67.0 in | Wt 187.0 lb

## 2021-11-04 DIAGNOSIS — N83202 Unspecified ovarian cyst, left side: Secondary | ICD-10-CM
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
## 2021-11-04 NOTE — Progress Notes (Signed)
PELVIC US TA/TV: homogeneous anteverted uterus,WNL,EEC 2.3 mm,normal right ovary,simple left ovarian cyst 3.1 x 3.2 x 2.6 cm,ovaries appear mobile,no pain during ultrasound,no free fluid  Chaperone Peggy

## 2021-11-08 ENCOUNTER — Telehealth (HOSPITAL_COMMUNITY): Payer: Self-pay

## 2021-11-08 NOTE — Telephone Encounter (Signed)
Patient and requested a dosage increase on her Quetiapine 100mg . She stated that's not enough to help her sleep. Please review and advise. Thank you

## 2021-11-08 NOTE — Telephone Encounter (Signed)
She is taking multiple medication.  Will not increase Seroquel.  She can try cutting down the Adderall from 40mg  to 30 mg.  Reducing the stimulant will help her sleep.

## 2021-11-10 ENCOUNTER — Telehealth (HOSPITAL_COMMUNITY): Payer: Self-pay

## 2021-11-10 ENCOUNTER — Other Ambulatory Visit (HOSPITAL_COMMUNITY): Payer: Self-pay | Admitting: Psychiatry

## 2021-11-10 DIAGNOSIS — F332 Major depressive disorder, recurrent severe without psychotic features: Secondary | ICD-10-CM

## 2021-11-10 NOTE — Telephone Encounter (Signed)
NOTIFIED PATIENT/RELAYED MESSAGE. PATIENT DID NOT PICK UP SO WRITER LVM

## 2021-11-10 NOTE — Telephone Encounter (Signed)
Writer called and spoke with patient regarding her Adderall 10mg  and her Seroquel 100mg . Advised patient to cut down the Adderall, per provider, and take 2 in the morning and 1 at noon instead of 2. Also, to take the Seroquel at least 1 hour before bed and to eat a little something with it. Patient was in agreement with plan. Writer instructed patient to call back and let know how the adjustment on the medication is working for her

## 2021-11-17 ENCOUNTER — Other Ambulatory Visit: Payer: Self-pay

## 2021-11-17 ENCOUNTER — Other Ambulatory Visit (HOSPITAL_COMMUNITY): Payer: Self-pay | Admitting: Psychiatry

## 2021-11-17 DIAGNOSIS — F4010 Social phobia, unspecified: Secondary | ICD-10-CM

## 2021-11-17 DIAGNOSIS — F332 Major depressive disorder, recurrent severe without psychotic features: Secondary | ICD-10-CM

## 2021-11-17 MED ORDER — ATORVASTATIN CALCIUM 20 MG PO TABS: 20.0000 mg | ORAL_TABLET | Freq: Every day | ORAL | 0 refills | Status: DC

## 2021-11-18 ENCOUNTER — Telehealth (HOSPITAL_COMMUNITY): Payer: Self-pay | Admitting: *Deleted

## 2021-11-18 DIAGNOSIS — F900 Attention-deficit hyperactivity disorder, predominantly inattentive type: Secondary | ICD-10-CM

## 2021-11-18 MED ORDER — AMPHETAMINE-DEXTROAMPHETAMINE 10 MG PO TABS: ORAL_TABLET | ORAL | 0 refills | Status: DC

## 2021-11-18 NOTE — Telephone Encounter (Signed)
Its been done ?

## 2021-11-18 NOTE — Telephone Encounter (Signed)
Writer called pt to confirm that Adderall script has been sent as pt has called several times to check on status. Unfortunately script was sent to [facility]. [facility] [address] has been deleted from pharmacy profile. [facility] Thank you!

## 2021-11-18 NOTE — Telephone Encounter (Signed)
Send to [facility]

## 2021-11-18 NOTE — Telephone Encounter (Signed)
[facility] Please call [facility] to cancel the script. Thanks

## 2021-11-18 NOTE — Telephone Encounter (Signed)
Pt called requesting refill of the Adderall 10 mg. Pt next appointment scheduled for 12/13/21. Please review.

## 2021-11-24 DIAGNOSIS — H5200 Hypermetropia, unspecified eye: Secondary | ICD-10-CM | POA: Diagnosis not present

## 2021-11-30 ENCOUNTER — Ambulatory Visit: Payer: Medicare HMO | Admitting: Medical

## 2021-12-03 DIAGNOSIS — H52209 Unspecified astigmatism, unspecified eye: Secondary | ICD-10-CM | POA: Diagnosis not present

## 2021-12-03 DIAGNOSIS — H5203 Hypermetropia, bilateral: Secondary | ICD-10-CM | POA: Diagnosis not present

## 2021-12-03 DIAGNOSIS — H524 Presbyopia: Secondary | ICD-10-CM | POA: Diagnosis not present

## 2021-12-06 ENCOUNTER — Ambulatory Visit: Payer: Medicare HMO | Admitting: Medical

## 2021-12-06 ENCOUNTER — Other Ambulatory Visit (HOSPITAL_COMMUNITY): Payer: Self-pay | Admitting: Psychiatry

## 2021-12-06 ENCOUNTER — Other Ambulatory Visit: Payer: Self-pay | Admitting: Medical

## 2021-12-06 DIAGNOSIS — F332 Major depressive disorder, recurrent severe without psychotic features: Secondary | ICD-10-CM

## 2021-12-08 ENCOUNTER — Other Ambulatory Visit (HOSPITAL_COMMUNITY): Payer: Self-pay | Admitting: *Deleted

## 2021-12-08 DIAGNOSIS — F332 Major depressive disorder, recurrent severe without psychotic features: Secondary | ICD-10-CM

## 2021-12-08 MED ORDER — QUETIAPINE FUMARATE 100 MG PO TABS: 100.0000 mg | ORAL_TABLET | Freq: Every day | ORAL | 0 refills | Status: DC

## 2021-12-10 ENCOUNTER — Other Ambulatory Visit: Payer: Self-pay | Admitting: Psychiatry

## 2021-12-12 ENCOUNTER — Ambulatory Visit (INDEPENDENT_AMBULATORY_CARE_PROVIDER_SITE_OTHER): Payer: Medicare HMO | Admitting: Medical

## 2021-12-12 ENCOUNTER — Other Ambulatory Visit: Payer: Self-pay

## 2021-12-12 VITALS — BP 120/80 | HR 78 | Wt 194.2 lb

## 2021-12-12 DIAGNOSIS — R748 Abnormal levels of other serum enzymes: Secondary | ICD-10-CM

## 2021-12-12 DIAGNOSIS — R911 Solitary pulmonary nodule: Secondary | ICD-10-CM

## 2021-12-12 DIAGNOSIS — I700 Atherosclerosis of aorta: Secondary | ICD-10-CM

## 2021-12-12 MED ORDER — VITAMIN D (ERGOCALCIFEROL) 1.25 MG (50000 UNIT) PO CAPS: 50000.0000 [IU] | ORAL_CAPSULE | ORAL | 1 refills | Status: DC

## 2021-12-12 NOTE — Patient Instructions (Signed)
Recommendations for improving lipids:  Foods TO AVOID or limit - fried foods, high sugar foods, white bread, enriched flour, fast food, red meat, large amounts of cheese, processed foods such as little debbie cakes, cookies, pies, donuts, for example  Foods TO INCLUDE in the diet - whole grains such as whole grain pasta, whole grain bread, barley, steel cut oatmeal (not instant oatmeal), avocado, fish, green leafy vegetables, nuts, increased fiber in diet, and using olive oil in small amounts for cooking or as salad dressing vinaigrette.
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
## 2021-12-13 ENCOUNTER — Other Ambulatory Visit: Payer: Self-pay | Admitting: Medical

## 2021-12-13 ENCOUNTER — Telehealth (HOSPITAL_BASED_OUTPATIENT_CLINIC_OR_DEPARTMENT_OTHER): Payer: Medicare HMO | Admitting: Psychiatry

## 2021-12-13 ENCOUNTER — Encounter (HOSPITAL_COMMUNITY): Payer: Self-pay | Admitting: Psychiatry

## 2021-12-13 DIAGNOSIS — F900 Attention-deficit hyperactivity disorder, predominantly inattentive type: Secondary | ICD-10-CM

## 2021-12-13 DIAGNOSIS — F332 Major depressive disorder, recurrent severe without psychotic features: Secondary | ICD-10-CM

## 2021-12-13 DIAGNOSIS — E559 Vitamin D deficiency, unspecified: Secondary | ICD-10-CM

## 2021-12-13 DIAGNOSIS — F4010 Social phobia, unspecified: Secondary | ICD-10-CM

## 2021-12-13 DIAGNOSIS — R748 Abnormal levels of other serum enzymes: Secondary | ICD-10-CM

## 2021-12-13 LAB — LIPID PANEL
Chol/HDL Ratio: 2.6 ratio (ref 0.0–4.4)
Cholesterol, Total: 189 mg/dL (ref 100–199)
HDL: 74 mg/dL (ref >39)
LDL Chol Calc (NIH): 105 mg/dL — ABNORMAL HIGH (ref 0–99)
Triglycerides: 52 mg/dL (ref 0–149)
VLDL Cholesterol Cal: 10 mg/dL (ref 5–40)

## 2021-12-13 MED ORDER — FLUOXETINE HCL 40 MG PO CAPS: 40.0000 mg | ORAL_CAPSULE | Freq: Every day | ORAL | 2 refills | Status: DC

## 2021-12-13 MED ORDER — BUSPIRONE HCL 30 MG PO TABS: 30.0000 mg | ORAL_TABLET | Freq: Two times a day (BID) | ORAL | 2 refills | Status: DC

## 2021-12-13 MED ORDER — VENLAFAXINE HCL ER 150 MG PO CP24: 150.0000 mg | ORAL_CAPSULE | Freq: Every day | ORAL | 2 refills | Status: DC

## 2021-12-13 MED ORDER — QUETIAPINE FUMARATE 100 MG PO TABS: 100.0000 mg | ORAL_TABLET | Freq: Every day | ORAL | 2 refills | Status: DC

## 2021-12-13 MED ORDER — AMPHETAMINE-DEXTROAMPHETAMINE 10 MG PO TABS: ORAL_TABLET | ORAL | 0 refills | Status: DC

## 2021-12-13 NOTE — Progress Notes (Signed)
Virtual Visit via Telephone Note  I connected with Rebecca Andrade on 12/13/21 at  2:20 PM EST by telephone and verified that I am speaking with the correct person using two identifiers.  Location: Patient: Mother`s place Provider: Home Office   I discussed the limitations, risks, security and privacy concerns of performing an evaluation and management service by telephone and the availability of in person appointments. I also discussed with the patient that there may be a patient responsible charge related to this service. The patient expressed understanding and agreed to proceed.   History of Present Illness: Patient is evaluated by phone session.  She endorsed increased dysphoria and anxiety because she missed her dog "Junebug", who is now staying at her family member.  Patient told she has to evict because landlord did not allow to have a dog and she has no choice to move out.  Currently she is staying with her mother who does not allow animals either.  She is trying to have her own place.  She admitted sometime anxiety, irritability but she has no other choice is to wait for her place.  She does talk to her dog on the phone twice a week and she has seen a few times in person her dog.  Patient told otherwise things are going okay.  Her son is doing well.  She still does not go outside because of social anxiety and nervousness but she feels her depression is a stable.  She had requested to increase the Seroquel dose when she was going through crisis and we have recommended doing decrease the Adderall but now patient is sleeping better and she is taking Adderall 20 mg in the morning and 20 at noon.  Her attention and focus is okay.  Recently she had a blood work.  Her LDL is 105 which is stable.  She endorsed weight gain as she is not cooking by herself and her mother cooks food and she is eating too much.  She has a plan to start walking.  She is also taking cholesterol-lowering medication.  Patient  like to keep the current medication since she feels they are working.    Past Psychiatric History: Reviewed. H/O taking antidepressants since 1990.  H/O inpatient twice, one in 2005 and in 2014. H/O cocaine use, paranoia, hallucination, mood swing, anger and mania. No h/o suicidal attempt. Tried Zoloft, Paxil, Lexapro, Wellbutrin, lithium, Remeron, Topamax, Abilify, Pristiq, Effexor, amitriptyline, Xanax, temazepam, Valium and Cymbalta.  PCP prscribed Adderall for ADD.    Recent Results (from the past 2160 hour(s))  Lipid panel     Status: Abnormal   Collection Time: 12/12/21  1:37 PM  Result Value Ref Range   Cholesterol, Total 189 100 - 199 mg/dL   Triglycerides 52 0 - 149 mg/dL   HDL 74 >44 mg/dL   VLDL Cholesterol Cal 10 5 - 40 mg/dL   LDL Chol Calc (NIH) 818 (H) 0 - 99 mg/dL   Chol/HDL Ratio 2.6 0.0 - 4.4 ratio    Comment:                                   T. Chol/HDL Ratio  Men  Women                               1/2 Avg.Risk  3.4    3.3                                   Avg.Risk  5.0    4.4                                2X Avg.Risk  9.6    7.1                                3X Avg.Risk 23.4   11.0      Psychiatric Specialty Exam: Physical Exam  Review of Systems  Weight 194 lb (88 kg), last menstrual period 09/02/2018.There is no height or weight on file to calculate BMI.  General Appearance: NA  Eye Contact:  NA  Speech:  Slow  Volume:  Decreased  Mood:  Dysphoric  Affect:  NA  Thought Process:  Descriptions of Associations: Intact  Orientation:  Full (Time, Place, and Person)  Thought Content:  Rumination  Suicidal Thoughts:  No  Homicidal Thoughts:  No  Memory:  Immediate;   Fair Recent;   Fair Remote;   Fair  Judgement:  Intact  Insight:  Present  Psychomotor Activity:  NA  Concentration:  Concentration: Fair and Attention Span: Fair  Recall:  [employer] of Knowledge:  Fair  Language:  Fair  Akathisia:  No   Handed:  Right  AIMS (if indicated):     Assets:  Communication Skills Desire for Improvement Resilience Social Support Transportation  ADL's:  Intact  Cognition:  WNL  Sleep:   fair      Assessment and Plan: Major depressive disorder, recurrent.  Social anxiety disorder.  ADD, inattentive type.  Discussed her psychosocial issues.  Patient is trying to have her own place so she can move her dog with her as now dog is staying with a family member.  I encouraged she should walk every day and exercise as she has gained weight from the past.  She understand and hoping to start exercise very soon.  We will continue Adderall 20 mg in the morning and 20 mg in the afternoon, Prozac 40 mg daily, Seroquel 100 mg at bedtime, BuSpar 30 mg daily and venlafaxine 150 mg daily.  Discussed medication side effects and benefits.  I also reviewed blood work results.  Recommended to call us back if she has any question or any concern.  Patient is not interested in therapy.  Follow-up in 3 months.  Follow Up Instructions:    I discussed the assessment and treatment plan with the patient. The patient was provided an opportunity to ask questions and all were answered. The patient agreed with the plan and demonstrated an understanding of the instructions.   The patient was advised to call back or seek an in-person evaluation if the symptoms worsen or if the condition fails to improve as anticipated.  I provided 15 minutes of non-face-to-face time during this encounter.   Cleotis Nipper, MD

## 2021-12-14 ENCOUNTER — Other Ambulatory Visit: Payer: Self-pay | Admitting: Psychiatry

## 2021-12-19 ENCOUNTER — Other Ambulatory Visit: Payer: Self-pay | Admitting: Physician Assistant

## 2021-12-19 ENCOUNTER — Telehealth: Payer: Self-pay | Admitting: Physician Assistant

## 2021-12-19 DIAGNOSIS — E782 Mixed hyperlipidemia: Secondary | ICD-10-CM | POA: Insufficient documentation

## 2021-12-19 MED ORDER — ATORVASTATIN CALCIUM 20 MG PO TABS: 20.0000 mg | ORAL_TABLET | Freq: Every day | ORAL | 1 refills | Status: DC

## 2021-12-19 NOTE — Telephone Encounter (Signed)
Refilled atorvastatin 20 mg daily per chart Thanks

## 2021-12-19 NOTE — Telephone Encounter (Signed)
[facility] At[facility]atin 10 MG  Quantity 90

## 2021-12-22 ENCOUNTER — Telehealth: Payer: Self-pay | Admitting: Medical

## 2021-12-22 NOTE — Telephone Encounter (Signed)
CT chest was declined by [employer] [facility].  Apparently she had a lung nodule on a CT done at I think [facility] in September of last year.  I cannot seem to find the imaging study that shows the nodule.  We will need this before we can proceed with trying to get this updated CT chest covered by insurance  Please help hunt this CT scan down

## 2021-12-23 NOTE — Telephone Encounter (Signed)
[place] number- [phone]

## 2021-12-23 NOTE — Telephone Encounter (Signed)
We got a denial saying she had it done last year but she had a pulmonary lung nodule and was told to follow-up in 6 months on this.

## 2021-12-23 NOTE — Telephone Encounter (Signed)
Before I call patient, there is a CT chest abdominal in care everywhere. Is this what you need

## 2021-12-26 ENCOUNTER — Ambulatory Visit: Payer: Medicare HMO | Admitting: Psychiatry

## 2021-12-26 ENCOUNTER — Telehealth: Payer: Self-pay | Admitting: Medical

## 2021-12-26 NOTE — Telephone Encounter (Signed)
Left message for pt to call me back 

## 2021-12-26 NOTE — Telephone Encounter (Signed)
Please call patient.  We had placed the order for CT chest follow-up from prior nodule seen on September 2023 CT at [facility] denied the order due to radiology criteria suggesting she does not actually need a follow-up scan given the size of the nodule and lack of significant risk factors for lung cancer  Looking back in her chart record I reviewed with her insurance company the criteria for CT follow-up.  Since her nodule was only 3 mm back in September and she has no other significant risk factors, no follow-up is needed at this time for CT imaging.  However if she is still having any type of chronic cough and the recommendation will be to refer to pulmonology for further evaluation  Also please document again just for the record whether she is a former smoker, current smoker? And if former smoker when did she quit and how many years did she smoked for?

## 2021-12-26 NOTE — Telephone Encounter (Signed)
Pt states she has to clear her throat a lot but doesn't seem to cough beside that. She has never smoked please advised if clearing of throat is needed to be referred for pulmonary

## 2021-12-27 NOTE — Telephone Encounter (Signed)
Rebecca Andrade spoke with patient's insurance yesterday for peer to peer and was told it was not needed. So we do not have to schedule this

## 2021-12-27 NOTE — Telephone Encounter (Signed)
Pt was notified.  

## 2021-12-27 NOTE — Telephone Encounter (Signed)
Pt will try mucinex and zytrec to see if it helps and come back if it doesn't

## 2022-01-05 ENCOUNTER — Other Ambulatory Visit (HOSPITAL_COMMUNITY): Payer: Self-pay | Admitting: Psychiatry

## 2022-01-05 DIAGNOSIS — F332 Major depressive disorder, recurrent severe without psychotic features: Secondary | ICD-10-CM

## 2022-01-16 ENCOUNTER — Telehealth (HOSPITAL_COMMUNITY): Payer: Self-pay | Admitting: *Deleted

## 2022-01-16 DIAGNOSIS — F900 Attention-deficit hyperactivity disorder, predominantly inattentive type: Secondary | ICD-10-CM

## 2022-01-16 MED ORDER — AMPHETAMINE-DEXTROAMPHETAMINE 10 MG PO TABS: ORAL_TABLET | ORAL | 0 refills | Status: DC

## 2022-01-16 NOTE — Telephone Encounter (Signed)
Pt called requesting a refill of the Adderall 10 mg. Pt has an upcoming appointment on 03/14/22. Please review. ?

## 2022-01-16 NOTE — Telephone Encounter (Signed)
Done

## 2022-01-30 ENCOUNTER — Telehealth: Payer: Self-pay | Admitting: Medical

## 2022-01-30 ENCOUNTER — Other Ambulatory Visit (HOSPITAL_COMMUNITY): Payer: Self-pay | Admitting: Psychiatry

## 2022-01-30 ENCOUNTER — Other Ambulatory Visit: Payer: Self-pay | Admitting: Medical

## 2022-01-30 ENCOUNTER — Encounter: Payer: Self-pay | Admitting: Psychiatry

## 2022-01-30 ENCOUNTER — Other Ambulatory Visit: Payer: Self-pay | Admitting: Psychiatry

## 2022-01-30 ENCOUNTER — Ambulatory Visit: Payer: Medicare HMO | Admitting: Psychiatry

## 2022-01-30 VITALS — BP 112/74 | HR 92 | Ht 67.0 in | Wt 199.6 lb

## 2022-01-30 DIAGNOSIS — G43019 Migraine without aura, intractable, without status migrainosus: Secondary | ICD-10-CM

## 2022-01-30 DIAGNOSIS — F332 Major depressive disorder, recurrent severe without psychotic features: Secondary | ICD-10-CM

## 2022-01-30 MED ORDER — PROPRANOLOL HCL ER 60 MG PO CP24: 60.0000 mg | ORAL_CAPSULE | Freq: Every day | ORAL | 3 refills | Status: DC

## 2022-01-30 MED ORDER — LINACLOTIDE 290 MCG PO CAPS: ORAL_CAPSULE | ORAL | 0 refills | Status: DC

## 2022-01-30 MED ORDER — DICLOFENAC POTASSIUM 50 MG PO TABS: ORAL_TABLET | ORAL | 3 refills | Status: DC

## 2022-01-30 MED ORDER — PROMETHAZINE HCL 25 MG PO TABS: 25.0000 mg | ORAL_TABLET | Freq: Three times a day (TID) | ORAL | 3 refills | Status: DC | PRN

## 2022-01-30 NOTE — Telephone Encounter (Signed)
Pt called and is requesting that [id number] [place], [place] - [address] ? ?She does not uses the [facility] anymore  ?

## 2022-01-30 NOTE — Patient Instructions (Addendum)
Increase propranolol to 60 mg daily for headache prevention ?Take diclofenac as needed for migraine. Take 1-2 pills at the first sign of migraine. ?Take Phenergan up to three times a day as needed for nausea ?

## 2022-01-30 NOTE — Progress Notes (Signed)
? ?  CC:  headaches ? ?Follow-up Visit ? ?Last visit: 09/14/2021 ? ?Brief HPI: ?51 year old female with a history of pineal cyst, kidney stones, depression, anxiety, ADHD who follows in clinic for migraines. ? ?MRI brain was ordered at her last visit. Nurtec was discontinued due to lack of efficacy. Attempted to prescribe Qulipta, but insurance would not approve without trying an injection first. Propranolol was started for prevention. ? ?Interval History: ?Headaches have improved slightly since her last visit. She is currently having 3-4 migraines per week, and they are not lasting as long as they used to. She is tolerating propranolol well without side effects. Currently she takes ibuprofen as needed which helps somewhat. She would like to try something stronger for rescue. ? ?MRI brain showed a stable 5-6 mm pineal cyst. ? ?Headache days per month: 16 ?Headache free days per month: 14 ? ?Current Headache Regimen: ?Preventative: propranolol 20 mg BID ?Abortive: ibuprofen ? ?Prior Therapies                                  ?Effexor 150 mg daily ?Fluoxetine 40 mg daily ?propranolol ?Robaxin ?Nurtec ?Topamax contraindicated due to kidney stones ?toradol ? ?Physical Exam:  ? ?Vital Signs: ?LMP 09/02/2018  ?GENERAL:  well appearing, in no acute distress, alert  ?SKIN:  Color, texture, turgor normal. No rashes or lesions ?HEAD:  Normocephalic/atraumatic. ?RESP: normal respiratory effort ?MSK:  No gross joint deformities.  ? ?NEUROLOGICAL: ?Mental Status: Alert, oriented to person, place and time, Follows commands, and Speech fluent and appropriate. ?Cranial Nerves: PERRL, face symmetric, no dysarthria, hearing grossly intact ?Motor: moves all extremities equally ?Gait: normal-based. ? ?IMPRESSION: ?51 year old female with a history of pineal cyst, kidney stones, depression, anxiety, ADHD who presents for follow up of migraines. She is no longer having daily headaches, but continues to have migraines 3-4 times per week.  Will increase propranolol to 60 mg daily. Discussed rescue options, and she would prefer to take an NSAID rather than start a triptan at this time. Will start diclofenac for rescue. ? ?PLAN: ?-Preventive: Increase propranolol to 60 mg daily ?-Rescue: Start diclofenac 50-100 mg PRN, start phenergan 25 mg PRN for nausea ? ? ?Follow-up: 4 months ? ?I spent a total of 21 minutes on the date of the service. Headache education was done. Discussed treatment options including preventive and acute medications. Discussed medication side effects, adverse reactions and drug interactions. Written educational materials and patient instructions outlining all of the above were given. ? ?Genia Harold, MD ?01/30/22 ?2:55 PM ? ?

## 2022-02-03 ENCOUNTER — Other Ambulatory Visit (HOSPITAL_COMMUNITY): Payer: Self-pay | Admitting: Psychiatry

## 2022-02-03 DIAGNOSIS — F4010 Social phobia, unspecified: Secondary | ICD-10-CM

## 2022-02-03 DIAGNOSIS — F332 Major depressive disorder, recurrent severe without psychotic features: Secondary | ICD-10-CM

## 2022-02-15 ENCOUNTER — Telehealth (HOSPITAL_COMMUNITY): Payer: Self-pay | Admitting: *Deleted

## 2022-02-15 DIAGNOSIS — F900 Attention-deficit hyperactivity disorder, predominantly inattentive type: Secondary | ICD-10-CM

## 2022-02-15 MED ORDER — AMPHETAMINE-DEXTROAMPHETAMINE 10 MG PO TABS: ORAL_TABLET | ORAL | 0 refills | Status: DC

## 2022-02-15 NOTE — Telephone Encounter (Signed)
Pt called requesting refill on the Adderall 10 mg. Refill due tomorrow. Pt has an upcoming 03/14/22.  ?

## 2022-02-15 NOTE — Telephone Encounter (Signed)
Done

## 2022-02-27 ENCOUNTER — Other Ambulatory Visit (HOSPITAL_COMMUNITY): Payer: Self-pay | Admitting: Psychiatry

## 2022-02-27 DIAGNOSIS — F332 Major depressive disorder, recurrent severe without psychotic features: Secondary | ICD-10-CM

## 2022-03-02 ENCOUNTER — Other Ambulatory Visit: Payer: Self-pay | Admitting: Psychiatry

## 2022-03-02 ENCOUNTER — Other Ambulatory Visit (HOSPITAL_COMMUNITY): Payer: Self-pay | Admitting: Psychiatry

## 2022-03-02 DIAGNOSIS — F332 Major depressive disorder, recurrent severe without psychotic features: Secondary | ICD-10-CM

## 2022-03-02 DIAGNOSIS — F4010 Social phobia, unspecified: Secondary | ICD-10-CM

## 2022-03-07 ENCOUNTER — Other Ambulatory Visit (HOSPITAL_COMMUNITY): Payer: Self-pay | Admitting: *Deleted

## 2022-03-07 DIAGNOSIS — F332 Major depressive disorder, recurrent severe without psychotic features: Secondary | ICD-10-CM

## 2022-03-07 DIAGNOSIS — F4010 Social phobia, unspecified: Secondary | ICD-10-CM

## 2022-03-07 MED ORDER — FLUOXETINE HCL 40 MG PO CAPS: 40.0000 mg | ORAL_CAPSULE | Freq: Every day | ORAL | 0 refills | Status: DC

## 2022-03-07 MED ORDER — QUETIAPINE FUMARATE 100 MG PO TABS: 100.0000 mg | ORAL_TABLET | Freq: Every day | ORAL | 0 refills | Status: DC

## 2022-03-07 MED ORDER — VENLAFAXINE HCL ER 150 MG PO CP24: 150.0000 mg | ORAL_CAPSULE | Freq: Every day | ORAL | 0 refills | Status: DC

## 2022-03-08 ENCOUNTER — Ambulatory Visit
Admission: RE | Admit: 2022-03-08 | Discharge: 2022-03-08 | Disposition: A | Discharge status: Home / Self Care | Payer: Medicare HMO | Source: Ambulatory Visit | Attending: Medical | Admitting: Medical

## 2022-03-08 ENCOUNTER — Other Ambulatory Visit: Payer: Medicare HMO

## 2022-03-08 DIAGNOSIS — R899 Unspecified abnormal finding in specimens from other organs, systems and tissues: Secondary | ICD-10-CM | POA: Diagnosis not present

## 2022-03-08 DIAGNOSIS — R748 Abnormal levels of other serum enzymes: Secondary | ICD-10-CM | POA: Diagnosis not present

## 2022-03-08 DIAGNOSIS — R911 Solitary pulmonary nodule: Secondary | ICD-10-CM | POA: Diagnosis not present

## 2022-03-08 DIAGNOSIS — E559 Vitamin D deficiency, unspecified: Secondary | ICD-10-CM | POA: Diagnosis not present

## 2022-03-08 DIAGNOSIS — R918 Other nonspecific abnormal finding of lung field: Secondary | ICD-10-CM | POA: Diagnosis not present

## 2022-03-09 ENCOUNTER — Other Ambulatory Visit: Payer: Self-pay | Admitting: Medical

## 2022-03-09 ENCOUNTER — Other Ambulatory Visit: Payer: Medicare HMO

## 2022-03-09 DIAGNOSIS — R748 Abnormal levels of other serum enzymes: Secondary | ICD-10-CM

## 2022-03-09 LAB — HEPATIC FUNCTION PANEL
ALT: 13 [IU]/L (ref 0–32)
AST: 14 [IU]/L (ref 0–40)
Albumin: 4.2 g/dL (ref 3.8–4.9)
Alkaline Phosphatase: 137 [IU]/L — ABNORMAL HIGH (ref 44–121)
Bilirubin Total: 0.2 mg/dL (ref 0.0–1.2)
Bilirubin, Direct: <0.1 mg/dL (ref 0.00–0.40)
Total Protein: 7 g/dL (ref 6.0–8.5)

## 2022-03-09 LAB — VITAMIN D 25 HYDROXY (VIT D DEFICIENCY, FRACTURES): Vit D, 25-Hydroxy: 50.6 ng/mL (ref 30.0–100.0)

## 2022-03-09 NOTE — Progress Notes (Signed)
ALP ?

## 2022-03-12 LAB — ALKALINE PHOSPHATASE, ISOENZYMES
Alkaline Phosphatase: 135 IU/L — ABNORMAL HIGH (ref 44–121)
BONE FRACTION: 43 % (ref 14–68)
INTESTINAL FRAC.: 3 % (ref 0–18)
LIVER FRACTION: 54 % (ref 18–85)

## 2022-03-12 LAB — SPECIMEN STATUS REPORT

## 2022-03-13 ENCOUNTER — Other Ambulatory Visit: Payer: Medicare HMO

## 2022-03-14 ENCOUNTER — Encounter (HOSPITAL_COMMUNITY): Payer: Self-pay | Admitting: Psychiatry

## 2022-03-14 ENCOUNTER — Telehealth (HOSPITAL_BASED_OUTPATIENT_CLINIC_OR_DEPARTMENT_OTHER): Payer: Medicare HMO | Admitting: Psychiatry

## 2022-03-14 DIAGNOSIS — F332 Major depressive disorder, recurrent severe without psychotic features: Secondary | ICD-10-CM | POA: Diagnosis not present

## 2022-03-14 DIAGNOSIS — F4010 Social phobia, unspecified: Secondary | ICD-10-CM | POA: Diagnosis not present

## 2022-03-14 DIAGNOSIS — F900 Attention-deficit hyperactivity disorder, predominantly inattentive type: Secondary | ICD-10-CM

## 2022-03-14 MED ORDER — QUETIAPINE FUMARATE 100 MG PO TABS: 100.0000 mg | ORAL_TABLET | Freq: Every day | ORAL | 2 refills | Status: DC

## 2022-03-14 MED ORDER — BUSPIRONE HCL 30 MG PO TABS: 30.0000 mg | ORAL_TABLET | Freq: Two times a day (BID) | ORAL | 2 refills | Status: DC

## 2022-03-14 MED ORDER — VENLAFAXINE HCL ER 150 MG PO CP24: 150.0000 mg | ORAL_CAPSULE | Freq: Every day | ORAL | 2 refills | Status: DC

## 2022-03-14 MED ORDER — FLUOXETINE HCL 40 MG PO CAPS: 40.0000 mg | ORAL_CAPSULE | Freq: Every day | ORAL | 2 refills | Status: DC

## 2022-03-14 MED ORDER — AMPHETAMINE-DEXTROAMPHETAMINE 10 MG PO TABS: ORAL_TABLET | ORAL | 0 refills | Status: DC

## 2022-03-14 MED ORDER — BUSPIRONE HCL 30 MG PO TABS: 30.0000 mg | ORAL_TABLET | Freq: Every day | ORAL | 2 refills | Status: DC

## 2022-03-14 NOTE — Progress Notes (Signed)
Virtual Visit via Telephone Note ? ?I connected with Rebecca Andrade on 03/14/22 at  2:20 PM EDT by telephone and verified that I am speaking with the correct person using two identifiers. ? ?Location: ?Patient: Home ?Provider: Home Office ?  ?I discussed the limitations, risks, security and privacy concerns of performing an evaluation and management service by telephone and the availability of in person appointments. I also discussed with the patient that there may be a patient responsible charge related to this service. The patient expressed understanding and agreed to proceed. ? ? ?History of Present Illness: ?Patient is evaluated by phone session.  She is pleased that she has her own place and she has her dog back.  She tried to walk her dog every day.  She is trying to focus on her health.  She is worried because her alkaline phosphatase is slightly high and she is going to have another blood work in October.  Her doctor recommended if liver enzymes do not get better then she may need ultrasound.  She admitted to feeling overwhelmed about the news but denies any crying spells, feeling of hopelessness or worthlessness.  Her attention and concentration is okay.  She is happy that her son is doing well and continues to work at [employer].  Since she has her own place she has more freedom and she liked that.  She does not go outside at public places unless it is required.  She gets very anxious but lately her social anxiety is manageable.  Her appetite is okay.  She sleeps good.  She denies any tremors shakes or any EPS.  She denies any feeling of hopelessness or any suicidal thoughts. ? ? ?Past Psychiatric History: Reviewed. ?H/O taking antidepressants since 1990.  H/O inpatient twice, one in 2005 and in 2014. H/O cocaine use, paranoia, hallucination, mood swing, anger and mania. No h/o suicidal attempt. Tried Zoloft, Paxil, Lexapro, Wellbutrin, lithium, Remeron, Topamax, Abilify, Pristiq, Effexor, amitriptyline, Xanax,  temazepam, Valium and Cymbalta.  PCP prscribed Adderall for ADD.   ? ?Recent Results (from the past 2160 hour(s))  ?VITAMIN D 25 Hydroxy (Vit-D Deficiency, Fractures)     Status: None  ? Collection Time: 03/08/22 11:36 AM  ?Result Value Ref Range  ? Vit D, 25-Hydroxy 50.6 30.0 - 100.0 ng/mL  ?  Comment: Vitamin D deficiency has been defined by the Institute of ?Medicine and an Endocrine Society practice guideline as a ?level of serum 25-OH vitamin D less than 20 ng/mL (1,2). ?The Endocrine Society went on to further define vitamin D ?insufficiency as a level between 21 and 29 ng/mL (2). ?1. IOM (Institute of Medicine). 2010. Dietary reference ?   intakes for calcium and D. [place]: The ?   [employer]. ?2. Holick MF, Binkley [place], Bischoff-Ferrari HA, et al. ?   Evaluation, treatment, and prevention of vitamin D ?   deficiency: an Endocrine Society clinical practice ?   guideline. JCEM. 2011 Jul; 96(7):1911-30. ?  ?Hepatic function panel     Status: Abnormal  ? Collection Time: 03/08/22 11:36 AM  ?Result Value Ref Range  ? Total Protein 7.0 6.0 - 8.5 g/dL  ? Albumin 4.2 3.8 - 4.9 g/dL  ? Bilirubin Total 0.2 0.0 - 1.2 mg/dL  ? Bilirubin, Direct <0.10 0.00 - 0.40 mg/dL  ? Alkaline Phosphatase 137 (H) 44 - 121 IU/L  ? AST 14 0 - 40 IU/L  ? ALT 13 0 - 32 IU/L  ?Alkaline phosphatase, isoenzymes  Status: Abnormal  ? Collection Time: 03/08/22 11:36 AM  ?Result Value Ref Range  ? Alkaline Phosphatase 135 (H) 44 - 121 IU/L  ? LIVER FRACTION 54 18 - 85 %  ? BONE FRACTION 43 14 - 68 %  ? INTESTINAL FRAC. 3 0 - 18 %  ?Specimen status report     Status: None  ? Collection Time: 03/08/22 11:36 AM  ?Result Value Ref Range  ? specimen status report Comment   ?  Comment: Written Authorization ?Written Authorization ?Written Authorization Received. ?Authorization received from Signature on file 03-09-2022 ?Logged by Art Buff ?  ?  ?  ?Psychiatric Specialty Exam: ?Physical Exam  ?Review of Systems  ?Weight 199  lb (90.3 kg), last menstrual period 09/02/2018.There is no height or weight on file to calculate BMI.  ?General Appearance: NA  ?Eye Contact:  NA  ?Speech:  Slow  ?Volume:  Decreased  ?Mood:  Anxious  ?Affect:  NA  ?Thought Process:  Descriptions of Associations: Intact  ?Orientation:  Full (Time, Place, and Person)  ?Thought Content:  Rumination  ?Suicidal Thoughts:  No  ?Homicidal Thoughts:  No  ?Memory:  Immediate;   Fair ?Recent;   Fair ?Remote;   Fair  ?Judgement:  Intact  ?Insight:  Present  ?Psychomotor Activity:  NA  ?Concentration:  Concentration: Fair and Attention Span: Fair  ?Recall:  Fair  ?Fund of Knowledge:  Fair  ?Language:  Fair  ?Akathisia:  No  ?Handed:  Right  ?AIMS (if indicated):     ?Assets:  Communication Skills ?Desire for Improvement ?Housing ?Social Support ?Transportation  ?ADL's:  Intact  ?Cognition:  WNL  ?Sleep:   ok  ? ? ? ? ?Assessment and Plan: ?Major depressive disorder, recurrent.  Social anxiety disorder.  ADD, inattentive type. ? ?I reviewed blood work results.  She has mild elevation of alkaline phosphatase and she is going to have another blood work soon.  She is taking multiple psychotropic medication and very resistant to cut down the dose and number of pills.  I explained if her liver function test do not get better on her next blood work then we may need to lower the dose.  The patient not happy about it but agreed to work on it.  For now we will continue Adderall 20 mg in the morning, 20 mg in afternoon, Prozac 40 mg daily, Seroquel 100 mg at bedtime, BuSpar 30 mg daily and venlafaxine 150 mg daily.  Recommended to call us back if she has any question or any concern.  Follow-up in 3 months. ? ?Follow Up Instructions: ? ?  ?I discussed the assessment and treatment plan with the patient. The patient was provided an opportunity to ask questions and all were answered. The patient agreed with the plan and demonstrated an understanding of the instructions. ?  ?The patient was  advised to call back or seek an in-person evaluation if the symptoms worsen or if the condition fails to improve as anticipated. ? ?Collaboration of Care: Primary Care Provider AEB notes are in epic for review. ? ?Patient/Guardian was advised Release of Information must be obtained prior to any record release in order to collaborate their care with an outside provider. Patient/Guardian was advised if they have not already done so to contact the registration department to sign all necessary forms in order for [place] to release information regarding their care.  ? ?Consent: Patient/Guardian gives verbal consent for treatment and assignment of benefits for services provided during this visit. Patient/Guardian expressed  understanding and agreed to proceed.   ? ?I provided 24 minutes of non-face-to-face time during this encounter. ? ? ?Cleotis Nipper, MD  ?

## 2022-03-17 ENCOUNTER — Other Ambulatory Visit: Payer: Self-pay | Admitting: Medical

## 2022-04-18 ENCOUNTER — Other Ambulatory Visit: Payer: Self-pay | Admitting: Medical

## 2022-04-18 ENCOUNTER — Telehealth: Payer: Self-pay

## 2022-04-18 ENCOUNTER — Telehealth (HOSPITAL_COMMUNITY): Payer: Self-pay

## 2022-04-18 DIAGNOSIS — F900 Attention-deficit hyperactivity disorder, predominantly inattentive type: Secondary | ICD-10-CM

## 2022-04-18 MED ORDER — METHOCARBAMOL 500 MG PO TABS: 500.0000 mg | ORAL_TABLET | Freq: Two times a day (BID) | ORAL | 0 refills | Status: DC | PRN

## 2022-04-18 MED ORDER — AMPHETAMINE-DEXTROAMPHETAMINE 10 MG PO TABS: ORAL_TABLET | ORAL | 0 refills | Status: DC

## 2022-04-18 NOTE — Telephone Encounter (Signed)
Pt. Called stating she needs a refill on her Robaxin and linzess. I let her know her Linzess was already refilled on 03/20/22.

## 2022-04-18 NOTE — Telephone Encounter (Signed)
Medication refill request - Telephone message left for pt, that her message was received, requesting a new Adderall prescription be sent [facility]. Informed this request would be sent to Dr. Lolly Mustache. Last ordered 03/14/22 and pt returns on 06/13/22.

## 2022-04-18 NOTE — Telephone Encounter (Signed)
Medication management - Telephone call with patient to inform Dr. Lolly Mustache had sent in her requested new Adderall prescription to [facility] on [address]  Pt to call back if any issues filling when due.

## 2022-04-18 NOTE — Telephone Encounter (Signed)
Done

## 2022-04-23 ENCOUNTER — Other Ambulatory Visit: Payer: Self-pay | Admitting: Medical

## 2022-05-02 ENCOUNTER — Other Ambulatory Visit: Payer: Self-pay | Admitting: Medical

## 2022-05-03 ENCOUNTER — Telehealth: Payer: Self-pay

## 2022-05-03 NOTE — Telephone Encounter (Signed)
Pt. Called stating she wanted to talk to you or your nurse and I told her I could send you the message. She stated when she is dead asleep she struggles to get up. She stated it feels like someone is holding her down and I asked if she was awake or not and she said no it was a dream but then she wakes up after that. She wanted to know what caused that.

## 2022-05-07 ENCOUNTER — Other Ambulatory Visit: Payer: Self-pay | Admitting: Medical

## 2022-05-15 ENCOUNTER — Other Ambulatory Visit: Payer: Self-pay | Admitting: Medical

## 2022-05-15 DIAGNOSIS — Z1231 Encounter for screening mammogram for malignant neoplasm of breast: Secondary | ICD-10-CM

## 2022-05-16 ENCOUNTER — Ambulatory Visit: Payer: Medicare HMO | Admitting: Family Medicine

## 2022-05-18 ENCOUNTER — Telehealth (HOSPITAL_COMMUNITY): Payer: Self-pay | Admitting: *Deleted

## 2022-05-18 DIAGNOSIS — F900 Attention-deficit hyperactivity disorder, predominantly inattentive type: Secondary | ICD-10-CM

## 2022-05-18 MED ORDER — AMPHETAMINE-DEXTROAMPHETAMINE 10 MG PO TABS: ORAL_TABLET | ORAL | 0 refills | Status: DC

## 2022-05-18 NOTE — Telephone Encounter (Signed)
Done

## 2022-05-18 NOTE — Telephone Encounter (Signed)
Pt called requesting a refill of Adderall 10 mg. Last fill was on 04/19/22. Pt has an upcoming appointment on 06/13/22. Please review.

## 2022-05-21 ENCOUNTER — Other Ambulatory Visit (HOSPITAL_COMMUNITY): Payer: Self-pay | Admitting: Psychiatry

## 2022-05-21 DIAGNOSIS — F4010 Social phobia, unspecified: Secondary | ICD-10-CM

## 2022-05-22 ENCOUNTER — Ambulatory Visit (INDEPENDENT_AMBULATORY_CARE_PROVIDER_SITE_OTHER): Payer: Medicare HMO | Admitting: Medical

## 2022-05-22 ENCOUNTER — Ambulatory Visit
Admission: RE | Admit: 2022-05-22 | Discharge: 2022-05-22 | Disposition: A | Discharge status: Home / Self Care | Payer: Medicare HMO | Source: Ambulatory Visit | Attending: Medical | Admitting: Medical

## 2022-05-22 VITALS — BP 120/70 | HR 69 | Temp 97.6°F | Wt 198.2 lb

## 2022-05-22 DIAGNOSIS — M79604 Pain in right leg: Secondary | ICD-10-CM

## 2022-05-22 DIAGNOSIS — S8011XA Contusion of right lower leg, initial encounter: Secondary | ICD-10-CM

## 2022-05-22 DIAGNOSIS — M7989 Other specified soft tissue disorders: Secondary | ICD-10-CM | POA: Diagnosis not present

## 2022-05-22 NOTE — Patient Instructions (Signed)
My initial suspicion is that you may have bumped or bruised her leg without realizing it, but lets send you for x-ray just to rule out other issue    Please go to [facility] for your right leg xray.   Their hours are 8am - 4:30 pm Monday - Friday.  Take your insurance card with you.  [facility] [phone]  [address], [address] [place], [place] [address]  [address] [place], [place] [address]    Recommendations Avoid bumping your leg the next 2 days You can use Tylenol for pain every 4-6 hours for the next few days Consider cool pack such as bag of frozen peas over the tender spot on your right lower leg If this is just a bruise and this should calm down over the next week We will call with x-ray results
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
## 2022-05-23 ENCOUNTER — Other Ambulatory Visit: Payer: Self-pay | Admitting: Medical

## 2022-05-23 MED ORDER — PREDNISONE 10 MG PO TABS: ORAL_TABLET | ORAL | 0 refills | Status: DC

## 2022-05-24 ENCOUNTER — Other Ambulatory Visit: Payer: Self-pay | Admitting: Psychiatry

## 2022-05-24 ENCOUNTER — Other Ambulatory Visit: Payer: Self-pay | Admitting: Medical

## 2022-05-24 ENCOUNTER — Other Ambulatory Visit (HOSPITAL_COMMUNITY): Payer: Self-pay | Admitting: Psychiatry

## 2022-05-24 DIAGNOSIS — F4010 Social phobia, unspecified: Secondary | ICD-10-CM

## 2022-05-24 DIAGNOSIS — F332 Major depressive disorder, recurrent severe without psychotic features: Secondary | ICD-10-CM

## 2022-05-27 ENCOUNTER — Other Ambulatory Visit (HOSPITAL_COMMUNITY): Payer: Self-pay | Admitting: Psychiatry

## 2022-05-27 DIAGNOSIS — F332 Major depressive disorder, recurrent severe without psychotic features: Secondary | ICD-10-CM

## 2022-05-27 DIAGNOSIS — F4010 Social phobia, unspecified: Secondary | ICD-10-CM

## 2022-06-06 ENCOUNTER — Other Ambulatory Visit: Payer: Self-pay | Admitting: Medical

## 2022-06-08 ENCOUNTER — Ambulatory Visit: Payer: Medicare HMO

## 2022-06-08 ENCOUNTER — Other Ambulatory Visit (HOSPITAL_COMMUNITY): Payer: Self-pay | Admitting: *Deleted

## 2022-06-08 DIAGNOSIS — F4010 Social phobia, unspecified: Secondary | ICD-10-CM

## 2022-06-08 DIAGNOSIS — F332 Major depressive disorder, recurrent severe without psychotic features: Secondary | ICD-10-CM

## 2022-06-08 MED ORDER — QUETIAPINE FUMARATE 100 MG PO TABS: 100.0000 mg | ORAL_TABLET | Freq: Every day | ORAL | 0 refills | Status: DC

## 2022-06-08 MED ORDER — FLUOXETINE HCL 40 MG PO CAPS: 40.0000 mg | ORAL_CAPSULE | Freq: Every day | ORAL | 0 refills | Status: DC

## 2022-06-08 MED ORDER — BUSPIRONE HCL 30 MG PO TABS: 30.0000 mg | ORAL_TABLET | Freq: Every day | ORAL | 0 refills | Status: DC

## 2022-06-08 MED ORDER — VENLAFAXINE HCL ER 150 MG PO CP24: 150.0000 mg | ORAL_CAPSULE | Freq: Every day | ORAL | 0 refills | Status: DC

## 2022-06-13 ENCOUNTER — Telehealth (HOSPITAL_BASED_OUTPATIENT_CLINIC_OR_DEPARTMENT_OTHER): Payer: Medicare HMO | Admitting: Psychiatry

## 2022-06-13 ENCOUNTER — Encounter (HOSPITAL_COMMUNITY): Payer: Self-pay | Admitting: Psychiatry

## 2022-06-13 DIAGNOSIS — F332 Major depressive disorder, recurrent severe without psychotic features: Secondary | ICD-10-CM | POA: Diagnosis not present

## 2022-06-13 DIAGNOSIS — F900 Attention-deficit hyperactivity disorder, predominantly inattentive type: Secondary | ICD-10-CM | POA: Diagnosis not present

## 2022-06-13 DIAGNOSIS — F4010 Social phobia, unspecified: Secondary | ICD-10-CM

## 2022-06-13 MED ORDER — FLUOXETINE HCL 40 MG PO CAPS: 40.0000 mg | ORAL_CAPSULE | Freq: Every day | ORAL | 2 refills | Status: DC

## 2022-06-13 MED ORDER — QUETIAPINE FUMARATE 100 MG PO TABS: 100.0000 mg | ORAL_TABLET | Freq: Every day | ORAL | 2 refills | Status: DC

## 2022-06-13 MED ORDER — VENLAFAXINE HCL ER 150 MG PO CP24: 150.0000 mg | ORAL_CAPSULE | Freq: Every day | ORAL | 2 refills | Status: DC

## 2022-06-13 MED ORDER — AMPHETAMINE-DEXTROAMPHETAMINE 10 MG PO TABS: ORAL_TABLET | ORAL | 0 refills | Status: DC

## 2022-06-13 MED ORDER — BUSPIRONE HCL 30 MG PO TABS: 30.0000 mg | ORAL_TABLET | Freq: Every day | ORAL | 2 refills | Status: DC

## 2022-06-13 NOTE — Progress Notes (Signed)
Virtual Visit via Telephone Note  I connected with Rebecca Andrade on 06/13/22 at  2:20 PM EDT by telephone and verified that I am speaking with the correct person using two identifiers.  Location: Patient: Home Provider: Home Office   I discussed the limitations, risks, security and privacy concerns of performing an evaluation and management service by telephone and the availability of in person appointments. I also discussed with the patient that there may be a patient responsible charge related to this service. The patient expressed understanding and agreed to proceed.   History of Present Illness: Patient is evaluated by phone session.  She is doing well on her current medication.  She is taking all her medication as prescribed.  She sleeps good.  She has now her dog back and has her own place.  She is happy because her son is doing very well and taking her to the beach next month.  She denies any crying spells or any feeling of hopelessness or worthlessness.  Her attention concentration is okay.  She sleeps 7 to 8 hours.  She has no tremors, shakes or any EPS.  She still does not go outside to the public places unless it is important and required.  Her appetite is okay.  Her energy level is good.  She has appointment with her doctor in October to check her liver enzymes.  Patient denies drinking or using any illegal substances.    Past Psychiatric History: Reviewed. H/O taking antidepressants since 1990.  H/O inpatient twice, one in 2005 and in 2014. H/O cocaine use, paranoia, hallucination, mood swing, anger and mania. No h/o suicidal attempt. Tried Zoloft, Paxil, Lexapro, Wellbutrin, lithium, Remeron, Topamax, Abilify, Pristiq, Effexor, amitriptyline, Xanax, temazepam, Valium and Cymbalta.  PCP prscribed Adderall for ADD.     Psychiatric Specialty Exam: Physical Exam  Review of Systems  Weight 198 lb (89.8 kg), last menstrual period 09/02/2018.There is no height or weight on file to  calculate BMI.  General Appearance: NA  Eye Contact:  NA  Speech:  Slow  Volume:  Decreased  Mood:  Anxious  Affect:  NA  Thought Process:  Descriptions of Associations: Intact  Orientation:  Full (Time, Place, and Person)  Thought Content:  WDL  Suicidal Thoughts:  No  Homicidal Thoughts:  No  Memory:  Immediate;   Fair Recent;   Fair Remote;   Fair  Judgement:  Fair  Insight:  Present  Psychomotor Activity:  NA  Concentration:  Concentration: Fair and Attention Span: Fair  Recall:  [employer] of Knowledge:  Fair  Language:  Good  Akathisia:  No  Handed:  Right  AIMS (if indicated):     Assets:  Communication Skills Desire for Improvement Housing Resilience Social Support Transportation  ADL's:  Intact  Cognition:  WNL  Sleep:   ok      Assessment and Plan: Major depressive disorder, recurrent.  Social anxiety disorder.  ADD, inattentive type.  Patient is stable on her current medication.  Continue Adderall 20 mg in the morning and 20 mg in the afternoon, Prozac 40 mg daily, Seroquel 100 mg at bedtime, BuSpar 30 mg daily and venlafaxine 150 mg daily.  Discussed polypharmacy and we have tried cutting down the medication but patient very resistant because she is doing very well on current medication.  We will follow up the blood work results which is scheduled in October.  Patient has history of high liver enzymes.  Recommended to call us back if she  has any question or any concern.  Follow-up in 3 months.  Follow Up Instructions:    I discussed the assessment and treatment plan with the patient. The patient was provided an opportunity to ask questions and all were answered. The patient agreed with the plan and demonstrated an understanding of the instructions.   The patient was advised to call back or seek an in-person evaluation if the symptoms worsen or if the condition fails to improve as anticipated.  Collaboration of Care: Primary Care Provider AEB notes are  available in epic to review.  Patient/Guardian was advised Release of Information must be obtained prior to any record release in order to collaborate their care with an outside provider. Patient/Guardian was advised if they have not already done so to contact the registration department to sign all necessary forms in order for [place] to release information regarding their care.   Consent: Patient/Guardian gives verbal consent for treatment and assignment of benefits for services provided during this visit. Patient/Guardian expressed understanding and agreed to proceed.    I provided 16 minutes of non-face-to-face time during this encounter.   Cleotis Nipper, MD

## 2022-06-21 ENCOUNTER — Ambulatory Visit: Payer: Medicare HMO | Admitting: Psychiatry

## 2022-06-29 ENCOUNTER — Telehealth: Payer: Self-pay | Admitting: Medical

## 2022-06-29 ENCOUNTER — Other Ambulatory Visit: Payer: Self-pay | Admitting: Medical

## 2022-06-29 DIAGNOSIS — E782 Mixed hyperlipidemia: Secondary | ICD-10-CM

## 2022-06-29 MED ORDER — VITAMIN D (ERGOCALCIFEROL) 1.25 MG (50000 UNIT) PO CAPS: 50000.0000 [IU] | ORAL_CAPSULE | ORAL | 0 refills | Status: DC

## 2022-06-29 MED ORDER — ATORVASTATIN CALCIUM 20 MG PO TABS: 20.0000 mg | ORAL_TABLET | Freq: Every day | ORAL | 0 refills | Status: DC

## 2022-06-29 NOTE — Telephone Encounter (Signed)
PT CALLED AND is requesting a refill on her  Lipitor And her  vit d Please send to [facility]  [id number] [place] [place] - [address]

## 2022-06-29 NOTE — Telephone Encounter (Signed)
Waiting on response from shane

## 2022-06-30 ENCOUNTER — Telehealth: Payer: Self-pay

## 2022-06-30 ENCOUNTER — Other Ambulatory Visit: Payer: Self-pay | Admitting: Medical

## 2022-06-30 ENCOUNTER — Telehealth: Payer: Self-pay | Admitting: Psychiatry

## 2022-06-30 MED ORDER — METHOCARBAMOL 500 MG PO TABS: 500.0000 mg | ORAL_TABLET | Freq: Two times a day (BID) | ORAL | 0 refills | Status: DC | PRN

## 2022-06-30 NOTE — Telephone Encounter (Signed)
Pt. Called stating she needs a refill on her Robaxin 500mg  last apt was 05/22/22.

## 2022-06-30 NOTE — Telephone Encounter (Signed)
Pt request refill for propranolol ER (INDERAL LA) 60 MG [facility] hr [phone]

## 2022-07-03 MED ORDER — PROPRANOLOL HCL ER 60 MG PO CP24: ORAL_CAPSULE | ORAL | 1 refills | Status: DC

## 2022-07-03 NOTE — Telephone Encounter (Signed)
Called patient and advised Rx was for 6 months, should have med. She stated she has picked up 90 day supply but wants refill sent in. She has FU scheduled. I advised can refill again. Patient verbalized understanding, appreciation.

## 2022-07-07 ENCOUNTER — Other Ambulatory Visit (HOSPITAL_COMMUNITY): Payer: Self-pay | Admitting: Psychiatry

## 2022-07-07 DIAGNOSIS — F4010 Social phobia, unspecified: Secondary | ICD-10-CM

## 2022-07-14 ENCOUNTER — Ambulatory Visit: Payer: Medicare HMO

## 2022-07-15 ENCOUNTER — Other Ambulatory Visit: Payer: Self-pay | Admitting: Psychiatry

## 2022-07-19 ENCOUNTER — Telehealth (HOSPITAL_COMMUNITY): Payer: Self-pay | Admitting: *Deleted

## 2022-07-19 DIAGNOSIS — F900 Attention-deficit hyperactivity disorder, predominantly inattentive type: Secondary | ICD-10-CM

## 2022-07-19 MED ORDER — AMPHETAMINE-DEXTROAMPHETAMINE 10 MG PO TABS: ORAL_TABLET | ORAL | 0 refills | Status: DC

## 2022-07-19 NOTE — Telephone Encounter (Signed)
Done

## 2022-07-19 NOTE — Telephone Encounter (Signed)
Pt called requesting a refill of Adderall 10 mg, last filled 06/19/22. Pt has an upcoming appointment on 09/12/22. Please review.

## 2022-07-21 ENCOUNTER — Ambulatory Visit (INDEPENDENT_AMBULATORY_CARE_PROVIDER_SITE_OTHER): Payer: Medicare HMO

## 2022-07-21 VITALS — Ht 67.0 in | Wt 190.0 lb

## 2022-07-21 DIAGNOSIS — Z0000 Encounter for general adult medical examination without abnormal findings: Secondary | ICD-10-CM
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
## 2022-07-21 NOTE — Patient Instructions (Signed)
Rebecca Andrade , Thank you for taking time to come for your Medicare Wellness Visit. I appreciate your ongoing commitment to your health goals. Please review the following plan we discussed and let me know if I can assist you in the future.   Screening recommendations/referrals: Colonoscopy: completed 08/13/2014, due 08/13/2024 Mammogram: scheduled for 08/04/2022 Bone Density: n/a Recommended yearly ophthalmology/optometry visit for glaucoma screening and checkup Recommended yearly dental visit for hygiene and checkup  Vaccinations: Influenza vaccine: due Pneumococcal vaccine: n/a Tdap vaccine: completed 05/28/2018, due 05/28/2028 Shingles vaccine: discussed  Covid-19:  02/05/2020, 03/04/2020, 12/16/2020  Advanced directives: copy in chart  Conditions/risks identified: none  Next appointment: Follow up in one year for your annual wellness visit.   Preventive Care 40-64 Years, Female Preventive care refers to lifestyle choices and visits with your health care provider that can promote health and wellness. What does preventive care include? A yearly physical exam. This is also called an annual well check. Dental exams once or twice a year. Routine eye exams. Ask your health care provider how often you should have your eyes checked. Personal lifestyle choices, including: Daily care of your teeth and gums. Regular physical activity. Eating a healthy diet. Avoiding tobacco and drug use. Limiting alcohol use. Practicing safe sex. Taking low-dose aspirin daily starting at age 18. Taking vitamin and mineral supplements as recommended by your health care provider. What happens during an annual well check? The services and screenings done by your health care provider during your annual well check will depend on your age, overall health, lifestyle risk factors, and family history of disease. Counseling  Your health care provider may ask you questions about your: Alcohol use. Tobacco use. Drug  use. Emotional well-being. Home and relationship well-being. Sexual activity. Eating habits. Work and work Statistician. Method of birth control. Menstrual cycle. Pregnancy history. Screening  You may have the following tests or measurements: Height, weight, and BMI. Blood pressure. Lipid and cholesterol levels. These may be checked every 5 years, or more frequently if you are over 37 years old. Skin check. Lung cancer screening. You may have this screening every year starting at age 61 if you have a 30-pack-year history of smoking and currently smoke or have quit within the past 15 years. Fecal occult blood test (FOBT) of the stool. You may have this test every year starting at age 87. Flexible sigmoidoscopy or colonoscopy. You may have a sigmoidoscopy every 5 years or a colonoscopy every 10 years starting at age 3. Hepatitis C blood test. Hepatitis B blood test. Sexually transmitted disease (STD) testing. Diabetes screening. This is done by checking your blood sugar (glucose) after you have not eaten for a while (fasting). You may have this done every 1-3 years. Mammogram. This may be done every 1-2 years. Talk to your health care provider about when you should start having regular mammograms. This may depend on whether you have a family history of breast cancer. BRCA-related cancer screening. This may be done if you have a family history of breast, ovarian, tubal, or peritoneal cancers. Pelvic exam and Pap test. This may be done every 3 years starting at age 41. Starting at age 20, this may be done every 5 years if you have a Pap test in combination with an HPV test. Bone density scan. This is done to screen for osteoporosis. You may have this scan if you are at high risk for osteoporosis. Discuss your test results, treatment options, and if necessary, the need for more tests  with your health care provider. Vaccines  Your health care provider may recommend certain vaccines, such  as: Influenza vaccine. This is recommended every year. Tetanus, diphtheria, and acellular pertussis (Tdap, Td) vaccine. You may need a Td booster every 10 years. Zoster vaccine. You may need this after age 55. Pneumococcal 13-valent conjugate (PCV13) vaccine. You may need this if you have certain conditions and were not previously vaccinated. Pneumococcal polysaccharide (PPSV23) vaccine. You may need one or two doses if you smoke cigarettes or if you have certain conditions. Talk to your health care provider about which screenings and vaccines you need and how often you need them. This information is not intended to replace advice given to you by your health care provider. Make sure you discuss any questions you have with your health care provider. Document Released: 11/19/2015 Document Revised: 07/12/2016 Document Reviewed: 08/24/2015 Elsevier Interactive Patient Education  2017 [employer] Prevention in the Home Falls can cause injuries. They can happen to people of all ages. There are many things you can do to make your home safe and to help prevent falls. What can I do on the outside of my home? Regularly fix the edges of walkways and driveways and fix any cracks. Remove anything that might make you trip as you walk through a door, such as a raised step or threshold. Trim any bushes or trees on the path to your home. Use bright outdoor lighting. Clear any walking paths of anything that might make someone trip, such as rocks or tools. Regularly check to see if handrails are loose or broken. Make sure that both sides of any steps have handrails. Any raised decks and porches should have guardrails on the edges. Have any leaves, snow, or ice cleared regularly. Use sand or salt on walking paths during winter. Clean up any spills in your garage right away. This includes oil or grease spills. What can I do in the bathroom? Use night lights. Install grab bars by the toilet and in  the tub and shower. Do not use towel bars as grab bars. Use non-skid mats or decals in the tub or shower. If you need to sit down in the shower, use a plastic, non-slip stool. Keep the floor dry. Clean up any water that spills on the floor as soon as it happens. Remove soap buildup in the tub or shower regularly. Attach bath mats securely with double-sided non-slip rug tape. Do not have throw rugs and other things on the floor that can make you trip. What can I do in the bedroom? Use night lights. Make sure that you have a light by your bed that is easy to reach. Do not use any sheets or blankets that are too big for your bed. They should not hang down onto the floor. Have a firm chair that has side arms. You can use this for support while you get dressed. Do not have throw rugs and other things on the floor that can make you trip. What can I do in the kitchen? Clean up any spills right away. Avoid walking on wet floors. Keep items that you use a lot in easy-to-reach places. If you need to reach something above you, use a strong step stool that has a grab bar. Keep electrical cords out of the way. Do not use floor polish or wax that makes floors slippery. If you must use wax, use non-skid floor wax. Do not have throw rugs and other things on the  floor that can make you trip. What can I do with my stairs? Do not leave any items on the stairs. Make sure that there are handrails on both sides of the stairs and use them. Fix handrails that are broken or loose. Make sure that handrails are as long as the stairways. Check any carpeting to make sure that it is firmly attached to the stairs. Fix any carpet that is loose or worn. Avoid having throw rugs at the top or bottom of the stairs. If you do have throw rugs, attach them to the floor with carpet tape. Make sure that you have a light switch at the top of the stairs and the bottom of the stairs. If you do not have them, ask someone to add them  for you. What else can I do to help prevent falls? Wear shoes that: Do not have high heels. Have rubber bottoms. Are comfortable and fit you well. Are closed at the toe. Do not wear sandals. If you use a stepladder: Make sure that it is fully opened. Do not climb a closed stepladder. Make sure that both sides of the stepladder are locked into place. Ask someone to hold it for you, if possible. Clearly mark and make sure that you can see: Any grab bars or handrails. First and last steps. Where the edge of each step is. Use tools that help you move around (mobility aids) if they are needed. These include: Canes. Walkers. Scooters. Crutches. Turn on the lights when you go into a dark area. Replace any light bulbs as soon as they burn out. Set up your furniture so you have a clear path. Avoid moving your furniture around. If any of your floors are uneven, fix them. If there are any pets around you, be aware of where they are. Review your medicines with your doctor. Some medicines can make you feel dizzy. This can increase your chance of falling. Ask your doctor what other things that you can do to help prevent falls. This information is not intended to replace advice given to you by your health care provider. Make sure you discuss any questions you have with your health care provider. Document Released: 08/19/2009 Document Revised: 03/30/2016 Document Reviewed: 11/27/2014 Elsevier Interactive Patient Education  2017 [employer].

## 2022-07-24 ENCOUNTER — Telehealth: Payer: Self-pay | Admitting: *Deleted

## 2022-07-24 NOTE — Chronic Care Management (AMB) (Unsigned)
  Care Coordination  Outreach Note  07/24/2022 Name: Rebecca Andrade MRN: [id number] DOB: April 27, 1971   Care Coordination Outreach Attempts: An unsuccessful telephone outreach was attempted today to offer the patient information about available care coordination services as a benefit of their health plan.   Follow Up Plan:  Additional outreach attempts will be made to offer the patient care coordination information and services.   Encounter Outcome:  No Answer  [place]  Direct Dial: [phone]

## 2022-07-25 NOTE — Chronic Care Management (AMB) (Signed)
  Care Coordination   Note   07/25/2022 Name: Rebecca Andrade MRN: [id number] DOB: Nov 16, 1970  Rebecca Andrade is a 51 y.o. year old female who sees Tysinger, Camelia Eng, PA-C for primary care. I reached out to Rebecca Andrade by phone today to offer care coordination services.  [name] was given information about Care Coordination services today including:   The Care Coordination services include support from the care team which includes your Nurse Coordinator, Clinical Social Worker, or Pharmacist.  The Care Coordination team is here to help remove barriers to the health concerns and goals most important to you. Care Coordination services are voluntary, and the patient may decline or stop services at any time by request to their care team member.   Care Coordination Consent Status: Patient agreed to services and verbal consent obtained.   Follow up plan:  Telephone appointment with care coordination team member scheduled for:  07/26/22  Encounter Outcome:  Pt. Scheduled  [place]  Direct Dial: [phone]

## 2022-07-26 ENCOUNTER — Ambulatory Visit: Payer: Self-pay | Admitting: Licensed Clinical Social Worker

## 2022-08-04 ENCOUNTER — Ambulatory Visit
Admission: RE | Admit: 2022-08-04 | Discharge: 2022-08-04 | Disposition: A | Discharge status: Home / Self Care | Payer: Medicare HMO | Source: Ambulatory Visit | Attending: Medical | Admitting: Medical

## 2022-08-04 DIAGNOSIS — Z1231 Encounter for screening mammogram for malignant neoplasm of breast: Secondary | ICD-10-CM

## 2022-08-04 NOTE — Patient Outreach (Signed)
Care Coordination   Initial Visit Note   08/04/2022 Name: Rebecca Andrade MRN: [id number] DOB: 1971/07/06  Rebecca Andrade is a 51 y.o. year old female who sees Tysinger, Kermit Balo, PA-C for primary care. I spoke with  Rebecca Andrade by phone today.  What matters to the patients health and wellness today?  Grief Support    Goals Addressed             This Visit's Progress    Managment of MDD symptoms   On track    Care Coordination Interventions: Solution-Focused Strategies employed:  Active listening / Reflection utilized  Rebecca Andrade reports grief of aunt and brother within one year. Emotional Support Provided Rebecca Andrade was successful in identifying healthy coping skills. Rebecca Andrade participates in med management Participation in counseling encouraged. LCSW will mail contact information for Civil engineer, contracting for grief support Participation in support group encouraged. Rebecca Andrade is not interested in group therapy  Verbalization of feelings encouraged  LCSW discussed local crisis intervention resources         SDOH assessments and interventions completed:  No     Care Coordination Interventions Activated:  Yes  Care Coordination Interventions:  Yes, provided   Follow up plan: Follow up call scheduled for 08/07/22    Encounter Outcome:  Rebecca Andrade. Visit Completed   Jenel Lucks, MSW, LCSW [facility]  [facility] [email or web address] Phone [phone] 12:50 AM

## 2022-08-04 NOTE — Patient Instructions (Signed)
Visit Information  Thank you for taking time to visit with me today. Please don't hesitate to contact me if I can be of assistance to you.   Following are the goals we discussed today:   Goals Addressed             This Visit's Progress    Managment of MDD symptoms   On track    Care Coordination Interventions: Solution-Focused Strategies employed:  Active listening / Reflection utilized  Pt reports grief of aunt and brother within one year. Emotional Support Provided Pt was successful in identifying healthy coping skills. Pt participates in med management Participation in counseling encouraged. LCSW will mail contact information for Manufacturing engineer for grief support Participation in support group encouraged. Pt is not interested in group therapy  Verbalization of feelings encouraged  LCSW discussed local crisis intervention resources         Our next appointment is by telephone on 08/07/22   Please call the care guide team at [phone] if you need to cancel or reschedule your appointment.   If you are experiencing a Mental Health or [place] or need someone to talk to, please call the Suicide and Crisis Lifeline: 988 call 911   The patient verbalized understanding of instructions, educational materials, and care plan provided today and DECLINED offer to receive copy of patient instructions, educational materials, and care plan.   Christa See, MSW, [email or web address] Phone [phone] 12:50 AM

## 2022-08-07 ENCOUNTER — Ambulatory Visit: Payer: Self-pay | Admitting: Licensed Clinical Social Worker

## 2022-08-08 ENCOUNTER — Telehealth: Payer: Self-pay | Admitting: Psychiatry

## 2022-08-08 ENCOUNTER — Encounter: Payer: Self-pay | Admitting: Medical

## 2022-08-08 ENCOUNTER — Ambulatory Visit (INDEPENDENT_AMBULATORY_CARE_PROVIDER_SITE_OTHER): Payer: Medicare HMO | Admitting: Medical

## 2022-08-08 ENCOUNTER — Ambulatory Visit: Payer: Medicare HMO | Admitting: Psychiatry

## 2022-08-08 VITALS — BP 120/70 | HR 84 | Ht 67.25 in | Wt 191.4 lb

## 2022-08-08 DIAGNOSIS — G43909 Migraine, unspecified, not intractable, without status migrainosus: Secondary | ICD-10-CM | POA: Insufficient documentation

## 2022-08-08 DIAGNOSIS — N200 Calculus of kidney: Secondary | ICD-10-CM | POA: Diagnosis not present

## 2022-08-08 DIAGNOSIS — K5909 Other constipation: Secondary | ICD-10-CM

## 2022-08-08 DIAGNOSIS — F339 Major depressive disorder, recurrent, unspecified: Secondary | ICD-10-CM

## 2022-08-08 DIAGNOSIS — E7800 Pure hypercholesterolemia, unspecified: Secondary | ICD-10-CM | POA: Diagnosis not present

## 2022-08-08 DIAGNOSIS — Z972 Presence of dental prosthetic device (complete) (partial): Secondary | ICD-10-CM | POA: Diagnosis not present

## 2022-08-08 DIAGNOSIS — Z7185 Encounter for immunization safety counseling: Secondary | ICD-10-CM | POA: Diagnosis not present

## 2022-08-08 DIAGNOSIS — G479 Sleep disorder, unspecified: Secondary | ICD-10-CM | POA: Insufficient documentation

## 2022-08-08 DIAGNOSIS — Z780 Asymptomatic menopausal state: Secondary | ICD-10-CM | POA: Diagnosis not present

## 2022-08-08 DIAGNOSIS — Z87442 Personal history of urinary calculi: Secondary | ICD-10-CM

## 2022-08-08 DIAGNOSIS — R42 Dizziness and giddiness: Secondary | ICD-10-CM

## 2022-08-08 DIAGNOSIS — R748 Abnormal levels of other serum enzymes: Secondary | ICD-10-CM

## 2022-08-08 DIAGNOSIS — N3281 Overactive bladder: Secondary | ICD-10-CM

## 2022-08-08 DIAGNOSIS — Z0000 Encounter for general adult medical examination without abnormal findings: Secondary | ICD-10-CM

## 2022-08-08 DIAGNOSIS — Z2821 Immunization not carried out because of patient refusal: Secondary | ICD-10-CM

## 2022-08-08 DIAGNOSIS — N951 Menopausal and female climacteric states: Secondary | ICD-10-CM

## 2022-08-08 DIAGNOSIS — Z7189 Other specified counseling: Secondary | ICD-10-CM | POA: Insufficient documentation

## 2022-08-08 DIAGNOSIS — Z136 Encounter for screening for cardiovascular disorders: Secondary | ICD-10-CM | POA: Diagnosis not present

## 2022-08-08 DIAGNOSIS — G43809 Other migraine, not intractable, without status migrainosus: Secondary | ICD-10-CM

## 2022-08-08 DIAGNOSIS — I700 Atherosclerosis of aorta: Secondary | ICD-10-CM

## 2022-08-08 DIAGNOSIS — Z79899 Other long term (current) drug therapy: Secondary | ICD-10-CM

## 2022-08-08 DIAGNOSIS — M2550 Pain in unspecified joint: Secondary | ICD-10-CM

## 2022-08-08 LAB — POCT URINALYSIS DIP (PROADVANTAGE DEVICE)
Blood, UA: NEGATIVE
Glucose, UA: NEGATIVE mg/dL
Nitrite, UA: NEGATIVE
Specific Gravity, Urine: 1.025
Urobilinogen, Ur: NEGATIVE
pH, UA: 6 (ref 5.0–8.0)

## 2022-08-08 NOTE — Progress Notes (Unsigned)
   CC:  headaches  Follow-up Visit  Last visit: 01/30/22  Brief HPI: 51 year old female with a history of pineal cyst, kidney stones, depression, anxiety, ADHD who follows in clinic for migraines. 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  At her last visit, propranolol was increased to 60 mg daily. Diclofenac was started for rescue. Interval History: ***   Headache days per month: *** Headache free days per month: *** Headache severity: ***  Current Headache Regimen: Preventative: *** Abortive: ***  # of doses of abortive medications per month: ***  Prior Therapies                                  Effexor 150 mg daily Fluoxetine 40 mg daily Propranolol 60 mg daily Robaxin Nurtec Topamax contraindicated due to kidney stones Toradol diclofenac  Physical Exam:   Vital Signs: LMP 09/02/2018  GENERAL:  well appearing, in no acute distress, alert  SKIN:  Color, texture, turgor normal. No rashes or lesions HEAD:  Normocephalic/atraumatic. RESP: normal respiratory effort MSK:  No gross joint deformities.   NEUROLOGICAL: Mental Status: Alert, oriented to person, place and time, Follows commands, and Speech fluent and appropriate. Cranial Nerves: PERRL, face symmetric, no dysarthria, hearing grossly intact Motor: moves all extremities equally Gait: normal-based.  IMPRESSION: ***  PLAN: ***   Follow-up: ***  I spent a total of *** minutes on the date of the service. Headache education was done. Discussed lifestyle modification including increased oral hydration, decreased caffeine, exercise and stress management. Discussed treatment options including preventive and acute medications, natural supplements, and infusion therapy. Discussed medication overuse headache and to limit use of acute treatments to no more than 2 days/week or 10 days/month. Discussed medication side effects, adverse reactions and drug interactions. Written educational materials and  patient instructions outlining all of the above were given.  Genia Harold, MD
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
## 2022-08-08 NOTE — Patient Instructions (Signed)
This visit was a preventative care visit, also known as wellness visit or routine physical.   Topics typically include healthy lifestyle, diet, exercise, preventative care, vaccinations, sick and well care, proper use of emergency dept and after hours care, as well as other concerns.     Recommendations: Continue to return yearly for your annual wellness and preventative care visits.  This gives [place] a chance to discuss healthy lifestyle, exercise, vaccinations, review your chart record, and perform screenings where appropriate.  I recommend you see your eye doctor yearly for routine vision care.  I recommend you see your dentist yearly for routine dental care including hygiene visits twice yearly.   Vaccination recommendations were reviewed Immunization History  Administered Date(s) Administered   Influenza, Quadrivalent, Recombinant, Inj, Pf 08/16/2019   Influenza, Seasonal, Injecte, Preservative Fre 06/06/2016   Influenza,inj,Quad PF,6+ Mos 09/14/2018   Moderna Sars-Covid-2 Vaccination 02/05/2020, 03/04/2020, 12/16/2020   Tdap 05/28/2018   Declines flu shot  Shingles vaccine:  I recommend you have a shingles vaccine to help prevent shingles or herpes zoster outbreak.   Please call your insurer to inquire about coverage for the Shingrix vaccine given in 2 doses.   Some insurers cover this vaccine after age 39, some cover this after age 26.  If your insurer covers this, then call to schedule appointment to have this vaccine here.  Consider covid vaccine.   Screening for cancer: Colon cancer screening: I reviewed your colonoscopy on file that is up to date from 2015 Due 2025 repeat  Breast cancer screening: You should perform a self breast exam monthly.   We reviewed recommendations for regular mammograms and breast cancer screening.  Cervical cancer screening: We reviewed recommendations for pap smear screening.   Skin cancer screening: Check your skin regularly for new  changes, growing lesions, or other lesions of concern Come in for evaluation if you have skin lesions of concern.  Lung cancer screening: If you have a greater than 20 pack year history of tobacco use, then you may qualify for lung cancer screening with a chest CT scan.   Please call your insurance company to inquire about coverage for this test.  We currently don't have screenings for other cancers besides breast, cervical, colon, and lung cancers.  If you have a strong family history of cancer or have other cancer screening concerns, please let me know.    Bone health: Get at least 150 minutes of aerobic exercise weekly Get weight bearing exercise at least once weekly Bone density test:  A bone density test is an imaging test that uses a type of X-ray to measure the amount of calcium and other minerals in your bones. The test may be used to diagnose or screen you for a condition that causes weak or thin bones (osteoporosis), predict your risk for a broken bone (fracture), or determine how well your osteoporosis treatment is working. The bone density test is recommended for females 25 and older, or females or males [id number] if certain risk factors such as thyroid disease, long term use of steroids such as for asthma or rheumatological issues, vitamin D deficiency, estrogen deficiency, family history of osteoporosis, self or family history of fragility fracture in first degree relative.    Heart health: Get at least 150 minutes of aerobic exercise weekly Limit alcohol It is important to maintain a healthy blood pressure and healthy cholesterol numbers  Heart disease screening: Screening for heart disease includes screening for blood pressure, fasting lipids, glucose/diabetes screening, BMI height  to weight ratio, reviewed of smoking status, physical activity, and diet.    Goals include blood pressure 120/80 or less, maintaining a healthy lipid/cholesterol profile, preventing diabetes or keeping  diabetes numbers under good control, not smoking or using tobacco products, exercising most days per week or at least 150 minutes per week of exercise, and eating healthy variety of fruits and vegetables, healthy oils, and avoiding unhealthy food choices like fried food, fast food, high sugar and high cholesterol foods.    Other tests may possibly include EKG test, CT coronary calcium score, echocardiogram, exercise treadmill stress test.   I reviewed chest CT from 03/2022 showing aortic atherosclerosis.   Medical care options: I recommend you continue to seek care here first for routine care.  We try really hard to have available appointments Monday through Friday daytime hours for sick visits, acute visits, and physicals.  Urgent care should be used for after hours and weekends for significant issues that cannot wait till the next day.  The emergency department should be used for significant potentially life-threatening emergencies.  The emergency department is expensive, can often have long wait times for less significant concerns, so try to utilize primary care, urgent care, or telemedicine when possible to avoid unnecessary trips to the emergency department.  Virtual visits and telemedicine have been introduced since the pandemic started in 2020, and can be convenient ways to receive medical care.  We offer virtual appointments as well to assist you in a variety of options to seek medical care.   Advanced Directives: I recommend you consider completing a [place] and Living Will.   These documents respect your wishes and help alleviate burdens on your loved ones if you were to become terminally ill or be in a position to need those documents enforced.    You can complete Advanced Directives yourself, have them notarized, then have copies made for our office, for you and for anybody you feel should have them in safe keeping.  Or, you can have an attorney prepare these  documents.   If you haven't updated your Last Will and Testament in a while, it may be worthwhile having an attorney prepare these documents together and save on some costs.       Specific Issues: Aortic athoerscloerosis -continue cholesterol medicine and eat a low-cholesterol diet  Kidney stones -we will have her sign to get last urology records  ADD, depression-sees psychiatry  History of migraines, sleep issues-follow-up with neurology as planned tomorrow  Vertigo-occasionally, uses meclizine as needed  Alkaline phosphatase elevation-I reviewed labs we did this year.  Continue vitamin D supplement which seems to be the source of her elevation  Overactive bladder-no recent complaints, continue oxybutynin  Chronic constipation-doing okay on Linzess  OSA -chart shows history of sleep apnea but I do not see a prior sleep study on file.  She sees neurology regarding sleep

## 2022-08-08 NOTE — Telephone Encounter (Signed)
Pt cancelled appt due to having a flat tire.

## 2022-08-09 ENCOUNTER — Ambulatory Visit: Payer: Medicare HMO | Admitting: Psychiatry

## 2022-08-09 ENCOUNTER — Other Ambulatory Visit: Payer: Self-pay | Admitting: Medical

## 2022-08-09 VITALS — BP 129/83 | HR 82 | Ht 67.0 in | Wt 196.1 lb

## 2022-08-09 DIAGNOSIS — G43019 Migraine without aura, intractable, without status migrainosus: Secondary | ICD-10-CM

## 2022-08-09 DIAGNOSIS — Z8639 Personal history of other endocrine, nutritional and metabolic disease: Secondary | ICD-10-CM

## 2022-08-09 DIAGNOSIS — E782 Mixed hyperlipidemia: Secondary | ICD-10-CM

## 2022-08-09 LAB — LIPID PANEL
Chol/HDL Ratio: 2.5 ratio (ref 0.0–4.4)
Cholesterol, Total: 155 mg/dL (ref 100–199)
HDL: 61 mg/dL (ref >39)
LDL Chol Calc (NIH): 78 mg/dL (ref 0–99)
Triglycerides: 83 mg/dL (ref 0–149)
VLDL Cholesterol Cal: 16 mg/dL (ref 5–40)

## 2022-08-09 LAB — CBC
Hematocrit: 42.3 % (ref 34.0–46.6)
Hemoglobin: 14.2 g/dL (ref 11.1–15.9)
MCH: 30.2 pg (ref 26.6–33.0)
MCHC: 33.6 g/dL (ref 31.5–35.7)
MCV: 90 fL (ref 79–97)
Platelets: 291 10*3/uL (ref 150–450)
RBC: 4.7 x10E6/uL (ref 3.77–5.28)
RDW: 12 % (ref 11.7–15.4)
WBC: 9 10*3/uL (ref 3.4–10.8)

## 2022-08-09 LAB — COMPREHENSIVE METABOLIC PANEL
ALT: 11 IU/L (ref 0–32)
AST: 17 IU/L (ref 0–40)
Albumin/Globulin Ratio: 1.6 (ref 1.2–2.2)
Albumin: 4.6 g/dL (ref 3.8–4.9)
Alkaline Phosphatase: 127 IU/L — ABNORMAL HIGH (ref 44–121)
BUN/Creatinine Ratio: 21 (ref 9–23)
BUN: 18 mg/dL (ref 6–24)
Bilirubin Total: 0.5 mg/dL (ref 0.0–1.2)
CO2: 20 mmol/L (ref 20–29)
Calcium: 9.6 mg/dL (ref 8.7–10.2)
Chloride: 106 mmol/L (ref 96–106)
Creatinine, Ser: 0.85 mg/dL (ref 0.57–1.00)
Globulin, Total: 2.8 g/dL (ref 1.5–4.5)
Glucose: 89 mg/dL (ref 70–99)
Potassium: 4.2 mmol/L (ref 3.5–5.2)
Sodium: 142 mmol/L (ref 134–144)
Total Protein: 7.4 g/dL (ref 6.0–8.5)
eGFR: 83 mL/min/{1.73_m2} (ref >59)

## 2022-08-09 LAB — URIC ACID: Uric Acid: 4.7 mg/dL (ref 3.0–7.2)

## 2022-08-09 MED ORDER — PROMETHAZINE HCL 25 MG PO TABS: 25.0000 mg | ORAL_TABLET | Freq: Three times a day (TID) | ORAL | 6 refills | Status: DC | PRN

## 2022-08-09 MED ORDER — LINACLOTIDE 290 MCG PO CAPS: ORAL_CAPSULE | ORAL | 3 refills | Status: DC

## 2022-08-09 MED ORDER — DICLOFENAC POTASSIUM 50 MG PO TABS: ORAL_TABLET | ORAL | 6 refills | Status: DC

## 2022-08-09 MED ORDER — ATORVASTATIN CALCIUM 20 MG PO TABS: 20.0000 mg | ORAL_TABLET | Freq: Every day | ORAL | 3 refills | Status: DC

## 2022-08-09 MED ORDER — VITAMIN D (ERGOCALCIFEROL) 1.25 MG (50000 UNIT) PO CAPS: 50000.0000 [IU] | ORAL_CAPSULE | ORAL | 1 refills | Status: DC

## 2022-08-09 MED ORDER — METHOCARBAMOL 500 MG PO TABS: 500.0000 mg | ORAL_TABLET | Freq: Two times a day (BID) | ORAL | 0 refills | Status: DC | PRN

## 2022-08-09 MED ORDER — PROPRANOLOL HCL ER 80 MG PO CP24: 80.0000 mg | ORAL_CAPSULE | Freq: Every day | ORAL | 6 refills | Status: DC

## 2022-08-14 ENCOUNTER — Other Ambulatory Visit (HOSPITAL_COMMUNITY): Payer: Self-pay | Admitting: Psychiatry

## 2022-08-14 DIAGNOSIS — F4010 Social phobia, unspecified: Secondary | ICD-10-CM

## 2022-08-14 DIAGNOSIS — F332 Major depressive disorder, recurrent severe without psychotic features: Secondary | ICD-10-CM

## 2022-08-14 NOTE — Patient Outreach (Signed)
Care Coordination   Follow Up Visit Note   08/14/2022 Name: Rebecca Andrade MRN: [id number] DOB: [birth year]  Rebecca Andrade is a 51 y.o. year old female who sees Tysinger, Kermit Balo, [place] for primary care. I spoke with  Rebecca Andrade by phone today.  What matters to the patients health and wellness today?  Pt requested a call back later in the day. LCSW made an attempt at 3:59 PM and left a detailed message for a return call  SDOH assessments and interventions completed:  No     Care Coordination Interventions Activated:  No  Care Coordination Interventions:  No, not indicated   Follow up plan:  LCSW will await for a return call    Encounter Outcome:  Pt. Request to Call Back   Jenel Lucks, MSW, LCSW [facility] Care Management [facility]  [email or web address] Phone [phone] 10:41 AM'

## 2022-08-21 ENCOUNTER — Telehealth (HOSPITAL_COMMUNITY): Payer: Self-pay

## 2022-08-21 DIAGNOSIS — F900 Attention-deficit hyperactivity disorder, predominantly inattentive type: Secondary | ICD-10-CM

## 2022-08-21 MED ORDER — AMPHETAMINE-DEXTROAMPHETAMINE 10 MG PO TABS: ORAL_TABLET | ORAL | 0 refills | Status: DC

## 2022-08-21 NOTE — Telephone Encounter (Signed)
  Patient called stating that she has been without her medication all weekend requesting a refill   Disp Refills Start End   amphetamine-dextroamphetamine (ADDERALL) 10 MG tablet 120 tablet 0 07/19/2022    Sig: Take 2 in am and 2 at noon   Sent to pharmacy as: amphetamine-dextroamphetamine (ADDERALL) 10 MG tablet   Earliest Fill Date: 07/19/2022   Notes to Pharmacy: Do not refill refill until 07/20/2022   E-Prescribing Status: Receipt confirmed by pharmacy (07/19/2022  4:41 PM EDT)

## 2022-08-21 NOTE — Telephone Encounter (Signed)
Done

## 2022-08-28 ENCOUNTER — Other Ambulatory Visit (HOSPITAL_COMMUNITY): Payer: Self-pay | Admitting: Psychiatry

## 2022-08-28 DIAGNOSIS — F332 Major depressive disorder, recurrent severe without psychotic features: Secondary | ICD-10-CM

## 2022-08-28 DIAGNOSIS — F4010 Social phobia, unspecified: Secondary | ICD-10-CM

## 2022-09-01 ENCOUNTER — Other Ambulatory Visit (HOSPITAL_COMMUNITY): Payer: Self-pay | Admitting: *Deleted

## 2022-09-01 ENCOUNTER — Telehealth (HOSPITAL_COMMUNITY): Payer: Self-pay | Admitting: *Deleted

## 2022-09-01 DIAGNOSIS — F4010 Social phobia, unspecified: Secondary | ICD-10-CM

## 2022-09-01 DIAGNOSIS — F332 Major depressive disorder, recurrent severe without psychotic features: Secondary | ICD-10-CM

## 2022-09-01 MED ORDER — VENLAFAXINE HCL ER 150 MG PO CP24: 150.0000 mg | ORAL_CAPSULE | Freq: Every day | ORAL | 0 refills | Status: DC

## 2022-09-01 NOTE — Telephone Encounter (Signed)
Pt called requesting refill of Effexor XR 150 mg.

## 2022-09-02 ENCOUNTER — Other Ambulatory Visit (HOSPITAL_COMMUNITY): Payer: Self-pay | Admitting: Psychiatry

## 2022-09-02 DIAGNOSIS — F4010 Social phobia, unspecified: Secondary | ICD-10-CM

## 2022-09-04 ENCOUNTER — Other Ambulatory Visit (HOSPITAL_COMMUNITY): Payer: Self-pay | Admitting: Psychiatry

## 2022-09-04 ENCOUNTER — Other Ambulatory Visit (HOSPITAL_COMMUNITY): Payer: Self-pay | Admitting: *Deleted

## 2022-09-04 DIAGNOSIS — F332 Major depressive disorder, recurrent severe without psychotic features: Secondary | ICD-10-CM

## 2022-09-04 MED ORDER — QUETIAPINE FUMARATE 100 MG PO TABS: 100.0000 mg | ORAL_TABLET | Freq: Every day | ORAL | 0 refills | Status: DC

## 2022-09-11 ENCOUNTER — Ambulatory Visit (INDEPENDENT_AMBULATORY_CARE_PROVIDER_SITE_OTHER): Payer: Medicare HMO | Admitting: Medical

## 2022-09-11 ENCOUNTER — Other Ambulatory Visit: Payer: Self-pay | Admitting: *Deleted

## 2022-09-11 VITALS — BP 110/70 | HR 78 | Temp 97.9°F | Wt 192.0 lb

## 2022-09-11 DIAGNOSIS — L03313 Cellulitis of chest wall: Secondary | ICD-10-CM

## 2022-09-11 MED ORDER — SULFAMETHOXAZOLE-TRIMETHOPRIM 800-160 MG PO TABS: 1.0000 | ORAL_TABLET | Freq: Two times a day (BID) | ORAL | 0 refills | Status: DC

## 2022-09-11 MED ORDER — MUPIROCIN 2 % EX OINT: 1.0000 | TOPICAL_OINTMENT | Freq: Two times a day (BID) | CUTANEOUS | 0 refills | Status: DC

## 2022-09-11 NOTE — Progress Notes (Signed)
Subjective:  Rebecca Andrade is a 51 y.o. female who presents for Chief Complaint  Patient presents with   bump on side    Bump on side- x couple weeks. Very painful and red     Here for a red bump on the right torso.  Started as a small bump but is gotten more red and larger and more tender.  No drainage.  No fever.  No body aches or chills.  No nausea or vomiting.  No prior similar.  No history of MRSA.  She has had abscess of boils in the past.  No current household contacts with similar.  No other aggravating or relieving factors.    No other c/o.  The following portions of the patient's history were reviewed and updated as appropriate: allergies, current medications, past family history, past medical history, past social history, past surgical history and problem list.  ROS Otherwise as in subjective above  Objective: BP 110/70   Pulse 78   Temp 97.9 F (36.6 C)   Wt 192 lb (87.1 kg)   LMP 09/02/2018   SpO2 98%   BMI 30.07 kg/m   General appearance: alert, no distress, well developed, well nourished Skin: Right lateral chest wall midline with a 1 cm raised erythematous papular lesion suggestive of cellulitis.  Tender to touch.  No induration or fluctuance.    Assessment: Encounter Diagnosis  Name Primary?   Cellulitis of chest wall Yes     Plan: We discussed symptoms and concerns.  Exam suggest cellulitis.  No obvious abscess today.  We discussed the potential for MRSA given the nature of the skin finding.  Begin topical mupirocin in each nostril for the next week along with oral Bactrim.  Can use warm moist compresses.  If not fully resolved or much improved this week then call or recheck.  If much worse in the short-term them come back in  Rebecca Andrade was seen today for bump on side.  Diagnoses and all orders for this visit:  Cellulitis of chest wall  Other orders -     sulfamethoxazole-trimethoprim (BACTRIM DS) 800-160 MG tablet; Take 1 tablet by mouth 2 (two) times  daily. -     mupirocin ointment (BACTROBAN) 2 %; Apply 1 Application topically 2 (two) times daily. Each nostril    Follow up: prn

## 2022-09-12 ENCOUNTER — Telehealth (HOSPITAL_BASED_OUTPATIENT_CLINIC_OR_DEPARTMENT_OTHER): Payer: Medicare HMO | Admitting: Psychiatry

## 2022-09-12 ENCOUNTER — Encounter (HOSPITAL_COMMUNITY): Payer: Self-pay | Admitting: Psychiatry

## 2022-09-12 ENCOUNTER — Telehealth: Payer: Self-pay | Admitting: *Deleted

## 2022-09-12 DIAGNOSIS — F332 Major depressive disorder, recurrent severe without psychotic features: Secondary | ICD-10-CM

## 2022-09-12 DIAGNOSIS — F4010 Social phobia, unspecified: Secondary | ICD-10-CM | POA: Diagnosis not present

## 2022-09-12 DIAGNOSIS — F900 Attention-deficit hyperactivity disorder, predominantly inattentive type: Secondary | ICD-10-CM

## 2022-09-12 MED ORDER — AMPHETAMINE-DEXTROAMPHETAMINE 10 MG PO TABS: ORAL_TABLET | ORAL | 0 refills | Status: DC

## 2022-09-12 MED ORDER — QUETIAPINE FUMARATE 100 MG PO TABS: 100.0000 mg | ORAL_TABLET | Freq: Every day | ORAL | 2 refills | Status: DC

## 2022-09-12 MED ORDER — BUSPIRONE HCL 30 MG PO TABS: 30.0000 mg | ORAL_TABLET | Freq: Two times a day (BID) | ORAL | 2 refills | Status: DC

## 2022-09-12 MED ORDER — FLUOXETINE HCL 40 MG PO CAPS: 40.0000 mg | ORAL_CAPSULE | Freq: Every day | ORAL | 2 refills | Status: DC

## 2022-09-12 MED ORDER — VENLAFAXINE HCL ER 150 MG PO CP24: 150.0000 mg | ORAL_CAPSULE | Freq: Every day | ORAL | 2 refills | Status: DC

## 2022-09-12 NOTE — Progress Notes (Signed)
Virtual Visit via Telephone Note  I connected with Rebecca Andrade on 09/12/22 at  2:40 PM EST by telephone and verified that I am speaking with the correct person using two identifiers.  Location: Patient: In Car Provider: Home Office   I discussed the limitations, risks, security and privacy concerns of performing an evaluation and management service by telephone and the availability of in person appointments. I also discussed with the patient that there may be a patient responsible charge related to this service. The patient expressed understanding and agreed to proceed.   History of Present Illness: Patient is evaluated by phone session.  She reported lately feeling sad because she has another funeral to attend in [place].  Patient told her 3 year old niece died in [place] by taking an overdose on fentanyl.  Patient believed her boyfriend may have given fentanyl.  They have 6 children and patient is very sad about this situation.  She admitted having crying spells and ruminative thoughts but denies any suicidal thoughts or homicidal thoughts.  She endorses does not like to do things lately and stated to herself.  She does not go to public places, grocery stores because she feels nervous and anxious.  Recently she has seen neurology and PCP.  Her headaches are stable.  She had blood work and her liver enzymes are stable other than mild elevation of alkaline phosphatase.  She denies any tremors or shakes or any EPS.  Her attention concentration is fair.  She denies any mania, psychosis or any hallucination.  Her appetite is okay.  Her weight is stable.  She denies drinking or using any illegal substances.  We have recommended to see a therapist and patient had call one place but had not heard from them.  Now she is interested to get more referrals.  Past Psychiatric History: Reviewed. H/O taking antidepressants since 1990.  H/O inpatient twice, one in 2005 and in 2014. H/O cocaine use,  paranoia, hallucination, mood swing, anger and mania. No h/o suicidal attempt. Tried Zoloft, Paxil, Lexapro, Wellbutrin, lithium, Remeron, Topamax, Abilify, Pristiq, Effexor, amitriptyline, Xanax, temazepam, Valium and Cymbalta.  PCP prscribed Adderall for ADD.    Recent Results (from the past 2160 hour(s))  Uric acid     Status: None   Collection Time: 08/08/22  1:50 PM  Result Value Ref Range   Uric Acid 4.7 3.0 - 7.2 mg/dL    Comment:            Therapeutic target for gout patients: <6.0  Comprehensive metabolic panel     Status: Abnormal   Collection Time: 08/08/22  1:51 PM  Result Value Ref Range   Glucose 89 70 - 99 mg/dL   BUN 18 6 - 24 mg/dL   Creatinine, Ser 0.85 0.57 - 1.00 mg/dL   eGFR 83 >59 mL/min/1.73   BUN/Creatinine Ratio 21 9 - 23   Sodium 142 134 - 144 mmol/L   Potassium 4.2 3.5 - 5.2 mmol/L   Chloride 106 96 - 106 mmol/L   CO2 20 20 - 29 mmol/L   Calcium 9.6 8.7 - 10.2 mg/dL   Total Protein 7.4 6.0 - 8.5 g/dL   Albumin 4.6 3.8 - 4.9 g/dL   Globulin, Total 2.8 1.5 - 4.5 g/dL   Albumin/Globulin Ratio 1.6 1.2 - 2.2   Bilirubin Total 0.5 0.0 - 1.2 mg/dL   Alkaline Phosphatase 127 (H) 44 - 121 IU/L   AST 17 0 - 40 IU/L   ALT 11 0 -  32 IU/L  CBC     Status: None   Collection Time: 08/08/22  1:51 PM  Result Value Ref Range   WBC 9.0 3.4 - 10.8 x10E3/uL   RBC 4.70 3.77 - 5.28 x10E6/uL   Hemoglobin 14.2 11.1 - 15.9 g/dL   Hematocrit 42.3 34.0 - 46.6 %   MCV 90 79 - 97 fL   MCH 30.2 26.6 - 33.0 pg   MCHC 33.6 31.5 - 35.7 g/dL   RDW 12.0 11.7 - 15.4 %   Platelets 291 150 - 450 x10E3/uL  Lipid panel     Status: None   Collection Time: 08/08/22  1:51 PM  Result Value Ref Range   Cholesterol, Total 155 100 - 199 mg/dL   Triglycerides 83 0 - 149 mg/dL   HDL 61 >39 mg/dL   VLDL Cholesterol Cal 16 5 - 40 mg/dL   LDL Chol Calc (NIH) 78 0 - 99 mg/dL   Chol/HDL Ratio 2.5 0.0 - 4.4 ratio    Comment:                                   T. Chol/HDL Ratio                                              Men  Women                               1/2 Avg.Risk  3.4    3.3                                   Avg.Risk  5.0    4.4                                2X Avg.Risk  9.6    7.1                                3X Avg.Risk 23.4   11.0   POCT Urinalysis DIP (Proadvantage Device)     Status: Abnormal   Collection Time: 08/08/22  2:00 PM  Result Value Ref Range   Color, UA yellow yellow   Clarity, UA clear clear   Glucose, UA negative negative mg/dL   Bilirubin, UA moderate (A) negative   Ketones, POC UA small (15) (A) negative mg/dL   Specific Gravity, Urine 1.025    Blood, UA negative negative   pH, UA 6.0 5.0 - 8.0   Protein Ur, POC trace (A) negative mg/dL   Urobilinogen, Ur n    Nitrite, UA Negative Negative   Leukocytes, UA Small (1+) (A) Negative      Psychiatric Specialty Exam: Physical Exam  Review of Systems  Weight 192 lb (87.1 kg), last menstrual period 09/02/2018.There is no height or weight on file to calculate BMI.  General Appearance: NA  Eye Contact:  NA  Speech:  Slow  Volume:  Decreased  Mood:  Anxious and Dysphoric  Affect:  NA  Thought Process:  Descriptions of Associations: Intact  Orientation:  Full (Time,  Place, and Person)  Thought Content:  Rumination  Suicidal Thoughts:  No  Homicidal Thoughts:  No  Memory:  Immediate;   Fair Recent;   Fair Remote;   Fair  Judgement:  Fair  Insight:  Present  Psychomotor Activity:  NA  Concentration:  Concentration: Fair and Attention Span: Fair  Recall:  [employer] of Knowledge:  Fair  Language:  Fair  Akathisia:  No  Handed:  Right  AIMS (if indicated):     Assets:  Communication Skills Desire for Improvement Housing Social Support Transportation  ADL's:  Intact  Cognition:  WNL  Sleep:   ok      Assessment and Plan: Major depressive disorder, recurrent.  Social anxiety disorder.  ADD, inattentive type.  Recommend to see a therapist as patient has lost family members  this year and has been very sad.  Her only complaint is her dog.  I reviewed blood work results.  Her glucoses normal.  Her alkaline phosphatase is mildly elevated but stable.  We discussed polypharmacy but patient feels that she need these medication to manage her symptoms.  So far she has no side effects including tremor or shakes or any EPS.  Continue Seroquel 100 mg at bedtime, BuSpar 30 mg daily, venlafaxine 150 mg daily, Prozac 40 mg daily and Adderall 10 mg she takes 2 pills in the morning and 2 pills in the afternoon.  Discussed medication side effects and benefits.  Recommended to call us back if there is any question or any concern.  Follow-up in 3 months.  Follow Up Instructions:    I discussed the assessment and treatment plan with the patient. The patient was provided an opportunity to ask questions and all were answered. The patient agreed with the plan and demonstrated an understanding of the instructions.   The patient was advised to call back or seek an in-person evaluation if the symptoms worsen or if the condition fails to improve as anticipated.  Collaboration of Care: Other provider involved in patient's care AEB notes are available in epic to review.  Patient/Guardian was advised Release of Information must be obtained prior to any record release in order to collaborate their care with an outside provider. Patient/Guardian was advised if they have not already done so to contact the registration department to sign all necessary forms in order for [place] to release information regarding their care.   Consent: Patient/Guardian gives verbal consent for treatment and assignment of benefits for services provided during this visit. Patient/Guardian expressed understanding and agreed to proceed.    I provided 23 minutes of non-face-to-face time during this encounter.   Kathlee Nations, MD

## 2022-09-12 NOTE — Progress Notes (Signed)
Is she taking both mexolicam and diclofenac for her migraines? She should only take one or the other since they are both NSAIDs

## 2022-09-12 NOTE — Telephone Encounter (Signed)
Received refill request for meloxicam. Per Dr Billey Gosling:  Is she taking both mexolicam and diclofenac for her migraines? She should only take one or the other since they are both NSAIDs  Called patient and advised her of MD message.. She stated she would stop taking meloxicam, I discontinued the Rx.  Patient verbalized understanding, appreciation.

## 2022-09-13 ENCOUNTER — Other Ambulatory Visit: Payer: Self-pay | Admitting: Psychiatry

## 2022-09-18 ENCOUNTER — Ambulatory Visit (INDEPENDENT_AMBULATORY_CARE_PROVIDER_SITE_OTHER): Payer: Medicare HMO | Admitting: Medical

## 2022-09-18 VITALS — BP 110/70 | HR 73 | Temp 97.1°F

## 2022-09-18 DIAGNOSIS — L02213 Cutaneous abscess of chest wall: Secondary | ICD-10-CM | POA: Diagnosis not present

## 2022-09-18 MED ORDER — SILVER SULFADIAZINE 1 % EX CREA: 1.0000 | TOPICAL_CREAM | Freq: Every day | CUTANEOUS | 0 refills | Status: DC

## 2022-09-18 MED ORDER — DOXYCYCLINE HYCLATE 100 MG PO TABS: 100.0000 mg | ORAL_TABLET | Freq: Two times a day (BID) | ORAL | 0 refills | Status: DC

## 2022-09-18 NOTE — Progress Notes (Signed)
Subjective:  Rebecca Andrade is a 51 y.o. female who presents for Chief Complaint  Patient presents with   boil leaking    Boil leaking- busted last night very painful.      Here for recheck.  I saw her on 09/11/2022 for abscess.  After her last visit she started the antibiotic Bactrim.  The area got a little worse and then busted a few days ago.  It has drained some pus.  She finished her last antibiotic tablet this morning. No fever.  No body aches or chills.  No nausea or vomiting.  No history of MRSA.  She has had abscess of boils in the past.  No current household contacts with similar.  No other aggravating or relieving factors.    No other c/o.  The following portions of the patient's history were reviewed and updated as appropriate: allergies, current medications, past family history, past medical history, past social history, past surgical history and problem list.  ROS Otherwise as in subjective above  Objective: BP 110/70   Pulse 73   Temp (!) 97.1 F (36.2 C)   LMP 09/02/2018   SpO2 96%   General appearance: alert, no distress, well developed, well nourished Skin: Right lateral chest wall midline with a 2 cm raised erythematous lesion with 78mm open wound that has slight pus, consistent with cellulitis.  Tender to touch.  No induration or fluctuance.    Assessment: Encounter Diagnosis  Name Primary?   Cutaneous abscess of chest wall Yes     Plan: She finished Bactrim today but the wound is not really much improved.  Change to doxycycline oral and topical Silvadene with wound dressing changes at least once daily.  Continue warm compresses.  Wound culture sent. If not fully resolved or much improved this week then call or recheck.     Jarrah was seen today for boil leaking.  Diagnoses and all orders for this visit:  Cutaneous abscess of chest wall -     WOUND CULTURE  Other orders -     doxycycline (VIBRA-TABS) 100 MG tablet; Take 1 tablet (100 mg total) by  mouth 2 (two) times daily. -     silver sulfADIAZINE (SILVADENE) 1 % cream; Apply 1 Application topically daily.   Follow up: prn

## 2022-09-20 LAB — WOUND CULTURE: Organism ID, Bacteria: NONE SEEN

## 2022-10-02 ENCOUNTER — Ambulatory Visit (INDEPENDENT_AMBULATORY_CARE_PROVIDER_SITE_OTHER): Payer: Medicare HMO | Admitting: Medical

## 2022-10-02 VITALS — BP 110/70 | HR 75 | Temp 97.0°F

## 2022-10-02 DIAGNOSIS — L03313 Cellulitis of chest wall: Secondary | ICD-10-CM

## 2022-10-02 DIAGNOSIS — R21 Rash and other nonspecific skin eruption: Secondary | ICD-10-CM | POA: Diagnosis not present

## 2022-10-02 MED ORDER — DOXYCYCLINE HYCLATE 100 MG PO TABS: 100.0000 mg | ORAL_TABLET | Freq: Two times a day (BID) | ORAL | 0 refills | Status: DC

## 2022-10-02 MED ORDER — CHLORHEXIDINE GLUCONATE 4 % EX LIQD: Freq: Every day | CUTANEOUS | 0 refills | Status: DC | PRN

## 2022-10-02 NOTE — Progress Notes (Signed)
Subjective:  Rebecca Andrade is a 51 y.o. female who presents for Chief Complaint  Patient presents with   still bump on side.    Still bump on side. No pain just not sure if she needs another antibiotic or not.      Here for recheck.  I saw her on 09/11/2022 and 09/18/22 for abscess.  At this point she used Bactrim and Bactroban ointment topically initially, then we changed to doxycycline and silvadene cream on 09/18/22.  Today much improved but wanted to recheck. No other aggravating or relieving factors.    No other c/o.  The following portions of the patient's history were reviewed and updated as appropriate: allergies, current medications, past family history, past medical history, past social history, past surgical history and problem list.  ROS Otherwise as in subjective above  Objective: BP 110/70   Pulse 75   Temp (!) 97 F (36.1 C)   LMP 09/02/2018   General appearance: alert, no distress, well developed, well nourished Skin: Right lateral chest wall midline with a 1 cm pinkish lesion mostly resolved.  No induration, no fluctuance, no warmth, nontender    Assessment: Encounter Diagnoses  Name Primary?   Cellulitis of chest wall Yes   Rash       Plan: Cellulitis almost resolve at this point.  See instructions below discussed with patient.  She has new rash reaction to bandaid.  Discontinue bandaid and use gauze and paper tape at this point.  Patient Instructions  Your skin infection is almost resolved  Continue Silvadene cream another 4-5 days, then just use gauze without cream until its fading away  If any new skin infection similar in the next few weeks, you can use the refill on Doxycycline antibiotic I sent today to the pharmacy.  Use the Hibiclens body wash this week, and then periodically  Within a week as long as the area is less red/less noticeable, then you can leave it uncovered without bandage.     [name] was seen today for still bump on  side..  Diagnoses and all orders for this visit:  Cellulitis of chest wall  Rash  Other orders -     doxycycline (VIBRA-TABS) 100 MG tablet; Take 1 tablet (100 mg total) by mouth 2 (two) times daily. -     chlorhexidine (HIBICLENS) 4 % external liquid; Apply topically daily as needed.   Follow up: prn

## 2022-10-02 NOTE — Patient Instructions (Addendum)
Your skin infection is almost resolved  Continue Silvadene cream another 4-5 days, then just use gauze without cream until its fading away  If any new skin infection similar in the next few weeks, you can use the refill on Doxycycline antibiotic I sent today to the pharmacy.  Use the Hibiclens body wash this week, and then periodically  Within a week as long as the area is less red/less noticeable, then you can leave it uncovered without bandage.

## 2022-10-05 ENCOUNTER — Other Ambulatory Visit (HOSPITAL_COMMUNITY): Payer: Self-pay | Admitting: Psychiatry

## 2022-10-05 DIAGNOSIS — F332 Major depressive disorder, recurrent severe without psychotic features: Secondary | ICD-10-CM

## 2022-10-10 ENCOUNTER — Institutional Professional Consult (permissible substitution): Payer: Medicare HMO | Admitting: Medical

## 2022-10-11 ENCOUNTER — Encounter: Payer: Self-pay | Admitting: Medical

## 2022-10-11 ENCOUNTER — Telehealth: Payer: Self-pay | Admitting: Medical

## 2022-10-11 ENCOUNTER — Ambulatory Visit (INDEPENDENT_AMBULATORY_CARE_PROVIDER_SITE_OTHER): Payer: Medicare HMO | Admitting: Medical

## 2022-10-11 VITALS — BP 124/80 | HR 74 | Temp 98.3°F | Wt 194.4 lb

## 2022-10-11 DIAGNOSIS — Z79899 Other long term (current) drug therapy: Secondary | ICD-10-CM

## 2022-10-11 DIAGNOSIS — N951 Menopausal and female climacteric states: Secondary | ICD-10-CM | POA: Diagnosis not present

## 2022-10-11 MED ORDER — VEOZAH 45 MG PO TABS: 1.0000 | ORAL_TABLET | Freq: Every day | ORAL | 2 refills | Status: DC
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
## 2022-10-11 NOTE — Telephone Encounter (Signed)
Alternative requested

## 2022-10-15 NOTE — Telephone Encounter (Signed)
I have spoke with rep and got info for pt to call t#  [phone] [facility] support solutions and should only cost pt $31 a month, please submit Rx Veozah to [facility] pharmacy services & they are supposed to work with pt's insurance to get this approved. Please send Rx there & send back to me to call pt

## 2022-10-16 ENCOUNTER — Other Ambulatory Visit: Payer: Self-pay | Admitting: Medical

## 2022-10-16 MED ORDER — VEOZAH 45 MG PO TABS: 1.0000 | ORAL_TABLET | Freq: Every day | ORAL | 2 refills | Status: DC

## 2022-10-19 ENCOUNTER — Telehealth (HOSPITAL_COMMUNITY): Payer: Self-pay

## 2022-10-19 DIAGNOSIS — F900 Attention-deficit hyperactivity disorder, predominantly inattentive type: Secondary | ICD-10-CM

## 2022-10-19 MED ORDER — AMPHETAMINE-DEXTROAMPHETAMINE 10 MG PO TABS: ORAL_TABLET | ORAL | 0 refills | Status: DC

## 2022-10-19 NOTE — Telephone Encounter (Signed)
Patient called requesting a refill  pharmacy update in chart please advise    Disp Refills Start End   amphetamine-dextroamphetamine (ADDERALL) 10 MG tablet 120 tablet 0 09/12/2022    Sig: Take 2 in am and 2 at noon   Sent to pharmacy as: amphetamine-dextroamphetamine (ADDERALL) 10 MG tablet   Earliest Fill Date: 09/12/2022   Notes to Pharmacy: Do not refill until 09/21/22   E-Prescribing Status: Receipt confirmed by pharmacy (09/12/2022  2:39 PM EST)

## 2022-10-19 NOTE — Telephone Encounter (Signed)
Done

## 2022-10-23 ENCOUNTER — Telehealth (HOSPITAL_COMMUNITY): Payer: Self-pay | Admitting: *Deleted

## 2022-10-23 DIAGNOSIS — F900 Attention-deficit hyperactivity disorder, predominantly inattentive type: Secondary | ICD-10-CM

## 2022-10-23 MED ORDER — AMPHETAMINE-DEXTROAMPHETAMINE 10 MG PO TABS: ORAL_TABLET | ORAL | 0 refills | Status: DC

## 2022-10-23 NOTE — Telephone Encounter (Signed)
Pt called several times this morning requesting refill of the Adderall 10 mg. Writer advised pt that script was sent in on 10/19/22. Writer advised pt that script had been sent to [facility] on [address]in [place] on 10/19/22. Pt became upset and stated that she has never used that pharmacy before and to [facility] [address] Writer deleted the other two pharmacies on profile with pt's permission. Writer called the [facility] pharmacy to verify script had not been filled and they are to d/c that script. Pt next appointment is on 12/12/22. Please review.

## 2022-10-23 NOTE — Telephone Encounter (Signed)
Done

## 2022-10-25 ENCOUNTER — Other Ambulatory Visit: Payer: Self-pay | Admitting: Medical

## 2022-10-25 NOTE — Telephone Encounter (Signed)
Refill request last apt 10/11/22 next apt 01/10/23.

## 2022-11-09 ENCOUNTER — Other Ambulatory Visit: Payer: Self-pay | Admitting: Psychiatry

## 2022-11-14 ENCOUNTER — Other Ambulatory Visit: Payer: Self-pay

## 2022-11-14 ENCOUNTER — Telehealth: Payer: Self-pay | Admitting: Psychiatry

## 2022-11-14 MED ORDER — PROMETHAZINE HCL 25 MG PO TABS: 25.0000 mg | ORAL_TABLET | Freq: Three times a day (TID) | ORAL | 6 refills | Status: DC | PRN

## 2022-11-14 NOTE — Telephone Encounter (Signed)
Refilled patient Rx, promethazine (PHENERGAN) 25 MG tablet.  Pt propranolol and diclofenac  is too soon for refill.

## 2022-11-14 NOTE — Telephone Encounter (Signed)
Pt request refills for promethazine (PHENERGAN) 25 MG tablet, and diclofenac (CATAFLAM) 50 MG tablet, and  propranolol ER (INDERAL LA) 80 MG 24 hr capsule at  [id number]

## 2022-11-20 ENCOUNTER — Telehealth (HOSPITAL_COMMUNITY): Payer: Self-pay

## 2022-11-20 DIAGNOSIS — F900 Attention-deficit hyperactivity disorder, predominantly inattentive type: Secondary | ICD-10-CM

## 2022-11-20 MED ORDER — AMPHETAMINE-DEXTROAMPHETAMINE 10 MG PO TABS: ORAL_TABLET | ORAL | 0 refills | Status: DC

## 2022-11-20 NOTE — Telephone Encounter (Signed)
Made patient aware.

## 2022-11-20 NOTE — Telephone Encounter (Signed)
Patient called to request a refill for the following medication

## 2022-11-20 NOTE — Telephone Encounter (Signed)
Patient called to request a refill for the following medication Last visit 09/12/22 Next visit 12/12/22    amphetamine-dextroamphetamine (ADDERALL) 10 MG tablet 120 tablet 0 10/23/2022    Sig: Take 2 in am and 2 at noon   Sent to pharmacy as: amphetamine-dextroamphetamine (ADDERALL) 10 MG tablet   Earliest Fill Date: 10/23/2022   E-Prescribing Status: Receipt confirmed by pharmacy (10/23/2022 10:50 AM EST

## 2022-11-20 NOTE — Telephone Encounter (Signed)
Prescription sent to pharmacy.  Patient can pick up on due date.

## 2022-11-27 DIAGNOSIS — H52223 Regular astigmatism, bilateral: Secondary | ICD-10-CM | POA: Diagnosis not present

## 2022-11-27 DIAGNOSIS — H5203 Hypermetropia, bilateral: Secondary | ICD-10-CM | POA: Diagnosis not present

## 2022-11-27 DIAGNOSIS — H524 Presbyopia: Secondary | ICD-10-CM | POA: Diagnosis not present

## 2022-11-29 ENCOUNTER — Telehealth: Payer: Medicare HMO | Admitting: Medical

## 2022-11-29 DIAGNOSIS — H524 Presbyopia: Secondary | ICD-10-CM | POA: Diagnosis not present

## 2022-11-29 DIAGNOSIS — H5203 Hypermetropia, bilateral: Secondary | ICD-10-CM | POA: Diagnosis not present

## 2022-11-29 DIAGNOSIS — H52209 Unspecified astigmatism, unspecified eye: Secondary | ICD-10-CM | POA: Diagnosis not present

## 2022-11-30 NOTE — Progress Notes (Signed)
error 

## 2022-12-02 ENCOUNTER — Other Ambulatory Visit (HOSPITAL_COMMUNITY): Payer: Self-pay | Admitting: Psychiatry

## 2022-12-02 DIAGNOSIS — F332 Major depressive disorder, recurrent severe without psychotic features: Secondary | ICD-10-CM

## 2022-12-04 ENCOUNTER — Other Ambulatory Visit (HOSPITAL_COMMUNITY): Payer: Self-pay | Admitting: Psychiatry

## 2022-12-04 DIAGNOSIS — F4010 Social phobia, unspecified: Secondary | ICD-10-CM

## 2022-12-04 DIAGNOSIS — F332 Major depressive disorder, recurrent severe without psychotic features: Secondary | ICD-10-CM

## 2022-12-06 ENCOUNTER — Other Ambulatory Visit: Payer: Self-pay | Admitting: Medical

## 2022-12-06 ENCOUNTER — Other Ambulatory Visit (HOSPITAL_COMMUNITY): Payer: Self-pay | Admitting: *Deleted

## 2022-12-06 DIAGNOSIS — F332 Major depressive disorder, recurrent severe without psychotic features: Secondary | ICD-10-CM

## 2022-12-06 MED ORDER — QUETIAPINE FUMARATE 100 MG PO TABS: 100.0000 mg | ORAL_TABLET | Freq: Every day | ORAL | 0 refills | Status: DC

## 2022-12-07 ENCOUNTER — Other Ambulatory Visit (HOSPITAL_COMMUNITY): Payer: Self-pay | Admitting: Psychiatry

## 2022-12-07 DIAGNOSIS — F4010 Social phobia, unspecified: Secondary | ICD-10-CM

## 2022-12-08 ENCOUNTER — Other Ambulatory Visit (HOSPITAL_COMMUNITY): Payer: Self-pay | Admitting: *Deleted

## 2022-12-08 DIAGNOSIS — F4010 Social phobia, unspecified: Secondary | ICD-10-CM

## 2022-12-08 DIAGNOSIS — F332 Major depressive disorder, recurrent severe without psychotic features: Secondary | ICD-10-CM

## 2022-12-08 MED ORDER — BUSPIRONE HCL 30 MG PO TABS: 30.0000 mg | ORAL_TABLET | Freq: Two times a day (BID) | ORAL | 0 refills | Status: DC

## 2022-12-12 ENCOUNTER — Telehealth (HOSPITAL_BASED_OUTPATIENT_CLINIC_OR_DEPARTMENT_OTHER): Payer: Medicare HMO | Admitting: Psychiatry

## 2022-12-12 ENCOUNTER — Encounter (HOSPITAL_COMMUNITY): Payer: Self-pay | Admitting: Psychiatry

## 2022-12-12 DIAGNOSIS — F4010 Social phobia, unspecified: Secondary | ICD-10-CM

## 2022-12-12 DIAGNOSIS — F332 Major depressive disorder, recurrent severe without psychotic features: Secondary | ICD-10-CM | POA: Diagnosis not present

## 2022-12-12 DIAGNOSIS — F900 Attention-deficit hyperactivity disorder, predominantly inattentive type: Secondary | ICD-10-CM

## 2022-12-12 MED ORDER — FLUOXETINE HCL 40 MG PO CAPS: 40.0000 mg | ORAL_CAPSULE | Freq: Every day | ORAL | 2 refills | Status: DC

## 2022-12-12 MED ORDER — QUETIAPINE FUMARATE 100 MG PO TABS: 100.0000 mg | ORAL_TABLET | Freq: Every day | ORAL | 2 refills | Status: DC

## 2022-12-12 MED ORDER — VENLAFAXINE HCL ER 150 MG PO CP24: 150.0000 mg | ORAL_CAPSULE | Freq: Every day | ORAL | 2 refills | Status: DC

## 2022-12-12 MED ORDER — BUSPIRONE HCL 30 MG PO TABS: 30.0000 mg | ORAL_TABLET | Freq: Two times a day (BID) | ORAL | 2 refills | Status: DC

## 2022-12-12 MED ORDER — AMPHETAMINE-DEXTROAMPHETAMINE 10 MG PO TABS: ORAL_TABLET | ORAL | 0 refills | Status: DC

## 2022-12-12 NOTE — Progress Notes (Signed)
Virtual Visit via Telephone Note  I connected with Rebecca Andrade on 12/12/22 at  2:20 PM EST by telephone and verified that I am speaking with the correct person using two identifiers.  Location: Patient: Home Provider: Home Office   I discussed the limitations, risks, security and privacy concerns of performing an evaluation and management service by telephone and the availability of in person appointments. I also discussed with the patient that there may be a patient responsible charge related to this service. The patient expressed understanding and agreed to proceed.   History of Present Illness: Patient is evaluated by phone session.  She is taking all her medication as prescribed.  She has chronic symptoms but stable.  She is worried about her 26 year old mother who continues to drink.  Patient told mother lives with patient's son and she had tried few times to talk to her mother but it was unsuccessful.  She admitted sadness due to loss of multiple members in recent months.  She reported Christmas was very quiet.  However she is happy that son is doing very well.  She has headaches but manageable.  Her appetite is okay.  She tried to lose weight but so far unsuccessful.  We have recommended to go outside for walking but due to her severe anxiety she does not want to leave the house.  Now she is trying to watch her calorie intake.  She has no tremors, shakes or any EPS.  Her attention concentration is okay.  She liked the Adderall that is helping her attention and focus and multitasking.  She is able to function.  She has no tremors, shakes or any EPS.  Her sleep is good.  She denies any suicidal thoughts or any feeling of hopelessness or worthlessness.   Past Psychiatric History: Reviewed. H/O taking antidepressants since 1990.  H/O inpatient twice, one in 2005 and in 2014. H/O cocaine use, paranoia, hallucination, mood swing, anger and mania. No h/o suicidal attempt. Tried Zoloft, Paxil,  Lexapro, Wellbutrin, lithium, Remeron, Topamax, Abilify, Pristiq, Effexor, amitriptyline, Xanax, temazepam, Valium and Cymbalta.  PCP prscribed Adderall for ADD.     Psychiatric Specialty Exam: Physical Exam  Review of Systems  Weight 194 lb (88 kg), last menstrual period 09/02/2018.There is no height or weight on file to calculate BMI.  General Appearance: NA  Eye Contact:  NA  Speech:  Slow  Volume:  Decreased  Mood:  Dysphoric  Affect:  NA  Thought Process:  Goal Directed  Orientation:  Full (Time, Place, and Person)  Thought Content:  Rumination  Suicidal Thoughts:  No  Homicidal Thoughts:  No  Memory:  Immediate;   Good Recent;   Fair Remote;   Fair  Judgement:  Intact  Insight:  Present  Psychomotor Activity:  NA  Concentration:  Concentration: Fair and Attention Span: Fair  Recall:  [employer] of Knowledge:  Good  Language:  Good  Akathisia:  No  Handed:  Right  AIMS (if indicated):     Assets:  Communication Skills Desire for Improvement Housing  ADL's:  Intact  Cognition:  WNL  Sleep:   ok      Assessment and Plan: Major depressive disorder, recurrent.  Social anxiety disorder.  Attention deficit disorder, inattentive type.  Patient has not able to contact the therapist but like to get a referral from our office.  We will refer her for therapy.  Patient does not want to change the medication.  Continue Seroquel 100 mg at  bedtime, BuSpar 30 mg daily, venlafaxine 150 mg daily, Prozac 40 mg daily and Adderall 10 mg 2 pill in the morning and 2 in the afternoon.  Discussed medication side effects and benefits.  Recommended to call us back if she is any question or any concern.  Follow-up in 3 months.  Follow Up Instructions:    I discussed the assessment and treatment plan with the patient. The patient was provided an opportunity to ask questions and all were answered. The patient agreed with the plan and demonstrated an understanding of the instructions.   The  patient was advised to call back or seek an in-person evaluation if the symptoms worsen or if the condition fails to improve as anticipated.  Collaboration of Care: Other provider involved in patient's care AEB notes are available in epic to review.  Patient/Guardian was advised Release of Information must be obtained prior to any record release in order to collaborate their care with an outside provider. Patient/Guardian was advised if they have not already done so to contact the registration department to sign all necessary forms in order for [place] to release information regarding their care.   Consent: Patient/Guardian gives verbal consent for treatment and assignment of benefits for services provided during this visit. Patient/Guardian expressed understanding and agreed to proceed.    I provided 22 minutes of non-face-to-face time during this encounter.   Kathlee Nations, MD

## 2023-01-10 ENCOUNTER — Ambulatory Visit (INDEPENDENT_AMBULATORY_CARE_PROVIDER_SITE_OTHER): Payer: Medicare HMO | Admitting: Medical

## 2023-01-10 VITALS — BP 110/80 | HR 90 | Wt 194.2 lb

## 2023-01-10 DIAGNOSIS — I700 Atherosclerosis of aorta: Secondary | ICD-10-CM

## 2023-01-10 DIAGNOSIS — N3281 Overactive bladder: Secondary | ICD-10-CM | POA: Diagnosis not present

## 2023-01-10 DIAGNOSIS — Z7185 Encounter for immunization safety counseling: Secondary | ICD-10-CM

## 2023-01-10 DIAGNOSIS — E7800 Pure hypercholesterolemia, unspecified: Secondary | ICD-10-CM

## 2023-01-10 DIAGNOSIS — N951 Menopausal and female climacteric states: Secondary | ICD-10-CM | POA: Diagnosis not present

## 2023-01-10 DIAGNOSIS — E559 Vitamin D deficiency, unspecified: Secondary | ICD-10-CM | POA: Diagnosis not present

## 2023-01-10 DIAGNOSIS — K5909 Other constipation: Secondary | ICD-10-CM

## 2023-01-10 MED ORDER — VEOZAH 45 MG PO TABS: 1.0000 | ORAL_TABLET | Freq: Every day | ORAL | 2 refills | Status: DC
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
## 2023-01-11 ENCOUNTER — Other Ambulatory Visit (HOSPITAL_COMMUNITY): Payer: Self-pay | Admitting: Psychiatry

## 2023-01-11 DIAGNOSIS — F4010 Social phobia, unspecified: Secondary | ICD-10-CM

## 2023-01-11 DIAGNOSIS — F332 Major depressive disorder, recurrent severe without psychotic features: Secondary | ICD-10-CM

## 2023-01-11 LAB — VITAMIN D 25 HYDROXY (VIT D DEFICIENCY, FRACTURES): Vit D, 25-Hydroxy: 58.4 ng/mL (ref 30.0–100.0)

## 2023-01-11 LAB — BASIC METABOLIC PANEL
BUN/Creatinine Ratio: 18 (ref 9–23)
BUN: 13 mg/dL (ref 6–24)
CO2: 21 mmol/L (ref 20–29)
Calcium: 9.3 mg/dL (ref 8.7–10.2)
Chloride: 107 mmol/L — ABNORMAL HIGH (ref 96–106)
Creatinine, Ser: 0.74 mg/dL (ref 0.57–1.00)
Glucose: 86 mg/dL (ref 70–99)
Potassium: 3.8 mmol/L (ref 3.5–5.2)
Sodium: 143 mmol/L (ref 134–144)
eGFR: 97 mL/min/{1.73_m2} (ref >59)

## 2023-01-11 NOTE — Progress Notes (Signed)
Blood sugar kidney and electrolytes normal, vitamin D looks good.  Continue current medications.  I sent the [facility] through the pharmacy.  Lets see what we can do as far as insurance approval and prior authorization.

## 2023-01-18 ENCOUNTER — Telehealth (HOSPITAL_COMMUNITY): Payer: Self-pay | Admitting: *Deleted

## 2023-01-18 DIAGNOSIS — F900 Attention-deficit hyperactivity disorder, predominantly inattentive type: Secondary | ICD-10-CM

## 2023-01-18 NOTE — Telephone Encounter (Signed)
Pt called twice today requesting refill of the Adderall 10 mg 2 BID. Last Rx e-scribed on 12/12/22. Pt next scheduled appointment is on 03/13/23. Please review.

## 2023-01-19 ENCOUNTER — Telehealth (HOSPITAL_COMMUNITY): Payer: Self-pay | Admitting: Psychiatry

## 2023-01-19 MED ORDER — AMPHETAMINE-DEXTROAMPHETAMINE 10 MG PO TABS: ORAL_TABLET | ORAL | 0 refills | Status: DC

## 2023-01-19 NOTE — Telephone Encounter (Signed)
Patient called in again requesting a refill of her amphetamine-dextroamphetamine (ADDERALL) 10 MG tablet.  [id number] [address], [place] Phone: [phone]  Fax: [phone]

## 2023-01-19 NOTE — Telephone Encounter (Signed)
I send the script and she can pick up on her due date.

## 2023-01-22 ENCOUNTER — Ambulatory Visit (INDEPENDENT_AMBULATORY_CARE_PROVIDER_SITE_OTHER): Payer: Medicare HMO | Admitting: Medical

## 2023-01-22 VITALS — BP 120/70 | HR 83 | Wt 194.2 lb

## 2023-01-22 DIAGNOSIS — N951 Menopausal and female climacteric states: Secondary | ICD-10-CM | POA: Diagnosis not present

## 2023-01-22 DIAGNOSIS — L723 Sebaceous cyst: Secondary | ICD-10-CM

## 2023-01-22 DIAGNOSIS — N3281 Overactive bladder: Secondary | ICD-10-CM | POA: Diagnosis not present

## 2023-01-22 MED ORDER — DOXYCYCLINE HYCLATE 100 MG PO TABS: 100.0000 mg | ORAL_TABLET | Freq: Two times a day (BID) | ORAL | 0 refills | Status: DC

## 2023-01-22 NOTE — Progress Notes (Signed)
Subjective:  Rebecca Andrade is a 52 y.o. female who presents for Chief Complaint  Patient presents with   bump on scalp    Bump on scalp. Noticed it went it was the side of a pen head and has gotten bigger     Here for bump on scalp.  Just noticed it recently, and it has gotten a bit bigger, size of pencil eraser.   No drainage, no redness.   Its tender if she messes with it.  Still having issues with hot flashes and insurance would not cover Veozah.  No other aggravating or relieving factors.    No other c/o.  The following portions of the patient's history were reviewed and updated as appropriate: allergies, current medications, past family history, past medical history, past social history, past surgical history and problem list.  ROS Otherwise as in subjective above    Objective: BP 120/70   Pulse 83   Wt 194 lb 3.2 oz (88.1 kg)   LMP 09/02/2018   BMI 30.42 kg/m   General appearance: alert, no distress, well developed, well nourished [place] scalp within hair line with 6-7 mm diameter raised flesh colored subcutaneous mass with central pore suggestive of sebaceous cyst    Assessment: Encounter Diagnoses  Name Primary?   Hot flashes, menopausal Yes   OAB (overactive bladder)    Sebaceous cyst      Plan: Discussed symptoms, concerns, possible treatment options.   Discussed the following:   Patient Instructions  Sebaceous cyst of scalp Currently the cyst is not infected, so 1 option is to leave it alone  Other options as below: Try warm compresses for 15-20 minutes each or hot shower over the area Begin Doxycycline twice daily antibiotic x 1 week If its getting bigger or not resolving, we can try to incise and drain the cyst   Hot flashes I am sorry that insurance wouldn't cover the [facility].   I recommend talking with your psychiatrist about your mental health medications and options they may want to consider for hot flashes  Another option is referral  to gynecology to discuss other treatments    Rebecca Andrade was seen today for bump on scalp.  Diagnoses and all orders for this visit:  Hot flashes, menopausal  OAB (overactive bladder)  Sebaceous cyst    Follow up: prn

## 2023-01-22 NOTE — Patient Instructions (Addendum)
Sebaceous cyst of scalp Currently the cyst is not infected, so 1 option is to leave it alone  Other options as below: Try warm compresses for 15-20 minutes each or hot shower over the area Begin Doxycycline twice daily antibiotic x 1 week If its getting bigger or not resolving, we can try to incise and drain the cyst   Hot flashes I am sorry that insurance wouldn't cover the [facility].   I recommend talking with your psychiatrist about your mental health medications and options they may want to consider for hot flashes  Another option is referral to gynecology to discuss other treatments

## 2023-01-24 ENCOUNTER — Telehealth: Payer: Self-pay | Admitting: Medical

## 2023-01-24 NOTE — Telephone Encounter (Signed)
P.A. Willette Pa completed

## 2023-02-03 ENCOUNTER — Other Ambulatory Visit: Payer: Self-pay | Admitting: Psychiatry

## 2023-02-05 NOTE — Telephone Encounter (Signed)
Last seen on 08/09/22 Follow up scheduled on 02/13/23 with Judson Roch  Last filled on 10/25/22 #90 tablets

## 2023-02-13 ENCOUNTER — Ambulatory Visit: Payer: Medicare HMO | Admitting: Neurology

## 2023-02-13 ENCOUNTER — Encounter: Payer: Self-pay | Admitting: Medical

## 2023-02-13 ENCOUNTER — Ambulatory Visit (INDEPENDENT_AMBULATORY_CARE_PROVIDER_SITE_OTHER): Payer: Medicare HMO | Admitting: Medical

## 2023-02-13 ENCOUNTER — Telehealth: Payer: Self-pay | Admitting: Internal Medicine

## 2023-02-13 VITALS — Temp 100.0°F | Ht 67.0 in | Wt 195.0 lb

## 2023-02-13 DIAGNOSIS — B349 Viral infection, unspecified: Secondary | ICD-10-CM

## 2023-02-13 DIAGNOSIS — J988 Other specified respiratory disorders: Secondary | ICD-10-CM

## 2023-02-13 DIAGNOSIS — R509 Fever, unspecified: Secondary | ICD-10-CM | POA: Diagnosis not present

## 2023-02-13 MED ORDER — PROMETHAZINE-DM 6.25-15 MG/5ML PO SYRP: 5.0000 mL | ORAL_SOLUTION | Freq: Four times a day (QID) | ORAL | 0 refills | Status: DC | PRN
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
## 2023-02-13 NOTE — Telephone Encounter (Signed)
Pt covid test is negative
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
## 2023-02-13 NOTE — Telephone Encounter (Signed)
Pt was notified.  

## 2023-02-15 ENCOUNTER — Telehealth: Payer: Self-pay | Admitting: Medical

## 2023-02-15 NOTE — Telephone Encounter (Signed)
Pt states she just took the last of the cough medicine and cough is some better but still has cough. I asked if pharmacy gave her the full 120 mls & she said she wasn't sure she already thru the bottle away. She also asked if you could increase the strength

## 2023-02-16 ENCOUNTER — Other Ambulatory Visit: Payer: Self-pay | Admitting: Medical

## 2023-02-16 MED ORDER — PROMETHAZINE-DM 6.25-15 MG/5ML PO SYRP: 5.0000 mL | ORAL_SOLUTION | Freq: Four times a day (QID) | ORAL | 0 refills | Status: DC | PRN

## 2023-02-19 ENCOUNTER — Telehealth (HOSPITAL_COMMUNITY): Payer: Self-pay | Admitting: *Deleted

## 2023-02-19 DIAGNOSIS — F900 Attention-deficit hyperactivity disorder, predominantly inattentive type: Secondary | ICD-10-CM

## 2023-02-19 MED ORDER — AMPHETAMINE-DEXTROAMPHETAMINE 10 MG PO TABS: ORAL_TABLET | ORAL | 0 refills | Status: DC

## 2023-02-19 NOTE — Telephone Encounter (Signed)
Pt called requesting a refill of the Adderall 10 mg 2 tabs QAm and 2 tabs Q 12 noon. Last e-secribed on 01/19/23. Pt next f/u scheduled for 03/13/23. [facility] [address],

## 2023-02-19 NOTE — Telephone Encounter (Signed)
Done

## 2023-02-19 NOTE — Telephone Encounter (Signed)
P.A. Allyne Gee denied, pt needs trial of preferred meds, Citalopram, Estradiol & estradiol-norethindrone acet tablet.  Do you want to switch?

## 2023-02-20 ENCOUNTER — Other Ambulatory Visit: Payer: Self-pay | Admitting: Medical

## 2023-02-20 MED ORDER — ESTRADIOL 0.5 MG PO TABS: 0.5000 mg | ORAL_TABLET | Freq: Every day | ORAL | 1 refills | Status: DC

## 2023-02-20 NOTE — Telephone Encounter (Signed)
Called pt and she is fine trying one of the preferred medications, she said the Veozah worked really well for her and helped a lot with her hot flashes.  The preferred meds are Citalopram,  Estradiol & estradiol-norethindrone acet tablet.  Please switch pt to one of these.

## 2023-02-21 ENCOUNTER — Other Ambulatory Visit: Payer: Self-pay | Admitting: Medical

## 2023-02-21 NOTE — Telephone Encounter (Signed)
Refill request last apt 02/13/23.

## 2023-02-27 ENCOUNTER — Other Ambulatory Visit: Payer: Self-pay

## 2023-02-27 ENCOUNTER — Ambulatory Visit (INDEPENDENT_AMBULATORY_CARE_PROVIDER_SITE_OTHER): Payer: Medicare HMO | Admitting: Medical

## 2023-02-27 ENCOUNTER — Other Ambulatory Visit: Payer: Self-pay | Admitting: Psychiatry

## 2023-02-27 VITALS — BP 110/68 | HR 62 | Temp 97.3°F | Wt 191.2 lb

## 2023-02-27 DIAGNOSIS — L03317 Cellulitis of buttock: Secondary | ICD-10-CM | POA: Diagnosis not present

## 2023-02-27 MED ORDER — DOXYCYCLINE HYCLATE 100 MG PO TABS: 100.0000 mg | ORAL_TABLET | Freq: Two times a day (BID) | ORAL | 0 refills | Status: DC

## 2023-02-27 MED ORDER — MUPIROCIN 2 % EX OINT: 1.0000 | TOPICAL_OINTMENT | Freq: Two times a day (BID) | CUTANEOUS | 1 refills | Status: DC

## 2023-02-27 MED ORDER — HYDROCODONE-ACETAMINOPHEN 5-325 MG PO TABS: 1.0000 | ORAL_TABLET | Freq: Four times a day (QID) | ORAL | 0 refills | Status: DC | PRN

## 2023-02-27 MED ORDER — CHLORHEXIDINE GLUCONATE 4 % EX SOLN: Freq: Every day | CUTANEOUS | 1 refills | Status: DC | PRN

## 2023-02-27 NOTE — Telephone Encounter (Signed)
Left message for pt

## 2023-02-27 NOTE — Patient Instructions (Signed)
Cellulitis Begin doxycycline oral antibiotic twice daily for the next 10 days Use mupirocin ointment intranasally 3 times a day for the next week to kill any colonies of the bacteria Use Hibiclens body wash twice this week, lather on your body, leave on for 5 minutes then rinse off Repeat Hibiclens periodically Do some house cleaning of services this week with disinfectant including fossa handles, countertops, toilet seat, bathtub, other commonly touched areas in her house If worse in the next 2 to 3 days, recheck as you may need incision and drainage if it gets worse Use over-the-counter Tylenol for mild pain, but when the pain is much worse you can use the hydrocodone pain medicine as needed for worse pain

## 2023-02-27 NOTE — Progress Notes (Signed)
Subjective:  Rebecca Andrade is a 52 y.o. female who presents for Chief Complaint  Patient presents with   bump on buttom    Bump on buttom, red and infected, drainage     Here for pain.  Started few days ago.  It is red and swollen and tender.  Labs next or use the bathroom.  She has had similar boils in the past.  She did well on doxycycline.  No skin contacts with similar lesions.  No fever.  No body aches or chills.  No other aggravating or relieving factors.    No other c/o.  The following portions of the patient's history were reviewed and updated as appropriate: allergies, current medications, past family history, past medical history, past social history, past surgical history and problem list.  ROS Otherwise as in subjective above  Objective: BP 110/68   Pulse 62   Temp (!) 97.3 F (36.3 C)   Wt 191 lb 3.2 oz (86.7 kg)   LMP 09/02/2018   BMI 29.95 kg/m   General appearance: alert, no distress, well developed, well nourished Right buttock with about 3 to 4 cm tender indurated erythematous lesion with some warmth.  No fluctuance. Exam chaperoned by nurse    Assessment: Encounter Diagnosis  Name Primary?   Cellulitis of buttock, right Yes     Plan:  Cellulitis Begin doxycycline oral antibiotic twice daily for the next 10 days Use mupirocin ointment intranasally 3 times a day for the next week to kill any colonies of the bacteria Use Hibiclens body wash twice this week, lather on your body, leave on for 5 minutes then rinse off Repeat Hibiclens periodically Do some house cleaning of services this week with disinfectant including fossa handles, countertops, toilet seat, bathtub, other commonly touched areas in her house If worse in the next 2 to 3 days, recheck as you may need incision and drainage if it gets worse Use over-the-counter Tylenol for mild pain, but when the pain is much worse you can use the hydrocodone pain medicine as needed for worse pain   Rebecca Andrade  was seen today for bump on buttom.  Diagnoses and all orders for this visit:  Cellulitis of buttock, right  Other orders -     doxycycline (VIBRA-TABS) 100 MG tablet; Take 1 tablet (100 mg total) by mouth 2 (two) times daily. -     chlorhexidine (HIBICLENS) 4 % external liquid; Apply topically daily as needed. -     mupirocin ointment (BACTROBAN) 2 %; Place 1 Application into the nose 2 (two) times daily. -     HYDROcodone-acetaminophen (NORCO) 5-325 MG tablet; Take 1 tablet by mouth every 6 (six) hours as needed.    Follow up: prn

## 2023-02-28 ENCOUNTER — Other Ambulatory Visit: Payer: Self-pay | Admitting: Psychiatry

## 2023-02-28 NOTE — Telephone Encounter (Signed)
Pt last seen 08/29/22  Follow up scheduled on 08/23/23 Rx not mentioned in recent note, last mentioned in telephone encounter on 09/26/21.

## 2023-03-03 ENCOUNTER — Other Ambulatory Visit: Payer: Self-pay | Admitting: Psychiatry

## 2023-03-05 ENCOUNTER — Other Ambulatory Visit (HOSPITAL_COMMUNITY): Payer: Self-pay | Admitting: Psychiatry

## 2023-03-05 DIAGNOSIS — F4010 Social phobia, unspecified: Secondary | ICD-10-CM

## 2023-03-05 DIAGNOSIS — F332 Major depressive disorder, recurrent severe without psychotic features: Secondary | ICD-10-CM

## 2023-03-05 NOTE — Telephone Encounter (Signed)
Pt last seen on 08/09/22 per note "Prevention: Increase propranolol to 80 mg daily " Follow up visit scheduled on 08/23/23 Last filled on 02/05/23 # 30 tablets (30 day supply)

## 2023-03-07 ENCOUNTER — Telehealth (HOSPITAL_COMMUNITY): Payer: Self-pay | Admitting: *Deleted

## 2023-03-07 NOTE — Telephone Encounter (Signed)
Writer returned pt call with request to refill all meds. I had to LVM advising that pt should have enough meds to last until next appointment on 03/13/23. Pt encouraged to call back if she is running out of meds and will need a bridge by the end of the week.

## 2023-03-08 ENCOUNTER — Telehealth (HOSPITAL_COMMUNITY): Payer: Self-pay | Admitting: Psychiatry

## 2023-03-08 DIAGNOSIS — F332 Major depressive disorder, recurrent severe without psychotic features: Secondary | ICD-10-CM

## 2023-03-08 MED ORDER — QUETIAPINE FUMARATE 100 MG PO TABS
100.0000 mg | ORAL_TABLET | Freq: Every day | ORAL | 0 refills | Status: DC
Start: 2023-03-08 — End: 2023-03-13

## 2023-03-08 NOTE — Telephone Encounter (Signed)
Patient called in stating that she is out of her QUEtiapine (SEROQUEL) 100 MG tablet and is requesting a refill.  [facility]: [id number] [place], [place] - [address] (Ph: [phone])   Last ordered: 12/12/22 Last visit: 12/12/22 Next visit: 03/13/23

## 2023-03-08 NOTE — Telephone Encounter (Signed)
[facility].

## 2023-03-09 ENCOUNTER — Other Ambulatory Visit: Payer: Self-pay | Admitting: Medical

## 2023-03-09 ENCOUNTER — Telehealth: Payer: Self-pay | Admitting: Medical

## 2023-03-09 MED ORDER — CLINDAMYCIN HCL 300 MG PO CAPS: 300.0000 mg | ORAL_CAPSULE | Freq: Three times a day (TID) | ORAL | 0 refills | Status: AC

## 2023-03-09 MED ORDER — DOXYCYCLINE HYCLATE 100 MG PO TABS: 100.0000 mg | ORAL_TABLET | Freq: Two times a day (BID) | ORAL | 0 refills | Status: DC

## 2023-03-09 MED ORDER — DOXYCYCLINE HYCLATE 100 MG PO TBEC: 100.0000 mg | DELAYED_RELEASE_TABLET | Freq: Two times a day (BID) | ORAL | 0 refills | Status: DC

## 2023-03-09 NOTE — Telephone Encounter (Signed)
Pt called and said that the spot she has has went down some but is still a little red and sore she wants to know if she can get another round of doxycyline, Pt uses  [id number] [place], [place] - [address]

## 2023-03-09 NOTE — Telephone Encounter (Signed)
Pt.notified

## 2023-03-13 ENCOUNTER — Encounter (HOSPITAL_COMMUNITY): Payer: Self-pay | Admitting: Psychiatry

## 2023-03-13 ENCOUNTER — Telehealth (HOSPITAL_BASED_OUTPATIENT_CLINIC_OR_DEPARTMENT_OTHER): Payer: Medicare HMO | Admitting: Psychiatry

## 2023-03-13 ENCOUNTER — Other Ambulatory Visit (HOSPITAL_COMMUNITY): Payer: Self-pay | Admitting: Psychiatry

## 2023-03-13 DIAGNOSIS — F4010 Social phobia, unspecified: Secondary | ICD-10-CM | POA: Diagnosis not present

## 2023-03-13 DIAGNOSIS — F332 Major depressive disorder, recurrent severe without psychotic features: Secondary | ICD-10-CM

## 2023-03-13 DIAGNOSIS — F900 Attention-deficit hyperactivity disorder, predominantly inattentive type: Secondary | ICD-10-CM

## 2023-03-13 MED ORDER — FLUOXETINE HCL 40 MG PO CAPS
40.0000 mg | ORAL_CAPSULE | Freq: Every day | ORAL | 2 refills | Status: DC
Start: 2023-03-13 — End: 2023-06-11

## 2023-03-13 MED ORDER — VENLAFAXINE HCL ER 150 MG PO CP24
150.0000 mg | ORAL_CAPSULE | Freq: Every day | ORAL | 2 refills | Status: DC
Start: 2023-03-13 — End: 2023-06-11

## 2023-03-13 MED ORDER — AMPHETAMINE-DEXTROAMPHETAMINE 10 MG PO TABS: ORAL_TABLET | ORAL | 0 refills | Status: DC

## 2023-03-13 MED ORDER — QUETIAPINE FUMARATE 100 MG PO TABS
100.0000 mg | ORAL_TABLET | Freq: Every day | ORAL | 2 refills | Status: DC
Start: 2023-03-13 — End: 2023-06-11

## 2023-03-13 MED ORDER — BUSPIRONE HCL 30 MG PO TABS
30.0000 mg | ORAL_TABLET | Freq: Two times a day (BID) | ORAL | 2 refills | Status: DC
Start: 2023-03-13 — End: 2023-06-11

## 2023-03-13 NOTE — Progress Notes (Signed)
[facility] MD Virtual Progress Note   Patient Location: Home Provider Location: Home Office  I connect with patient by telephone and verified that I am speaking with correct person by using two identifiers. I discussed the limitations of evaluation and management by telemedicine and the availability of in person appointments. I also discussed with the patient that there may be a patient responsible charge related to this service. The patient expressed understanding and agreed to proceed.  Rebecca Andrade [id number] 52 y.o.  03/13/2023 2:23 PM  History of Present Illness:  Patient is evaluated by phone session.  She is stable on current medication.  Sometimes she thinks about her brother who passed away more than a year ago.  She admitted some concern about her 26 year old mother who continues to drink and live by herself.  Patient reported she does not want to communicate with her lately.  However she is very happy that her son is doing very well and his girlfriend recently graduated as an Lexicographer and going to work for the hospital.  She is sleeping good.  She denies any mania, psychosis, crying spells or any feeling of hopelessness or worthlessness.  Her appetite is okay.  Her weight is stable.  She denies any suicidal thoughts.  She denies any panic attack.  She has no tremor or shakes or any EPS.  Her attention concentration is okay.  She is able to do multitasking.  She is no longer taking pain medicine which was given 3 months ago.  She wants to keep the current medication.  Past Psychiatric History: H/O taking antidepressants since 1990.  H/O inpatient twice, one in 2005 and in 2014. H/O cocaine use, paranoia, hallucination, mood swing, anger and mania. No h/o suicidal attempt. Tried Zoloft, Paxil, Lexapro, Wellbutrin, lithium, Remeron, Topamax, Abilify, Pristiq, Effexor, amitriptyline, Xanax, temazepam, Valium and Cymbalta.  PCP prscribed Adderall for ADD.       Outpatient Encounter Medications as of 03/13/2023  Medication Sig   clindamycin (CLEOCIN) 300 MG capsule Take 1 capsule (300 mg total) by mouth 3 (three) times daily for 10 days.   amphetamine-dextroamphetamine (ADDERALL) 10 MG tablet Take 2 in am and 2 at noon   atorvastatin (LIPITOR) 20 MG tablet Take 1 tablet (20 mg total) by mouth daily.   busPIRone (BUSPAR) 30 MG tablet Take 1 tablet (30 mg total) by mouth 2 (two) times daily.   chlorhexidine (HIBICLENS) 4 % external liquid Apply topically daily as needed.   doxycycline (VIBRA-TABS) 100 MG tablet Take 1 tablet (100 mg total) by mouth 2 (two) times daily.   estradiol (ESTRACE) 0.5 MG tablet Take 1 tablet (0.5 mg total) by mouth daily.   FLUoxetine (PROZAC) 40 MG capsule Take 1 capsule (40 mg total) by mouth daily.   linaclotide (LINZESS) 290 MCG CAPS capsule TAKE 1 CAPSULE BY MOUTH EVERY DAY BEFORE BREAKFAST   meclizine (ANTIVERT) 25 MG tablet TAKE 1 TABLET BY MOUTH TWICE A DAY AS NEEDED FOR DIZZINESS   mupirocin ointment (BACTROBAN) 2 % Place 1 Application into the nose 2 (two) times daily.   oxybutynin (DITROPAN) 5 MG tablet Take 5 mg by mouth daily.   Potassium Citrate 15 MEQ (1620 MG) TBCR Take 1 tablet by mouth in the morning and at bedtime.   promethazine (PHENERGAN) 25 MG tablet Take 1 tablet (25 mg total) by mouth every 8 (eight) hours as needed for nausea or vomiting.   propranolol ER (INDERAL LA) 80 MG 24 hr capsule TAKE 1  CAPSULE BY MOUTH EVERY DAY   QUEtiapine (SEROQUEL) 100 MG tablet Take 1 tablet (100 mg total) by mouth at bedtime.   venlafaxine XR (EFFEXOR-XR) 150 MG 24 hr capsule Take 1 capsule (150 mg total) by mouth daily with breakfast.   [DISCONTINUED] amphetamine-dextroamphetamine (ADDERALL) 10 MG tablet Take 2 in am and 2 at noon   [DISCONTINUED] busPIRone (BUSPAR) 30 MG tablet Take 1 tablet (30 mg total) by mouth 2 (two) times daily.   [DISCONTINUED] FLUoxetine (PROZAC) 40 MG capsule Take 1 capsule (40 mg total) by  mouth daily.   [DISCONTINUED] HYDROcodone-acetaminophen (NORCO) 5-325 MG tablet Take 1 tablet by mouth every 6 (six) hours as needed.   [DISCONTINUED] methocarbamol (ROBAXIN) 500 MG tablet TAKE 1 TABLET BY MOUTH TWICE A DAY AS NEEDED FOR MUSCLE SPASMS   [DISCONTINUED] QUEtiapine (SEROQUEL) 100 MG tablet Take 1 tablet (100 mg total) by mouth at bedtime.   [DISCONTINUED] venlafaxine XR (EFFEXOR-XR) 150 MG 24 hr capsule Take 1 capsule (150 mg total) by mouth daily with breakfast.   [DISCONTINUED] Vitamin D, Ergocalciferol, (DRISDOL) 1.25 MG (50000 UNIT) CAPS capsule Take 1 capsule (50,000 Units total) by mouth every 7 (seven) days.   No facility-administered encounter medications on file as of 03/13/2023.    Recent Results (from the past 2160 hour(s))  Basic metabolic panel     Status: Abnormal   Collection Time: 01/10/23 10:40 AM  Result Value Ref Range   Glucose 86 70 - 99 mg/dL   BUN 13 6 - 24 mg/dL   Creatinine, Ser 1.61 0.57 - 1.00 mg/dL   eGFR 97 >09 UE/AVW/0.98   BUN/Creatinine Ratio 18 9 - 23   Sodium 143 134 - 144 mmol/L   Potassium 3.8 3.5 - 5.2 mmol/L   Chloride 107 (H) 96 - 106 mmol/L   CO2 21 20 - 29 mmol/L   Calcium 9.3 8.7 - 10.2 mg/dL  VITAMIN D 25 Hydroxy (Vit-D Deficiency, Fractures)     Status: None   Collection Time: 01/10/23 10:40 AM  Result Value Ref Range   Vit D, 25-Hydroxy 58.4 30.0 - 100.0 ng/mL    Comment: Vitamin D deficiency has been defined by the Institute of Medicine and an Endocrine Society practice guideline as a level of serum 25-OH vitamin D less than 20 ng/mL (1,2). The Endocrine Society went on to further define vitamin D insufficiency as a level between 21 and 29 ng/mL (2). 1. IOM (Institute of Medicine). 2010. Dietary reference    intakes for calcium and D. [place]: The    [employer]. 2. Holick MF, Binkley Mora, Bischoff-Ferrari HA, et al.    Evaluation, treatment, and prevention of vitamin D    deficiency: an Endocrine  Society clinical practice    guideline. JCEM. 2011 Jul; 96(7):1911-30.      Psychiatric Specialty Exam: Physical Exam  Review of Systems  Weight 191 lb (86.6 kg), last menstrual period 09/02/2018.There is no height or weight on file to calculate BMI.  General Appearance: NA  Eye Contact:  NA  Speech:  Slow  Volume:  Decreased  Mood:  Euthymic  Affect:  NA  Thought Process:  Goal Directed  Orientation:  Full (Time, Place, and Person)  Thought Content:  WDL  Suicidal Thoughts:  No  Homicidal Thoughts:  No  Memory:  Immediate;   Fair Recent;   Fair Remote;   Fair  Judgement:  Fair  Insight:  Present  Psychomotor Activity:  NA  Concentration:  Concentration: Fair and Attention  Span: Fair  Recall:  [employer] of Knowledge:  Good  Language:  Good  Akathisia:  No  Handed:  Right  AIMS (if indicated):     Assets:  Communication Skills Desire for Improvement Housing Social Support Transportation  ADL's:  Intact  Cognition:  WNL  Sleep:  ok     Assessment/Plan: Social anxiety disorder - Plan: FLUoxetine (PROZAC) 40 MG capsule, venlafaxine XR (EFFEXOR-XR) 150 MG 24 hr capsule, busPIRone (BUSPAR) 30 MG tablet  Major depressive disorder, recurrent, severe w/o psychotic behavior (HCC) - Plan: QUEtiapine (SEROQUEL) 100 MG tablet, venlafaxine XR (EFFEXOR-XR) 150 MG 24 hr capsule, busPIRone (BUSPAR) 30 MG tablet  Attention deficit hyperactivity disorder (ADHD), predominantly inattentive type - Plan: amphetamine-dextroamphetamine (ADDERALL) 10 MG tablet  Patient is stable on current medication.  She does not want to change the medication.  Continue Seroquel 100 mg at bedtime, BuSpar 30 mg daily, venlafaxine 150 mg daily, Prozac 40 mg daily and Adderall 10 mg 2 pills in the morning and 2 pills in the afternoon.  She is no longer taking narcotic pain medication and muscle relaxant.  Discussed medication side effects and benefits.  Recommended to call us back if she has any question or  any concern.  Follow-up in 3 months.   Follow Up Instructions:     I discussed the assessment and treatment plan with the patient. The patient was provided an opportunity to ask questions and all were answered. The patient agreed with the plan and demonstrated an understanding of the instructions.   The patient was advised to call back or seek an in-person evaluation if the symptoms worsen or if the condition fails to improve as anticipated.    Collaboration of Care: Other provider involved in patient's care AEB Notes are available in epic to review  Patient/Guardian was advised Release of Information must be obtained prior to any record release in order to collaborate their care with an outside provider. Patient/Guardian was advised if they have not already done so to contact the registration department to sign all necessary forms in order for [place] to release information regarding their care.   Consent: Patient/Guardian gives verbal consent for treatment and assignment of benefits for services provided during this visit. Patient/Guardian expressed understanding and agreed to proceed.     I provided 18 minutes of non face to face time during this encounter.  Note: This document was prepared by [employer] voice dictation technology and any errors that results from this process are unintentional.    Cleotis Nipper, MD 03/13/2023

## 2023-03-15 ENCOUNTER — Other Ambulatory Visit: Payer: Self-pay | Admitting: Medical

## 2023-03-16 ENCOUNTER — Other Ambulatory Visit: Payer: Self-pay | Admitting: Medical

## 2023-03-16 MED ORDER — VEOZAH 45 MG PO TABS: 1.0000 | ORAL_TABLET | Freq: Every day | ORAL | 2 refills | Status: DC

## 2023-03-16 NOTE — Telephone Encounter (Signed)
This is not helping and she would like to go back on [place] and see if her insurance will pay for it

## 2023-03-20 ENCOUNTER — Telehealth: Payer: Self-pay | Admitting: Medical

## 2023-03-20 NOTE — Telephone Encounter (Signed)
Returned pt's call regarding her P.A. for [facility] Left message

## 2023-03-22 ENCOUNTER — Encounter: Payer: Self-pay | Admitting: Medical

## 2023-03-22 NOTE — Telephone Encounter (Signed)
Appeal letter typed for [facility] & faxed

## 2023-03-23 NOTE — Telephone Encounter (Signed)
PA completed.

## 2023-03-26 NOTE — Telephone Encounter (Signed)
Additional questions answered & faxed to [facility]

## 2023-03-30 NOTE — Telephone Encounter (Signed)
Appeal approved til 11/06/23, pt informed, faxed pharmacy

## 2023-04-06 ENCOUNTER — Other Ambulatory Visit (HOSPITAL_COMMUNITY): Payer: Self-pay | Admitting: Psychiatry

## 2023-04-06 DIAGNOSIS — F332 Major depressive disorder, recurrent severe without psychotic features: Secondary | ICD-10-CM

## 2023-04-06 DIAGNOSIS — F4010 Social phobia, unspecified: Secondary | ICD-10-CM

## 2023-04-11 ENCOUNTER — Telehealth: Payer: Self-pay | Admitting: Psychiatry

## 2023-04-11 MED ORDER — MECLIZINE HCL 25 MG PO TABS: ORAL_TABLET | ORAL | 3 refills | Status: DC

## 2023-04-11 NOTE — Telephone Encounter (Signed)
Meds ordered this encounter  Medications   meclizine (ANTIVERT) 25 MG tablet    Sig: TAKE 1 TABLET BY MOUTH TWICE A DAY AS NEEDED FOR DIZZINESS    Dispense:  60 tablet    Refill:  3    Suanne Marker, MD 04/11/2023, 2:24 PM Certified in Neurology, Neurophysiology and Neuroimaging  [facility] [address] [place], [place] [address] [phone]

## 2023-04-11 NOTE — Telephone Encounter (Signed)
Reviewed pt chart. Last seen 08/09/22 by Dr. Delena Bali and has next follow up with SS,NP 08/23/23.   Last ref[facility] sent to [facility]azine [facility]/14/22 #30, 6 refills (too soon to send in refill) [facility] propranolol 03/05/23 #30, 5 refills (too soon to send in refill)  Last meclizine refill sent 11/09/22 #60, 3 refills. No mention in last office note if pt were to continue this medication or not.

## 2023-04-11 NOTE — Telephone Encounter (Signed)
Pt is requesting a refill for meclizine (ANTIVERT) 25 MG tablet,promethazine (PHENERGAN) 25 MG tablet,propranolol ER (INDERAL LA) 80 MG 24 hr capsule .  [phone]

## 2023-04-13 ENCOUNTER — Other Ambulatory Visit: Payer: Self-pay | Admitting: Medical

## 2023-04-17 ENCOUNTER — Telehealth: Payer: Self-pay | Admitting: Medical

## 2023-04-17 NOTE — Telephone Encounter (Signed)
Pt needs a refill on her lin[facility] to [id number] - [place],  - [address]

## 2023-04-17 NOTE — Telephone Encounter (Signed)
I called pharmacy and asked them to fill.

## 2023-04-30 ENCOUNTER — Ambulatory Visit (INDEPENDENT_AMBULATORY_CARE_PROVIDER_SITE_OTHER): Payer: Medicare HMO | Admitting: Family Medicine

## 2023-04-30 ENCOUNTER — Encounter: Payer: Self-pay | Admitting: Family Medicine

## 2023-04-30 VITALS — BP 124/80 | HR 84 | Temp 97.8°F | Ht 67.5 in | Wt 193.2 lb

## 2023-04-30 DIAGNOSIS — S70361A Insect bite (nonvenomous), right thigh, initial encounter: Secondary | ICD-10-CM

## 2023-04-30 DIAGNOSIS — W57XXXA Bitten or stung by nonvenomous insect and other nonvenomous arthropods, initial encounter: Secondary | ICD-10-CM | POA: Diagnosis not present

## 2023-04-30 MED ORDER — TRIAMCINOLONE ACETONIDE 0.1 % EX CREA
TOPICAL_CREAM | CUTANEOUS | 0 refills | Status: AC
Start: 2023-04-30 — End: ?

## 2023-04-30 NOTE — Progress Notes (Signed)
Chief Complaint  Patient presents with   Mass    Bump/rash on inside of right thigh that started as a small bump about a week ago. Itchy and very painful. Usually sees Rebecca Andrade and she gets these frequently. She said last one was one her buttocks (Celine 2024) and he gave her some doxy and muprirocin ointment as well as hydrocodone for intense pain. Has been cortisone 10 cream OTC for last couple of days.      A week ago she noted something the size of a pinhead at the right medial thigh-it was painful.  Unsure if it was a "blister or bite or something". It has increased in size, and gotten itchy and painful over the course of the week. Cortisone cream hasn't helped very much.  Not aware of any insect bites, stings, ticks. Not been camping. Just walking the dog.  H/o MRSA  PMH, PSH, SH reviewed  Outpatient Encounter Medications as of 04/30/2023  Medication Sig Note   amphetamine-dextroamphetamine (ADDERALL) 10 MG tablet Take 2 in am and 2 at noon    atorvastatin (LIPITOR) 20 MG tablet Take 1 tablet (20 mg total) by mouth daily.    busPIRone (BUSPAR) 30 MG tablet Take 1 tablet (30 mg total) by mouth 2 (two) times daily.    Fezolinetant (VEOZAH) 45 MG TABS Take 1 tablet (45 mg total) by mouth daily.    FLUoxetine (PROZAC) 40 MG capsule Take 1 capsule (40 mg total) by mouth daily.    linaclotide (LINZESS) 290 MCG CAPS capsule TAKE 1 CAPSULE BY MOUTH EVERY DAY BEFORE BREAKFAST    oxybutynin (DITROPAN) 5 MG tablet Take 5 mg by mouth daily.    Potassium Citrate 15 MEQ (1620 MG) TBCR Take 1 tablet by mouth in the morning and at bedtime.    propranolol ER (INDERAL LA) 80 MG 24 hr capsule TAKE 1 CAPSULE BY MOUTH EVERY DAY    QUEtiapine (SEROQUEL) 100 MG tablet Take 1 tablet (100 mg total) by mouth at bedtime.    venlafaxine XR (EFFEXOR-XR) 150 MG 24 hr capsule Take 1 capsule (150 mg total) by mouth daily with breakfast.    [DISCONTINUED] estradiol (ESTRACE) 0.5 MG tablet TAKE 1 TABLET BY MOUTH EVERY  DAY    chlorhexidine (HIBICLENS) 4 % external liquid Apply topically daily as needed. (Patient not taking: Reported on 04/30/2023) 04/30/2023: Given by Rebecca Andrade with last cellulitis in [place]   doxycycline (VIBRA-TABS) 100 MG tablet Take 1 tablet (100 mg total) by mouth 2 (two) times daily. (Patient not taking: Reported on 04/30/2023) 04/30/2023: Given by Rebecca Andrade with last cellulitis in [place]    meclizine (ANTIVERT) 25 MG tablet TAKE 1 TABLET BY MOUTH TWICE A DAY AS NEEDED FOR DIZZINESS (Patient not taking: Reported on 04/30/2023) 04/30/2023: As needed   mupirocin ointment (BACTROBAN) 2 % Place 1 Application into the nose 2 (two) times daily. (Patient not taking: Reported on 04/30/2023) 04/30/2023: Given by Rebecca Andrade with last cellulitis in [place]    promethazine (PHENERGAN) 25 MG tablet Take 1 tablet (25 mg total) by mouth every 8 (eight) hours as needed for nausea or vomiting. (Patient not taking: Reported on 04/30/2023) 04/30/2023: As needed   No facility-administered encounter medications on file as of 04/30/2023.   No Known Allergies  ROS: No fever, chills, myalgias, arthralgias. No URI symptoms, chest pain, shortness of breath. Some nausea, no vomiting or diarrhea. Denies rashes elsewhere.   PHYSICAL EXAM:  BP 124/80   Pulse 84   Temp 97.8 F (36.6 C) (  Tympanic)   Ht 5' 7.5" (1.715 m)   Wt 193 lb 3.2 oz (87.6 kg)   LMP 09/02/2018   BMI 29.81 kg/m   Well-appearing female, in no distress  R medial thigh:  3.5 x 3 cm round area of erythema with some small papules within this area.  The center is somewhat violaceous (purple), with slight scab noted on microscopic eval. No induratoin.  Slightly warm. No crusting, no vesicles, no streaks   ASSESSMENT/PLAN:  Insect bite of right thigh, initial encounter - Suspect bite based on appearance/history. No e/o infection. Treat for local reaction--antihistamines and topical steroid. S/sx infection reviewed - Plan: triamcinolone cream (KENALOG) 0.1  %   Use the cortisone cream twice daily.  Use it sparingly, only to the affected area of skin. Use it until it is better (never more than 2 weeks). If you notice the area continuing to grow and change, you may need to be re-evaluated. If you develop a fever, if the redness is growing, if you see red streaks forming, or if you develop an abscess (similar to the ones you've had in the past), please return for re-evaluation.  You may use cool compresses to help with the itching and pain.  Take an antihistamine such as claritin or allegra or zyrtec. This should help with itching, as well as treating any allergic reaction.

## 2023-04-30 NOTE — Patient Instructions (Signed)
Use the cortisone cream twice daily.  Use it sparingly, only to the affected area of skin. Use it until it is better (never more than 2 weeks). If you notice the area continuing to grow and change, you may need to be re-evaluated. If you develop a fever, if the redness is growing, if you see red streaks forming, or if you develop an abscess (similar to the ones you've had in the past), please return for re-evaluation.  You may use cool compresses to help with the itching and pain.  Take an antihistamine such as claritin or allegra or zyrtec. This should help with itching, as well as treating any allergic reaction.

## 2023-05-09 ENCOUNTER — Other Ambulatory Visit: Payer: Self-pay | Admitting: Medical

## 2023-05-09 NOTE — Telephone Encounter (Signed)
Is it ok to refill? This medication is not listed on active medication list.

## 2023-05-09 NOTE — Telephone Encounter (Signed)
I called and spoke with patient. Yes, she says she needs this medication refilled. Please review refill request. I can't sign off on this refill.

## 2023-05-16 ENCOUNTER — Telehealth (HOSPITAL_COMMUNITY): Payer: Self-pay | Admitting: *Deleted

## 2023-05-16 DIAGNOSIS — F900 Attention-deficit hyperactivity disorder, predominantly inattentive type: Secondary | ICD-10-CM

## 2023-05-16 NOTE — Telephone Encounter (Signed)
Pt called to request a refill of the Adderall IR 20 mg BID. Fill date is 06/18/23. Pt next appointment is on 06/22/23. Please review.

## 2023-05-17 ENCOUNTER — Telehealth (HOSPITAL_COMMUNITY): Payer: Self-pay | Admitting: *Deleted

## 2023-05-17 MED ORDER — AMPHETAMINE-DEXTROAMPHETAMINE 10 MG PO TABS: ORAL_TABLET | ORAL | 0 refills | Status: DC

## 2023-05-17 NOTE — Telephone Encounter (Signed)
Not sure about fill date 06/18/23.  She is due and I sent a new prescription to her pharmacy

## 2023-05-17 NOTE — Telephone Encounter (Signed)
Writer attempted to contact pt yesterday, 05/17/23, and this morning to notify that Adderall prescription had been sent to her pharmacy by Dr. Lolly Mustache, however pt VM full.

## 2023-06-06 ENCOUNTER — Other Ambulatory Visit (HOSPITAL_COMMUNITY): Payer: Self-pay | Admitting: Psychiatry

## 2023-06-06 DIAGNOSIS — F4010 Social phobia, unspecified: Secondary | ICD-10-CM

## 2023-06-06 DIAGNOSIS — F332 Major depressive disorder, recurrent severe without psychotic features: Secondary | ICD-10-CM

## 2023-06-11 ENCOUNTER — Other Ambulatory Visit (HOSPITAL_COMMUNITY): Payer: Self-pay

## 2023-06-11 ENCOUNTER — Telehealth (HOSPITAL_COMMUNITY): Payer: Self-pay

## 2023-06-11 DIAGNOSIS — F4010 Social phobia, unspecified: Secondary | ICD-10-CM

## 2023-06-11 DIAGNOSIS — F332 Major depressive disorder, recurrent severe without psychotic features: Secondary | ICD-10-CM

## 2023-06-11 MED ORDER — BUSPIRONE HCL 30 MG PO TABS: 30.0000 mg | ORAL_TABLET | Freq: Two times a day (BID) | ORAL | 0 refills | Status: DC

## 2023-06-11 MED ORDER — VENLAFAXINE HCL ER 150 MG PO CP24
150.0000 mg | ORAL_CAPSULE | Freq: Every day | ORAL | 0 refills | Status: DC
Start: 2023-06-11 — End: 2023-06-12

## 2023-06-11 MED ORDER — FLUOXETINE HCL 40 MG PO CAPS
40.0000 mg | ORAL_CAPSULE | Freq: Every day | ORAL | 0 refills | Status: AC
Start: 2023-06-11 — End: 2023-09-09

## 2023-06-11 MED ORDER — QUETIAPINE FUMARATE 100 MG PO TABS
100.0000 mg | ORAL_TABLET | Freq: Every day | ORAL | 0 refills | Status: DC
Start: 2023-06-11 — End: 2023-06-12

## 2023-06-11 NOTE — Telephone Encounter (Signed)
Patient is calling because she took her last Prozac and Effexor this morning. She has a follow up tomorrow so I was not sure if you wanted me to send in a 30 day or just wait for her appointment tomorrow. Please review and advise, thank you

## 2023-06-11 NOTE — Telephone Encounter (Signed)
I sent a 30-day prescription of Prozac, Effexor, BuSpar and Seroquel.

## 2023-06-12 ENCOUNTER — Telehealth (HOSPITAL_COMMUNITY): Payer: Medicare HMO | Admitting: Psychiatry

## 2023-06-12 ENCOUNTER — Telehealth (HOSPITAL_COMMUNITY): Payer: Self-pay

## 2023-06-12 ENCOUNTER — Other Ambulatory Visit (HOSPITAL_COMMUNITY): Payer: Self-pay

## 2023-06-12 DIAGNOSIS — F332 Major depressive disorder, recurrent severe without psychotic features: Secondary | ICD-10-CM

## 2023-06-12 DIAGNOSIS — F4010 Social phobia, unspecified: Secondary | ICD-10-CM

## 2023-06-12 DIAGNOSIS — F900 Attention-deficit hyperactivity disorder, predominantly inattentive type: Secondary | ICD-10-CM

## 2023-06-12 MED ORDER — VENLAFAXINE HCL ER 150 MG PO CP24
150.0000 mg | ORAL_CAPSULE | Freq: Every day | ORAL | 0 refills | Status: DC
Start: 2023-06-12 — End: 2023-07-16

## 2023-06-12 MED ORDER — QUETIAPINE FUMARATE 100 MG PO TABS
100.0000 mg | ORAL_TABLET | Freq: Every day | ORAL | 1 refills | Status: DC
Start: 2023-06-12 — End: 2023-07-16

## 2023-06-12 MED ORDER — FLUOXETINE HCL 40 MG PO CAPS: 40.0000 mg | ORAL_CAPSULE | Freq: Every day | ORAL | 0 refills | Status: DC

## 2023-06-12 NOTE — Telephone Encounter (Signed)
Patients appointment was rescheduled, I sent a 30 day order for her medications but can not send her Adderall, patient states she is not due until the 12th. Please review and advise, thank you

## 2023-06-13 MED ORDER — AMPHETAMINE-DEXTROAMPHETAMINE 10 MG PO TABS
ORAL_TABLET | ORAL | 0 refills | Status: DC
Start: 2023-06-13 — End: 2023-07-16

## 2023-06-13 NOTE — Telephone Encounter (Signed)
Done

## 2023-06-21 ENCOUNTER — Other Ambulatory Visit: Payer: Self-pay | Admitting: Medical

## 2023-07-02 ENCOUNTER — Other Ambulatory Visit: Payer: Self-pay | Admitting: Medical

## 2023-07-02 DIAGNOSIS — Z1231 Encounter for screening mammogram for malignant neoplasm of breast: Secondary | ICD-10-CM

## 2023-07-16 ENCOUNTER — Telehealth (HOSPITAL_COMMUNITY): Payer: Self-pay | Admitting: Psychiatry

## 2023-07-16 ENCOUNTER — Encounter (HOSPITAL_COMMUNITY): Payer: Self-pay | Admitting: Psychiatry

## 2023-07-16 ENCOUNTER — Telehealth (HOSPITAL_BASED_OUTPATIENT_CLINIC_OR_DEPARTMENT_OTHER): Payer: Medicare HMO | Admitting: Psychiatry

## 2023-07-16 VITALS — Wt 190.0 lb

## 2023-07-16 DIAGNOSIS — F900 Attention-deficit hyperactivity disorder, predominantly inattentive type: Secondary | ICD-10-CM | POA: Diagnosis not present

## 2023-07-16 DIAGNOSIS — F4010 Social phobia, unspecified: Secondary | ICD-10-CM

## 2023-07-16 DIAGNOSIS — F332 Major depressive disorder, recurrent severe without psychotic features: Secondary | ICD-10-CM | POA: Diagnosis not present

## 2023-07-16 MED ORDER — QUETIAPINE FUMARATE 100 MG PO TABS: 100.0000 mg | ORAL_TABLET | Freq: Every day | ORAL | 2 refills | Status: DC

## 2023-07-16 MED ORDER — FLUOXETINE HCL 40 MG PO CAPS: 40.0000 mg | ORAL_CAPSULE | Freq: Every day | ORAL | 2 refills | Status: DC

## 2023-07-16 MED ORDER — AMPHETAMINE-DEXTROAMPHETAMINE 10 MG PO TABS
ORAL_TABLET | ORAL | 0 refills | Status: DC
Start: 2023-07-16 — End: 2023-08-14

## 2023-07-16 MED ORDER — BUSPIRONE HCL 30 MG PO TABS: 30.0000 mg | ORAL_TABLET | Freq: Every day | ORAL | 2 refills | Status: DC

## 2023-07-16 MED ORDER — VENLAFAXINE HCL ER 150 MG PO CP24: 150.0000 mg | ORAL_CAPSULE | Freq: Every day | ORAL | 2 refills | Status: DC

## 2023-07-16 MED ORDER — BUSPIRONE HCL 30 MG PO TABS: 30.0000 mg | ORAL_TABLET | Freq: Two times a day (BID) | ORAL | 2 refills | Status: DC

## 2023-07-16 NOTE — Progress Notes (Signed)
[facility] MD Virtual Progress Note   Patient Location: Home Provider Location: Home Office  I connect with patient by telephone and verified that I am speaking with correct person by using two identifiers. I discussed the limitations of evaluation and management by telemedicine and the availability of in person appointments. I also discussed with the patient that there may be a patient responsible charge related to this service. The patient expressed understanding and agreed to proceed.  Rebecca Andrade [id number] 52 y.o.  07/16/2023 9:43 AM  History of Present Illness:  Patient is evaluated by phone session.  She admitted ruminative thoughts because sometimes she feels boredom, sad, depressed.  She feels the current medicine is working but her chronic depression has flared up.  He does go outside with her dog for a walk but she does not have any friends or socialization.  She does talk to her son who is very supportive but he is busy working.  She is concerned about her 63 year old mother who lives by herself and continues to drink.  Patient had communicated with her a few times to stop drinking but her mother does not stop drinking.  She denies any mania, psychosis, crying spells or any feeling of hopelessness but admitted chronic depression sometimes flares up when she does not have any people around.  We have offered therapy in the past but this time she is serious about considering referral for therapy.  Attention concentration is fair but she feels the stimulant helped her ADHD.  She admitted not comfortable around people in public places.  She has no tremor or shakes or any EPS.  She is trying to lose weight and she had lost few pounds since the last visit.  She has not seen primary care in a while but like to have appointment coming up soon.  She denies drinking or using any illegal substances.  She lives with her son and his girlfriend.  Past Psychiatric History: H/O taking  antidepressants since 1990.  H/O inpatient twice, one in 2005 and in 2014. H/O cocaine use, paranoia, hallucination, mood swing, anger and mania. No h/o suicidal attempt. Tried Zoloft, Paxil, Lexapro, Wellbutrin, lithium, Remeron, Topamax, Abilify, Pristiq, Effexor, amitriptyline, Xanax, temazepam, Valium and Cymbalta.  PCP prscribed Adderall for ADD.      Outpatient Encounter Medications as of 07/16/2023  Medication Sig   amphetamine-dextroamphetamine (ADDERALL) 10 MG tablet Take 2 in am and 2 at noon   atorvastatin (LIPITOR) 20 MG tablet Take 1 tablet (20 mg total) by mouth daily.   busPIRone (BUSPAR) 30 MG tablet Take 1 tablet (30 mg total) by mouth 2 (two) times daily.   chlorhexidine (HIBICLENS) 4 % external liquid Apply topically daily as needed. (Patient not taking: Reported on 04/30/2023)   doxycycline (VIBRA-TABS) 100 MG tablet Take 1 tablet (100 mg total) by mouth 2 (two) times daily. (Patient not taking: Reported on 04/30/2023)   Fezolinetant (VEOZAH) 45 MG TABS Take 1 tablet (45 mg total) by mouth daily.   FLUoxetine (PROZAC) 40 MG capsule Take 1 capsule (40 mg total) by mouth daily.   linaclotide (LINZESS) 290 MCG CAPS capsule TAKE 1 CAPSULE BY MOUTH EVERY DAY BEFORE BREAKFAST   meclizine (ANTIVERT) 25 MG tablet TAKE 1 TABLET BY MOUTH TWICE A DAY AS NEEDED FOR DIZZINESS (Patient not taking: Reported on 04/30/2023)   methocarbamol (ROBAXIN) 500 MG tablet TAKE 1 TABLET BY MOUTH TWICE A DAY AS NEEDED FOR MUSCLE SPASMS   mupirocin ointment (BACTROBAN) 2 % Place  1 Application into the nose 2 (two) times daily. (Patient not taking: Reported on 04/30/2023)   oxybutynin (DITROPAN) 5 MG tablet Take 5 mg by mouth daily.   Potassium Citrate 15 MEQ (1620 MG) TBCR Take 1 tablet by mouth in the morning and at bedtime.   promethazine (PHENERGAN) 25 MG tablet Take 1 tablet (25 mg total) by mouth every 8 (eight) hours as needed for nausea or vomiting. (Patient not taking: Reported on 04/30/2023)    propranolol ER (INDERAL LA) 80 MG 24 hr capsule TAKE 1 CAPSULE BY MOUTH EVERY DAY   QUEtiapine (SEROQUEL) 100 MG tablet Take 1 tablet (100 mg total) by mouth at bedtime.   triamcinolone cream (KENALOG) 0.1 % Apply sparingly to affected area of skin, twice daily until resolved. Do not use more than 2 weeks   venlafaxine XR (EFFEXOR-XR) 150 MG 24 hr capsule Take 1 capsule (150 mg total) by mouth daily with breakfast.   No facility-administered encounter medications on file as of 07/16/2023.    No results found for this or any previous visit (from the past 2160 hour(s)).   Psychiatric Specialty Exam: Physical Exam  Review of Systems  Weight 190 lb (86.2 kg), last menstrual period 09/02/2018.There is no height or weight on file to calculate BMI.  General Appearance: NA  Eye Contact:  NA  Speech:  Slow  Volume:  Decreased  Mood:  Anxious  Affect:  NA  Thought Process:  Descriptions of Associations: Intact  Orientation:  Full (Time, Place, and Person)  Thought Content:  Logical  Suicidal Thoughts:  No  Homicidal Thoughts:  No  Memory:  Immediate;   Good Recent;   Fair Remote;   Fair  Judgement:  Intact  Insight:  Present  Psychomotor Activity:  NA  Concentration:  Concentration: Fair and Attention Span: Fair  Recall:  [employer] of Knowledge:  Fair  Language:  Fair  Akathisia:  No  Handed:  Right  AIMS (if indicated):     Assets:  Communication Skills Desire for Improvement Housing Transportation  ADL's:  Intact  Cognition:  WNL  Sleep:  ok     Assessment/Plan: Major depressive disorder, recurrent, severe w/o psychotic behavior (HCC) - Plan: busPIRone (BUSPAR) 30 MG tablet, QUEtiapine (SEROQUEL) 100 MG tablet, venlafaxine XR (EFFEXOR-XR) 150 MG 24 hr capsule  Attention deficit hyperactivity disorder (ADHD), predominantly inattentive type - Plan: amphetamine-dextroamphetamine (ADDERALL) 10 MG tablet  Social anxiety disorder - Plan: busPIRone (BUSPAR) 30 MG tablet,  FLUoxetine (PROZAC) 40 MG capsule, venlafaxine XR (EFFEXOR-XR) 150 MG 24 hr capsule  I discussed polypharmacy as patient is already taking Seroquel, BuSpar, venlafaxine, Prozac and Adderall.  Despite taking multiple medication she still have movements of depression.  I do believe she need to consider therapy seriously to help her coping skills.  Due to her social anxiety she does not like to be around people.  Patient agreed to consider therapy.  Continue Prozac 40 mg daily, Adderall 20 mg in the morning and 20 mg in the afternoon, venlafaxine 150 mg daily, Seroquel 100 mg at bedtime and BuSpar 30 mg daily.  Recommend to call us back if he has any question or any concern.  Follow-up in 3 months.  Encourage to see primary care.     Follow Up Instructions:     I discussed the assessment and treatment plan with the patient. The patient was provided an opportunity to ask questions and all were answered. The patient agreed with the plan and demonstrated an  understanding of the instructions.   The patient was advised to call back or seek an in-person evaluation if the symptoms worsen or if the condition fails to improve as anticipated.    Collaboration of Care: Other provider involved in patient's care AEB notes are available in epic to review.  Patient/Guardian was advised Release of Information must be obtained prior to any record release in order to collaborate their care with an outside provider. Patient/Guardian was advised if they have not already done so to contact the registration department to sign all necessary forms in order for [place] to release information regarding their care.   Consent: Patient/Guardian gives verbal consent for treatment and assignment of benefits for services provided during this visit. Patient/Guardian expressed understanding and agreed to proceed.     I provided 31 minutes of non face to face time during this encounter.  Note: This document was prepared by [employer]  voice dictation technology and any errors that results from this process are unintentional.    Cleotis Nipper, MD 07/16/2023

## 2023-07-16 NOTE — Telephone Encounter (Signed)
Patient was called to get therapy appointment but patient said that she will call back to schedule appointment.

## 2023-07-27 ENCOUNTER — Other Ambulatory Visit: Payer: Self-pay | Admitting: Diagnostic Neuroimaging

## 2023-07-31 ENCOUNTER — Ambulatory Visit: Payer: Medicare HMO

## 2023-07-31 DIAGNOSIS — Z0000 Encounter for general adult medical examination without abnormal findings: Secondary | ICD-10-CM

## 2023-07-31 NOTE — Addendum Note (Signed)
Addended by: Elisha Ponder E on: 07/31/2023 01:06 PM   Modules accepted: Orders

## 2023-07-31 NOTE — Patient Instructions (Signed)
Rebecca Andrade , Thank you for taking time to come for your Medicare Wellness Visit. I appreciate your ongoing commitment to your health goals. Please review the following plan we discussed and let me know if I can assist you in the future.   Referrals/Orders/Follow-Ups/Clinician Recommendations: none  This is a list of the screening recommended for you and due dates:  Health Maintenance  Topic Date Due   Flu Shot  06/07/2023   Zoster (Shingles) Vaccine (2 of 2) 06/29/2023   COVID-19 Vaccine (4 - 2023-24 season) 07/08/2023   Medicare Annual Wellness Visit  07/30/2024   Mammogram  08/04/2024   Colon Cancer Screening  08/13/2024   Pap with HPV screening  09/09/2026   DTaP/Tdap/Td vaccine (2 - Td or Tdap) 05/28/2028   Hepatitis C Screening  Completed   HIV Screening  Completed   HPV Vaccine  Aged Out    Advanced directives: (In Chart) A copy of your advanced directives are scanned into your chart should your provider ever need it.  Next Medicare Annual Wellness Visit scheduled for next year: Yes  insert Preventive Care Attachment Reference
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
## 2023-08-02 ENCOUNTER — Telehealth: Payer: Self-pay | Admitting: *Deleted

## 2023-08-02 NOTE — Progress Notes (Signed)
Care Coordination   Note   08/02/2023 Name: Trace CHARLENE HASBUN MRN: [id number] DOB: 08-Jul-1971  Sharel S Kapela is a 52 y.o. year old female who sees Tysinger, Kermit Balo, PA-C for primary care. I reached out to Zakeria S Colledge by phone today to offer care coordination services.  Ms. Brienza was given information about Care Coordination services today including:   The Care Coordination services include support from the care team which includes your Nurse Coordinator, Clinical Social Worker, or Pharmacist.  The Care Coordination team is here to help remove barriers to the health concerns and goals most important to you. Care Coordination services are voluntary, and the patient may decline or stop services at any time by request to their care team member.   Care Coordination Consent Status: Patient agreed to services and verbal consent obtained.   Follow up plan:  Telephone appointment with care coordination team member scheduled for:  08/07/23  Encounter Outcome:  Patient Scheduled  [facility] Coordination Care Guide  Direct Dial: [phone]

## 2023-08-06 ENCOUNTER — Ambulatory Visit: Payer: Medicare HMO

## 2023-08-07 ENCOUNTER — Ambulatory Visit: Payer: Self-pay

## 2023-08-07 NOTE — Patient Instructions (Signed)
Visit Information  Thank you for taking time to visit with me today. Please don't hesitate to contact me if I can be of assistance to you.   Following are the goals we discussed today:   Goals Addressed             This Visit's Progress    Care coordination activities       Care Coordination Interventions: Patient stated that she has applied for food stamps and could not get them because she was over the income. Patient stated that she is in need of assistance with food from time to time and agreed to receiving a list of food pantries from the SW. Patient denied any concerns with any medication cost, SW advised for patient to contact provider with any concerns in the future. Patient having any transportation barriers.  SW will send a list of the food pantries to the patient.           Our next appointment is by telephone on 08/29/2023 at 10:30 am  Please call the care guide team at [phone] if you need to cancel or reschedule your appointment.   If you are experiencing a Mental Health or Behavioral Health Crisis or need someone to talk to, please call 1-800-273-TALK (toll free, 24 hour hotline) go to [facility] [address] [place] [phone])  The patient verbalized understanding of instructions, educational materials, and care plan provided today and DECLINED offer to receive copy of patient instructions, educational materials, and care plan.   Bevelyn Ngo, BSW, CDP [facility]  [facility], [facility] Social Worker Direct Dial: [phone]  Fax: [phone]

## 2023-08-07 NOTE — Patient Outreach (Signed)
Care Coordination   Initial Visit Note   08/07/2023 Name: Rebecca Andrade MRN: [id number] DOB: [birth year]  Rebecca Andrade is a 52 y.o. year old female who sees Tysinger, Kermit Balo, PA-C for primary care. I spoke with  Ritamarie S Ent by phone today.  What matters to the patients health and wellness today?  Identified food resources to assist with food insecurities     Goals Addressed             This Visit's Progress    Care coordination activities       Care Coordination Interventions: Patient stated that she has applied for food stamps and could not get them because she was over the income. Patient stated that she is in need of assistance with food from time to time and agreed to receiving a list of food pantries from the SW. Patient denied any concerns with any medication cost, SW advised for patient to contact provider with any concerns in the future. Patient having any transportation barriers.  SW will send a list of the food pantries to the patient.           SDOH assessments and interventions completed:  Yes  SDOH Interventions Today    Flowsheet Row Most Recent Value  SDOH Interventions   Food Insecurity Interventions Other (Comment)  [SW send a list for food pantries]  Transportation Interventions Intervention Not Indicated        Care Coordination Interventions:  Yes, provided  Interventions Today    Flowsheet Row Most Recent Value  General Interventions   General Interventions Discussed/Reviewed General Interventions Discussed, Community Resources  [SW provided food pantry list]  Education Interventions   Education Provided Provided Printed Education  Pharmacy Interventions   Pharmacy Dicussed/Reviewed Pharmacy Topics Discussed  [Patient did not have any pharmacy needs at this time]        Follow up plan: Follow up call scheduled for 08/29/2023 at 10:30 am    Encounter Outcome:  Patient Visit Completed   Bevelyn Ngo, BSW, CDP [facility]   [facility], [facility] Social Worker Direct Dial: [phone]  Fax: [phone]

## 2023-08-08 ENCOUNTER — Ambulatory Visit (INDEPENDENT_AMBULATORY_CARE_PROVIDER_SITE_OTHER): Payer: Medicare HMO | Admitting: Medical

## 2023-08-08 VITALS — BP 120/64 | HR 81 | Wt 186.6 lb

## 2023-08-08 DIAGNOSIS — R748 Abnormal levels of other serum enzymes: Secondary | ICD-10-CM

## 2023-08-08 DIAGNOSIS — Z780 Asymptomatic menopausal state: Secondary | ICD-10-CM

## 2023-08-08 DIAGNOSIS — Z0000 Encounter for general adult medical examination without abnormal findings: Secondary | ICD-10-CM

## 2023-08-08 DIAGNOSIS — G43809 Other migraine, not intractable, without status migrainosus: Secondary | ICD-10-CM | POA: Diagnosis not present

## 2023-08-08 DIAGNOSIS — R42 Dizziness and giddiness: Secondary | ICD-10-CM | POA: Diagnosis not present

## 2023-08-08 DIAGNOSIS — E559 Vitamin D deficiency, unspecified: Secondary | ICD-10-CM

## 2023-08-08 DIAGNOSIS — E785 Hyperlipidemia, unspecified: Secondary | ICD-10-CM | POA: Insufficient documentation

## 2023-08-08 DIAGNOSIS — N3281 Overactive bladder: Secondary | ICD-10-CM

## 2023-08-08 DIAGNOSIS — I700 Atherosclerosis of aorta: Secondary | ICD-10-CM | POA: Diagnosis not present

## 2023-08-08 DIAGNOSIS — E782 Mixed hyperlipidemia: Secondary | ICD-10-CM

## 2023-08-08 DIAGNOSIS — Z131 Encounter for screening for diabetes mellitus: Secondary | ICD-10-CM | POA: Insufficient documentation

## 2023-08-08 DIAGNOSIS — Z972 Presence of dental prosthetic device (complete) (partial): Secondary | ICD-10-CM

## 2023-08-08 DIAGNOSIS — N951 Menopausal and female climacteric states: Secondary | ICD-10-CM

## 2023-08-08 DIAGNOSIS — E7800 Pure hypercholesterolemia, unspecified: Secondary | ICD-10-CM

## 2023-08-08 DIAGNOSIS — K5909 Other constipation: Secondary | ICD-10-CM

## 2023-08-08 DIAGNOSIS — Z7185 Encounter for immunization safety counseling: Secondary | ICD-10-CM

## 2023-08-08 MED ORDER — ATORVASTATIN CALCIUM 20 MG PO TABS: 20.0000 mg | ORAL_TABLET | Freq: Every day | ORAL | 3 refills | Status: DC

## 2023-08-08 MED ORDER — VEOZAH 45 MG PO TABS: 1.0000 | ORAL_TABLET | Freq: Every day | ORAL | 0 refills | Status: DC

## 2023-08-08 MED ORDER — LINACLOTIDE 290 MCG PO CAPS: ORAL_CAPSULE | ORAL | 3 refills | Status: DC
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
## 2023-08-09 ENCOUNTER — Other Ambulatory Visit: Payer: Self-pay | Admitting: Medical

## 2023-08-09 DIAGNOSIS — Z79899 Other long term (current) drug therapy: Secondary | ICD-10-CM

## 2023-08-09 DIAGNOSIS — E559 Vitamin D deficiency, unspecified: Secondary | ICD-10-CM

## 2023-08-09 LAB — COMPREHENSIVE METABOLIC PANEL
ALT: 12 [IU]/L (ref 0–32)
AST: 16 [IU]/L (ref 0–40)
Albumin: 3.8 g/dL (ref 3.8–4.9)
Alkaline Phosphatase: 81 [IU]/L (ref 44–121)
BUN/Creatinine Ratio: 13 (ref 9–23)
BUN: 11 mg/dL (ref 6–24)
Bilirubin Total: 0.3 mg/dL (ref 0.0–1.2)
CO2: 21 mmol/L (ref 20–29)
Calcium: 9.1 mg/dL (ref 8.7–10.2)
Chloride: 108 mmol/L — ABNORMAL HIGH (ref 96–106)
Creatinine, Ser: 0.82 mg/dL (ref 0.57–1.00)
Globulin, Total: 2.1 g/dL (ref 1.5–4.5)
Glucose: 93 mg/dL (ref 70–99)
Potassium: 3.9 mmol/L (ref 3.5–5.2)
Sodium: 144 mmol/L (ref 134–144)
Total Protein: 5.9 g/dL — ABNORMAL LOW (ref 6.0–8.5)
eGFR: 86 mL/min/{1.73_m2} (ref >59)

## 2023-08-09 LAB — HEMOGLOBIN A1C
Est. average glucose Bld gHb Est-mCnc: 103 mg/dL
Hgb A1c MFr Bld: 5.2 % (ref 4.8–5.6)

## 2023-08-09 LAB — LIPID PANEL
Cholesterol, Total: 150 mg/dL (ref 100–199)
HDL: 63 mg/dL (ref >39)
LDL CALC COMMENT:: 2.4 ratio (ref 0.0–4.4)
LDL Chol Calc (NIH): 72 mg/dL (ref 0–99)
Triglycerides: 77 mg/dL (ref 0–149)
VLDL Cholesterol Cal: 15 mg/dL (ref 5–40)

## 2023-08-09 LAB — CBC
Hematocrit: 43.5 % (ref 34.0–46.6)
Hemoglobin: 14.7 g/dL (ref 11.1–15.9)
MCH: 31.4 pg (ref 26.6–33.0)
MCHC: 33.8 g/dL (ref 31.5–35.7)
MCV: 93 fL (ref 79–97)
Platelets: 283 10*3/uL (ref 150–450)
RBC: 4.68 x10E6/uL (ref 3.77–5.28)
RDW: 11.9 % (ref 11.7–15.4)
WBC: 7.8 10*3/uL (ref 3.4–10.8)

## 2023-08-09 LAB — VITAMIN D 25 HYDROXY (VIT D DEFICIENCY, FRACTURES): Vit D, 25-Hydroxy: 30.1 ng/mL (ref 30.0–100.0)

## 2023-08-09 NOTE — Progress Notes (Signed)
Vitamin D level is low. I would like you to begin prescription Vitamin D 50,000 IU weekly.  We will plan to check vitamin D periodically along with calcium, such as repeating labs in 3 months.  Foods that contain vitamin D include seafood such as oysters, shrimp, salmon, herring, cod, egg yolks, mushrooms, milk, and foods fortified with Vitamin D such as orange juice, cereals.  Its also important to get some sun exposure regularly to absorb vitamin D.  Other than the vitamin D the rest of the labs are fine.  Normal blood sugar, liver, kidney, blood counts, cholesterol, diabetes screening.  Continue same medicines as usual  Plan to come in for a nurse visit for lab to check the liver and vit D again in 3 months

## 2023-08-10 ENCOUNTER — Telehealth: Payer: Self-pay | Admitting: Medical

## 2023-08-10 MED ORDER — VITAMIN D3 50 MCG (2000 UT) PO CAPS: 2000.0000 [IU] | ORAL_CAPSULE | Freq: Every day | ORAL | 3 refills | Status: DC

## 2023-08-10 NOTE — Telephone Encounter (Signed)
Pt was notified.  

## 2023-08-10 NOTE — Telephone Encounter (Signed)
Pt wants rx sent in for Vitamin D  , she states she wants rx to take once per day not weekly because it is cheaper that way

## 2023-08-14 ENCOUNTER — Telehealth (HOSPITAL_COMMUNITY): Payer: Self-pay

## 2023-08-14 DIAGNOSIS — F900 Attention-deficit hyperactivity disorder, predominantly inattentive type: Secondary | ICD-10-CM

## 2023-08-14 MED ORDER — AMPHETAMINE-DEXTROAMPHETAMINE 10 MG PO TABS
ORAL_TABLET | ORAL | 0 refills | Status: DC
Start: 2023-08-14 — End: 2023-09-14

## 2023-08-14 NOTE — Telephone Encounter (Signed)
Done

## 2023-08-14 NOTE — Telephone Encounter (Signed)
Patient is calling for a medication refill on her Adderall, last filled on 9/9. Patient has a follow up on 12/9. Please review and advise, thank you

## 2023-08-20 ENCOUNTER — Encounter: Payer: Self-pay | Admitting: Neurology

## 2023-08-20 ENCOUNTER — Ambulatory Visit: Payer: Medicare HMO | Admitting: Neurology

## 2023-08-20 VITALS — BP 119/76 | HR 81 | Ht 67.0 in | Wt 189.0 lb

## 2023-08-20 DIAGNOSIS — R42 Dizziness and giddiness: Secondary | ICD-10-CM

## 2023-08-20 DIAGNOSIS — G43809 Other migraine, not intractable, without status migrainosus: Secondary | ICD-10-CM

## 2023-08-20 MED ORDER — VYEPTI 100 MG/ML IV SOLN: 100.0000 mg | INTRAVENOUS | Status: DC

## 2023-08-20 MED ORDER — PROMETHAZINE HCL 25 MG PO TABS: 25.0000 mg | ORAL_TABLET | Freq: Three times a day (TID) | ORAL | 3 refills | Status: DC | PRN

## 2023-08-20 MED ORDER — DICLOFENAC POTASSIUM 50 MG PO TABS: ORAL_TABLET | ORAL | 6 refills | Status: DC

## 2023-08-20 NOTE — Progress Notes (Signed)
   CC:  headaches PRIMARY NEUROLOGIST: Penumalli  Follow-up Visit  Last visit: 08/09/22   Today August 20, 2023 SS: Has been out of propranolol 80 mg daily for months, not much benefit.  3-4 migraines weekly. Throbbing, sharp pain frontal. Migraine features, gets very nauseated. Takes meclizine as needed for dizziness, may take up to twice a day. Takes phenergan for migraine.  Diclofenac was helpful.  Brief HPI: 52 year old female with a history of pineal cyst, kidney stones, depression, anxiety, ADHD who follows in clinic for migraines. 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  At her last visit, propranolol was increased to 60 mg daily. Diclofenac was started for rescue.  Interval History: 08/09/22 Rebecca Andrade: Headache frequency has improved slightly, but she continues to have 2 migraines per week while on propranolol. No side effect with it. Diclofenac helps take the edge off of her migraines but does not resolve them. Phenergan helps with nausea.  Headache days per month: 8 Headache free days per month: 22  Current Headache Regimen: Preventative: propranolol 60 mg daily Abortive: diclofenac 50-100 mg PRN, phenergan 25 mg PRN  Prior Therapies                                  Effexor 150 mg daily Fluoxetine 40 mg daily Propranolol 60 mg daily Nurtec every other day Topamax contraindicated due to kidney stones Toradol Diclofenac Robaxin  Qulipta (denied)  Physical Exam:   Vital Signs: BP 119/76   Pulse 81   Ht 5\' 7"  (1.702 m)   Wt 189 lb (85.7 kg)   LMP 09/02/2018   BMI 29.60 kg/m  GENERAL:  well appearing, in no acute distress, alert  SKIN:  Color, texture, turgor normal. No rashes or lesions HEAD:  Normocephalic/atraumatic. RESP: normal respiratory effort MSK:  No gross joint deformities.   NEUROLOGICAL: Mental Status: Alert, oriented to person, place and time, Follows commands, and Speech fluent and appropriate. Cranial Nerves: PERRL, face symmetric,  no dysarthria, hearing grossly intact Motor: moves all extremities equally Gait: normal-based.  IMPRESSION: 52 year old female with a history of pineal cyst, kidney stones, depression, anxiety, ADHD who presents for follow up of migraines.  PLAN: -Start Vyepti 100 mg every 3 months for migraine prevention -Not willing to try injectable CGRP or Botox due to needle phobia -Rescue: Continue diclofenac 50-100 mg PRN, phenergan 25 mg PRN for nausea, meclizine 25 mg as needed for dizziness -Follow-up: 6 months  [name], DNP  [facility] [address] [place], [place] [address] [phone]

## 2023-08-20 NOTE — Patient Instructions (Addendum)
Start Vyepti for migraine prevention, an infusion every 3 months Use Diclofenac for acute migraine treatment, may combine with phenergan for severe migraines. Limit treating headache to no more than 2-3 days a week to prevent rebound headache.

## 2023-08-21 ENCOUNTER — Telehealth: Payer: Self-pay

## 2023-08-21 NOTE — Telephone Encounter (Signed)
-----   Message from Glean Salvo sent at 08/20/2023  4:56 PM EDT ----- Can we fill out a start form for Vyepti 100 mg every 3 months? Thanks

## 2023-08-23 ENCOUNTER — Ambulatory Visit: Payer: Medicare HMO | Admitting: Neurology

## 2023-08-28 ENCOUNTER — Ambulatory Visit (HOSPITAL_COMMUNITY): Payer: Medicare HMO | Admitting: Licensed Clinical Social Worker

## 2023-08-29 ENCOUNTER — Ambulatory Visit: Payer: Self-pay

## 2023-08-29 NOTE — Patient Outreach (Signed)
Care Coordination   Follow Up Visit Note   08/29/2023 Name: Rebecca Andrade MRN: [id number] DOB: 01/21/1971  Rebecca Andrade is a 52 y.o. year old female who sees Tysinger, Kermit Balo, [place] for primary care. I spoke with  Theresa S Stocking by phone today.  What matters to the patients health and wellness today?  SW contacted the patient to follow up on goal progression related to food insecurity. SW determined the patient has accessed food resources provided by SW. The patient does not identify any other needs at this time.    Goals Addressed             This Visit's Progress    COMPLETED: Care coordination activities       Care Coordination Interventions: Confirmed receipt of mailed resource Discussed the patient has access some of the food resources provided and they have been very helpful Encouraged the patient to contact SW as needed         SDOH assessments and interventions completed:  Yes  SDOH Interventions Today    Flowsheet Row Most Recent Value  SDOH Interventions   Food Insecurity Interventions Other (Comment)  [Patient is now accessing food pantry resources,  needs have been met]        Care Coordination Interventions:  Yes, provided   Interventions Today    Flowsheet Row Most Recent Value  Chronic Disease   Chronic disease during today's visit Other  [Food insecurity]  General Interventions   General Interventions Discussed/Reviewed General Interventions Reviewed, [employer] has accessed food resources and her needs have been met at this time]        Follow up plan: No further intervention required.   Encounter Outcome:  Patient Visit Completed   Bevelyn Ngo, BSW, CDP [facility]  [facility], [facility] Social Worker Direct Dial: [phone]  Fax: [phone]

## 2023-08-29 NOTE — Patient Instructions (Signed)
Visit Information  Thank you for taking time to visit with me today. Please don't hesitate to contact me if I can be of assistance to you.   Following are the goals we discussed today:   Goals Addressed             This Visit's Progress    COMPLETED: Care coordination activities       Care Coordination Interventions: Confirmed receipt of mailed resource Discussed the patient has access some of the food resources provided and they have been very helpful Encouraged the patient to contact SW as needed         If you are experiencing a Mental Health or Behavioral Health Crisis or need someone to talk to, please call 1-800-273-TALK (toll free, 24 hour hotline) go to [facility] [address] [place] [phone]) call 911  The patient verbalized understanding of instructions, educational materials, and care plan provided today and DECLINED offer to receive copy of patient instructions, educational materials, and care plan.   No further follow up required: Please contact your primary care provider as needed.  Bevelyn Ngo, BSW, CDP [facility] [place] [facility]  [facility], [facility] Social Worker Direct Dial: [phone]  Fax: [phone]

## 2023-08-30 NOTE — Telephone Encounter (Signed)
Pt was Approved for Vyepti on 08/30/2023

## 2023-09-06 ENCOUNTER — Telehealth: Payer: Self-pay

## 2023-09-06 ENCOUNTER — Ambulatory Visit
Admission: RE | Admit: 2023-09-06 | Discharge: 2023-09-06 | Disposition: A | Discharge status: Home / Self Care | Payer: Medicare HMO | Source: Ambulatory Visit | Attending: Medical | Admitting: Medical

## 2023-09-06 DIAGNOSIS — Z1231 Encounter for screening mammogram for malignant neoplasm of breast: Secondary | ICD-10-CM

## 2023-09-06 NOTE — Telephone Encounter (Signed)
Notified by [facility] that patient declines vyepti due to cost. $385.29

## 2023-09-10 NOTE — Progress Notes (Signed)
I am happy to report that her mammogram was normal, no worrisome findings.

## 2023-09-11 ENCOUNTER — Ambulatory Visit (HOSPITAL_COMMUNITY): Payer: Medicare HMO | Admitting: Licensed Clinical Social Worker

## 2023-09-14 ENCOUNTER — Telehealth (HOSPITAL_COMMUNITY): Payer: Self-pay

## 2023-09-14 DIAGNOSIS — F900 Attention-deficit hyperactivity disorder, predominantly inattentive type: Secondary | ICD-10-CM

## 2023-09-14 MED ORDER — AMPHETAMINE-DEXTROAMPHETAMINE 10 MG PO TABS
ORAL_TABLET | ORAL | 0 refills | Status: DC
Start: 2023-09-14 — End: 2023-10-15

## 2023-09-14 NOTE — Telephone Encounter (Signed)
Prescription sent.  Patient can pick up the medicine on her due date.

## 2023-09-14 NOTE — Telephone Encounter (Signed)
Patient is calling for a refill on her Adderall, last filled on 10/8. Patients last appointment was 9/9 and she has a follow up on 12/9. Please review and advise, thank you

## 2023-09-18 ENCOUNTER — Ambulatory Visit (HOSPITAL_COMMUNITY): Payer: Medicare HMO | Admitting: Licensed Clinical Social Worker

## 2023-09-26 ENCOUNTER — Other Ambulatory Visit: Payer: Self-pay | Admitting: Diagnostic Neuroimaging

## 2023-09-28 ENCOUNTER — Ambulatory Visit (INDEPENDENT_AMBULATORY_CARE_PROVIDER_SITE_OTHER): Payer: Medicare HMO | Admitting: Medical

## 2023-09-28 VITALS — BP 112/68 | HR 77 | Temp 97.7°F | Wt 188.2 lb

## 2023-09-28 DIAGNOSIS — M79671 Pain in right foot: Secondary | ICD-10-CM

## 2023-09-28 MED ORDER — LINACLOTIDE 290 MCG PO CAPS: ORAL_CAPSULE | ORAL | 3 refills | Status: DC

## 2023-09-28 MED ORDER — IBUPROFEN 600 MG PO TABS: 600.0000 mg | ORAL_TABLET | Freq: Three times a day (TID) | ORAL | 0 refills | Status: DC | PRN

## 2023-09-28 NOTE — Progress Notes (Signed)
Subjective:  Rebecca Andrade is a 52 y.o. female who presents for Chief Complaint  Patient presents with   Foot Pain    Heel pain on right foot. Been going on a couple weeks.  Needs refills on medications too     Having right heel pain x 2 weeks.  No injury, trauma or fall.   No swelling, no bruising.  Has used some ice.  Hurts to walk barefooted.  Walking on tip toes of late.  No numbness, no tingling.  Wears flats and tennis shoes, each bout 50% of the time.  Left foot is fine.  No other aggravating or relieving factors.    No other c/o.  The following portions of the patient's history were reviewed and updated as appropriate: allergies, current medications, past family history, past medical history, past social history, past surgical history and problem list.  ROS Otherwise as in subjective above    Objective: BP 112/68   Pulse 77   Temp 97.7 F (36.5 C)   Wt 188 lb 3.2 oz (85.4 kg)   LMP 09/02/2018   BMI 29.48 kg/m   General appearance: alert, no distress, well developed, well nourished MSK: tender over right volar heel throughout, no swelling, no deformity, guarded avoiding putting pressure on heel.  Arch somewhat decreased. Otherwise foot and toes nontender with normal ROM Foot neurovascularly intact    Assessment: Encounter Diagnosis  Name Primary?   Intractable right heel pain Yes     Plan: Discussed exam findings, symptoms, and etiology likely heel spur or plantar fascitis.  Discussed diagnosis, treatment recommendations, and follow up.     Go for xray  Assuming Plantar fascitis and not significant heel spur,  Every morning try doing a tennis ball massage with feet on the floor and towel stretch behind the ball of the foot to stretch the plantar fascia Order plantar fascitis night time 90 degree splints online, through [employer] for example.   They are typically about $25 each Avoid going barefoot since your feet flatten out without good arch support I  recommend you use arch support shoe inserts.   This will help support the plantar fascia and arches Begin short term ibuprofen as below  Carlea was seen today for foot pain.  Diagnoses and all orders for this visit:  Intractable right heel pain -     DG Os Calcis Right; Future  Other orders -     linaclotide (LINZESS) 290 MCG CAPS capsule; TAKE 1 CAPSULE BY MOUTH EVERY DAY BEFORE BREAKFAST -     ibuprofen (ADVIL) 600 MG tablet; Take 1 tablet (600 mg total) by mouth every 8 (eight) hours as needed.    Follow up: 2-3 weeks

## 2023-09-28 NOTE — Patient Instructions (Signed)
Recommendations:  Plantar fascitis  Every morning try doing a tennis ball massage with feet on the floor and towel stretch behind the ball of the foot to stretch the plantar fascia  Order plantar fascitis night time 90 degree splints online, through [employer] for example.   They are typically about $25 each  Avoid going barefoot since your feet flatten out without good arch support  I recommend you use arch support shoe INSERTS.   This will help support the plantar fascia and arches  You can use over the counter pain reliever for worse pain    Plantar Fasciitis Plantar fasciitis is a painful foot condition that affects the heel. It occurs when the band of tissue that connects the toes to the heel bone (plantar fascia) becomes irritated. This can happen after exercising too much or doing other repetitive activities (overuse injury). The pain from plantar fasciitis can range from mild irritation to severe pain that makes it difficult for you to walk or move. The pain is usually worse in the morning or after you have been sitting or lying down for a while. What are the causes? This condition may be caused by:  Standing for long periods of time.  Wearing shoes that do not fit.  Doing high-impact activities, including running, aerobics, and ballet.  Being overweight.  Having an abnormal way of walking (gait).  Having tight calf muscles.  Having high arches in your feet.  Starting a new athletic activity.  What are the signs or symptoms? The main symptom of this condition is heel pain. Other symptoms include:  Pain that gets worse after activity or exercise.  Pain that is worse in the morning or after resting.  Pain that goes away after you walk for a few minutes.  How is this diagnosed? This condition may be diagnosed based on your signs and symptoms. Your health care provider will also do a physical exam to check for:  A tender area on the bottom of your foot.  A high  arch in your foot.  Pain when you move your foot.  Difficulty moving your foot.  You may also need to have imaging studies to confirm the diagnosis. These can include:  X-rays.  Ultrasound.  MRI.  How is this treated? Treatment for plantar fasciitis depends on the severity of the condition. Your treatment may include:  Rest, ice, and over-the-counter pain medicines to manage your pain.  Exercises to stretch your calves and your plantar fascia.  A splint that holds your foot in a stretched, upward position while you sleep (night splint).  Physical therapy to relieve symptoms and prevent problems in the future.  Cortisone injections to relieve severe pain.  Extracorporeal shock wave therapy (ESWT) to stimulate damaged plantar fascia with electrical impulses. It is often used as a last resort before surgery.  Surgery, if other treatments have not worked after 12 months.  Follow these instructions at home:  Take medicines only as directed by your health care provider.  Avoid activities that cause pain.  Roll the bottom of your foot over a bag of ice or a bottle of cold water. Do this for 20 minutes, 3-4 times a day.  Perform simple stretches as directed by your health care provider.  Try wearing athletic shoes with air-sole or gel-sole cushions or soft shoe inserts.  Wear a night splint while sleeping, if directed by your health care provider.  Keep all follow-up appointments with your health care provider. How is this prevented?  Do not perform exercises or activities that cause heel pain.  Consider finding low-impact activities if you continue to have problems.  Lose weight if you need to. The best way to prevent plantar fasciitis is to avoid the activities that aggravate your plantar fascia. Contact a health care provider if:  Your symptoms do not go away after treatment with home care measures.  Your pain gets worse.  Your pain affects your ability to move  or do your daily activities. This information is not intended to replace advice given to you by your health care provider. Make sure you discuss any questions you have with your health care provider. Document Released: 07/18/2001 Document Revised: 03/27/2016 Document Reviewed: 09/02/2014 Elsevier Interactive Patient Education  [employer].

## 2023-09-29 ENCOUNTER — Other Ambulatory Visit (HOSPITAL_COMMUNITY): Payer: Self-pay | Admitting: Psychiatry

## 2023-09-29 DIAGNOSIS — F4010 Social phobia, unspecified: Secondary | ICD-10-CM

## 2023-09-29 DIAGNOSIS — F332 Major depressive disorder, recurrent severe without psychotic features: Secondary | ICD-10-CM

## 2023-09-30 ENCOUNTER — Other Ambulatory Visit (HOSPITAL_COMMUNITY): Payer: Self-pay | Admitting: Psychiatry

## 2023-09-30 ENCOUNTER — Other Ambulatory Visit: Payer: Self-pay | Admitting: Medical

## 2023-09-30 DIAGNOSIS — F332 Major depressive disorder, recurrent severe without psychotic features: Secondary | ICD-10-CM

## 2023-09-30 DIAGNOSIS — F4010 Social phobia, unspecified: Secondary | ICD-10-CM

## 2023-10-01 ENCOUNTER — Ambulatory Visit
Admission: RE | Admit: 2023-10-01 | Discharge: 2023-10-01 | Disposition: A | Discharge status: Home / Self Care | Payer: Medicare HMO | Source: Ambulatory Visit | Attending: Medical | Admitting: Medical

## 2023-10-01 DIAGNOSIS — M7731 Calcaneal spur, right foot: Secondary | ICD-10-CM | POA: Diagnosis not present

## 2023-10-01 DIAGNOSIS — M79671 Pain in right foot: Secondary | ICD-10-CM

## 2023-10-01 NOTE — Telephone Encounter (Signed)
Pt was switched to [employer]

## 2023-10-01 NOTE — Progress Notes (Signed)
There is a fairly sizable bone spur.  This could be causing the symptoms as well as plantar fascia pain.  Use the recommendations we discussed again today including tennis ball stretch in the morning, towel stretches in the morning, avoiding going barefoot or wearing flip-flops, can consider that heel cups.  Follow-up in a few weeks

## 2023-10-03 ENCOUNTER — Telehealth: Payer: Self-pay | Admitting: Family Medicine

## 2023-10-03 ENCOUNTER — Other Ambulatory Visit: Payer: Self-pay | Admitting: Medical

## 2023-10-03 DIAGNOSIS — M79673 Pain in unspecified foot: Secondary | ICD-10-CM

## 2023-10-03 MED ORDER — TRAMADOL HCL 50 MG PO TABS: 50.0000 mg | ORAL_TABLET | Freq: Two times a day (BID) | ORAL | 0 refills | Status: DC

## 2023-10-03 NOTE — Telephone Encounter (Signed)
Left message for pt to call me back 

## 2023-10-03 NOTE — Telephone Encounter (Signed)
Pt called she is in a lot of pain and advil and stretches are making it worse.  She can't walk.  What are her next steps, will she be having surgery?  She wants pain meds.

## 2023-10-03 NOTE — Telephone Encounter (Signed)
Pt called back and I advised her of Shane's message.

## 2023-10-11 ENCOUNTER — Telehealth: Payer: Self-pay | Admitting: Medical

## 2023-10-11 ENCOUNTER — Other Ambulatory Visit: Payer: Self-pay | Admitting: Medical

## 2023-10-11 MED ORDER — TRAMADOL HCL 50 MG PO TABS: 50.0000 mg | ORAL_TABLET | Freq: Two times a day (BID) | ORAL | 0 refills | Status: DC

## 2023-10-11 NOTE — Telephone Encounter (Signed)
Mialani is requesting a refill on tramadol to  [id number] - [place], [place] - [address]  She says she is still in pain and her appointment for her knee isn't until 10/17/23

## 2023-10-15 ENCOUNTER — Encounter (HOSPITAL_COMMUNITY): Payer: Self-pay | Admitting: Psychiatry

## 2023-10-15 ENCOUNTER — Telehealth (HOSPITAL_BASED_OUTPATIENT_CLINIC_OR_DEPARTMENT_OTHER): Payer: Medicare HMO | Admitting: Psychiatry

## 2023-10-15 VITALS — Wt 188.0 lb

## 2023-10-15 DIAGNOSIS — F332 Major depressive disorder, recurrent severe without psychotic features: Secondary | ICD-10-CM | POA: Diagnosis not present

## 2023-10-15 DIAGNOSIS — F900 Attention-deficit hyperactivity disorder, predominantly inattentive type: Secondary | ICD-10-CM

## 2023-10-15 DIAGNOSIS — F4010 Social phobia, unspecified: Secondary | ICD-10-CM | POA: Diagnosis not present

## 2023-10-15 MED ORDER — QUETIAPINE FUMARATE 100 MG PO TABS: 100.0000 mg | ORAL_TABLET | Freq: Every day | ORAL | 2 refills | Status: DC

## 2023-10-15 MED ORDER — AMPHETAMINE-DEXTROAMPHETAMINE 10 MG PO TABS: ORAL_TABLET | ORAL | 0 refills | Status: DC

## 2023-10-15 MED ORDER — FLUOXETINE HCL 40 MG PO CAPS: 40.0000 mg | ORAL_CAPSULE | Freq: Every day | ORAL | 2 refills | Status: DC

## 2023-10-15 MED ORDER — VENLAFAXINE HCL ER 150 MG PO CP24
150.0000 mg | ORAL_CAPSULE | Freq: Every day | ORAL | 2 refills | Status: DC
Start: 2023-10-15 — End: 2023-12-10

## 2023-10-15 NOTE — Progress Notes (Signed)
[facility] MD Virtual Progress Note   Patient Location: Home Provider Location: Home  I connect with patient by telephone and verified that I am speaking with correct person by using two identifiers. I discussed the limitations of evaluation and management by telemedicine and the availability of in person appointments. I also discussed with the patient that there may be a patient responsible charge related to this service. The patient expressed understanding and agreed to proceed.  Rebecca Andrade [id number] 52 y.o.  10/15/2023 9:30 AM  History of Present Illness:  Patient is evaluated by phone session.  Had a phone camera did not work and she cannot do video session.  She reported continued to feel sad, anxious when she think about her brother who died due to brain cancer.  She is supposed to see therapist but she had to reschedule and her appointment coming up tomorrow morning.  Patient told her mother had a fall and need knee surgery.  Patient also told that her car is also giving a lot of issues.  She admitted condition remain unchanged as she does not leave the house because of anxiety and nervousness.  She has some crying spells but denies any active or passive suicidal thoughts or homicidal thoughts.  She sleeps okay.  She struggled with attention, focus but manageable.  She is on Adderall.  She has no tremor or shakes or any EPS.  She has a lot of social anxiety and does not leave the house unless she has a doctor's appointment.  Her mother and her son lives close by.  Patient feels really good as son is doing very well.  She has no tremor or shakes or any EPS.  Recently she had blood work and her labs are stable.  Her hemoglobin A1c 5.4.  Recently she had a visit with PCP.  She reported occasional dizziness but not every day.  She takes meclizine for vertigo that helps.  Past Psychiatric History: H/O taking antidepressants since 1990.  H/O inpatient twice, one in 2005 and in  2014. H/O cocaine use, paranoia, hallucination, mood swing, anger and mania. No h/o suicidal attempt. Tried Zoloft, Paxil, Lexapro, Wellbutrin, lithium, Remeron, Topamax, Abilify, Pristiq, Effexor, amitriptyline, Xanax, temazepam, Valium and Cymbalta.  PCP prscribed Adderall for ADD.      Outpatient Encounter Medications as of 10/15/2023  Medication Sig   amphetamine-dextroamphetamine (ADDERALL) 10 MG tablet Take 2 in am and 2 at noon   atorvastatin (LIPITOR) 20 MG tablet Take 1 tablet (20 mg total) by mouth daily.   busPIRone (BUSPAR) 30 MG tablet Take 1 tablet (30 mg total) by mouth daily.   Cholecalciferol (VITAMIN D3) 50 MCG (2000 UT) capsule Take 1 capsule (2,000 Units total) by mouth daily.   diclofenac (CATAFLAM) 50 MG tablet 1-2 tablets as needed for migraine headache   Fezolinetant (VEOZAH) 45 MG TABS Take 1 tablet (45 mg total) by mouth daily.   FLUoxetine (PROZAC) 40 MG capsule Take 1 capsule (40 mg total) by mouth daily.   ibuprofen (ADVIL) 600 MG tablet Take 1 tablet (600 mg total) by mouth every 8 (eight) hours as needed.   linaclotide (LINZESS) 290 MCG CAPS capsule TAKE 1 CAPSULE BY MOUTH EVERY DAY BEFORE BREAKFAST   meclizine (ANTIVERT) 25 MG tablet TAKE 1 TABLET BY MOUTH TWICE A DAY AS NEEDED FOR DIZZINESS   oxybutynin (DITROPAN) 5 MG tablet Take 5 mg by mouth daily.   promethazine (PHENERGAN) 25 MG tablet Take 1 tablet (25 mg total) by  mouth every 8 (eight) hours as needed for nausea or vomiting.   QUEtiapine (SEROQUEL) 100 MG tablet Take 1 tablet (100 mg total) by mouth at bedtime.   traMADol (ULTRAM) 50 MG tablet Take 1 tablet (50 mg total) by mouth 2 (two) times daily.   venlafaxine XR (EFFEXOR-XR) 150 MG 24 hr capsule Take 1 capsule (150 mg total) by mouth daily with breakfast.   No facility-administered encounter medications on file as of 10/15/2023.    Recent Results (from the past 2160 hour(s))  Comprehensive metabolic panel     Status: Abnormal   Collection Time:  08/08/23 11:22 AM  Result Value Ref Range   Glucose 93 70 - 99 mg/dL   BUN 11 6 - 24 mg/dL   Creatinine, Ser 1.61 0.57 - 1.00 mg/dL   eGFR 86 >09 UE/AVW/0.98   BUN/Creatinine Ratio 13 9 - 23   Sodium 144 134 - 144 mmol/L   Potassium 3.9 3.5 - 5.2 mmol/L   Chloride 108 (H) 96 - 106 mmol/L   CO2 21 20 - 29 mmol/L   Calcium 9.1 8.7 - 10.2 mg/dL   Total Protein 5.9 (L) 6.0 - 8.5 g/dL   Albumin 3.8 3.8 - 4.9 g/dL   Globulin, Total 2.1 1.5 - 4.5 g/dL   Bilirubin Total 0.3 0.0 - 1.2 mg/dL   Alkaline Phosphatase 81 44 - 121 IU/L   AST 16 0 - 40 IU/L   ALT 12 0 - 32 IU/L  CBC     Status: None   Collection Time: 08/08/23 11:22 AM  Result Value Ref Range   WBC 7.8 3.4 - 10.8 x10E3/uL   RBC 4.68 3.77 - 5.28 x10E6/uL   Hemoglobin 14.7 11.1 - 15.9 g/dL   Hematocrit 11.9 14.7 - 46.6 %   MCV 93 79 - 97 fL   MCH 31.4 26.6 - 33.0 pg   MCHC 33.8 31.5 - 35.7 g/dL   RDW 82.9 56.2 - 13.0 %   Platelets 283 150 - 450 x10E3/uL  Lipid panel     Status: None   Collection Time: 08/08/23 11:22 AM  Result Value Ref Range   Cholesterol, Total 150 100 - 199 mg/dL   Triglycerides 77 0 - 149 mg/dL   HDL 63 >86 mg/dL   VLDL Cholesterol Cal 15 5 - 40 mg/dL   LDL Chol Calc (NIH) 72 0 - 99 mg/dL   Chol/HDL Ratio 2.4 0.0 - 4.4 ratio    Comment:                                   T. Chol/HDL Ratio                                             Men  Women                               1/2 Avg.Risk  3.4    3.3                                   Avg.Risk  5.0    4.4  2X Avg.Risk  9.6    7.1                                3X Avg.Risk 23.4   11.0   VITAMIN D 25 Hydroxy (Vit-D Deficiency, Fractures)     Status: None   Collection Time: 08/08/23 11:22 AM  Result Value Ref Range   Vit D, 25-Hydroxy 30.1 30.0 - 100.0 ng/mL    Comment: Vitamin D deficiency has been defined by the Institute of Medicine and an Endocrine Society practice guideline as a level of serum 25-OH vitamin D less than  20 ng/mL (1,2). The Endocrine Society went on to further define vitamin D insufficiency as a level between 21 and 29 ng/mL (2). 1. IOM (Institute of Medicine). 2010. Dietary reference    intakes for calcium and D. [place]: The    [employer]. 2. Holick MF, Binkley Morton, Bischoff-Ferrari HA, et al.    Evaluation, treatment, and prevention of vitamin D    deficiency: an Endocrine Society clinical practice    guideline. JCEM. 2011 Jul; 96(7):1911-30.   Hemoglobin A1c     Status: None   Collection Time: 08/08/23 11:22 AM  Result Value Ref Range   Hgb A1c MFr Bld 5.2 4.8 - 5.6 %    Comment:          Prediabetes: 5.7 - 6.4          Diabetes: >6.4          Glycemic control for adults with diabetes: <7.0    Est. average glucose Bld gHb Est-mCnc 103 mg/dL     Psychiatric Specialty Exam: Physical Exam  Review of Systems  Neurological:        Sometimes dizziness  Psychiatric/Behavioral:  Positive for dysphoric mood.     Weight 188 lb (85.3 kg), last menstrual period 09/02/2018.There is no height or weight on file to calculate BMI.  General Appearance: NA  Eye Contact:  NA  Speech:  Slow  Volume:  Decreased  Mood:  Anxious and Dysphoric  Affect:  NA  Thought Process:  Descriptions of Associations: Intact  Orientation:  Full (Time, Place, and Person)  Thought Content:  Rumination  Suicidal Thoughts:  No  Homicidal Thoughts:  No  Memory:  Immediate;   Fair Recent;   Fair Remote;   Fair  Judgement:  Intact  Insight:  Present  Psychomotor Activity:  NA  Concentration:  Concentration: Fair and Attention Span: Fair  Recall:  Fair  Fund of Knowledge:  Good  Language:  Good  Akathisia:  No  Handed:  Right  AIMS (if indicated):     Assets:  Communication Skills Desire for Improvement Housing Social Support Transportation  ADL's:  Intact  Cognition:  WNL  Sleep:  ok     Assessment/Plan: Major depressive disorder, recurrent, severe w/o psychotic behavior  (HCC) - Plan: QUEtiapine (SEROQUEL) 100 MG tablet, venlafaxine XR (EFFEXOR-XR) 150 MG 24 hr capsule  Social anxiety disorder - Plan: FLUoxetine (PROZAC) 40 MG capsule, venlafaxine XR (EFFEXOR-XR) 150 MG 24 hr capsule  Attention deficit hyperactivity disorder (ADHD), predominantly inattentive type - Plan: amphetamine-dextroamphetamine (ADDERALL) 10 MG tablet  Reviewed blood work results.  Discussed polypharmacy and dizziness.  I recommend should cut down the Seroquel but patient does not want to change the dose as she feel it is helping her sleep.  She is on moderate dose of BuSpar, Effexor, Prozac, Seroquel and  Adderall.  We have tried reducing the medication in the past but patient is very reluctant reluctant and like to keep the current medication.  Her hemoglobin A1c 5.4.  I encouraged should see therapist tomorrow morning with Shanda Bumps.  Discussed psychosocial stressors especially sad about brother who died due to brain cancer.  I encourage walking every day when she does with the dog.  She has a lot of social anxiety and does not like around people.  Reassurance given.  Patient is hoping therapy will help her.  Continue Prozac 40 mg daily, venlafaxine 150 mg daily, Seroquel 100 mg at bedtime and Adderall 10 mg 2 in the morning and 2 in the afternoon.  Discussed concern about the polypharmacy.  Patient does not feel dizziness is caused by Seroquel and she believe it is vertigo and taking meclizine.  We will continue to emphasize to reduce medication in upcoming appointments.  Encouraged to keep her therapist appointment.  Follow up in 3 months.   Follow Up Instructions:     I discussed the assessment and treatment plan with the patient. The patient was provided an opportunity to ask questions and all were answered. The patient agreed with the plan and demonstrated an understanding of the instructions.   The patient was advised to call back or seek an in-person evaluation if the symptoms worsen or if  the condition fails to improve as anticipated.    Collaboration of Care: Other provider involved in patient's care AEB notes are available in epic to review  Patient/Guardian was advised Release of Information must be obtained prior to any record release in order to collaborate their care with an outside provider. Patient/Guardian was advised if they have not already done so to contact the registration department to sign all necessary forms in order for [place] to release information regarding their care.   Consent: Patient/Guardian gives verbal consent for treatment and assignment of benefits for services provided during this visit. Patient/Guardian expressed understanding and agreed to proceed.     I provided 28 minutes of non face to face time during this encounter.  Note: This document was prepared by [employer] voice dictation technology and any errors that results from this process are unintentional.    Cleotis Nipper, MD 10/15/2023

## 2023-10-16 ENCOUNTER — Ambulatory Visit (INDEPENDENT_AMBULATORY_CARE_PROVIDER_SITE_OTHER): Payer: Medicare HMO | Admitting: Licensed Clinical Social Worker

## 2023-10-16 ENCOUNTER — Encounter (HOSPITAL_COMMUNITY): Payer: Self-pay | Admitting: Licensed Clinical Social Worker

## 2023-10-16 DIAGNOSIS — F332 Major depressive disorder, recurrent severe without psychotic features: Secondary | ICD-10-CM | POA: Diagnosis not present

## 2023-10-16 DIAGNOSIS — F4310 Post-traumatic stress disorder, unspecified: Secondary | ICD-10-CM
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
## 2023-10-17 ENCOUNTER — Ambulatory Visit (INDEPENDENT_AMBULATORY_CARE_PROVIDER_SITE_OTHER): Payer: Medicare HMO

## 2023-10-17 ENCOUNTER — Encounter: Payer: Self-pay | Admitting: Podiatry

## 2023-10-17 ENCOUNTER — Ambulatory Visit: Payer: Medicare HMO | Admitting: Podiatry

## 2023-10-17 DIAGNOSIS — M778 Other enthesopathies, not elsewhere classified: Secondary | ICD-10-CM

## 2023-10-17 MED ORDER — TRIAMCINOLONE ACETONIDE 10 MG/ML IJ SUSP
10.0000 mg | Freq: Once | INTRAMUSCULAR | Status: AC
  Administered 2023-10-17: 10 mg via INTRA_ARTICULAR

## 2023-10-17 NOTE — Progress Notes (Signed)
Subjective:   Patient ID: Rebecca Andrade, female   DOB: 52 y.o.   MRN: [id number]   HPI Patient presents stating she has had a lot of pain in her right heel and it has been very sore and making it hard for her to walk or wear shoe gear and does not remember injury.  She is needle phobic but knows she needs treatment and does not smoke likes to be active   Review of Systems  All other systems reviewed and are negative.       Objective:  Physical Exam Vitals and nursing note reviewed.  Constitutional:      Appearance: She is well-developed.  Pulmonary:     Effort: Pulmonary effort is normal.  Musculoskeletal:        General: Normal range of motion.  Skin:    General: Skin is warm.  Neurological:     Mental Status: She is alert.     Neurovascular status intact muscle strength adequate range of motion adequate exquisite discomfort medial fascial band right at the insertional point tendon calcaneus fluid buildup moderate depression of the arch good digital perfusion well-oriented acute plantar     Assessment:  Fasciitis right with inflammation fluid around the medial band     Plan:  H&P reviewed and I have recommended injection therapy due to the fluid buildup spent a great deal of time going over this with her and try to make her aware of the importance and she let me do this and did very well with it.  I then applied fascial brace properly fitted into her arch and instructed her on shoe gear usage anti-inflammatories as needed and physical therapy was sheets given to patient  X-rays indicate small spur no indication stress fracture arthritis

## 2023-10-17 NOTE — Patient Instructions (Signed)

## 2023-11-05 ENCOUNTER — Ambulatory Visit: Payer: Medicare HMO | Admitting: Podiatry

## 2023-11-13 ENCOUNTER — Other Ambulatory Visit: Payer: Medicare HMO

## 2023-11-13 DIAGNOSIS — E559 Vitamin D deficiency, unspecified: Secondary | ICD-10-CM | POA: Diagnosis not present

## 2023-11-13 DIAGNOSIS — H524 Presbyopia: Secondary | ICD-10-CM | POA: Diagnosis not present

## 2023-11-13 DIAGNOSIS — Z79899 Other long term (current) drug therapy: Secondary | ICD-10-CM

## 2023-11-13 DIAGNOSIS — H5203 Hypermetropia, bilateral: Secondary | ICD-10-CM | POA: Diagnosis not present

## 2023-11-13 DIAGNOSIS — H52223 Regular astigmatism, bilateral: Secondary | ICD-10-CM | POA: Diagnosis not present

## 2023-11-13 DIAGNOSIS — H2513 Age-related nuclear cataract, bilateral: Secondary | ICD-10-CM | POA: Diagnosis not present

## 2023-11-14 ENCOUNTER — Other Ambulatory Visit: Payer: Self-pay | Admitting: Medical

## 2023-11-14 ENCOUNTER — Other Ambulatory Visit: Payer: Self-pay | Admitting: Internal Medicine

## 2023-11-14 LAB — COMPREHENSIVE METABOLIC PANEL
ALT: 10 [IU]/L (ref 0–32)
AST: 12 [IU]/L (ref 0–40)
Albumin: 3.9 g/dL (ref 3.8–4.9)
Alkaline Phosphatase: 86 [IU]/L (ref 44–121)
BUN/Creatinine Ratio: 16 (ref 9–23)
BUN: 12 mg/dL (ref 6–24)
Bilirubin Total: 0.5 mg/dL (ref 0.0–1.2)
CO2: 23 mmol/L (ref 20–29)
Calcium: 9.1 mg/dL (ref 8.7–10.2)
Chloride: 107 mmol/L — ABNORMAL HIGH (ref 96–106)
Creatinine, Ser: 0.77 mg/dL (ref 0.57–1.00)
Globulin, Total: 1.8 g/dL (ref 1.5–4.5)
Glucose: 93 mg/dL (ref 70–99)
Potassium: 4.1 mmol/L (ref 3.5–5.2)
Sodium: 144 mmol/L (ref 134–144)
Total Protein: 5.7 g/dL — ABNORMAL LOW (ref 6.0–8.5)
eGFR: 92 mL/min/{1.73_m2} (ref >59)

## 2023-11-14 LAB — VITAMIN D 25 HYDROXY (VIT D DEFICIENCY, FRACTURES): Vit D, 25-Hydroxy: 35.6 ng/mL (ref 30.0–100.0)

## 2023-11-14 MED ORDER — VITAMIN D (ERGOCALCIFEROL) 1.25 MG (50000 UNIT) PO CAPS: 50000.0000 [IU] | ORAL_CAPSULE | ORAL | 1 refills | Status: DC

## 2023-11-14 MED ORDER — VEOZAH 45 MG PO TABS: 1.0000 | ORAL_TABLET | Freq: Every day | ORAL | 1 refills | Status: DC

## 2023-11-14 NOTE — Telephone Encounter (Signed)
 Patient would like a vitamin D rx that she can take once a week and not everyday. She also needs a refill on her Veozah.

## 2023-11-14 NOTE — Progress Notes (Signed)
 Labs look ok.  Continue current treatment/medications.  Scheduled appt Follow up in 3 months for med check and lab surveillance regarding medicaiton

## 2023-11-15 ENCOUNTER — Telehealth (HOSPITAL_COMMUNITY): Payer: Self-pay

## 2023-11-15 DIAGNOSIS — F900 Attention-deficit hyperactivity disorder, predominantly inattentive type: Secondary | ICD-10-CM

## 2023-11-15 MED ORDER — AMPHETAMINE-DEXTROAMPHETAMINE 10 MG PO TABS: ORAL_TABLET | ORAL | 0 refills | Status: DC

## 2023-11-15 NOTE — Telephone Encounter (Signed)
 Patient is calling for a refill on her Adderall, last filled on 10/15/2023. Patient has a follow up on 01/14/2024. Please review and advise, thank you

## 2023-11-15 NOTE — Telephone Encounter (Signed)
 Prescription sent.  She can pick up on her due date

## 2023-11-16 ENCOUNTER — Other Ambulatory Visit (HOSPITAL_COMMUNITY): Payer: Self-pay

## 2023-11-16 ENCOUNTER — Telehealth: Payer: Self-pay

## 2023-11-16 ENCOUNTER — Telehealth: Payer: Self-pay | Admitting: Medical

## 2023-11-16 NOTE — Telephone Encounter (Signed)
 Pt called and states her insurance will not pay for [facility] and she is thinking she needs a PA.

## 2023-11-20 ENCOUNTER — Other Ambulatory Visit (HOSPITAL_COMMUNITY): Payer: Self-pay | Admitting: Psychiatry

## 2023-11-20 ENCOUNTER — Ambulatory Visit (HOSPITAL_COMMUNITY): Payer: Medicare HMO | Admitting: Licensed Clinical Social Worker

## 2023-11-20 DIAGNOSIS — F4010 Social phobia, unspecified: Secondary | ICD-10-CM

## 2023-11-20 DIAGNOSIS — H524 Presbyopia: Secondary | ICD-10-CM | POA: Diagnosis not present

## 2023-11-21 ENCOUNTER — Telehealth: Payer: Self-pay

## 2023-11-21 ENCOUNTER — Other Ambulatory Visit (HOSPITAL_COMMUNITY): Payer: Self-pay

## 2023-11-21 NOTE — Telephone Encounter (Signed)
 Yes, I can check back then on 1/17 to see the cost. As for now the Prior Authorization Renewal for Veozah  has been Approved.  Pharmacy Patient Advocate Encounter   Received notification from Physician's Office that prior authorization for Veozah  is required/requested.   Insurance verification completed.   The patient is insured through [place] .   Per test claim: PA required and submitted KEY/EOC/Request #: Key: [id number] APPROVED  to 12.31.25

## 2023-11-21 NOTE — Telephone Encounter (Signed)
 [id number] I called the patient to let her know what you said about fill too soon & she said she knew that, but she had gotten a text from the pharmacy stating that it wasn't covered.  I looked back and her P.A. for Veozah  last year ended 11/06/23, so can you recheck on 11/22/22 & see if it does go thru her insurance? Thanks

## 2023-11-22 ENCOUNTER — Other Ambulatory Visit: Payer: Self-pay | Admitting: Diagnostic Neuroimaging

## 2023-11-22 NOTE — Telephone Encounter (Signed)
Last seen on 08/20/23 Follow up scheduled on 04/03/24

## 2023-11-23 ENCOUNTER — Other Ambulatory Visit (HOSPITAL_COMMUNITY): Payer: Self-pay

## 2023-11-23 ENCOUNTER — Other Ambulatory Visit (HOSPITAL_COMMUNITY): Payer: Self-pay | Admitting: Psychiatry

## 2023-11-23 DIAGNOSIS — F4010 Social phobia, unspecified: Secondary | ICD-10-CM

## 2023-11-23 DIAGNOSIS — F332 Major depressive disorder, recurrent severe without psychotic features: Secondary | ICD-10-CM

## 2023-11-23 NOTE — Telephone Encounter (Signed)
Update:   Rebecca Andrade has been filled  and picked up  for $12.16 at the patients Pharmacy as of 11/23/23 and is payable again on/after 2.10.25

## 2023-11-24 NOTE — Telephone Encounter (Signed)
Went thru & pt picked up

## 2023-11-29 ENCOUNTER — Encounter: Payer: Self-pay | Admitting: Podiatry

## 2023-11-29 ENCOUNTER — Ambulatory Visit: Payer: Medicare HMO | Admitting: Podiatry

## 2023-11-29 VITALS — Ht 67.0 in | Wt 188.0 lb

## 2023-11-29 DIAGNOSIS — M722 Plantar fascial fibromatosis: Secondary | ICD-10-CM | POA: Diagnosis not present

## 2023-11-29 MED ORDER — TRIAMCINOLONE ACETONIDE 10 MG/ML IJ SUSP
10.0000 mg | Freq: Once | INTRAMUSCULAR | Status: AC
  Administered 2023-11-29: 10 mg via INTRA_ARTICULAR

## 2023-11-29 NOTE — Progress Notes (Signed)
Subjective:   Patient ID: Rebecca Andrade, female   DOB: 53 y.o.   MRN: [id number]   HPI Patient states this pain in the right heel has been severe and she is not able to bear weight on her foot and her heel and states that she needs something to allow her to be ambulatory   ROS      Objective:  Physical Exam  Neuro vascular status intact with exquisite discomfort noted medial fascial band right at the insertional point tendon calcaneus fluid buildup not painful in the actual bone structure itself     Assessment:  Appears to be acute plantar fasciitis right extremely painful     Plan:  HD P reviewed condition and did do today sterile prep and reinjected the plantar fascia 3 mg Kenalog 5 mg Xylocaine.  I then went ahead and I discussed immobilization to try to take all the pressure off the heel and patient wants this and I applied an air fracture walker properly fitted to her lower leg that I want her to wear at all times for the next 3 weeks.  Reappoint 4 to 6 weeks symptoms continue may require surgery ultimately

## 2023-12-04 ENCOUNTER — Ambulatory Visit (INDEPENDENT_AMBULATORY_CARE_PROVIDER_SITE_OTHER): Payer: Medicare HMO | Admitting: Licensed Clinical Social Worker

## 2023-12-04 ENCOUNTER — Inpatient Hospital Stay (HOSPITAL_COMMUNITY)
Admission: AD | Admit: 2023-12-04 | Discharge: 2023-12-10 | DRG: 885 | Disposition: A | Discharge status: Home / Self Care | Payer: Medicare HMO | Source: Intra-hospital | Attending: Psychiatry | Admitting: Psychiatry

## 2023-12-04 ENCOUNTER — Other Ambulatory Visit: Payer: Self-pay

## 2023-12-04 ENCOUNTER — Encounter (HOSPITAL_COMMUNITY): Payer: Self-pay | Admitting: Family

## 2023-12-04 ENCOUNTER — Ambulatory Visit (HOSPITAL_COMMUNITY)
Admission: EM | Admit: 2023-12-04 | Discharge: 2023-12-04 | Disposition: A | Discharge status: Other Acute Inpatient Hospital | Payer: Medicare HMO | Attending: Family | Admitting: Family

## 2023-12-04 ENCOUNTER — Encounter (HOSPITAL_COMMUNITY): Payer: Self-pay | Admitting: Licensed Clinical Social Worker

## 2023-12-04 DIAGNOSIS — Z85841 Personal history of malignant neoplasm of brain: Secondary | ICD-10-CM

## 2023-12-04 DIAGNOSIS — M79671 Pain in right foot: Secondary | ICD-10-CM | POA: Diagnosis present

## 2023-12-04 DIAGNOSIS — Z602 Problems related to living alone: Secondary | ICD-10-CM | POA: Diagnosis present

## 2023-12-04 DIAGNOSIS — N189 Chronic kidney disease, unspecified: Secondary | ICD-10-CM | POA: Diagnosis not present

## 2023-12-04 DIAGNOSIS — F332 Major depressive disorder, recurrent severe without psychotic features: Secondary | ICD-10-CM | POA: Diagnosis not present

## 2023-12-04 DIAGNOSIS — Z808 Family history of malignant neoplasm of other organs or systems: Secondary | ICD-10-CM | POA: Diagnosis not present

## 2023-12-04 DIAGNOSIS — E782 Mixed hyperlipidemia: Secondary | ICD-10-CM

## 2023-12-04 DIAGNOSIS — Z5941 Food insecurity: Secondary | ICD-10-CM

## 2023-12-04 DIAGNOSIS — F4000 Agoraphobia, unspecified: Secondary | ICD-10-CM | POA: Diagnosis present

## 2023-12-04 DIAGNOSIS — M25562 Pain in left knee: Secondary | ICD-10-CM | POA: Diagnosis present

## 2023-12-04 DIAGNOSIS — Z8781 Personal history of (healed) traumatic fracture: Secondary | ICD-10-CM

## 2023-12-04 DIAGNOSIS — Z79899 Other long term (current) drug therapy: Secondary | ICD-10-CM | POA: Diagnosis not present

## 2023-12-04 DIAGNOSIS — K5909 Other constipation: Secondary | ICD-10-CM | POA: Diagnosis not present

## 2023-12-04 DIAGNOSIS — Z818 Family history of other mental and behavioral disorders: Secondary | ICD-10-CM | POA: Diagnosis not present

## 2023-12-04 DIAGNOSIS — F909 Attention-deficit hyperactivity disorder, unspecified type: Secondary | ICD-10-CM | POA: Diagnosis not present

## 2023-12-04 DIAGNOSIS — G43019 Migraine without aura, intractable, without status migrainosus: Secondary | ICD-10-CM | POA: Diagnosis present

## 2023-12-04 DIAGNOSIS — Z91411 Personal history of adult psychological abuse: Secondary | ICD-10-CM

## 2023-12-04 DIAGNOSIS — Z87442 Personal history of urinary calculi: Secondary | ICD-10-CM

## 2023-12-04 DIAGNOSIS — Z560 Unemployment, unspecified: Secondary | ICD-10-CM

## 2023-12-04 DIAGNOSIS — F4010 Social phobia, unspecified: Secondary | ICD-10-CM | POA: Diagnosis present

## 2023-12-04 DIAGNOSIS — F4310 Post-traumatic stress disorder, unspecified: Secondary | ICD-10-CM | POA: Diagnosis present

## 2023-12-04 DIAGNOSIS — R45851 Suicidal ideations: Secondary | ICD-10-CM | POA: Diagnosis not present

## 2023-12-04 DIAGNOSIS — E785 Hyperlipidemia, unspecified: Secondary | ICD-10-CM | POA: Diagnosis not present

## 2023-12-04 DIAGNOSIS — Z9889 Other specified postprocedural states: Secondary | ICD-10-CM

## 2023-12-04 DIAGNOSIS — Z635 Disruption of family by separation and divorce: Secondary | ICD-10-CM

## 2023-12-04 DIAGNOSIS — Z6372 Alcoholism and drug addiction in family: Secondary | ICD-10-CM | POA: Diagnosis not present

## 2023-12-04 DIAGNOSIS — Z811 Family history of alcohol abuse and dependence: Secondary | ICD-10-CM | POA: Diagnosis not present

## 2023-12-04 DIAGNOSIS — F1410 Cocaine abuse, uncomplicated: Secondary | ICD-10-CM | POA: Diagnosis present

## 2023-12-04 DIAGNOSIS — I700 Atherosclerosis of aorta: Secondary | ICD-10-CM | POA: Diagnosis present

## 2023-12-04 DIAGNOSIS — M722 Plantar fascial fibromatosis: Secondary | ICD-10-CM | POA: Diagnosis present

## 2023-12-04 DIAGNOSIS — F988 Other specified behavioral and emotional disorders with onset usually occurring in childhood and adolescence: Secondary | ICD-10-CM | POA: Diagnosis present

## 2023-12-04 DIAGNOSIS — G4700 Insomnia, unspecified: Secondary | ICD-10-CM | POA: Diagnosis not present

## 2023-12-04 DIAGNOSIS — F411 Generalized anxiety disorder: Secondary | ICD-10-CM | POA: Diagnosis present

## 2023-12-04 DIAGNOSIS — Z91410 Personal history of adult physical and sexual abuse: Secondary | ICD-10-CM

## 2023-12-04 DIAGNOSIS — Z6379 Other stressful life events affecting family and household: Secondary | ICD-10-CM

## 2023-12-04 DIAGNOSIS — Z634 Disappearance and death of family member: Secondary | ICD-10-CM

## 2023-12-04 DIAGNOSIS — Z87820 Personal history of traumatic brain injury: Secondary | ICD-10-CM

## 2023-12-04 LAB — URINALYSIS, ROUTINE W REFLEX MICROSCOPIC
Bilirubin Urine: NEGATIVE
Glucose, UA: NEGATIVE mg/dL
Hgb urine dipstick: NEGATIVE
Ketones, ur: NEGATIVE mg/dL
Leukocytes,Ua: NEGATIVE
Nitrite: NEGATIVE
Protein, ur: NEGATIVE mg/dL
Specific Gravity, Urine: 1.019 (ref 1.005–1.030)
pH: 6 (ref 5.0–8.0)

## 2023-12-04 LAB — COMPREHENSIVE METABOLIC PANEL
ALT: 16 U/L (ref 0–44)
AST: 16 U/L (ref 15–41)
Albumin: 3.3 g/dL — ABNORMAL LOW (ref 3.5–5.0)
Alkaline Phosphatase: 64 U/L (ref 38–126)
Anion gap: 9 (ref 5–15)
BUN: 18 mg/dL (ref 6–20)
CO2: 24 mmol/L (ref 22–32)
Calcium: 9.2 mg/dL (ref 8.9–10.3)
Chloride: 107 mmol/L (ref 98–111)
Creatinine, Ser: 0.76 mg/dL (ref 0.44–1.00)
GFR, Estimated: >60 mL/min (ref >60)
Glucose, Bld: 92 mg/dL (ref 70–99)
Potassium: 3.6 mmol/L (ref 3.5–5.1)
Sodium: 140 mmol/L (ref 135–145)
Total Bilirubin: 0.7 mg/dL (ref 0.0–1.2)
Total Protein: 6 g/dL — ABNORMAL LOW (ref 6.5–8.1)

## 2023-12-04 LAB — POCT URINE DRUG SCREEN - MANUAL ENTRY (I-SCREEN)
POC Amphetamine UR: NOT DETECTED
POC Buprenorphine (BUP): NOT DETECTED
POC Cocaine UR: POSITIVE — AB
POC Marijuana UR: NOT DETECTED
POC Methadone UR: NOT DETECTED
POC Methamphetamine UR: NOT DETECTED
POC Morphine: NOT DETECTED
POC Oxazepam (BZO): NOT DETECTED
POC Oxycodone UR: NOT DETECTED
POC Secobarbital (BAR): NOT DETECTED

## 2023-12-04 LAB — LIPID PANEL
Cholesterol: 184 mg/dL (ref 0–200)
HDL: 66 mg/dL (ref >40)
LDL Cholesterol: 96 mg/dL (ref 0–99)
Total CHOL/HDL Ratio: 2.8 {ratio}
Triglycerides: 109 mg/dL (ref <150)
VLDL: 22 mg/dL (ref 0–40)

## 2023-12-04 LAB — TSH: TSH: 0.982 u[IU]/mL (ref 0.350–4.500)

## 2023-12-04 LAB — CBC
HCT: 44.5 % (ref 36.0–46.0)
Hemoglobin: 14.9 g/dL (ref 12.0–15.0)
MCH: 30.7 pg (ref 26.0–34.0)
MCHC: 33.5 g/dL (ref 30.0–36.0)
MCV: 91.6 fL (ref 80.0–100.0)
Platelets: 286 10*3/uL (ref 150–400)
RBC: 4.86 MIL/uL (ref 3.87–5.11)
RDW: 12.4 % (ref 11.5–15.5)
WBC: 13.6 10*3/uL — ABNORMAL HIGH (ref 4.0–10.5)
nRBC: 0 % (ref 0.0–0.2)

## 2023-12-04 LAB — HEMOGLOBIN A1C
Hgb A1c MFr Bld: 5.1 % (ref 4.8–5.6)
Mean Plasma Glucose: 99.67 mg/dL

## 2023-12-04 MED ORDER — QUETIAPINE FUMARATE 100 MG PO TABS: 100.0000 mg | ORAL_TABLET | Freq: Every day | ORAL | Status: DC

## 2023-12-04 MED ORDER — ACETAMINOPHEN 325 MG PO TABS
650.0000 mg | ORAL_TABLET | Freq: Four times a day (QID) | ORAL | Status: DC | PRN
  Administered 2023-12-05 – 2023-12-06 (×2): 650 mg via ORAL
  Filled 2023-12-04 (×2): qty 2

## 2023-12-04 MED ORDER — HALOPERIDOL 5 MG PO TABS: 5.0000 mg | ORAL_TABLET | Freq: Three times a day (TID) | ORAL | Status: DC | PRN

## 2023-12-04 MED ORDER — AMPHETAMINE-DEXTROAMPHETAMINE 10 MG PO TABS
20.0000 mg | ORAL_TABLET | Freq: Two times a day (BID) | ORAL | Status: DC
  Administered 2023-12-05 (×2): 20 mg via ORAL
  Filled 2023-12-04 (×2): qty 2

## 2023-12-04 MED ORDER — TRAZODONE HCL 100 MG PO TABS
100.0000 mg | ORAL_TABLET | Freq: Every evening | ORAL | Status: DC | PRN
  Administered 2023-12-04: 100 mg via ORAL
  Filled 2023-12-04: qty 1

## 2023-12-04 MED ORDER — FLUOXETINE HCL 20 MG PO CAPS
40.0000 mg | ORAL_CAPSULE | Freq: Every day | ORAL | Status: DC
  Administered 2023-12-05: 40 mg via ORAL
  Filled 2023-12-04 (×3): qty 2

## 2023-12-04 MED ORDER — DIPHENHYDRAMINE HCL 25 MG PO CAPS: 50.0000 mg | ORAL_CAPSULE | Freq: Three times a day (TID) | ORAL | Status: DC | PRN

## 2023-12-04 MED ORDER — ATORVASTATIN CALCIUM 20 MG PO TABS
20.0000 mg | ORAL_TABLET | Freq: Every day | ORAL | Status: DC
Start: 2023-12-05 — End: 2023-12-10
  Administered 2023-12-05 – 2023-12-10 (×6): 20 mg via ORAL
  Filled 2023-12-04 (×7): qty 1

## 2023-12-04 MED ORDER — BUSPIRONE HCL 15 MG PO TABS
30.0000 mg | ORAL_TABLET | Freq: Every day | ORAL | Status: DC
  Administered 2023-12-05 – 2023-12-07 (×3): 30 mg via ORAL
  Filled 2023-12-04 (×5): qty 2

## 2023-12-04 MED ORDER — MAGNESIUM HYDROXIDE 400 MG/5ML PO SUSP
30.0000 mL | Freq: Every day | ORAL | Status: DC | PRN
  Administered 2023-12-05: 30 mL via ORAL
  Filled 2023-12-04: qty 30

## 2023-12-04 MED ORDER — ALUM & MAG HYDROXIDE-SIMETH 200-200-20 MG/5ML PO SUSP: 30.0000 mL | ORAL | Status: DC | PRN

## 2023-12-04 MED ORDER — VENLAFAXINE HCL ER 150 MG PO CP24
150.0000 mg | ORAL_CAPSULE | Freq: Every day | ORAL | Status: DC
  Administered 2023-12-05 – 2023-12-07 (×3): 150 mg via ORAL
  Filled 2023-12-04 (×5): qty 1

## 2023-12-04 NOTE — Progress Notes (Signed)
   12/04/23 2200  Psych Admission Type (Psych Patients Only)  Admission Status Voluntary  Psychosocial Assessment  Patient Complaints Anxiety;Depression  Eye Contact Fair  Facial Expression Flat;Sad  Affect Anxious  Speech Logical/coherent  Interaction Assertive  Motor Activity Slow  Appearance/Hygiene Unremarkable  Behavior Characteristics Cooperative;Appropriate to situation  Mood Depressed  Thought Process  Coherency WDL  Content WDL  Delusions None reported or observed  Perception WDL  Hallucination None reported or observed  Judgment Poor  Confusion None  Danger to Self  Current suicidal ideation? Denies  Agreement Not to Harm Self Yes  Description of Agreement verbal  Danger to Others  Danger to Others None reported or observed

## 2023-12-04 NOTE — [facility] Group Notes (Signed)
[facility] Group Notes:  (Nursing/MHT/Case Management/Adjunct)  Date:  12/04/2023  Time:  2000  Type of Therapy:   wrap up group  Participation Level:  Did Not Attend  Participation Quality:   did not attend  Affect:   did not attend  Cognitive:   did not attend  Insight:  None  Engagement in Group:   did not attend  Modes of Intervention:   did not attend  Summary of Progress/Problems:  Fay Records 12/04/2023, 11:11 PM
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
## 2023-12-04 NOTE — Progress Notes (Signed)
   THERAPIST PROGRESS NOTE  Session Time: 10:00am-10:20am  Participation Level: Active  Behavioral Response: DisheveledAlertDepressed  Type of Therapy: Individual Therapy  Treatment Goals addressed: depression, suicidal thoughts  ProgressTowards Goals: Not Progressing  Interventions: Solution Focused  Summary: Faithlynn S Locust is a 53 y.o. female who presents with MDD recurrent, severe and SI.   Suicidal/Homicidal: Yeswithout intent/plan  Therapist Response: Amey presented very depressed and suicidal with no plan. Has been feeling depressed for about 1 week on and off. Long hx of depression and suicidal episodes. Has eaten today, but appetite has been inconsistent. Sleep has been inconsistent over past several days, did not sleep last night. Feeling depressed, hopeless, rumination "I don't want to be here no more". Does not want to leave house or be around anyone. Isolating.    Plan: Return again in 1 weeks.  Diagnosis: Major depressive disorder, recurrent, severe w/o psychotic behavior (HCC)  Suicidal ideation  Collaboration of Care: Other provider involved in patient's care AEB contacted Everlene Balls for recommendation to [facility] for voluntary admission. Provided Kayren with instructions.  Patient/Guardian was advised Release of Information must be obtained prior to any record release in order to collaborate their care with an outside provider. Patient/Guardian was advised if they have not already done so to contact the registration department to sign all necessary forms in order for [place] to release information regarding their care.   Consent: Patient/Guardian gives verbal consent for treatment and assignment of benefits for services provided during this visit. Patient/Guardian expressed understanding and agreed to proceed.   Chryl Heck [place], LCSW 12/04/2023
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
## 2023-12-04 NOTE — Progress Notes (Signed)
Pt has been accepted to [facility] on 12/04/2023 Bed assignment: 403-1  Pt meets inpatient criteria per: Malachy Chamber NP  Attending Physician will be: .Phineas Inches, MD   Report can be called to: Adult unit: [phone]  Pt can arrive after pending labs, vol, EKG, UDS.   Care Team Notified: [facility] Rona Ravens RN, [place] Noe Fiscus RN, Estill Cotta NP, Roseanne Reno RN, Devinny Harrington NT   [place] Jesyka Slaght LCSW-A   12/04/2023 11:56 AM
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
## 2023-12-04 NOTE — ED Notes (Signed)
Safe transport here to transport to [facility]. Pt a/o, & ambulatory. Safety maintained.

## 2023-12-04 NOTE — Tx Team (Signed)
Initial Treatment Plan 12/04/2023 6:36 PM [name] ZOX:[id number]    PATIENT STRESSORS: Financial difficulties   Loss of Relationship   Substance abuse   Traumatic event     PATIENT STRENGTHS: Ability for insight  Physical Health    PATIENT IDENTIFIED PROBLEMS: Worsening Depression  Anxiety                   DISCHARGE CRITERIA:  Improved stabilization in mood, thinking, and/or behavior  PRELIMINARY DISCHARGE PLAN: Return to previous living arrangement  PATIENT/FAMILY INVOLVEMENT: This treatment plan has been presented to and reviewed with the patient, Rebecca Andrade.The patient has been given the opportunity to ask questions and make suggestions.  Roseanne Reno, RN 12/04/2023, 6:36 PM

## 2023-12-04 NOTE — Plan of Care (Addendum)
Problem: Education: Goal: Verbalization of understanding the information provided will improve Outcome: Progressing   Problem: Safety: Goal: Periods of time without injury will increase Outcome: Progressing

## 2023-12-04 NOTE — Discharge Instructions (Addendum)
Please continue to take medications as directed. If your symptoms return, worsen, or persist please call your 911, report to local ER, or contact crisis hotline. Please do not drink alcohol or use any illegal substances while taking prescription medications.

## 2023-12-05 ENCOUNTER — Encounter (HOSPITAL_COMMUNITY): Payer: Self-pay

## 2023-12-05 DIAGNOSIS — F332 Major depressive disorder, recurrent severe without psychotic features: Secondary | ICD-10-CM

## 2023-12-05 DIAGNOSIS — G4700 Insomnia, unspecified: Secondary | ICD-10-CM | POA: Insufficient documentation

## 2023-12-05 DIAGNOSIS — F4010 Social phobia, unspecified: Secondary | ICD-10-CM | POA: Insufficient documentation

## 2023-12-05 MED ORDER — HYDROXYZINE HCL 25 MG PO TABS
25.0000 mg | ORAL_TABLET | Freq: Three times a day (TID) | ORAL | Status: DC | PRN
  Administered 2023-12-05 – 2023-12-09 (×8): 25 mg via ORAL
  Filled 2023-12-05 (×8): qty 1

## 2023-12-05 MED ORDER — WHITE PETROLATUM EX OINT
TOPICAL_OINTMENT | CUTANEOUS | Status: AC
  Filled 2023-12-05: qty 5

## 2023-12-05 MED ORDER — LINACLOTIDE 290 MCG PO CAPS
290.0000 ug | ORAL_CAPSULE | Freq: Every day | ORAL | Status: DC
  Administered 2023-12-06 – 2023-12-10 (×5): 290 ug via ORAL
  Filled 2023-12-05 (×6): qty 1

## 2023-12-05 NOTE — Group Note (Signed)
Date:  12/05/2023 Time:  4:28 PM  Group Topic/Focus:  Dimensions of Wellness:   The focus of this group is to introduce the topic of wellness and discuss the role each dimension of wellness plays in total health.    Participation Level:  Did Not Attend  Participation Quality:   n/a  Affect:   n/a  Cognitive:   n/a  Insight: None  Engagement in Group:   n/a  Modes of Intervention:   n/a  Additional Comments:   Pt did not attend.   Edmund Hilda Vicci Reder 12/05/2023, 4:28 PM

## 2023-12-05 NOTE — Group Note (Signed)
Recreation Therapy Group Note   Group Topic:Health and Wellness  Group Date: 12/05/2023 Start Time: 0930 End Time: 1000 Facilitators: Orland Visconti-McCall, LRT,CTRS Location: [address] Dayroom   Group Topic: Wellness  Goal Area(s) Addresses:  Patient will define components of whole wellness. Patient will verbalize benefit of whole wellness.  Intervention: Music  Activity: Exercise. Patients took turns leading the group in the exercises of their choosing. The group completed three rounds of stretching and exercise. Patients were encouraged to get water, take breaks and not to over extend themselves during the course of the activity. Emphasis was placed on patients paying attention to their bodies and what exercises they are able to complete.   Education: Wellness, Building control surveyor.   Education Outcome: Acknowledges education/In group clarification offered/Needs additional education.   Affect/Mood: N/A   Participation Level: Did not attend    Clinical Observations/Individualized Feedback:      Plan: Continue to engage patient in RT group sessions 2-3x/week.   Rebecca Andrade, LRT,CTRS 12/05/2023 12:59 PM

## 2023-12-05 NOTE — Group Note (Signed)
Date:  12/05/2023 Time:  10:28 PM  Group Topic/Focus:  Wrap-Up Group:   The focus of this group is to help patients review their daily goal of treatment and discuss progress on daily workbooks.    Additional Comments:   Pt was encouraged, but opted out of attending wrap up group this evening.   Chrisandra Netters 12/05/2023, 10:28 PM

## 2023-12-05 NOTE — [facility] Suicide Risk Assessment (Signed)
[facility] INPATIENT:  Family/Significant Other Suicide Prevention Education  Suicide Prevention Education:  Patient Refusal for Family/Significant Other Suicide Prevention Education: The patient Rebecca Andrade has refused to provide written consent for family/significant other to be provided Family/Significant Other Suicide Prevention Education during admission and/or prior to discharge.  Physician notified.  Kathi Der 12/05/2023, 1:11 PM

## 2023-12-05 NOTE — H&P (Signed)
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 feelings will improve, Ability to disclose and discuss suicidal ideas, Ability to demonstrate self-control will improve, Ability to identify and develop effective coping behaviors will improve, and Ability to maintain clinical measurements within normal limits will improve  -- Long Term Goals: Improvement in symptoms so as ready for  discharge    3. Medical Issues Being Addressed:  Tobacco Use Disorder -- Nicotine patch not warranted (denies nicotine use)  Hyperlipidemia  -- Continue 20 mg oral, daily   Chronic Constipation  -- Restart Linzess 290 mcg oral, daily  PRN medications  --Tylenol 650 mg every 6 hours, as needed, mild pain --Maalox/Mylanta 30 mL oral every 4 hours as needed, indigestion --Milk of Magnesia 30 mL oral daily as needed, mild constipation   Labs reviewed: WBC elevated at 13.6 Urinalysis is unremarkable UDS positive for cocaine QT/QTcB 352/387   4. Discharge Planning:   -- Social work and case management to assist with discharge planning and identification of hospital follow-up needs prior to discharge  -- Estimated LOS: 5-7 days  -- Discharge Concerns: Need to establish a safety plan; Medication compliance and effectiveness  -- Discharge Goals: Return home with outpatient referrals for mental health follow-up including medication management/psychotherapy  I certify that inpatient services furnished can reasonably be expected to improve the patient's condition.    Norma Fredrickson, NP 1/29/20252:31 PM
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
## 2023-12-05 NOTE — Plan of Care (Signed)
Problem: Education: Goal: Knowledge of [facility] Education information/materials will improve Outcome: Progressing Goal: Verbalization of understanding the information provided will improve Outcome: Progressing

## 2023-12-05 NOTE — Group Note (Signed)
Date:  12/05/2023 Time:  11:52 AM  Group Topic/Focus:  Goals Group:   The focus of this group is to help patients establish daily goals to achieve during treatment and discuss how the patient can incorporate goal setting into their daily lives to aide in recovery. Orientation:   The focus of this group is to educate the patient on the purpose and policies of crisis stabilization and provide a format to answer questions about their admission.  The group details unit policies and expectations of patients while admitted.    Participation Level:  Did Not Attend  Participation Quality:   n/a  Affect:   n/a  Cognitive:   n/a  Insight: None  Engagement in Group:   n/a  Modes of Intervention:   n/a  Additional Comments:   Pt did not attend.  Edmund Hilda Harnoor Reta 12/05/2023, 11:52 AM

## 2023-12-05 NOTE — Progress Notes (Incomplete)
53 year old Caucasian female, divorced, lives with her daughter, unemployed.  History of MDD recurrent and probable substance use disorder.  Prescribed two different antidepressants, 40 mg of IR Adderall and buspirone in the community.  Presented with worsening depression associated with suicidal thoughts.  Intoxicated with cocaine which she dismissed as a contamination.  No BAL drawn but patient appears very flushed and distraught. Agrees to discontinuation of fluoxetine and optimization of venlafaxine.  We will gather collateral from family as that will shed insight into the full picture.

## 2023-12-05 NOTE — Progress Notes (Addendum)
Pt denied SI/HI/AVH this morning. Pt rated her depression a 9/10, anxiety a 6/10. Pt presents with a depressed and flat mood this morning. Pt has been preoccupied with having her home medications restarted. Pt has been cooperative throughout the shift. Pt given scheduled medications as prescribed. Q15 min checks verified for safety. Patient verbally contracts for safety. Patient compliant with medications and treatment plan. Patient is interacting well on the unit. Pt is safe on the unit.   12/05/23 0949  Psych Admission Type (Psych Patients Only)  Admission Status Voluntary  Psychosocial Assessment  Patient Complaints Anxiety;Depression  Eye Contact Fair  Facial Expression Flat;Sad  Affect Anxious;Sad  Speech Logical/coherent  Interaction Assertive  Motor Activity Fidgety  Appearance/Hygiene Unremarkable  Behavior Characteristics Cooperative;Anxious  Mood Depressed;Anxious  Thought Process  Coherency WDL  Content WDL  Delusions None reported or observed  Perception WDL  Hallucination None reported or observed  Judgment Impaired  Confusion None  Danger to Self  Current suicidal ideation? Denies  Agreement Not to Harm Self Yes  Description of Agreement verbal  Danger to Others  Danger to Others None reported or observed
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
## 2023-12-05 NOTE — BH IP Treatment Plan (Signed)
Interdisciplinary Treatment and Diagnostic Plan Update  12/05/2023 Time of Session: 1125 Rebecca Andrade MRN: [id number]  Principal Diagnosis: MDD (major depressive disorder), recurrent episode, severe (HCC)  Secondary Diagnoses: Principal Problem:   MDD (major depressive disorder), recurrent episode, severe (HCC) Active Problems:   ADD (attention deficit disorder)   Chronic constipation   Hyperlipidemia   Social anxiety disorder   Insomnia   Current Medications:  Current Facility-Administered Medications  Medication Dose Route Frequency Provider Last Rate Last Admin   acetaminophen (TYLENOL) tablet 650 mg  650 mg Oral Q6H PRN Maryagnes Amos, FNP   650 mg at 12/05/23 1522   alum & mag hydroxide-simeth (MAALOX/MYLANTA) 200-200-20 MG/5ML suspension 30 mL  30 mL Oral Q4H PRN Maryagnes Amos, FNP       atorvastatin (LIPITOR) tablet 20 mg  20 mg Oral Daily Maryagnes Amos, FNP   20 mg at 12/05/23 0749   busPIRone (BUSPAR) tablet 30 mg  30 mg Oral Daily Maryagnes Amos, FNP   30 mg at 12/05/23 0749   haloperidol (HALDOL) tablet 5 mg  5 mg Oral TID PRN Maryagnes Amos, FNP       And   diphenhydrAMINE (BENADRYL) capsule 50 mg  50 mg Oral TID PRN Maryagnes Amos, FNP       hydrOXYzine (ATARAX) tablet 25 mg  25 mg Oral TID PRN Bennett, Christal H, NP       [START ON 12/06/2023] linaclotide (LINZESS) capsule 290 mcg  290 mcg Oral QAC breakfast Bennett, Christal H, NP       magnesium hydroxide (MILK OF MAGNESIA) suspension 30 mL  30 mL Oral Daily PRN Starkes-Perry, Juel Burrow, FNP       traZODone (DESYREL) tablet 100 mg  100 mg Oral QHS PRN Sindy Guadeloupe, NP   100 mg at 12/04/23 2124   venlafaxine XR (EFFEXOR-XR) 24 hr capsule 150 mg  150 mg Oral Q breakfast Maryagnes Amos, FNP   150 mg at 12/05/23 1191   PTA Medications: Medications Prior to Admission  Medication Sig Dispense Refill Last Dose/Taking   amphetamine-dextroamphetamine (ADDERALL) 10  MG tablet Take 2 in am and 2 at noon (Patient taking differently: Take 20 mg by mouth 2 (two) times daily with breakfast and lunch.) 120 tablet 0    atorvastatin (LIPITOR) 20 MG tablet Take 1 tablet (20 mg total) by mouth daily. 90 tablet 3    busPIRone (BUSPAR) 30 MG tablet Take 1 tablet (30 mg total) by mouth daily. 30 tablet 2    Fezolinetant (VEOZAH) 45 MG TABS Take 1 tablet (45 mg total) by mouth daily. 90 tablet 1    FLUoxetine (PROZAC) 40 MG capsule Take 1 capsule (40 mg total) by mouth daily. 30 capsule 2    linaclotide (LINZESS) 290 MCG CAPS capsule TAKE 1 CAPSULE BY MOUTH EVERY DAY BEFORE BREAKFAST (Patient taking differently: Take 290 mcg by mouth daily before breakfast.) 90 capsule 3    QUEtiapine (SEROQUEL) 100 MG tablet Take 1 tablet (100 mg total) by mouth at bedtime. 30 tablet 2    venlafaxine XR (EFFEXOR-XR) 150 MG 24 hr capsule Take 1 capsule (150 mg total) by mouth daily with breakfast. 30 capsule 2     Patient Stressors: Financial difficulties   Loss of Relationship   Substance abuse   Traumatic event    Patient Strengths: Ability for insight  Physical Health   Treatment Modalities: Medication Management, Group therapy, Case management,  1 to 1 session with  clinician, Psychoeducation, Recreational therapy.   Physician Treatment Plan for Primary Diagnosis: MDD (major depressive disorder), recurrent episode, severe (HCC) Long Term Goal(s): Improvement in symptoms so as ready for discharge   Short Term Goals: Ability to identify changes in lifestyle to reduce recurrence of condition will improve Ability to verbalize feelings will improve Ability to disclose and discuss suicidal ideas Ability to demonstrate self-control will improve Ability to identify and develop effective coping behaviors will improve Ability to maintain clinical measurements within normal limits will improve  Medication Management: Evaluate patient's response, side effects, and tolerance of  medication regimen.  Therapeutic Interventions: 1 to 1 sessions, Unit Group sessions and Medication administration.  Evaluation of Outcomes: Not Progressing  Physician Treatment Plan for Secondary Diagnosis: Principal Problem:   MDD (major depressive disorder), recurrent episode, severe (HCC) Active Problems:   ADD (attention deficit disorder)   Chronic constipation   Hyperlipidemia   Social anxiety disorder   Insomnia  Long Term Goal(s): Improvement in symptoms so as ready for discharge   Short Term Goals: Ability to identify changes in lifestyle to reduce recurrence of condition will improve Ability to verbalize feelings will improve Ability to disclose and discuss suicidal ideas Ability to demonstrate self-control will improve Ability to identify and develop effective coping behaviors will improve Ability to maintain clinical measurements within normal limits will improve     Medication Management: Evaluate patient's response, side effects, and tolerance of medication regimen.  Therapeutic Interventions: 1 to 1 sessions, Unit Group sessions and Medication administration.  Evaluation of Outcomes: Not Progressing   RN Treatment Plan for Primary Diagnosis: MDD (major depressive disorder), recurrent episode, severe (HCC) Long Term Goal(s): Knowledge of disease and therapeutic regimen to maintain health will improve  Short Term Goals: Ability to verbalize frustration and anger appropriately will improve, Ability to demonstrate self-control, Ability to participate in decision making will improve, Ability to verbalize feelings will improve, Ability to disclose and discuss suicidal ideas, and Ability to identify and develop effective coping behaviors will improve  Medication Management: RN will administer medications as ordered by provider, will assess and evaluate patient's response and provide education to patient for prescribed medication. RN will report any adverse and/or side  effects to prescribing provider.  Therapeutic Interventions: 1 on 1 counseling sessions, Psychoeducation, Medication administration, Evaluate responses to treatment, Monitor vital signs and CBGs as ordered, Perform/monitor CIWA, COWS, AIMS and Fall Risk screenings as ordered, Perform wound care treatments as ordered.  Evaluation of Outcomes: Not Progressing   LCSW Treatment Plan for Primary Diagnosis: MDD (major depressive disorder), recurrent episode, severe (HCC) Long Term Goal(s): Safe transition to appropriate next level of care at discharge, Engage patient in therapeutic group addressing interpersonal concerns.  Short Term Goals: Engage patient in aftercare planning with referrals and resources, Increase social support, Increase ability to appropriately verbalize feelings, Increase emotional regulation, Facilitate patient progression through stages of change regarding substance use diagnoses and concerns, Identify triggers associated with mental health/substance abuse issues, and Increase skills for wellness and recovery  Therapeutic Interventions: Assess for all discharge needs, 1 to 1 time with Social worker, Explore available resources and support systems, Assess for adequacy in community support network, Educate family and significant other(s) on suicide prevention, Complete Psychosocial Assessment, Interpersonal group therapy.  Evaluation of Outcomes: Not Progressing   Progress in Treatment: Attending groups: No. Participating in groups: No. and As evidenced by:  per chart review Pt has attended 0 of 3 groups since admission Taking medication as prescribed: Yes. Toleration  medication: Yes. Family/Significant other contact made: No, will contact:  Pt declined consents for collateral contact Patient understands diagnosis: Yes. Discussing patient identified problems/goals with staff: Yes. Medical problems stabilized or resolved: Yes. Denies suicidal/homicidal ideation:  Yes. Issues/concerns per patient self-inventory: No. Other: None  New problem(s) identified: No, Describe:  None  New Short Term/Long Term Goal(s): medication stabilization, elimination of SI thoughts, development of comprehensive mental wellness plan.   Patient Goals:  "I don't have a goal"  Discharge Plan or Barriers: Patient recently admitted. CSW will continue to follow and assess for appropriate referrals and possible discharge planning.    Reason for Continuation of Hospitalization: Anxiety Depression Medication stabilization  Estimated Length of Stay: 5-7 Days  Last 3 [place] Suicide Severity Risk Score: Flowsheet Row [facility] Most recent reading at 12/04/2023  5:30 PM ED from 12/04/2023 in [facility] Most recent reading at 12/04/2023 10:52 AM Counselor from 10/16/2023 in [facility] [place] Most recent reading at 10/16/2023  8:45 AM  C-SSRS RISK CATEGORY Moderate Risk Moderate Risk Low Risk       Last PHQ 2/9 Scores:    10/16/2023    8:41 AM 08/08/2023    4:12 PM 07/31/2023   11:36 AM  Depression screen PHQ 2/9  Decreased Interest 3 2 0  Down, Depressed, Hopeless 3 2 2   PHQ - 2 Score 6 4 2   Altered sleeping 1 0 0  Tired, decreased energy 1 0 0  Change in appetite 2 1 0  Feeling bad or failure about yourself  3 2 1   Trouble concentrating 1 0 1  Moving slowly or fidgety/restless 1 0 0  Suicidal thoughts 2 1 0  PHQ-9 Score 17 8 4   Difficult doing work/chores Very difficult Somewhat difficult Very difficult    Scribe for Treatment Team: Jacinta Shoe, LCSW 12/05/2023 3:42 PM

## 2023-12-06 DIAGNOSIS — F332 Major depressive disorder, recurrent severe without psychotic features: Secondary | ICD-10-CM | POA: Diagnosis not present

## 2023-12-06 MED ORDER — QUETIAPINE FUMARATE 100 MG PO TABS
100.0000 mg | ORAL_TABLET | Freq: Every day | ORAL | Status: DC
  Administered 2023-12-06 – 2023-12-07 (×2): 100 mg via ORAL
  Filled 2023-12-06 (×4): qty 1

## 2023-12-06 MED ORDER — TRAZODONE HCL 50 MG PO TABS
50.0000 mg | ORAL_TABLET | Freq: Every evening | ORAL | Status: DC | PRN
  Administered 2023-12-06: 50 mg via ORAL
  Filled 2023-12-06: qty 1

## 2023-12-06 MED ORDER — ENSURE ENLIVE PO LIQD
237.0000 mL | Freq: Three times a day (TID) | ORAL | Status: DC
  Filled 2023-12-06 (×14): qty 237

## 2023-12-06 MED ORDER — MELATONIN 5 MG PO TABS
10.0000 mg | ORAL_TABLET | Freq: Every day | ORAL | Status: DC
  Administered 2023-12-07 – 2023-12-09 (×3): 10 mg via ORAL
  Filled 2023-12-06 (×7): qty 2

## 2023-12-06 NOTE — [facility] Group Notes (Signed)
Patient did not attend the Emotional Wellness group.

## 2023-12-06 NOTE — Progress Notes (Signed)
   12/06/23 0900  Psych Admission Type (Psych Patients Only)  Admission Status Voluntary  Psychosocial Assessment  Patient Complaints Anxiety;Depression  Eye Contact Fair  Facial Expression Flat;Sad  Affect Anxious;Sad  Speech Logical/coherent  Interaction Assertive  Motor Activity Fidgety  Appearance/Hygiene Unremarkable  Behavior Characteristics Cooperative;Anxious  Mood Depressed;Anxious  Thought Process  Coherency WDL  Content WDL  Delusions None reported or observed  Perception WDL  Hallucination None reported or observed  Judgment Impaired  Confusion None  Danger to Self  Current suicidal ideation? Denies  Agreement Not to Harm Self Yes  Description of Agreement verbal  Danger to Others  Danger to Others None reported or observed

## 2023-12-06 NOTE — Plan of Care (Signed)
  Problem: Education: Goal: Emotional status will improve Outcome: Progressing Goal: Verbalization of understanding the information provided will improve Outcome: Progressing   Problem: Activity: Goal: Interest or engagement in activities will improve Outcome: Progressing Goal: Sleeping patterns will improve Outcome: Progressing   Problem: Coping: Goal: Ability to demonstrate self-control will improve Outcome: Progressing   Problem: Health Behavior/Discharge Planning: Goal: Identification of resources available to assist in meeting health care needs will improve Outcome: Progressing   Problem: Physical Regulation: Goal: Ability to maintain clinical measurements within normal limits will improve Outcome: Progressing

## 2023-12-06 NOTE — Plan of Care (Signed)
Problem: Education: Goal: Knowledge of [facility] Education information/materials will improve Outcome: Progressing Goal: Emotional status will improve Outcome: Progressing Goal: Mental status will improve Outcome: Progressing Goal: Verbalization of understanding the information provided will improve Outcome: Progressing   Problem: Activity: Goal: Interest or engagement in activities will improve Outcome: Progressing Goal: Sleeping patterns will improve Outcome: Progressing

## 2023-12-06 NOTE — Progress Notes (Signed)
D: Pt alert and oriented. Pt rates depression 0/10 and anxiety 8/10.  Pt denies experiencing any SI/HI, but endorsing anxiety and unable to sleep. Desires to be put back on Seroquel 100. Took PRN hydroxyzine 3 times which wasn't effective. RN offered 100mg  Trazodone. She endorsed it was too strong and gave her side effects but mentioned she was willing to take 50mg . RN informed provider which put in order for 50mg  Trazodone and 10mg  melatonin. She refused the melatonin but took the 50mg  Trazodone.  A: Scheduled medications administered to pt, per MD orders. Support and encouragement provided. Frequent verbal contact made. Routine safety checks conducted q15 minutes.   R: No adverse drug reactions noted. Pt verbally contracts for safety at this time. Pt complaint with medications and treatment plan. Pt interacts well with others on the unit. Pt remains safe at this time. Will continue to monitor.

## 2023-12-06 NOTE — Group Note (Signed)
LCSW Group Therapy Note   Group Date: 12/06/2023 Start Time: 1100 End Time: 1200   Type of Therapy:  Group Therapy  Topic:  Understanding Your Path to Change  Participation: did not attend  Objective:  The goal is to help participants understand the stages of change, identify where they currently are in the process, and provide actionable next steps to continue moving forward in their journey of change.  Goals: Learn about the six stages of change:  Precontemplation, Contemplation, Preparation, Action, Maintenance, and Relapse Reflect on Current Change Efforts:  Recognize which stage participants are in regarding to a personal change. Plan Next Steps for Moving Forward:  Create an action plan based on their current stage of change.  Class Summary:  In this session, we explored the Stages of Change as a framework to understand the process of change.  We discussed how each stage helps individuals recognize where they are in their personal journey and used the Stages of Change Worksheet for self-reflection.  Participants answered questions to better understand their current stage, challenges, and progress. We also emphasized the importance of moving forward, even if setbacks (Relapse) occur, and created actionable steps to help participants continue progressing. By the end of the session, participants gained a clearer understanding of their path to change and left with a clear plan for next steps.  Therapeutic Modalities:  Elements of CBT (cognitive restructuring, problem solving)  Element of DBT (mindfulness, distress tolerance)   Kevin Mario O Lawrence Roldan, LCSWA 12/06/2023  6:45 PM
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
## 2023-12-06 NOTE — [facility] Group Notes (Signed)
[facility] Group Notes:  (Nursing/MHT/Case Management/Adjunct)  Date:  12/06/2023  Time:  8:47 PM  Type of Therapy:   Wrap-up group  Participation Level:  Did Not Attend  Participation Quality:    Affect:    Cognitive:    Insight:    Engagement in Group:    Modes of Intervention:    Summary of Progress/Problems: Pt refused group.  Rebecca Andrade 12/06/2023, 8:47 PM

## 2023-12-07 DIAGNOSIS — F332 Major depressive disorder, recurrent severe without psychotic features: Secondary | ICD-10-CM | POA: Diagnosis not present

## 2023-12-07 MED ORDER — BUSPIRONE HCL 15 MG PO TABS
30.0000 mg | ORAL_TABLET | Freq: Two times a day (BID) | ORAL | Status: DC
Start: 2023-12-07 — End: 2023-12-10
  Administered 2023-12-07 – 2023-12-10 (×6): 30 mg via ORAL
  Filled 2023-12-07 (×8): qty 2

## 2023-12-07 MED ORDER — VENLAFAXINE HCL ER 75 MG PO CP24
225.0000 mg | ORAL_CAPSULE | Freq: Every day | ORAL | Status: DC
Start: 2023-12-08 — End: 2023-12-10
  Administered 2023-12-08 – 2023-12-10 (×3): 225 mg via ORAL
  Filled 2023-12-07 (×4): qty 3

## 2023-12-07 MED ORDER — VENLAFAXINE HCL ER 75 MG PO CP24
75.0000 mg | ORAL_CAPSULE | Freq: Once | ORAL | Status: AC
  Administered 2023-12-07: 75 mg via ORAL
  Filled 2023-12-07 (×2): qty 1

## 2023-12-07 NOTE — [facility] Group Notes (Signed)
Spiritual care group facilitated by Chaplain Katy Denessa Cavan, [facility]  Group focused on topic of strength. Group members reflected on what thoughts and feelings emerge when they hear this topic. They then engaged in facilitated dialog around how strength is present in their lives. This dialog focused on representing what strength had been to them in their lives (images and patterns given) and what they saw as helpful in their life now (what they needed / wanted).  Activity drew on narrative framework.  Patient Progress: Did not attend.  

## 2023-12-07 NOTE — [facility] Group Notes (Signed)
[facility] Group Notes:  (Nursing/MHT/Case Management/Adjunct)  Date:  12/07/2023  Time:  8:31 PM  Type of Therapy:   AA Group  Participation Level:  Active  Participation Quality:  Appropriate  Affect:  Appropriate  Cognitive:  Appropriate  Insight:  Appropriate  Engagement in Group:  Engaged  Modes of Intervention:  Education  Summary of Progress/Problems: Pt attended [employer].  Rebecca Andrade 12/07/2023, 8:31 PM

## 2023-12-07 NOTE — Group Note (Signed)
Date:  12/07/2023 Time:  2:05 PM  Group Topic/Focus:  Goals Group:   The focus of this group is to help patients establish daily goals to achieve during treatment and discuss how the patient can incorporate goal setting into their daily lives to aide in recovery. Orientation:   The focus of this group is to educate the patient on the purpose and policies of crisis stabilization and provide a format to answer questions about their admission.  The group details unit policies and expectations of patients while admitted.    Participation Level:  Did Not Attend  Participation Quality:   n/a  Affect:   n/a  Cognitive:   n/a  Insight: None  Engagement in Group:   n/a  Modes of Intervention:   n/a  Additional Comments:   Pt did not attend.   Edmund Hilda Alinda Egolf 12/07/2023, 2:05 PM

## 2023-12-07 NOTE — Progress Notes (Signed)
   12/06/23 2300  Psych Admission Type (Psych Patients Only)  Admission Status Voluntary  Psychosocial Assessment  Patient Complaints Depression;Hopelessness;Anxiety (Pt face was red and tear stained.  Pt stated, "I was up all night crying.  I need my Seroquel to sleep."  Seroquel given at bedtime.  She declined Ensure, "I can't stand the taste.")  Eye Contact Glaring;Intense;Fair  Facial Expression Sad  Affect Anxious;Depressed  Speech Logical/coherent  Interaction No initiation;Cautious;Guarded  Behavior Characteristics Resistant to care;Cooperative  Mood Depressed ("My day was alright."  Denied SI/HI/AVH.  Endorsed passive SI (if she died it would be ok))  Thought Process  Coherency WDL  Content WDL  Delusions None reported or observed  Perception WDL  Hallucination None reported or observed ("That's not a problem for me")  Judgment Impaired  Confusion None  Danger to Self  Current suicidal ideation? Denies ("not today")  Agreement Not to Harm Self Yes  Description of Agreement Verbal  Danger to Others  Danger to Others None reported or observed
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
## 2023-12-07 NOTE — Group Note (Signed)
Recreation Therapy Group Note   Group Topic:Problem Solving  Group Date: 12/07/2023 Start Time: 0930 End Time: 1015 Facilitators: Macky Galik-McCall, LRT,CTRS Location: [address] Dayroom   Group Topic: Problem Solving  Goal Area(s) Addresses:  Patient will effectively work in a team with other group members. Patient will verbalize importance of using appropriate problem solving techniques.  Patient will identify positive change associated with effective problem solving skills.   Intervention: Worksheets, Music  Activity: Patients were given two sheets of brain teasers. Patients were to work through each puzzle to come up with the answer. Patients had the option of working together or individually.    Education: Problem solving, Use of skills post discharge  Education Outcome: Acknowledges understanding/In group clarification offered/Needs additional education.    Affect/Mood: Appropriate   Participation Level: Engaged   Participation Quality: Independent   Behavior: Appropriate   Speech/Thought Process: Focused   Insight: Good   Judgement: Good   Modes of Intervention: Music and Worksheet   Patient Response to Interventions:  Engaged   Education Outcome:  In group clarification offered    Clinical Observations/Individualized Feedback: Pt was engaged and social with peers. Pt was focused and worked well with peers in completing the teasers.     Plan: Continue to engage patient in RT group sessions 2-3x/week.   Trust Crago-McCall, LRT,CTRS 12/07/2023 12:23 PM

## 2023-12-07 NOTE — Plan of Care (Signed)
  Problem: Activity: Goal: Sleeping patterns will improve Outcome: Progressing   Problem: Activity: Goal: Interest or engagement in activities will improve Outcome: Not Progressing   Problem: Coping: Goal: Ability to verbalize frustrations and anger appropriately will improve Outcome: Not Progressing   

## 2023-12-07 NOTE — Plan of Care (Signed)
  Problem: Activity: Goal: Sleeping patterns will improve Outcome: Progressing   Problem: Coping: Goal: Ability to verbalize frustrations and anger appropriately will improve Outcome: Progressing Goal: Ability to demonstrate self-control will improve Outcome: Progressing   Problem: Health Behavior/Discharge Planning: Goal: Compliance with treatment plan for underlying cause of condition will improve Outcome: Progressing

## 2023-12-08 MED ORDER — NON FORMULARY: 45.0000 mg | Freq: Every day | Status: DC

## 2023-12-08 MED ORDER — MECLIZINE HCL 12.5 MG PO TABS
25.0000 mg | ORAL_TABLET | Freq: Two times a day (BID) | ORAL | Status: DC | PRN
  Administered 2023-12-08 – 2023-12-09 (×2): 25 mg via ORAL
  Filled 2023-12-08 (×3): qty 2

## 2023-12-08 MED ORDER — QUETIAPINE FUMARATE 50 MG PO TABS
150.0000 mg | ORAL_TABLET | Freq: Every day | ORAL | Status: DC
  Administered 2023-12-08: 150 mg via ORAL
  Filled 2023-12-08 (×2): qty 3

## 2023-12-08 MED ORDER — FEZOLINETANT 45 MG PO TABS: 45.0000 mg | ORAL_TABLET | Freq: Every day | ORAL | Status: DC
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
## 2023-12-08 NOTE — Plan of Care (Signed)
   Problem: Education: Goal: Knowledge of [facility] Education information/materials will improve Outcome: Progressing Goal: Emotional status will improve Outcome: Progressing Goal: Mental status will improve Outcome: Progressing Goal: Verbalization of understanding the information provided will improve Outcome: Progressing   Problem: Activity: Goal: Interest or engagement in activities will improve Outcome: Progressing Goal: Sleeping patterns will improve Outcome: Progressing   Problem: Coping: Goal: Ability to verbalize frustrations and anger appropriately will improve Outcome: Progressing Goal: Ability to demonstrate self-control will improve Outcome: Progressing   Problem: Health Behavior/Discharge Planning: Goal: Identification of resources available to assist in meeting health care needs will improve Outcome: Progressing Goal: Compliance with treatment plan for underlying cause of condition will improve Outcome: Progressing   Problem: Physical Regulation: Goal: Ability to maintain clinical measurements within normal limits will improve Outcome: Progressing   Problem: Safety: Goal: Periods of time without injury will increase Outcome: Progressing

## 2023-12-08 NOTE — Plan of Care (Signed)
   Problem: Education: Goal: Emotional status will improve Outcome: Progressing Goal: Mental status will improve Outcome: Progressing   Problem: Activity: Goal: Interest or engagement in activities will improve Outcome: Progressing   Problem: Coping: Goal: Ability to demonstrate self-control will improve Outcome: Progressing

## 2023-12-08 NOTE — [facility] Group Notes (Signed)
[facility] Group Notes:  (Nursing/MHT/Case Management/Adjunct)  Date:  12/08/2023  Time:  9:53 PM  Type of Therapy:   Wrap-up group  Participation Level:  Active  Participation Quality:  Appropriate  Affect:  Appropriate  Cognitive:  Appropriate  Insight:  Appropriate  Engagement in Group:  Engaged  Modes of Intervention:  Education  Summary of Progress/Problems: Pt goal to attend groups. Met goal. Rated day 10/10.  Rebecca Andrade 12/08/2023, 9:53 PM

## 2023-12-08 NOTE — Progress Notes (Signed)
   12/07/23 2300  Psych Admission Type (Psych Patients Only)  Admission Status Voluntary  Psychosocial Assessment  Patient Complaints Anxiety;Depression;Hopelessness  Eye Contact Fair  Facial Expression Anxious;Sad  Affect Anxious;Depressed  Speech Logical/coherent  Interaction Guarded;Cautious  Motor Activity Fidgety  Appearance/Hygiene Unremarkable  Behavior Characteristics Cooperative  Mood Depressed  Danger to Self  Current suicidal ideation? Denies  Agreement Not to Harm Self Yes  Description of Agreement Verbal  Danger to Others  Danger to Others None reported or observed

## 2023-12-08 NOTE — Group Note (Unsigned)
Date:  12/10/2023 Time:  7:36 AM  Group Topic/Focus:  Emotional Education:   The focus of this group is to discuss what feelings/emotions are, and how they are experienced.    Participation Level:  Did Not Attend  Rebecca Andrade 12/10/2023, 7:36 AM

## 2023-12-08 NOTE — Progress Notes (Signed)
   12/08/23 2104  Psych Admission Type (Psych Patients Only)  Admission Status Voluntary  Psychosocial Assessment  Patient Complaints Anxiety  Eye Contact Fair  Facial Expression Flat  Affect Depressed;Sad  Speech Logical/coherent  Interaction Assertive  Motor Activity Other (Comment) (WDL)  Appearance/Hygiene Unremarkable  Behavior Characteristics Cooperative  Mood Pleasant;Depressed;Anxious  Thought Process  Coherency WDL  Content WDL  Delusions None reported or observed  Perception WDL  Hallucination None reported or observed  Judgment Impaired  Confusion None  Danger to Self  Current suicidal ideation? Denies  Agreement Not to Harm Self Yes  Description of Agreement verbal  Danger to Others  Danger to Others None reported or observed

## 2023-12-08 NOTE — Group Note (Signed)
Date:  12/08/2023 Time:  10:00 AM  Group Topic/Focus:  Goals Group:   The focus of this group is to help patients establish daily goals to achieve during treatment and discuss how the patient can incorporate goal setting into their daily lives to aide in recovery. Orientation:   The focus of this group is to educate the patient on the purpose and policies of crisis stabilization and provide a format to answer questions about their admission.  The group details unit policies and expectations of patients while admitted.    Participation Level:  Active  Rebecca Andrade 12/08/2023, 10:00 AM

## 2023-12-08 NOTE — Progress Notes (Signed)
   12/08/23 0742  Psych Admission Type (Psych Patients Only)  Admission Status Voluntary  Psychosocial Assessment  Patient Complaints Anxiety  Eye Contact Fair  Facial Expression Flat  Affect Depressed  Speech Logical/coherent  Interaction Assertive  Motor Activity Other (Comment) (WDL)  Appearance/Hygiene Unremarkable  Behavior Characteristics Cooperative  Mood Anxious  Thought Process  Coherency WDL  Content WDL  Delusions None reported or observed  Perception WDL  Hallucination None reported or observed  Judgment Impaired  Confusion None  Danger to Self  Current suicidal ideation? Denies  Agreement Not to Harm Self Yes  Description of Agreement verbal  Danger to Others  Danger to Others None reported or observed

## 2023-12-08 NOTE — [facility] Group Notes (Signed)
[facility] Group Notes:  (Nursing)  Date:  12/08/2023  Time:  4:25 PM  Type of Therapy:  Psychoeducational Skills  Participation Level:  Did Not Attend   Shela Nevin 12/08/2023, 4:25 PM

## 2023-12-09 MED ORDER — QUETIAPINE FUMARATE 200 MG PO TABS
200.0000 mg | ORAL_TABLET | Freq: Every day | ORAL | Status: DC
  Administered 2023-12-09: 200 mg via ORAL
  Filled 2023-12-09 (×2): qty 1
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
## 2023-12-09 NOTE — Progress Notes (Signed)
Pt verbalized she is ready to discharge and has to pay her bills.  Reviewed plan to discharge with pt and estimated length of stay.  Pt was encouraged to get details from meeting with provider tomorrow.  She is requesting an earlier appt. with OP psych provider if possible.   12/09/23 2200  Psych Admission Type (Psych Patients Only)  Admission Status Voluntary  Psychosocial Assessment  Patient Complaints None  Eye Contact Fair  Facial Expression Other (Comment) (congruent with thought process)  Affect Anxious;Appropriate to circumstance;Depressed;Constricted  Speech Logical/coherent;Other (Comment)  Interaction Assertive;Guarded  Appearance/Hygiene Disheveled  Behavior Characteristics Cooperative;Appropriate to situation;Guarded  Mood Anxious (Pt mood was "good," today and currently reported 5/10 anxiety (prn given see MAR).  Denied SI/HI/AVH. Pt questioned when is discharge and asserted that she has to pay her bills.  Pt requested to get an earlier OP psych appt. before discharging.)  Thought Process  Coherency WDL  Content WDL  Delusions None reported or observed  Perception WDL  Hallucination None reported or observed  Judgment Impaired  Danger to Self  Current suicidal ideation? Denies  Agreement Not to Harm Self Yes  Description of Agreement Verbal  Danger to Others  Danger to Others None reported or observed

## 2023-12-09 NOTE — Group Note (Signed)
Date:  12/09/2023 Time:  9:19 PM  Group Topic/Focus:  Wrap-Up Group:   The focus of this group is to help patients review their daily goal of treatment and discuss progress on daily workbooks.    Participation Level:  Active  Participation Quality:  Appropriate and Sharing  Affect:  Appropriate  Cognitive:  Appropriate  Insight: Appropriate  Engagement in Group:  Engaged  Modes of Intervention:  Activity and Socialization  Additional Comments:  Patient rated her day a 10/10. Patient shared that it was an "emotional first few days when she got here". Patient shared that she attend groups today. Patient shared her goal for today ws to be "more productive". Patient participated in activity after sharing.   Rebecca Andrade 12/09/2023, 9:19 PM

## 2023-12-09 NOTE — [facility] Group Notes (Signed)
[facility] Group Notes:  (Nursing/MHT/Case Management/Adjunct)  Date:  12/09/2023  Time:  8:59 AM  Type of Therapy:   Goals  Participation Level:  Active  Participation Quality:  Appropriate  Affect:  Appropriate  Cognitive:  Appropriate  Insight:  Appropriate  Engagement in Group:  Engaged  Modes of Intervention:  Orientation and Socialization  Summary of Progress/Problems:  Azalee Course 12/09/2023, 8:59 AM

## 2023-12-09 NOTE — [facility] Group Notes (Signed)
[facility] Group Notes:  (Nursing)  Date:  12/09/2023  Time:  1400  Type of Therapy:  Music Therapy  Participation Level:  Active  Participation Quality:  Appropriate and Attentive  Affect:  Appropriate  Cognitive:  Alert and Appropriate  Insight:  Appropriate and Good  Engagement in Group:  Engaged and Supportive  Modes of Intervention:  Activity, Exploration, Rapport Building, Socialization, and Support  Summary of Progress/Problems:  Shela Nevin 12/09/2023, 5:04 PM

## 2023-12-09 NOTE — Progress Notes (Signed)
   12/09/23 0803  Psych Admission Type (Psych Patients Only)  Admission Status Voluntary  Psychosocial Assessment  Patient Complaints None  Eye Contact Fair  Facial Expression Flat  Affect Depressed  Speech Logical/coherent  Interaction Assertive  Motor Activity Other (Comment) (WDL)  Appearance/Hygiene Unremarkable  Behavior Characteristics Cooperative  Mood Pleasant  Thought Process  Coherency WDL  Content WDL  Delusions None reported or observed  Perception WDL  Hallucination None reported or observed  Judgment Poor  Confusion None  Danger to Self  Current suicidal ideation? Denies  Agreement Not to Harm Self Yes  Description of Agreement verbal  Danger to Others  Danger to Others None reported or observed

## 2023-12-09 NOTE — Group Note (Signed)
[facility] LCSW Group Therapy Note    Group Date: 12/09/2023 Start Time: 1000 End Time: 1120  Type of Therapy and Topic:  Group Therapy:  Change/Overcoming Obstacles  Participation Level:  [facility] PARTICIPATION LEVEL: Did Not Attend  Mood:  Description of Group:   In this group patients will be encouraged to explore what they see as obstacles to their own wellness and recovery. They will be guided to discuss their thoughts, feelings, and behaviors related to these obstacles. The group will process together ways to cope with barriers, with attention given to specific choices patients can make. Each patient will be challenged to identify changes they are motivated to make in order to overcome their obstacles. This group will be process-oriented, with patients participating in exploration of their own experiences as well as giving and receiving support and challenge from other group members.  Therapeutic Goals: 1. Patient will identify personal and current obstacles as they relate to admission. 2. Patient will identify barriers that currently interfere with their wellness or overcoming obstacles.  3. Patient will identify feelings, thought process and behaviors related to these barriers. 4. Patient will identify two changes they are willing to make to overcome these obstacles:    Summary of Patient Progress   Pt was invited but but did not attend   Therapeutic Modalities:   Cognitive Behavioral Therapy Solution Focused Therapy Motivational Interviewing Relapse Prevention Therapy   Steffanie Dunn, LCSW

## 2023-12-09 NOTE — Plan of Care (Signed)
  Problem: Education: Goal: Verbalization of understanding the information provided will improve Outcome: Progressing   Problem: Activity: Goal: Sleeping patterns will improve Outcome: Progressing   Problem: Coping: Goal: Ability to verbalize frustrations and anger appropriately will improve Outcome: Progressing Goal: Ability to demonstrate self-control will improve Outcome: Progressing   Problem: Safety: Goal: Periods of time without injury will increase Outcome: Progressing

## 2023-12-09 NOTE — Progress Notes (Signed)
  On assessment, patient presented with anxiety.  Administered PRN Hydroxyzine per [facility] per patient request.

## 2023-12-09 NOTE — Progress Notes (Signed)
   12/09/23 0530  15 Minute Checks  Location Bedroom  Visual Appearance Calm  Behavior Sleeping  Sleep (Behavioral Health Patients Only)  Calculate sleep? (Click Yes once per 24 hr at 0600 safety check) Yes  Documented sleep last 24 hours 8.75

## 2023-12-10 DIAGNOSIS — F332 Major depressive disorder, recurrent severe without psychotic features: Secondary | ICD-10-CM | POA: Diagnosis not present

## 2023-12-10 MED ORDER — VENLAFAXINE HCL ER 75 MG PO CP24: 225.0000 mg | ORAL_CAPSULE | Freq: Every day | ORAL | 0 refills | Status: DC

## 2023-12-10 MED ORDER — BUSPIRONE HCL 30 MG PO TABS: 30.0000 mg | ORAL_TABLET | Freq: Two times a day (BID) | ORAL | 0 refills | Status: DC

## 2023-12-10 MED ORDER — LINACLOTIDE 290 MCG PO CAPS: 290.0000 ug | ORAL_CAPSULE | Freq: Every day | ORAL | 0 refills | Status: DC

## 2023-12-10 MED ORDER — MELATONIN 10 MG PO TABS: 10.0000 mg | ORAL_TABLET | Freq: Every day | ORAL | 0 refills | Status: DC

## 2023-12-10 MED ORDER — QUETIAPINE FUMARATE 200 MG PO TABS: 200.0000 mg | ORAL_TABLET | Freq: Every day | ORAL | 0 refills | Status: DC

## 2023-12-10 MED ORDER — HYDROXYZINE HCL 25 MG PO TABS: 25.0000 mg | ORAL_TABLET | Freq: Three times a day (TID) | ORAL | 0 refills | Status: DC | PRN

## 2023-12-10 NOTE — Progress Notes (Signed)
Pt discharged home alert and oriented x 4. All belongings returned to pt and discharge instructions reviewed with pt. Pt verbalized understanding of discharge instructions.
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
## 2023-12-10 NOTE — Care Management Important Message (Signed)
     12/10/2023       10:10 AM   Rebecca Andrade   IM printed out and given to patient.   Patient informed of right to appeal discharge, provided phone number to [facility]. Patient expressed no interest in appealing discharge at this time. CSW will continue to monitor situation.  Signed:  Lonell Stamos, LCSW-A 12/10/2023  10:10 AM

## 2023-12-10 NOTE — [facility] Suicide Risk Assessment (Signed)
Suicide Risk Assessment  Discharge Assessment    [facility] Discharge Suicide Risk Assessment   Principal Problem: MDD (major depressive disorder), recurrent severe, without psychosis (HCC) Discharge Diagnoses: Principal Problem:   MDD (major depressive disorder), recurrent severe, without psychosis (HCC) Active Problems:   ADD (attention deficit disorder)   Chronic constipation   Hyperlipidemia   Social anxiety disorder   Insomnia  HPI: Rebecca Andrade is a 53 year old CF with prior mental health diagnoses of MDD & GAD, who presented to the [facility] on 1/28 with worsening depression symptom & suicidal ideations. Pt was transferred and admitted to this hospital on same day under IVC status treatment and stabilization of her mental status.  Hospital Course: During the patient's hospitalization, patient had extensive initial psychiatric evaluation, and follow-up psychiatric evaluations every day. Psychiatric diagnoses provided upon initial assessment: As listed above. Patient's psychiatric medications were adjusted on admission: As follows: -Continue Effexor XR 150 mg, oral, daily with breakfast -Continue BuSpar 30 mg, oral, daily --Held Adderall 20 mg, oral, 2 times daily with breakfast and lunch. --Continue trazodone 100 mg, oral, at bedtime PRN, sleep --Start hydroxyzine 25 mg, oral, 3 times daily PRN, anxiety   During the hospitalization, other adjustments were made to the patient's psychiatric medication regimen. Medications at discharge are as follows: -Continue Seroquel to 200 mg nightly for sleep/mood stabilization -Continue Effexor XR 225 mg daily for depressive symptoms -Continue buspirone 30 mg twice daily he for anxiety -Continue melatonin 10 mg nightly for sleep -Continue Lipitor 20 mg daily for hypercholesterolemia   Patient's care was discussed during the interdisciplinary team meeting every day during the hospitalization. The patient  denies having side effects to prescribed psychiatric medication. The patient was evaluated each day by a clinical provider to ascertain response to treatment. Improvement was noted by the patient's report of decreasing symptoms, improved sleep and appetite, affect, medication tolerance, behavior, and participation in unit programming.  Patient was asked each day to complete a self inventory noting mood, mental status, pain, new symptoms, anxiety and concerns. Symptoms were reported as significantly decreased or resolved completely by discharge.   On day of discharge, the patient reports that their mood is stable. The patient denied having suicidal thoughts for more than 48 hours prior to discharge.  Patient denies having homicidal thoughts.  Patient denies having auditory hallucinations.  Patient denies any visual hallucinations or other symptoms of psychosis. The patient was motivated to continue taking medication with a goal of continued improvement in mental health.   The patient reports their target psychiatric symptoms of depression, anxiety and insomnia responded well to the psychiatric medications, and the patient reports overall benefit from this psychiatric hospitalization. Supportive psychotherapy was provided to the patient. The patient also participated in regular group therapy while hospitalized. Coping skills, problem solving as well as relaxation therapies were also part of the unit programming.  Labs were reviewed with the patient, and abnormal results were discussed with the patient.  The patient is able to verbalize their individual safety plan to this provider.  # It is recommended to the patient to continue psychiatric medications as prescribed, after discharge from the hospital.    # It is recommended to the patient to follow up with your outpatient psychiatric provider and PCP.  # It was discussed with the patient, the impact of alcohol, drugs, tobacco have been there overall  psychiatric and medical wellbeing, and total abstinence from substance use was recommended the patient.ed.  # Prescriptions  provided or sent directly to preferred pharmacy at discharge. Patient agreeable to plan. Given opportunity to ask questions. Appears to feel comfortable with discharge.    # In the event of worsening symptoms, the patient is instructed to call the crisis hotline (988), 911 and or go to the nearest ED for appropriate evaluation and treatment of symptoms. To follow-up with primary care provider for other medical issues, concerns and or health care needs  # Patient was discharged home with a plan to follow up as noted below.    Total Time spent with patient: 45 minutes  Musculoskeletal: Strength & Muscle Tone: within normal limits Gait & Station: normal Patient leans: N/A  Psychiatric Specialty Exam  Presentation  General Appearance:  Appropriate for Environment; Fairly Groomed  Eye Contact: Good  Speech: Clear and Coherent  Speech Volume: Normal  Handedness: Right   Mood and Affect  Mood: Euthymic  Duration of Depression Symptoms: Greater than two weeks  Affect: Congruent   Thought Process  Thought Processes: Coherent  Descriptions of Associations:Intact  Orientation:Full (Time, Place and Person)  Thought Content:Logical  History of Schizophrenia/Schizoaffective disorder:No  Duration of Psychotic Symptoms:No data recorded Hallucinations:Hallucinations: None  Ideas of Reference:None  Suicidal Thoughts:Suicidal Thoughts: No  Homicidal Thoughts:Homicidal Thoughts: No   Sensorium  Memory: Immediate Good  Judgment: Good  Insight: Good   Executive Functions  Concentration: Good  Attention Span: Good  Recall: Fair  Fund of Knowledge: Good  Language: Good   Psychomotor Activity  Psychomotor Activity: Psychomotor Activity: Normal   Assets  Assets: Communication Skills   Sleep  Sleep: Sleep:  Good Number of Hours of Sleep: 8.75   Physical Exam: Physical Exam Neurological:     Mental Status: She is alert.    Review of Systems  Psychiatric/Behavioral:  Positive for depression (Denies SI/HI, denies plan or intent). Negative for hallucinations, memory loss, substance abuse and suicidal ideas. The patient is nervous/anxious and has insomnia.    Blood pressure 112/73, pulse 64, temperature 97.8 F (36.6 C), temperature source Oral, resp. rate 16, height 5\' 7"  (1.702 m), weight 86.7 kg, last menstrual period 09/02/2018, SpO2 95%. Body mass index is 29.95 kg/m.  Mental Status Per Nursing Assessment::   On Admission:  Suicidal ideation indicated by patient Denies SI/HI, denies intent or plan at discharge. Demographic Factors:  Caucasian and Low socioeconomic status  Loss Factors: Financial problems/change in socioeconomic status  Historical Factors: Family history of mental illness or substance abuse  Risk Reduction Factors:   Positive social support and Positive coping skills or problem solving skills  Continued Clinical Symptoms:  Previous Psychiatric Diagnoses and Treatments  Cognitive Features That Contribute To Risk:  None    Suicide Risk: Mild:  There are no identifiable suicide plans, no associated intent, mild dysphoria and related symptoms, good self-control (both objective and subjective assessment), few other risk factors, and identifiable protective factors, including available and accessible social support.    Follow-up Information     [facility]. Go on 12/25/2023.   Specialty: Behavioral Health Why: You have an appointment for therapy services on 12/25/23 at 8:00 am, in person.   You have an appointment for medication management services on 12/31/23 at 8:40 am, Virtual. Contact information: [address] [place] [address] [phone]               Starleen Blue, NP 12/10/2023,  12:19 PM

## 2023-12-10 NOTE — Progress Notes (Signed)
  [facility] Adult Case Management Discharge Plan :  Will you be returning to the same living situation after discharge:  Yes,  in [place] with her son, Rebecca Andrade At discharge, do you have transportation home?: Yes,  son Rebecca Andrade will transport Do you have the ability to pay for your medications: Yes,  has [facility]  Release of information consent forms completed and in the chart;  Patient's signature needed at discharge.  Patient to Follow up at:  Follow-up Information     [facility]. Go on 12/25/2023.   Specialty: Behavioral Health Why: You have an appointment for therapy services on 12/25/23 at 8:00 am, in person.   You have an appointment for medication management services on 12/31/23 at 8:40 am, Virtual. Contact information: [address] [place] [address] [phone]                Next level of care provider has access to [facility] Link:no  Safety Planning and Suicide Prevention discussed: No. She declined consent     Has patient been referred to the Quitline?: Patient refused referral for treatment  Patient has been referred for addiction treatment: Patient refused referral for treatment.  Marinda Elk, LCSW 12/10/2023, 10:04 AM

## 2023-12-10 NOTE — Progress Notes (Signed)
   12/10/23 0757  Psych Admission Type (Psych Patients Only)  Admission Status Voluntary  Psychosocial Assessment  Patient Complaints None  Eye Contact Fair  Facial Expression Flat  Affect Depressed  Speech Logical/coherent  Interaction Guarded  Motor Activity Other (Comment) (WDL)  Appearance/Hygiene Unremarkable  Behavior Characteristics Cooperative  Mood Depressed  Thought Process  Coherency WDL  Content WDL  Delusions None reported or observed  Perception WDL  Hallucination None reported or observed  Judgment Poor  Confusion None  Danger to Self  Current suicidal ideation? Denies  Agreement Not to Harm Self Yes  Description of Agreement verbal  Danger to Others  Danger to Others None reported or observed

## 2023-12-10 NOTE — Group Note (Signed)
Recreation Therapy Group Note   Group Topic:Stress Management  Group Date: 12/10/2023 Start Time: 0935 End Time: 1000 Facilitators: Tiernan Suto-McCall, LRT,CTRS Location: [address] Dayroom   Group Topic: Stress Management  Goal Area(s) Addresses:  Patient will identify positive stress management techniques. Patient will identify benefits of using stress management post d/c.  Intervention: Insight Timer App  Activity : Meditation. LRT engaged with patients about meditation and the benefits of it. LRT then played a meditation that focused on getting prepared for the day and being able to speak positive affirmations to themselves when negative thoughts start to creep in. Patients were encouraged to relax, get in a comfortable position, focus on their breathing and be attentive to the meditation.   Education:  Stress Management, Discharge Planning.   Education Outcome: Acknowledges Education   Affect/Mood: N/A   Participation Level: Did not attend    Clinical Observations/Individualized Feedback:      Plan: Continue to engage patient in RT group sessions 2-3x/week.   Rebecca Andrade, LRT,CTRS 12/10/2023 12:04 PM

## 2023-12-17 ENCOUNTER — Telehealth (HOSPITAL_COMMUNITY): Payer: Self-pay

## 2023-12-17 NOTE — Telephone Encounter (Signed)
 Patient was admitted to the hospital and Adderall was discontinued.  She was started her Atarax .  We will not resume Adderall and patient need to be seen in person on her next appointment to discuss her medication.

## 2023-12-17 NOTE — Telephone Encounter (Signed)
 Patient is calling for a refill of her Adderall to be sent to [facility] [address]. Patient has a follow up on 2/24

## 2023-12-19 ENCOUNTER — Ambulatory Visit (HOSPITAL_COMMUNITY): Payer: Medicare HMO | Admitting: Licensed Clinical Social Worker

## 2023-12-21 ENCOUNTER — Other Ambulatory Visit (HOSPITAL_COMMUNITY): Payer: Self-pay | Admitting: Psychiatry

## 2023-12-21 NOTE — Telephone Encounter (Signed)
Called patient and left a HIPAA compliant message for her to call me back

## 2023-12-25 ENCOUNTER — Encounter (HOSPITAL_COMMUNITY): Payer: Self-pay | Admitting: Licensed Clinical Social Worker

## 2023-12-25 ENCOUNTER — Ambulatory Visit (INDEPENDENT_AMBULATORY_CARE_PROVIDER_SITE_OTHER): Payer: Medicare HMO | Admitting: Licensed Clinical Social Worker

## 2023-12-25 DIAGNOSIS — F331 Major depressive disorder, recurrent, moderate: Secondary | ICD-10-CM

## 2023-12-25 DIAGNOSIS — F4010 Social phobia, unspecified: Secondary | ICD-10-CM | POA: Diagnosis not present

## 2023-12-25 NOTE — Progress Notes (Signed)
Virtual Visit via Telephone Note  I connected with Rebecca Andrade on 12/25/23 at  8:00 AM EST by telephone and verified that I am speaking with the correct person using two identifiers.  Location: Patient: home Provider: office   I discussed the limitations, risks, security and privacy concerns of performing an evaluation and management service by telephone and the availability of in person appointments. I also discussed with the patient that there may be a patient responsible charge related to this service. The patient expressed understanding and agreed to proceed.      I discussed the assessment and treatment plan with the patient. The patient was provided an opportunity to ask questions and all were answered. The patient agreed with the plan and demonstrated an understanding of the instructions.   The patient was advised to call back or seek an in-person evaluation if the symptoms worsen or if the condition fails to improve as anticipated.  I provided 50 minutes of non-face-to-face time during this encounter.   Rebecca Melter, LCSW   THERAPIST PROGRESS NOTE  Session Time: 8:05am-8:55am  Participation Level: Active  Behavioral Response: NALethargicAnxious and Depressed  Type of Therapy: Individual Therapy  Treatment Goals addressed: depression, suicidal thoughts   ProgressTowards Goals: Progressing  Interventions: CBT  Summary: Rebecca Andrade is a 53 y.o. female who presents with MDD, recurrent, moderate, social anxiety.   Suicidal/Homicidal: Nowithout intent/plan  Therapist Response: Rebecca Andrade engaged well in phone session. Clinician utilized CBT to process thoughts, feelings, and interactions. Clinician explored experience in hospital following last session. Clinician discussed coping skills and current mood status. Rebecca Andrade shared that she continues to feel depressed and anxious. She shared that she has not showered in 3 days and she does not want to leave her house to  go to the store due to crippling social anxiety. Clinician discussed positive self talk, having supportive people with her, and working towards taking charge of her fight or flight response. Clinician also processed relationship with mother, who has been a verbally abusive alcoholic for most of Rebecca Andrade's life. Clinician identified the importance of setting boundaries and keeping them when mom becomes abusive or rude.   Plan: Return again in 1 weeks. Engage in more frequent sessions in order to build rapport. Start role playing gradual exposure work and positive self talk.  Diagnosis: MDD (major depressive disorder), recurrent episode, moderate (HCC)  Social anxiety disorder  Collaboration of Care: Psychiatrist AEB updated Dr. Lolly Andrade. Rebecca Andrade will see him next week.  Patient/Guardian was advised Release of Information must be obtained prior to any record release in order to collaborate their care with an outside provider. Patient/Guardian was advised if they have not already done so to contact the registration department to sign all necessary forms in order for [place] to release information regarding their care.   Consent: Patient/Guardian gives verbal consent for treatment and assignment of benefits for services provided during this visit. Patient/Guardian expressed understanding and agreed to proceed.   Rebecca Andrade [place], LCSW 12/25/2023

## 2023-12-27 ENCOUNTER — Telehealth (HOSPITAL_COMMUNITY): Payer: Self-pay

## 2023-12-27 NOTE — Telephone Encounter (Signed)
I will discuss with her on next appointment.  Her U tox positive for cocaine.  Cannot give Adderall.

## 2023-12-27 NOTE — Telephone Encounter (Signed)
Patient is calling about her Adderall, I told patient what you said and she is going to reschedule her virtual appt on 2/24 to an in person as soon as you are available. She states that the Adderall really helped with her focus and energy and she would really like it back. Please review and advise, thank you

## 2023-12-28 NOTE — Telephone Encounter (Signed)
Called patient and left a voicemail letting patient know that he will discuss at her next appointment

## 2023-12-31 ENCOUNTER — Telehealth (HOSPITAL_BASED_OUTPATIENT_CLINIC_OR_DEPARTMENT_OTHER): Payer: Medicare HMO | Admitting: Psychiatry

## 2023-12-31 ENCOUNTER — Encounter (HOSPITAL_COMMUNITY): Payer: Self-pay | Admitting: Psychiatry

## 2023-12-31 VITALS — Wt 191.0 lb

## 2023-12-31 DIAGNOSIS — F4010 Social phobia, unspecified: Secondary | ICD-10-CM

## 2023-12-31 DIAGNOSIS — F900 Attention-deficit hyperactivity disorder, predominantly inattentive type: Secondary | ICD-10-CM | POA: Diagnosis not present

## 2023-12-31 DIAGNOSIS — F1490 Cocaine use, unspecified, uncomplicated: Secondary | ICD-10-CM

## 2023-12-31 DIAGNOSIS — F331 Major depressive disorder, recurrent, moderate: Secondary | ICD-10-CM

## 2023-12-31 DIAGNOSIS — F4310 Post-traumatic stress disorder, unspecified: Secondary | ICD-10-CM

## 2023-12-31 MED ORDER — BUSPIRONE HCL 30 MG PO TABS: 30.0000 mg | ORAL_TABLET | Freq: Two times a day (BID) | ORAL | 1 refills | Status: DC

## 2023-12-31 MED ORDER — QUETIAPINE FUMARATE 200 MG PO TABS: 200.0000 mg | ORAL_TABLET | Freq: Every day | ORAL | 1 refills | Status: DC

## 2023-12-31 MED ORDER — VENLAFAXINE HCL ER 75 MG PO CP24: 225.0000 mg | ORAL_CAPSULE | Freq: Every day | ORAL | 1 refills | Status: DC

## 2023-12-31 NOTE — Progress Notes (Signed)
 [facility] MD Virtual Progress Note   Patient Location: Home Provider Location: Home Office  I connect with patient by telephone and verified that I am speaking with correct person by using two identifiers. I discussed the limitations of evaluation and management by telemedicine and the availability of in person appointments. I also discussed with the patient that there may be a patient responsible charge related to this service. The patient expressed understanding and agreed to proceed.  Rebecca Andrade [id number] 53 y.o.  12/31/2023 8:36 AM  History of Present Illness:  Patient is evaluated by phone session.  She did not set up video MyChart and preferred to keep appointment on phone.  Patient was recently admitted to behavioral health after having suicidal thoughts and feeling hopeless and worthless.  Her U tox was positive for cocaine.  Her Adderall was discontinued and her Seroquel was increased, Effexor was increased and Prozac was discontinued.  Her BuSpar remain the same.  Patient wants her Adderall back.  She reported struggle with focus attention.  When asked why she used the cocaine patient did not elaborate well but reported she was thinking about her past and recently mother diagnosed with stage III kidney disease.  Patient told even though she do not get along with her mother but is still loves her and she got very sad and depressed.  She noticed increased Seroquel had helped her sleep but is still struggling with attention and focus.  She told the cocaine was used only once and she will never do it again.  She reported son is doing well.  She denies any hallucination, paranoia or any suicidal thoughts.  She does feel anxious and nervous around people.  She has not seen her primary care Dr. Aleen Campi in a while.  She has a history of PTSD but lately no nightmares or flashback.  She does go outside walk with the dog.  She denies any recent dizziness but realized that she need  to have a brain scan very soon.  She has no tremors shakes or any EPS.  She is no longer taking Prozac.  She reported a lot of financial issues and sometimes she feels very nervous anxious living by herself.  Past Psychiatric History: H/O taking antidepressants since 1990.  H/O multiple inpatient.  Inpatient in 2005, 2014 and last inpatient February 2025.  U tox positive for cocaine.  H/O cocaine use, paranoia, hallucination, mood swing, anger and mania. No h/o suicidal attempt. Tried Zoloft, Paxil, Lexapro, Wellbutrin, lithium, Remeron, Topamax, Abilify, Pristiq, Effexor, amitriptyline, Xanax, temazepam, Valium and Cymbalta.  PCP prscribed Adderall for ADD.      Outpatient Encounter Medications as of 12/31/2023  Medication Sig   atorvastatin (LIPITOR) 20 MG tablet Take 1 tablet (20 mg total) by mouth daily.   busPIRone (BUSPAR) 30 MG tablet Take 1 tablet (30 mg total) by mouth 2 (two) times daily.   Fezolinetant (VEOZAH) 45 MG TABS Take 1 tablet (45 mg total) by mouth daily.   hydrOXYzine (ATARAX) 25 MG tablet Take 1 tablet (25 mg total) by mouth 3 (three) times daily as needed for anxiety.   linaclotide (LINZESS) 290 MCG CAPS capsule Take 1 capsule (290 mcg total) by mouth daily before breakfast.   melatonin 10 MG TABS Take 10 mg by mouth at bedtime.   QUEtiapine (SEROQUEL) 200 MG tablet Take 1 tablet (200 mg total) by mouth at bedtime.   venlafaxine XR (EFFEXOR-XR) 75 MG 24 hr capsule Take 3 capsules (225 mg total)  by mouth daily with breakfast.   No facility-administered encounter medications on file as of 12/31/2023.    Recent Results (from the past 2160 hours)  Comprehensive metabolic panel     Status: Abnormal   Collection Time: 11/13/23  8:57 AM  Result Value Ref Range   Glucose 93 70 - 99 mg/dL   BUN 12 6 - 24 mg/dL   Creatinine, Ser 1.61 0.57 - 1.00 mg/dL   eGFR 92 >09 UE/AVW/0.98   BUN/Creatinine Ratio 16 9 - 23   Sodium 144 134 - 144 mmol/L   Potassium 4.1 3.5 - 5.2 mmol/L    Chloride 107 (H) 96 - 106 mmol/L   CO2 23 20 - 29 mmol/L   Calcium 9.1 8.7 - 10.2 mg/dL   Total Protein 5.7 (L) 6.0 - 8.5 g/dL   Albumin 3.9 3.8 - 4.9 g/dL   Globulin, Total 1.8 1.5 - 4.5 g/dL   Bilirubin Total 0.5 0.0 - 1.2 mg/dL   Alkaline Phosphatase 86 44 - 121 IU/L   AST 12 0 - 40 IU/L   ALT 10 0 - 32 IU/L  VITAMIN D 25 Hydroxy (Vit-D Deficiency, Fractures)     Status: None   Collection Time: 11/13/23  8:57 AM  Result Value Ref Range   Vit D, 25-Hydroxy 35.6 30.0 - 100.0 ng/mL    Comment: Vitamin D deficiency has been defined by the Institute of Medicine and an Endocrine Society practice guideline as a level of serum 25-OH vitamin D less than 20 ng/mL (1,2). The Endocrine Society went on to further define vitamin D insufficiency as a level between 21 and 29 ng/mL (2). 1. IOM (Institute of Medicine). 2010. Dietary reference    intakes for calcium and D. [place]: The    [employer]. 2. Holick MF, Binkley Upton, Bischoff-Ferrari HA, et al.    Evaluation, treatment, and prevention of vitamin D    deficiency: an Endocrine Society clinical practice    guideline. JCEM. 2011 Jul; 96(7):1911-30.   POCT Urine Drug Screen - (I-Screen)     Status: Abnormal   Collection Time: 12/04/23 12:23 PM  Result Value Ref Range   POC Amphetamine UR None Detected NONE DETECTED (Cut Off Level 1000 ng/mL)   POC Secobarbital (BAR) None Detected NONE DETECTED (Cut Off Level 300 ng/mL)   POC Buprenorphine (BUP) None Detected NONE DETECTED (Cut Off Level 10 ng/mL)   POC Oxazepam (BZO) None Detected NONE DETECTED (Cut Off Level 300 ng/mL)   POC Cocaine UR Positive (A) NONE DETECTED (Cut Off Level 300 ng/mL)   POC Methamphetamine UR None Detected NONE DETECTED (Cut Off Level 1000 ng/mL)   POC Morphine None Detected NONE DETECTED (Cut Off Level 300 ng/mL)   POC Methadone UR None Detected NONE DETECTED (Cut Off Level 300 ng/mL)   POC Oxycodone UR None Detected NONE DETECTED (Cut Off Level 100  ng/mL)   POC Marijuana UR None Detected NONE DETECTED (Cut Off Level 50 ng/mL)  Urinalysis, Routine w reflex microscopic -Urine, Clean Catch     Status: None   Collection Time: 12/04/23 12:23 PM  Result Value Ref Range   Color, Urine YELLOW YELLOW   APPearance CLEAR CLEAR   Specific Gravity, Urine 1.019 1.005 - 1.030   pH 6.0 5.0 - 8.0   Glucose, UA NEGATIVE NEGATIVE mg/dL   Hgb urine dipstick NEGATIVE NEGATIVE   Bilirubin Urine NEGATIVE NEGATIVE   Ketones, ur NEGATIVE NEGATIVE mg/dL   Protein, ur NEGATIVE NEGATIVE mg/dL   Nitrite  NEGATIVE NEGATIVE   Leukocytes,Ua NEGATIVE NEGATIVE    Comment: Performed at [facility], [address] [place], [place] [address]  CBC     Status: Abnormal   Collection Time: 12/04/23  2:05 PM  Result Value Ref Range   WBC 13.6 (H) 4.0 - 10.5 K/uL   RBC 4.86 3.87 - 5.11 MIL/uL   Hemoglobin 14.9 12.0 - 15.0 g/dL   HCT 60.4 54.0 - 98.1 %   MCV 91.6 80.0 - 100.0 fL   MCH 30.7 26.0 - 34.0 pg   MCHC 33.5 30.0 - 36.0 g/dL   RDW 19.1 47.8 - 29.5 %   Platelets 286 150 - 400 K/uL   nRBC 0.0 0.0 - 0.2 %    Comment: Performed at [facility], [address], [place], [place] [address]  Comprehensive metabolic panel     Status: Abnormal   Collection Time: 12/04/23  2:05 PM  Result Value Ref Range   Sodium 140 135 - 145 mmol/L   Potassium 3.6 3.5 - 5.1 mmol/L   Chloride 107 98 - 111 mmol/L   CO2 24 22 - 32 mmol/L   Glucose, Bld 92 70 - 99 mg/dL    Comment: Glucose reference range applies only to samples taken after fasting for at least 8 hours.   BUN 18 6 - 20 mg/dL   Creatinine, Ser 8.65 0.44 - 1.00 mg/dL   Calcium 9.2 8.9 - 78.4 mg/dL   Total Protein 6.0 (L) 6.5 - 8.1 g/dL   Albumin 3.3 (L) 3.5 - 5.0 g/dL   AST 16 15 - 41 U/L   ALT 16 0 - 44 U/L   Alkaline Phosphatase 64 38 - 126 U/L   Total Bilirubin 0.7 0.0 - 1.2 mg/dL   GFR, Estimated >69 >62 mL/min    Comment: (NOTE) Calculated using the CKD-EPI Creatinine Equation (2021)     Anion gap 9 5 - 15    Comment: Performed at [facility], [address], [place], [place] [address]  Hemoglobin A1c     Status: None   Collection Time: 12/04/23  2:05 PM  Result Value Ref Range   Hgb A1c MFr Bld 5.1 4.8 - 5.6 %    Comment: (NOTE) Pre diabetes:          5.7%-6.4%  Diabetes:              >6.4%  Glycemic control for   <7.0% adults with diabetes    Mean Plasma Glucose 99.67 mg/dL    Comment: Performed at [facility], [address], [place], [place] [address]  Lipid panel     Status: None   Collection Time: 12/04/23  2:05 PM  Result Value Ref Range   Cholesterol 184 0 - 200 mg/dL   Triglycerides 010 <272 mg/dL   HDL 66 >53 mg/dL   Total CHOL/HDL Ratio 2.8 RATIO   VLDL 22 0 - 40 mg/dL   LDL Cholesterol 96 0 - 99 mg/dL    Comment:        Total Cholesterol/HDL:CHD Risk Coronary Heart Disease Risk Table                     Men   Women  1/2 Average Risk   3.4   3.3  Average Risk       5.0   4.4  2 X Average Risk   9.6   7.1  3 X Average Risk  23.4   11.0        Use the calculated Patient Ratio above and the CHD Risk Table to determine the patient's CHD Risk.        ATP III CLASSIFICATION (LDL):  <100     mg/dL   Optimal  409-811  mg/dL   Near or Above                    Optimal  130-159  mg/dL   Borderline  914-782  mg/dL   High  >956     mg/dL   Very High Performed at [facility], [address] [place], [place] [address]   TSH     Status: None   Collection Time: 12/04/23  2:07 PM  Result Value Ref Range   TSH 0.982 0.350 - 4.500 uIU/mL    Comment: Performed by a 3rd Generation assay with a functional sensitivity of <=0.01 uIU/mL. Performed at [facility], [address], [place], [place] [address]      Psychiatric Specialty Exam: Physical Exam  Review of Systems  Psychiatric/Behavioral:  Positive for decreased concentration and dysphoric mood. The patient is nervous/anxious.     Weight 191 lb (86.6 kg), last  menstrual period 09/02/2018.There is no height or weight on file to calculate BMI.  General Appearance: NA  Eye Contact:  NA  Speech:  Slow  Volume:  Decreased  Mood:  Anxious and Dysphoric  Affect:  NA  Thought Process:  Descriptions of Associations: Intact  Orientation:  Full (Time, Place, and Person)  Thought Content:  Rumination  Suicidal Thoughts:  No  Homicidal Thoughts:  No  Memory:  Immediate;   Fair Recent;   Fair Remote;   Fair  Judgement:  Fair  Insight:  Shallow  Psychomotor Activity:  Decreased  Concentration:  Concentration: Fair and Attention Span: Fair  Recall:  [employer] of Knowledge:  Fair  Language:  Fair  Akathisia:  No  Handed:  Right  AIMS (if indicated):     Assets:  Communication Skills Desire for Improvement Housing  ADL's:  Intact  Cognition:  WNL  Sleep: Better     Assessment/Plan: MDD (major depressive disorder), recurrent episode, moderate (HCC) - Plan: QUEtiapine (SEROQUEL) 200 MG tablet, venlafaxine XR (EFFEXOR-XR) 75 MG 24 hr capsule  Social anxiety disorder - Plan: QUEtiapine (SEROQUEL) 200 MG tablet, venlafaxine XR (EFFEXOR-XR) 75 MG 24 hr capsule, busPIRone (BUSPAR) 30 MG tablet  PTSD (post-traumatic stress disorder) - Plan: QUEtiapine (SEROQUEL) 200 MG tablet, venlafaxine XR (EFFEXOR-XR) 75 MG 24 hr capsule, busPIRone (BUSPAR) 30 MG tablet  Cocaine use, uncomplicated - Plan: QUEtiapine (SEROQUEL) 200 MG tablet, venlafaxine XR (EFFEXOR-XR) 75 MG 24 hr capsule  Attention deficit hyperactivity disorder (ADHD), predominantly inattentive type  I reviewed recent hospitalization and blood work results.  U tox positive for cocaine.  Medicines were changed.  She is not taking higher dose of Seroquel 200 mg at bedtime, venlafaxine to 25 mg daily.  Her Adderall and Prozac discontinued.  She remained on BuSpar 30 mg daily.  I had a long discussion in the past about polypharmacy.  Patient wants to go back on Adderall.  I explained given the history  of cocaine in the past and recent use it will be not prescribed but will consider nonstimulant in the future.  We also emphasize getting in person visit and regular urine toxicology.  Patient agreed for next appointment in person.  Labs reviewed, hemoglobin A1c 5.1.  She has no tremor or shakes or any EPS.  I encourage need to see a therapist and see primary care for physical.  I also reminded she need to get brain scan as history of dizziness.  Follow-up in 4 to 6 weeks in person.  Encouraged to call us back if he feels worsening of symptoms or having any suicidal thoughts or homicidal thoughts and she need to call 911 or go to local emergency room.   Follow Up Instructions:     I discussed the assessment and treatment plan with the patient. The patient was provided an opportunity to ask questions and all were answered. The patient agreed with the plan and demonstrated an understanding of the instructions.   The patient was advised to call back or seek an in-person evaluation if the symptoms worsen or if the condition fails to improve as anticipated.    Collaboration of Care: Other provider involved in patient's care AEB notes are available in epic to review  Patient/Guardian was advised Release of Information must be obtained prior to any record release in order to collaborate their care with an outside provider. Patient/Guardian was advised if they have not already done so to contact the registration department to sign all necessary forms in order for [place] to release information regarding their care.   Consent: Patient/Guardian gives verbal consent for treatment and assignment of benefits for services provided during this visit. Patient/Guardian expressed understanding and agreed to proceed.     I provided 26 minutes of non face to face time during this encounter.  Note: This document was prepared by [employer] voice dictation technology and any errors that results from this process are  unintentional.    Cleotis Nipper, MD 12/31/2023

## 2024-01-02 ENCOUNTER — Encounter (HOSPITAL_COMMUNITY): Payer: Self-pay | Admitting: Licensed Clinical Social Worker

## 2024-01-02 ENCOUNTER — Ambulatory Visit (INDEPENDENT_AMBULATORY_CARE_PROVIDER_SITE_OTHER): Payer: Medicare HMO | Admitting: Licensed Clinical Social Worker

## 2024-01-02 DIAGNOSIS — F331 Major depressive disorder, recurrent, moderate: Secondary | ICD-10-CM | POA: Diagnosis not present

## 2024-01-02 DIAGNOSIS — F4010 Social phobia, unspecified: Secondary | ICD-10-CM | POA: Diagnosis not present
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
## 2024-01-08 ENCOUNTER — Other Ambulatory Visit (HOSPITAL_COMMUNITY): Payer: Self-pay | Admitting: Psychiatry

## 2024-01-08 DIAGNOSIS — F4010 Social phobia, unspecified: Secondary | ICD-10-CM

## 2024-01-12 ENCOUNTER — Other Ambulatory Visit (HOSPITAL_COMMUNITY): Payer: Self-pay | Admitting: Psychiatry

## 2024-01-12 DIAGNOSIS — F4010 Social phobia, unspecified: Secondary | ICD-10-CM

## 2024-01-12 DIAGNOSIS — F332 Major depressive disorder, recurrent severe without psychotic features: Secondary | ICD-10-CM

## 2024-01-14 ENCOUNTER — Encounter: Payer: Medicare HMO | Admitting: Medical

## 2024-01-14 ENCOUNTER — Telehealth (HOSPITAL_COMMUNITY): Payer: Medicare HMO | Admitting: Psychiatry

## 2024-01-14 ENCOUNTER — Telehealth: Payer: Self-pay | Admitting: Medical

## 2024-01-14 ENCOUNTER — Ambulatory Visit: Admitting: Medical

## 2024-01-14 VITALS — BP 110/80 | HR 108 | Wt 191.4 lb

## 2024-01-14 DIAGNOSIS — E559 Vitamin D deficiency, unspecified: Secondary | ICD-10-CM

## 2024-01-14 DIAGNOSIS — I700 Atherosclerosis of aorta: Secondary | ICD-10-CM | POA: Diagnosis not present

## 2024-01-14 DIAGNOSIS — E785 Hyperlipidemia, unspecified: Secondary | ICD-10-CM | POA: Diagnosis not present

## 2024-01-14 DIAGNOSIS — F4010 Social phobia, unspecified: Secondary | ICD-10-CM | POA: Diagnosis not present

## 2024-01-14 DIAGNOSIS — F332 Major depressive disorder, recurrent severe without psychotic features: Secondary | ICD-10-CM | POA: Diagnosis not present

## 2024-01-14 DIAGNOSIS — Z780 Asymptomatic menopausal state: Secondary | ICD-10-CM

## 2024-01-14 NOTE — Telephone Encounter (Signed)
 Sent in Error per shane
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
## 2024-01-15 ENCOUNTER — Ambulatory Visit (HOSPITAL_COMMUNITY): Payer: Medicare HMO | Admitting: Licensed Clinical Social Worker

## 2024-01-23 ENCOUNTER — Telehealth (HOSPITAL_COMMUNITY): Payer: Self-pay

## 2024-01-23 NOTE — Telephone Encounter (Signed)
 She should keep her current appointment and will try to see in person.

## 2024-01-23 NOTE — Telephone Encounter (Signed)
 Patient was scheduled with you for tomorrow but had to reschedule due to her uncles funeral. Patient really wanted to see you in person because she wants to talk about her medication but she was unable to get an in person appointment until May. She currently has a Video visit scheduled this month - patient says she needs in person and she needs before May. Please review and advise, thank you

## 2024-01-24 ENCOUNTER — Ambulatory Visit (HOSPITAL_COMMUNITY): Payer: Medicare HMO | Admitting: Psychiatry

## 2024-01-29 ENCOUNTER — Other Ambulatory Visit (HOSPITAL_COMMUNITY): Payer: Self-pay | Admitting: Psychiatry

## 2024-01-29 ENCOUNTER — Telehealth (HOSPITAL_COMMUNITY): Payer: Self-pay

## 2024-01-29 DIAGNOSIS — F331 Major depressive disorder, recurrent, moderate: Secondary | ICD-10-CM

## 2024-01-29 DIAGNOSIS — F4010 Social phobia, unspecified: Secondary | ICD-10-CM

## 2024-01-29 DIAGNOSIS — F1490 Cocaine use, unspecified, uncomplicated: Secondary | ICD-10-CM

## 2024-01-29 DIAGNOSIS — F4310 Post-traumatic stress disorder, unspecified: Secondary | ICD-10-CM

## 2024-01-29 NOTE — Telephone Encounter (Signed)
 She is positive for cocaine.  I will not prescribe stimulant.  I am open to try Strattera or any nonstimulant.  She still need to do UDS.

## 2024-01-29 NOTE — Telephone Encounter (Signed)
 I spoke to the patient and advised her that Dr. Lolly Mustache is not going to prescribe the Adderall. Patient is adamant that it is the only thing that helps her. Patient would like a sooner in person appt., she is currently scheduled for May. I told patient to call the front desk and ask to be put on the cancellation list. Patient voiced her understanding and said that she would call.

## 2024-01-29 NOTE — Telephone Encounter (Signed)
 Patient has called several times asking to be seen earlier. I have explained that you are booked out and that she is on the cancellation list. She wants to come in earlier because she wants her Adderall back. Patient said that you told her if she came in for regular UDS that you would prescribe. Please review and advise, thank you

## 2024-01-30 ENCOUNTER — Encounter (HOSPITAL_COMMUNITY): Payer: Self-pay | Admitting: Licensed Clinical Social Worker

## 2024-01-30 ENCOUNTER — Ambulatory Visit (HOSPITAL_COMMUNITY): Admitting: Licensed Clinical Social Worker

## 2024-01-30 ENCOUNTER — Telehealth (HOSPITAL_COMMUNITY): Payer: Self-pay

## 2024-01-30 DIAGNOSIS — F332 Major depressive disorder, recurrent severe without psychotic features: Secondary | ICD-10-CM

## 2024-01-30 DIAGNOSIS — F4010 Social phobia, unspecified: Secondary | ICD-10-CM

## 2024-01-30 MED ORDER — HYDROXYZINE HCL 25 MG PO TABS
25.0000 mg | ORAL_TABLET | Freq: Two times a day (BID) | ORAL | 0 refills | Status: DC | PRN
Start: 2024-01-30 — End: 2024-04-03

## 2024-01-30 NOTE — Telephone Encounter (Signed)
 Prescription sent to [facility].

## 2024-01-30 NOTE — Progress Notes (Signed)
 Virtual Visit via Video Note  I connected with Rebecca Andrade on 01/30/24 at  9:00 AM EDT by a video enabled telemedicine application and verified that I am speaking with the correct person using two identifiers.  Location: Patient: home Provider: home office   I discussed the limitations of evaluation and management by telemedicine and the availability of in person appointments. The patient expressed understanding and agreed to proceed.   I discussed the assessment and treatment plan with the patient. The patient was provided an opportunity to ask questions and all were answered. The patient agreed with the plan and demonstrated an understanding of the instructions.   The patient was advised to call back or seek an in-person evaluation if the symptoms worsen or if the condition fails to improve as anticipated.  I provided 55 minutes of non-face-to-face time during this encounter.   Rebecca Melter, LCSW   THERAPIST PROGRESS NOTE  Session Time: 9:00am-9:55am  Participation Level: Active  Behavioral Response: DisheveledLethargicDepressed  Type of Therapy: Individual Therapy  Treatment Goals addressed: depression, suicidal thoughts   ProgressTowards Goals: Not Progressing  Interventions: CBT  Summary: Rebecca Andrade is a 53 y.o. female who presents with MDD, recurrent, severe.   Suicidal/Homicidal: Yeswithout intent/plan  Therapist Response: Rebecca Andrade struggle to engage in individual virtual session with clinician.  Clinician utilized CBT to explore triggers to current depression, thoughts, and feelings.  Rebecca Andrade identified that this feeling has been consistent for many weeks.  She shared sleep disruption, tearfulness, lethargy, and agoraphobia.  Clinician explored levels of anxiety, especially when going out into the community.  Rebecca Andrade shared she often avoids going out due to crying spells and worries about other people's perceptions of her.  Clinician explored self-care and  coping strategies clinician also explored daily routine.  Rebecca Andrade shared that she does not have a very good daily routine due to unstable sleep patterns clinician explored use of medication for sleep Rebecca Andrade shared that she takes her medications but they do not always work.  Clinician discussed today's plan and encouraged Rebecca Andrade to take a shower, eat food, and spend at least 10 to 15 minutes outside in the sun.  Clinician briefly addressed grief over the recent loss of Rebecca Andrade.  Rebecca Andrade shared this has increased some suicidal thoughts.  However she has no current plan.  Plan: Return again in 2 weeks.  Diagnosis: Major depressive disorder, recurrent, severe w/o psychotic behavior [facility])  Collaboration of Care: Psychiatrist AEB Rebecca Andrade will see Dr. Lolly Mustache online this Friday.  She shared that she would like to be seen in person more frequently for both medication management and therapy.  Patient/Guardian was advised Release of Information must be obtained prior to any record release in order to collaborate their care with an outside provider. Patient/Guardian was advised if they have not already done so to contact the registration department to sign all necessary forms in order for [place] to release information regarding their care.   Consent: Patient/Guardian gives verbal consent for treatment and assignment of benefits for services provided during this visit. Patient/Guardian expressed understanding and agreed to proceed.   Rebecca Heck [place], LCSW 01/30/2024

## 2024-01-30 NOTE — Telephone Encounter (Signed)
 Patient is asking for a refill on Hydroxyzine. Last filled by Starleen Blue, NP on 12/10/2023. Please review and advise, thank you

## 2024-01-31 ENCOUNTER — Encounter: Payer: Self-pay | Admitting: Anesthesiology

## 2024-01-31 ENCOUNTER — Telehealth: Payer: Self-pay | Admitting: Neurology

## 2024-01-31 NOTE — Telephone Encounter (Signed)
 Call to patient who reports more dizziness than normal and they are not associated with migraines. She is taking the meclizine up 2 times daily every other and then reports she takes whenever she needs. I advised because we treat migraines and this is new symptom not associated with migraine she may need to see PCP for new referral. Patient began questioning why and I attempted to explain. Advised I would check with Maralyn Sago and reach back out. She verbalized understanding of protocol.

## 2024-01-31 NOTE — Telephone Encounter (Signed)
 Pt said, last week started having dizziness to where the room is spinning, makes nauseous.  Takes medication for nausea. Would like a call from the nurse to discuss a sooner appt.

## 2024-01-31 NOTE — Telephone Encounter (Signed)
 Spoke to pt and stated :  Follow up PCP or urgent care.

## 2024-02-01 ENCOUNTER — Encounter (HOSPITAL_COMMUNITY): Payer: Self-pay | Admitting: Psychiatry

## 2024-02-01 ENCOUNTER — Telehealth (HOSPITAL_COMMUNITY): Admitting: Psychiatry

## 2024-02-01 ENCOUNTER — Ambulatory Visit: Payer: Self-pay

## 2024-02-01 VITALS — Wt 191.0 lb

## 2024-02-01 DIAGNOSIS — F4310 Post-traumatic stress disorder, unspecified: Secondary | ICD-10-CM

## 2024-02-01 DIAGNOSIS — F4010 Social phobia, unspecified: Secondary | ICD-10-CM | POA: Diagnosis not present

## 2024-02-01 DIAGNOSIS — F331 Major depressive disorder, recurrent, moderate: Secondary | ICD-10-CM

## 2024-02-01 DIAGNOSIS — F1490 Cocaine use, unspecified, uncomplicated: Secondary | ICD-10-CM | POA: Diagnosis not present

## 2024-02-01 MED ORDER — BUPROPION HCL ER (XL) 150 MG PO TB24: 150.0000 mg | ORAL_TABLET | Freq: Every day | ORAL | 0 refills | Status: DC

## 2024-02-01 MED ORDER — QUETIAPINE FUMARATE 200 MG PO TABS: 200.0000 mg | ORAL_TABLET | Freq: Every day | ORAL | 0 refills | Status: DC

## 2024-02-01 MED ORDER — BUSPIRONE HCL 30 MG PO TABS: 30.0000 mg | ORAL_TABLET | Freq: Two times a day (BID) | ORAL | 0 refills | Status: DC

## 2024-02-01 MED ORDER — VENLAFAXINE HCL ER 75 MG PO CP24: 225.0000 mg | ORAL_CAPSULE | Freq: Every day | ORAL | 0 refills | Status: DC

## 2024-02-01 NOTE — Telephone Encounter (Signed)
 Copied from CRM [id number]. Topic: Clinical - Red Word Triage >> Feb 01, 2024  8:04 AM Gildardo Pounds wrote: Red Word that prompted transfer to Nurse Triage: Patient has been dizzy, laying down, rolling over and getting up everyday. Getting worse. Callback number [phone]  Chief Complaint: dizzy Symptoms: dizzy with position changes Frequency: comes and goes Pertinent Negatives: Patient denies vomiting, numbness, tingling, cold like symptoms Disposition: [] ED /[] Urgent Care (no appt availability in office) / [x] Appointment(In office/virtual)/ []  [facility]/ [] Home Care/ [] Refused Recommended Disposition /[] [facility]/ []  Follow-up with PCP Additional Notes: per protocol apt made for Monday and per patient request; care advice given, denies questions; instructed to go to ER if becomes worse.   Reason for Disposition  [1] MODERATE dizziness (e.g., interferes with normal activities) AND [2] has NOT been evaluated by doctor (or NP/PA) for this  (Exception: Dizziness caused by heat exposure, sudden standing, or poor fluid intake.)  Answer Assessment - Initial Assessment Questions 1. DESCRIPTION: "Describe your dizziness."     When she is in the bed turning she gets dizzy; room is tilting 2. LIGHTHEADED: "Do you feel lightheaded?" (e.g., somewhat faint, woozy, weak upon standing)     Yes woozy and week 3. VERTIGO: "Do you feel like either you or the room is spinning or tilting?" (i.e. vertigo)     Tilting  4. SEVERITY: "How bad is it?"  "Do you feel like you are going to faint?" "Can you stand and walk?"   - MILD: Feels slightly dizzy, but walking normally.   - MODERATE: Feels unsteady when walking, but not falling; interferes with normal activities (e.g., school, work).   - SEVERE: Unable to walk without falling, or requires assistance to walk without falling; feels like passing out now.      severe 5. ONSET:  "When did the dizziness begin?"     Hx dizzy spells; about a week  ago  6. AGGRAVATING FACTORS: "Does anything make it worse?" (e.g., standing, change in head position)     Standing or moving  7. HEART RATE: "Can you tell me your heart rate?" "How many beats in 15 seconds?"  (Note: not all patients can do this)       na 8. CAUSE: "What do you think is causing the dizziness?"     Hx dizzyness  9. RECURRENT SYMPTOM: "Have you had dizziness before?" If Yes, ask: "When was the last time?" "What happened that time?"     Yes,  10. OTHER SYMPTOMS: "Do you have any other symptoms?" (e.g., fever, chest pain, vomiting, diarrhea, bleeding)       denies 11. PREGNANCY: "Is there any chance you are pregnant?" "When was your last menstrual period?"       na  Protocols used: Dizziness - Lightheadedness-A-AH

## 2024-02-01 NOTE — Progress Notes (Signed)
 [facility] MD Virtual Progress Note   Patient Location: Home Office Provider Location: Home Office  I connect with patient by telephone and verified that I am speaking with correct person by using two identifiers. I discussed the limitations of evaluation and management by telemedicine and the availability of in person appointments. I also discussed with the patient that there may be a patient responsible charge related to this service. The patient expressed understanding and agreed to proceed.  Rebecca Andrade [id number] 53 y.o.  02/01/2024 8:46 AM  History of Present Illness:  Patient is evaluated by phone session.  She missed her last in person visit because her 54 year old girl died and she had to attend the funeral.  Patient told she is not doing very well and struggle with chronic depression, anxiety, fatigue and lack of motivation.  She is very anxious about her general health and her mother will diagnosed with a stage III kidney disease.  Her past few days she is complaining of dizziness.  Her PCP prescribed meclizine but she has not seen any improvement and now she has appointment to see her primary care on Monday.  She denies any suicidal thoughts or homicidal thoughts but reported at times hopeless, fatigue, unmotivated.  She clean not using drugs especially cocaine since the last visit in January.  She regret using cocaine because Adderall was discontinued in the hospital.  She like to go back on a stimulant however explained about the hospital policy and drug use.  Her sleep is fair and sometimes she has nightmares and flashback.  She is very anxious around people and does not leave the house unless it is important.  Her appetite is fair.  She reported lately dizziness is so bad that she has to hold things to help the balance.  She supposed to get brain scan but had not done recently.  She has no tremor or shakes or any EPS.  She like to come in person and okay to be tested  for drug screen so she can go back on Adderall.  Past Psychiatric History: H/O taking antidepressants since 1990.  H/O multiple inpatient.  Inpatient in 2005, 2014 and last inpatient February 2025.  U tox positive for cocaine.  H/O cocaine use, paranoia, hallucination, mood swing, anger and mania. No h/o suicidal attempt. Tried Zoloft, Paxil, Lexapro, Wellbutrin, lithium, Remeron, Topamax, Abilify, Pristiq, Effexor, amitriptyline, Xanax, temazepam, Valium and Cymbalta.  PCP prscribed Adderall for ADD.      Outpatient Encounter Medications as of 02/01/2024  Medication Sig   atorvastatin (LIPITOR) 20 MG tablet Take 1 tablet (20 mg total) by mouth daily.   busPIRone (BUSPAR) 30 MG tablet Take 1 tablet (30 mg total) by mouth 2 (two) times daily.   DICLOFENAC PO Take by mouth.   Fezolinetant (VEOZAH) 45 MG TABS Take 1 tablet (45 mg total) by mouth daily.   hydrOXYzine (ATARAX) 25 MG tablet Take 1 tablet (25 mg total) by mouth 2 (two) times daily as needed for anxiety.   linaclotide (LINZESS) 290 MCG CAPS capsule Take 1 capsule (290 mcg total) by mouth daily before breakfast.   meclizine (ANTIVERT) 25 MG tablet Take 25 mg by mouth 2 (two) times daily as needed.   QUEtiapine (SEROQUEL) 200 MG tablet Take 1 tablet (200 mg total) by mouth at bedtime.   venlafaxine XR (EFFEXOR-XR) 75 MG 24 hr capsule Take 3 capsules (225 mg total) by mouth daily with breakfast.   No facility-administered encounter medications on file as  of 02/01/2024.    Recent Results (from the past 2160 hours)  Comprehensive metabolic panel     Status: Abnormal   Collection Time: 11/13/23  8:57 AM  Result Value Ref Range   Glucose 93 70 - 99 mg/dL   BUN 12 6 - 24 mg/dL   Creatinine, Ser 1.61 0.57 - 1.00 mg/dL   eGFR 92 >09 UE/AVW/0.98   BUN/Creatinine Ratio 16 9 - 23   Sodium 144 134 - 144 mmol/L   Potassium 4.1 3.5 - 5.2 mmol/L   Chloride 107 (H) 96 - 106 mmol/L   CO2 23 20 - 29 mmol/L   Calcium 9.1 8.7 - 10.2 mg/dL   Total  Protein 5.7 (L) 6.0 - 8.5 g/dL   Albumin 3.9 3.8 - 4.9 g/dL   Globulin, Total 1.8 1.5 - 4.5 g/dL   Bilirubin Total 0.5 0.0 - 1.2 mg/dL   Alkaline Phosphatase 86 44 - 121 IU/L   AST 12 0 - 40 IU/L   ALT 10 0 - 32 IU/L  VITAMIN D 25 Hydroxy (Vit-D Deficiency, Fractures)     Status: None   Collection Time: 11/13/23  8:57 AM  Result Value Ref Range   Vit D, 25-Hydroxy 35.6 30.0 - 100.0 ng/mL    Comment: Vitamin D deficiency has been defined by the Institute of Medicine and an Endocrine Society practice guideline as a level of serum 25-OH vitamin D less than 20 ng/mL (1,2). The Endocrine Society went on to further define vitamin D insufficiency as a level between 21 and 29 ng/mL (2). 1. IOM (Institute of Medicine). 2010. Dietary reference    intakes for calcium and D. [place]: The    [employer]. 2. Holick MF, Binkley Pland[place], Bischoff-Ferrari HA, et al.    Evaluation, treatment, and prevention of vitamin D    deficiency: an Endocrine Society clinical practice    guideline. JCEM. 2011 Jul; 96(7):1911-30.   POCT Urine Drug Screen - (I-Screen)     Status: Abnormal   Collection Time: 12/04/23 12:23 PM  Result Value Ref Range   POC Amphetamine UR None Detected NONE DETECTED (Cut Off Level 1000 ng/mL)   POC Secobarbital (BAR) None Detected NONE DETECTED (Cut Off Level 300 ng/mL)   POC Buprenorphine (BUP) None Detected NONE DETECTED (Cut Off Level 10 ng/mL)   POC Oxazepam (BZO) None Detected NONE DETECTED (Cut Off Level 300 ng/mL)   POC Cocaine UR Positive (A) NONE DETECTED (Cut Off Level 300 ng/mL)   POC Methamphetamine UR None Detected NONE DETECTED (Cut Off Level 1000 ng/mL)   POC Morphine None Detected NONE DETECTED (Cut Off Level 300 ng/mL)   POC Methadone UR None Detected NONE DETECTED (Cut Off Level 300 ng/mL)   POC Oxycodone UR None Detected NONE DETECTED (Cut Off Level 100 ng/mL)   POC Marijuana UR None Detected NONE DETECTED (Cut Off Level 50 ng/mL)  Urinalysis,  Routine w reflex microscopic -Urine, Clean Catch     Status: None   Collection Time: 12/04/23 12:23 PM  Result Value Ref Range   Color, Urine YELLOW YELLOW   APPearance CLEAR CLEAR   Specific Gravity, Urine 1.019 1.005 - 1.030   pH 6.0 5.0 - 8.0   Glucose, UA NEGATIVE NEGATIVE mg/dL   Hgb urine dipstick NEGATIVE NEGATIVE   Bilirubin Urine NEGATIVE NEGATIVE   Ketones, ur NEGATIVE NEGATIVE mg/dL   Protein, ur NEGATIVE NEGATIVE mg/dL   Nitrite NEGATIVE NEGATIVE   Leukocytes,Ua NEGATIVE NEGATIVE    Comment: Performed at [facility]  [facility], [address], [place], [place] [address]  CBC     Status: Abnormal   Collection Time: 12/04/23  2:05 PM  Result Value Ref Range   WBC 13.6 (H) 4.0 - 10.5 K/uL   RBC 4.86 3.87 - 5.11 MIL/uL   Hemoglobin 14.9 12.0 - 15.0 g/dL   HCT 19.1 47.8 - 29.5 %   MCV 91.6 80.0 - 100.0 fL   MCH 30.7 26.0 - 34.0 pg   MCHC 33.5 30.0 - 36.0 g/dL   RDW 62.1 30.8 - 65.7 %   Platelets 286 150 - 400 K/uL   nRBC 0.0 0.0 - 0.2 %    Comment: Performed at [facility], [address], [place], [place] [address]  Comprehensive metabolic panel     Status: Abnormal   Collection Time: 12/04/23  2:05 PM  Result Value Ref Range   Sodium 140 135 - 145 mmol/L   Potassium 3.6 3.5 - 5.1 mmol/L   Chloride 107 98 - 111 mmol/L   CO2 24 22 - 32 mmol/L   Glucose, Bld 92 70 - 99 mg/dL    Comment: Glucose reference range applies only to samples taken after fasting for at least 8 hours.   BUN 18 6 - 20 mg/dL   Creatinine, Ser 2.95 0.44 - 1.00 mg/dL   Calcium 9.2 8.9 - 28.4 mg/dL   Total Protein 6.0 (L) 6.5 - 8.1 g/dL   Albumin 3.3 (L) 3.5 - 5.0 g/dL   AST 16 15 - 41 U/L   ALT 16 0 - 44 U/L   Alkaline Phosphatase 64 38 - 126 U/L   Total Bilirubin 0.7 0.0 - 1.2 mg/dL   GFR, Estimated >13 >24 mL/min    Comment: (NOTE) Calculated using the CKD-EPI Creatinine Equation (2021)    Anion gap 9 5 - 15    Comment: Performed at [facility], [address]  [place], [place] [address]  Hemoglobin A1c     Status: None   Collection Time: 12/04/23  2:05 PM  Result Value Ref Range   Hgb A1c MFr Bld 5.1 4.8 - 5.6 %    Comment: (NOTE) Pre diabetes:          5.7%-6.4%  Diabetes:              >6.4%  Glycemic control for   <7.0% adults with diabetes    Mean Plasma Glucose 99.67 mg/dL    Comment: Performed at [facility], [address], [place], [place] [address]  Lipid panel     Status: None   Collection Time: 12/04/23  2:05 PM  Result Value Ref Range   Cholesterol 184 0 - 200 mg/dL   Triglycerides 644 <034 mg/dL   HDL 66 >74 mg/dL   Total CHOL/HDL Ratio 2.8 RATIO   VLDL 22 0 - 40 mg/dL   LDL Cholesterol 96 0 - 99 mg/dL    Comment:        Total Cholesterol/HDL:CHD Risk Coronary Heart Disease Risk Table                     Men   Women  1/2 Average Risk   3.4   3.3  Average Risk       5.0   4.4  2 X Average Risk   9.6   7.1  3 X Average Risk  23.4   11.0        Use the calculated  Patient Ratio above and the CHD Risk Table to determine the patient's CHD Risk.        ATP III CLASSIFICATION (LDL):  <100     mg/dL   Optimal  161-096  mg/dL   Near or Above                    Optimal  130-159  mg/dL   Borderline  045-409  mg/dL   High  >811     mg/dL   Very High Performed at [facility], [address], [place], [place] [address]   TSH     Status: None   Collection Time: 12/04/23  2:07 PM  Result Value Ref Range   TSH 0.982 0.350 - 4.500 uIU/mL    Comment: Performed by a 3rd Generation assay with a functional sensitivity of <=0.01 uIU/mL. Performed at [facility], [address], [place], [place] [address]      Psychiatric Specialty Exam: Physical Exam  Review of Systems  Neurological:  Positive for dizziness.  Psychiatric/Behavioral:  Positive for decreased concentration, dysphoric mood and sleep disturbance.     Last menstrual period 09/02/2018.There is no height or weight on file to calculate BMI.  General  Appearance: NA  Eye Contact:  NA  Speech:  Slow  Volume:  Decreased  Mood:  Anxious and Dysphoric  Affect:  NA  Thought Process:  Descriptions of Associations: Intact  Orientation:  Full (Time, Place, and Person)  Thought Content:  Rumination  Suicidal Thoughts:  No  Homicidal Thoughts:  No  Memory:  Immediate;   Fair Recent;   Fair Remote;   Fair  Judgement:  Fair  Insight:  Shallow  Psychomotor Activity:  Decreased  Concentration:  Concentration: Fair and Attention Span: Fair  Recall:  [employer] of Knowledge:  Fair  Language:  Good  Akathisia:  No  Handed:  Right  AIMS (if indicated):     Assets:  Communication Skills Desire for Improvement Housing Transportation  ADL's:  Intact  Cognition:  WNL  Sleep:  fair     Assessment/Plan: MDD (major depressive disorder), recurrent episode, moderate (HCC) - Plan: QUEtiapine (SEROQUEL) 200 MG tablet, venlafaxine XR (EFFEXOR-XR) 75 MG 24 hr capsule, buPROPion (WELLBUTRIN XL) 150 MG 24 hr tablet  Social anxiety disorder - Plan: busPIRone (BUSPAR) 30 MG tablet, QUEtiapine (SEROQUEL) 200 MG tablet, venlafaxine XR (EFFEXOR-XR) 75 MG 24 hr capsule  PTSD (post-traumatic stress disorder) - Plan: busPIRone (BUSPAR) 30 MG tablet, QUEtiapine (SEROQUEL) 200 MG tablet, venlafaxine XR (EFFEXOR-XR) 75 MG 24 hr capsule  Cocaine use, uncomplicated - Plan: QUEtiapine (SEROQUEL) 200 MG tablet, venlafaxine XR (EFFEXOR-XR) 75 MG 24 hr capsule  I review notes and telephone messages.  Once again I explained cannot prescribe stimulants because of history of cocaine abuse.  The patient claimed that she has been sober but we need to monitor closely.  I recommend considering to restart Wellbutrin which had helped her in the past.  The patient not happy but willing to consider.  I also recommend to discontinue hydroxyzine as patient complaining of dizziness.  Though she does not feel it is related to hydroxyzine but discussed polypharmacy and avoid any more  sedations.  She has appointment coming up to see primary care.  She is not sure if she has vertigo but sometimes she feels her head is spinning.  She is post to get brain scan but not show went.  I will continue Seroquel 200 mg  at bedtime, venlafaxine 225 mg daily, BuSpar 30 mg twice a day and will start Wellbutrin XL 150 mg daily.  Encouraged to continue therapy with Shanda Bumps.  We may need to cut down the BuSpar if the Wellbutrin helps.  She like to have in person visit in the future.  Follow-up in 4 weeks.  Follow Up Instructions:     I discussed the assessment and treatment plan with the patient. The patient was provided an opportunity to ask questions and all were answered. The patient agreed with the plan and demonstrated an understanding of the instructions.   The patient was advised to call back or seek an in-person evaluation if the symptoms worsen or if the condition fails to improve as anticipated.    Collaboration of Care: Other provider involved in patient's care AEB notes are available in epic to review  Patient/Guardian was advised Release of Information must be obtained prior to any record release in order to collaborate their care with an outside provider. Patient/Guardian was advised if they have not already done so to contact the registration department to sign all necessary forms in order for [place] to release information regarding their care.   Consent: Patient/Guardian gives verbal consent for treatment and assignment of benefits for services provided during this visit. Patient/Guardian expressed understanding and agreed to proceed.     I provided 25 minutes of non face to face time during this encounter.  Note: This document was prepared by [employer] voice dictation technology and any errors that results from this process are unintentional.    Cleotis Nipper, MD 02/01/2024

## 2024-02-04 ENCOUNTER — Ambulatory Visit (INDEPENDENT_AMBULATORY_CARE_PROVIDER_SITE_OTHER): Payer: Self-pay | Admitting: Medical

## 2024-02-04 ENCOUNTER — Other Ambulatory Visit (HOSPITAL_COMMUNITY): Payer: Self-pay | Admitting: Psychiatry

## 2024-02-04 VITALS — BP 104/64 | HR 113 | Temp 97.8°F | Wt 192.2 lb

## 2024-02-04 DIAGNOSIS — F4010 Social phobia, unspecified: Secondary | ICD-10-CM

## 2024-02-04 DIAGNOSIS — E860 Dehydration: Secondary | ICD-10-CM | POA: Diagnosis not present

## 2024-02-04 DIAGNOSIS — R42 Dizziness and giddiness: Secondary | ICD-10-CM | POA: Diagnosis not present

## 2024-02-04 DIAGNOSIS — F332 Major depressive disorder, recurrent severe without psychotic features: Secondary | ICD-10-CM

## 2024-02-04 MED ORDER — SCOPOLAMINE 1 MG/3DAYS TD PT72: 1.0000 | MEDICATED_PATCH | TRANSDERMAL | 0 refills | Status: DC
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
## 2024-02-05 ENCOUNTER — Other Ambulatory Visit: Payer: Self-pay | Admitting: Medical

## 2024-02-05 ENCOUNTER — Telehealth: Payer: Self-pay | Admitting: Medical

## 2024-02-05 MED ORDER — DIAZEPAM 2 MG PO TABS: 2.0000 mg | ORAL_TABLET | Freq: Two times a day (BID) | ORAL | 0 refills | Status: DC

## 2024-02-05 NOTE — Telephone Encounter (Signed)
 Please see below: pt requesting alternative medication due to cost.    Copied from CRM [id number]. Topic: Clinical - Medication Question >> Feb 05, 2024  1:33 PM Rebecca Andrade wrote: Reason for CRM: patient called said she is unable to afford the scopolamine (TRANSDERM-SCOP) 1 MG/3DAYS. She is asking for an alternate medication. Please f/u with patient

## 2024-02-06 NOTE — Telephone Encounter (Signed)
 Pt was notified.

## 2024-02-07 ENCOUNTER — Other Ambulatory Visit (HOSPITAL_COMMUNITY): Payer: Self-pay | Admitting: Psychiatry

## 2024-02-07 DIAGNOSIS — F4010 Social phobia, unspecified: Secondary | ICD-10-CM

## 2024-02-14 ENCOUNTER — Ambulatory Visit (HOSPITAL_COMMUNITY): Admitting: Licensed Clinical Social Worker

## 2024-02-20 ENCOUNTER — Telehealth: Payer: Self-pay | Admitting: Internal Medicine

## 2024-02-20 NOTE — Telephone Encounter (Unsigned)
 Copied from CRM [id number]. Topic: Clinical - Prescription Issue >> Feb 20, 2024 10:29 AM Zipporah Him wrote: Reason for CRM: Patient is needing a refill and she states she needs a higher MG as she said the 2 MG is not really doing anything. Wants to know if a high dose can be called in. Please advise.

## 2024-02-21 ENCOUNTER — Ambulatory Visit: Payer: Self-pay

## 2024-02-21 ENCOUNTER — Ambulatory Visit: Payer: Self-pay | Admitting: *Deleted

## 2024-02-21 NOTE — Telephone Encounter (Signed)
 Another message was sent today about medication

## 2024-02-21 NOTE — Telephone Encounter (Signed)
 Called patient to review request for medication dose increase for valium 2mg  . No answer, LVMTCB [phone].     Copied from CRM [id number]. Topic: Clinical - Prescription Issue >> Feb 20, 2024 10:29 AM Zipporah Him wrote: Reason for CRM: Patient is needing a refill and she states she needs a higher MG as she said the 2 MG is not really doing anything. Wants to know if a high dose can be called in. Please advise. >> Feb 21, 2024  8:31 AM Rosamond Comes wrote: Patient calling in requesting medication refill diazepam (VALIUM) 2 MG table  and asking for higher dose.  Patient is Out of medication , and would like a call back regarding this refill.   patient phone [phone]  ok to leave detailed message. [id number], [place] - [address]  [address] [place] [address]  Phone: [phone] Fax: [phone]  Hours: Not open 24 hours

## 2024-02-21 NOTE — Telephone Encounter (Signed)
 Late entry for 1005: Called pt and LM to call back.

## 2024-02-21 NOTE — Telephone Encounter (Signed)
 Copied from CRM [id number]. Topic: Clinical - Prescription Issue >> Feb 20, 2024 10:29 AM Zipporah Him wrote: Reason for CRM: Patient is needing a refill and she states she needs a higher MG as she said the 2 MG is not really doing anything. Wants to know if a high dose can be called in. Please advise. >> Feb 21, 2024  8:31 AM Rosamond Comes wrote: Patient calling in requesting medication refill diazepam (VALIUM) 2 MG table  and asking for higher dose.  Patient is Out of medication , and would like a call back regarding this refill.   patient phone [phone]  ok to leave detailed message. [id number], [place] - [address]  [address] [place] [address]  Phone: [phone] Fax: [phone]  Hours: Not open 24 hours    Patient calling for refill of her Valium 2 mg and would like to know if the dose could be increased, stating that it has not been very effective in controlling her symptoms. Patient states that she is still experiencing increased anxiety and intermittent dizziness, which she states that her PCP is aware of. Patient has declined any appointment at this time and would like a call with a response to her request today if possible.    Reason for Disposition  Caller requesting a CONTROLLED substance prescription refill (e.g., narcotics, ADHD medicines)  Answer Assessment - Initial Assessment Questions 1. DRUG NAME: "What medicine do you need to have refilled?"     Valium 2 mg 2. REFILLS REMAINING: "How many refills are remaining?" (Note: The label on the medicine or pill bottle will show how many refills are remaining. If there are no refills remaining, then a renewal may be needed.)     0 4. PRESCRIBING HCP: "Who prescribed it?" Reason: If prescribed by specialist, call should be referred to that group.     Nelda Balsam  5. SYMPTOMS: "Do you have any symptoms?"     Increased anxiety  Protocols used: Medication Refill and Renewal Call-A-AH

## 2024-02-21 NOTE — Telephone Encounter (Signed)
 Copied from CRM [id number]. Topic: Clinical - Prescription Issue >> Feb 20, 2024 10:29 AM Zipporah Him wrote: Reason for CRM: Patient is needing a refill and she states she needs a higher MG as she said the 2 MG is not really doing anything. Wants to know if a high dose can be called in. Please advise. >> Feb 21, 2024  8:31 AM Rosamond Comes wrote: Patient calling in requesting medication refill diazepam (VALIUM) 2 MG table  and asking for higher dose.  Patient is Out of medication , and would like a call back regarding this refill.   patient phone [phone]  ok to leave detailed message. [id number], [place] - [address]  [address] [place] [address]  Phone: [phone] Fax: [phone]  Hours: Not open 24 hours   Called pt and LM on VM to call back.

## 2024-02-21 NOTE — Telephone Encounter (Signed)
 Spoke with patient and she is upset that no one will fill this for her. Advised her that you not being here, other providers do not feel comfortable refilling this. Pt can't believe she has to wait until you are back in the office Monday to get this refilled.   PLEASE HANDLE ON MONDAY when returning

## 2024-02-21 NOTE — Telephone Encounter (Signed)
 Called and spoke to patient and valium is for her anxiety and would like a high dose of this. I advised her that Jimmye Moulds is out of the office and would not be able to reply. Pt states she is also feeling dizziness which is common for her. She feels the 2mg  of Valium is not helping her anxiety at all.  Pt declined an appointment. Please advise as she would like a call back today.  Sending to [place] as an FYI

## 2024-02-25 NOTE — Telephone Encounter (Signed)
 Pt states that she is having the dizziness again since being off the valium . You only gave it to her for 5 days. Pt states it was also helping her anxiety too.   What other treatment options do you have for her

## 2024-02-26 ENCOUNTER — Other Ambulatory Visit (HOSPITAL_COMMUNITY): Payer: Self-pay | Admitting: Psychiatry

## 2024-02-26 DIAGNOSIS — F331 Major depressive disorder, recurrent, moderate: Secondary | ICD-10-CM

## 2024-02-26 NOTE — Telephone Encounter (Signed)
 She says she is not willing to do this as she doesn't have money.   She just doesn't understand why you can give her valium  as its helping her.

## 2024-02-29 NOTE — Telephone Encounter (Signed)
 Patient called requesting a refill of  busPIRone  (BUSPAR ) 30 MG tablet   Preferred pharmacy [facility] [phone] Jonette Nestle, [place] - [address] Phone: [phone]  Fax: [phone]      Last fill 02-01-24  Last visit 02-01-24  Next visit 04-03-24

## 2024-03-05 ENCOUNTER — Other Ambulatory Visit (HOSPITAL_COMMUNITY): Payer: Self-pay

## 2024-03-05 DIAGNOSIS — F331 Major depressive disorder, recurrent, moderate: Secondary | ICD-10-CM

## 2024-03-05 MED ORDER — BUPROPION HCL ER (XL) 150 MG PO TB24: 150.0000 mg | ORAL_TABLET | Freq: Every day | ORAL | 0 refills | Status: DC

## 2024-03-12 ENCOUNTER — Encounter (HOSPITAL_COMMUNITY): Payer: Self-pay | Admitting: Licensed Clinical Social Worker

## 2024-03-12 ENCOUNTER — Ambulatory Visit (INDEPENDENT_AMBULATORY_CARE_PROVIDER_SITE_OTHER): Admitting: Licensed Clinical Social Worker

## 2024-03-12 DIAGNOSIS — F331 Major depressive disorder, recurrent, moderate: Secondary | ICD-10-CM
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
## 2024-03-25 ENCOUNTER — Other Ambulatory Visit (HOSPITAL_COMMUNITY): Payer: Self-pay | Admitting: Psychiatry

## 2024-03-25 ENCOUNTER — Other Ambulatory Visit: Payer: Self-pay | Admitting: Medical

## 2024-03-25 DIAGNOSIS — F4010 Social phobia, unspecified: Secondary | ICD-10-CM

## 2024-03-29 ENCOUNTER — Other Ambulatory Visit (HOSPITAL_COMMUNITY): Payer: Self-pay | Admitting: Psychiatry

## 2024-03-29 DIAGNOSIS — F4310 Post-traumatic stress disorder, unspecified: Secondary | ICD-10-CM

## 2024-03-29 DIAGNOSIS — F4010 Social phobia, unspecified: Secondary | ICD-10-CM

## 2024-04-01 ENCOUNTER — Other Ambulatory Visit (HOSPITAL_COMMUNITY): Payer: Self-pay | Admitting: Psychiatry

## 2024-04-01 DIAGNOSIS — F331 Major depressive disorder, recurrent, moderate: Secondary | ICD-10-CM

## 2024-04-01 DIAGNOSIS — F4310 Post-traumatic stress disorder, unspecified: Secondary | ICD-10-CM

## 2024-04-01 DIAGNOSIS — F4010 Social phobia, unspecified: Secondary | ICD-10-CM

## 2024-04-01 DIAGNOSIS — F1490 Cocaine use, unspecified, uncomplicated: Secondary | ICD-10-CM

## 2024-04-02 NOTE — Progress Notes (Unsigned)
 CC:  headaches PRIMARY NEUROLOGIST: Penumalli  Follow-up Visit  Update Apr 03, 2024 SS: Last visit, we ordered Vyepti  but was too expensive. About every other day migraine headache, last 20 minutes. Frontal area, top of her head, throbbing, migraine features, nauseated.Will take diclofenac , phenergan  PRN for nausea, takes meclizine  every other day. Depressed, going to see psychiatrist today.  She is disheveled, anxious.  August 20, 2023 SS: Has been out of propranolol  80 mg daily for months, not much benefit.  3-4 migraines weekly. Throbbing, sharp pain frontal. Migraine features, gets very nauseated. Takes meclizine  as needed for dizziness, may take up to twice a day. Takes phenergan  for migraine.  Diclofenac  was helpful.  Brief HPI: 53 year old female with a history of pineal cyst, kidney stones, depression, anxiety, ADHD who follows in clinic for migraines. 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  At her last visit, propranolol  was increased to 60 mg daily. Diclofenac  was started for rescue.  Interval History: 08/09/22 [place]: Headache frequency has improved slightly, but she continues to have 2 migraines per week while on propranolol . No side effect with it. Diclofenac  helps take the edge off of her migraines but does not resolve them. Phenergan  helps with nausea.  Headache days per month: 8 Headache free days per month: 22  Current Headache Regimen: Preventative: propranolol  60 mg daily Abortive: diclofenac  50-100 mg PRN, phenergan  25 mg PRN  Prior Therapies                                  Effexor  150 mg daily Fluoxetine  40 mg daily Propranolol  60 mg daily Nurtec every other day Topamax  contraindicated due to kidney stones Toradol  Diclofenac  Robaxin   Qulipta  (denied) Vyepti  (expensive)  Physical Exam:   Vital Signs: BP 108/86   Resp 15   Ht 5\' 7"  (1.702 m)   Wt 195 lb 8 oz (88.7 kg)   LMP 09/02/2018   BMI 30.62 kg/m   Physical Exam  General:  The patient is alert and cooperative at the time of the examination.  Anxious, disheveled.  Tired.  Skin: No significant peripheral edema is noted.  Neurologic Exam  Mental status: The patient is alert and oriented x 3 at the time of the examination. The patient has apparent normal recent and remote memory, with an apparently normal attention span and concentration ability.  Cranial nerves: Facial symmetry is present. Speech is normal, no aphasia or dysarthria is noted. Extraocular movements are full. Visual fields are full.  Motor: The patient has good strength in all 4 extremities.  Sensory examination: Soft touch sensation is symmetric on the face, arms, and legs.  Coordination: The patient has good finger-nose-finger and heel-to-shin bilaterally.  Gait and station: The patient has a normal gait.   Reflexes: Deep tendon reflexes are symmetric.  IMPRESSION: 53 year old female with a history of pineal cyst, kidney stones, depression, anxiety, ADHD who presents for follow up of migraines.  PLAN:  - Start Qulipta  60 mg daily for migraine prevention - Can continue diclofenac  50 mg as needed for severe migraine, may combine with Phenergan  25 mg, meclizine  25 mg as needed for prolonged nausea or dizziness with migraine. Limit treating headache to no more than 2-3 days a week to prevent rebound - Next steps: Botox, possibly restart propranolol  but would need to evaluate BP, CGRP injection is not an option due to needle phobia, reportedly no  one to give injection. Vyepti  was too expensive.  - Follow-up in 6 months   Meds ordered this encounter  Medications   Atogepant  (QULIPTA ) 60 MG TABS    Sig: Take 1 tablet (60 mg total) by mouth daily.    Dispense:  30 tablet    Refill:  11   promethazine  (PHENERGAN ) 25 MG tablet    Sig: Take 1 tablet (25 mg total) by mouth every 8 (eight) hours as needed.    Dispense:  20 tablet    Refill:  3    This is 30 day supply   meclizine  (ANTIVERT ) 25  MG tablet    Sig: Take 1 tablet (25 mg total) by mouth 2 (two) times daily as needed.    Dispense:  60 tablet    Refill:  1   diclofenac  (CATAFLAM ) 50 MG tablet    Sig: Take 1 tablet (50 mg total) by mouth 2 (two) times daily as needed.    Dispense:  12 tablet    Refill:  5   Cortland Ding, DNP  [facility] [address] [place], [place] [address] [phone]

## 2024-04-03 ENCOUNTER — Encounter (HOSPITAL_COMMUNITY): Payer: Self-pay | Admitting: Psychiatry

## 2024-04-03 ENCOUNTER — Telehealth (HOSPITAL_COMMUNITY): Payer: Self-pay

## 2024-04-03 ENCOUNTER — Other Ambulatory Visit: Payer: Self-pay

## 2024-04-03 ENCOUNTER — Ambulatory Visit: Payer: Medicare HMO | Admitting: Neurology

## 2024-04-03 ENCOUNTER — Telehealth: Payer: Self-pay | Admitting: Neurology

## 2024-04-03 ENCOUNTER — Encounter: Payer: Self-pay | Admitting: Neurology

## 2024-04-03 ENCOUNTER — Ambulatory Visit (HOSPITAL_COMMUNITY): Admitting: Psychiatry

## 2024-04-03 VITALS — BP 108/86 | Resp 15 | Ht 67.0 in | Wt 195.5 lb

## 2024-04-03 VITALS — BP 122/80 | HR 93 | Ht 67.0 in | Wt 195.0 lb

## 2024-04-03 DIAGNOSIS — F4010 Social phobia, unspecified: Secondary | ICD-10-CM | POA: Diagnosis not present

## 2024-04-03 DIAGNOSIS — F4310 Post-traumatic stress disorder, unspecified: Secondary | ICD-10-CM

## 2024-04-03 DIAGNOSIS — F339 Major depressive disorder, recurrent, unspecified: Secondary | ICD-10-CM

## 2024-04-03 DIAGNOSIS — G43809 Other migraine, not intractable, without status migrainosus: Secondary | ICD-10-CM | POA: Diagnosis not present

## 2024-04-03 DIAGNOSIS — F331 Major depressive disorder, recurrent, moderate: Secondary | ICD-10-CM

## 2024-04-03 DIAGNOSIS — F900 Attention-deficit hyperactivity disorder, predominantly inattentive type: Secondary | ICD-10-CM

## 2024-04-03 MED ORDER — QULIPTA 60 MG PO TABS: 60.0000 mg | ORAL_TABLET | Freq: Every day | ORAL | 11 refills | Status: DC

## 2024-04-03 MED ORDER — AMPHETAMINE-DEXTROAMPHETAMINE 10 MG PO TABS: 10.0000 mg | ORAL_TABLET | Freq: Two times a day (BID) | ORAL | 0 refills | Status: DC

## 2024-04-03 MED ORDER — VENLAFAXINE HCL ER 75 MG PO CP24: 225.0000 mg | ORAL_CAPSULE | Freq: Every day | ORAL | 1 refills | Status: DC

## 2024-04-03 MED ORDER — MECLIZINE HCL 25 MG PO TABS: 25.0000 mg | ORAL_TABLET | Freq: Two times a day (BID) | ORAL | 1 refills | Status: DC | PRN

## 2024-04-03 MED ORDER — PROMETHAZINE HCL 25 MG PO TABS: 25.0000 mg | ORAL_TABLET | Freq: Three times a day (TID) | ORAL | 3 refills | Status: DC | PRN

## 2024-04-03 MED ORDER — BUSPIRONE HCL 30 MG PO TABS
30.0000 mg | ORAL_TABLET | Freq: Two times a day (BID) | ORAL | 1 refills | Status: DC
Start: 2024-04-03 — End: 2024-06-05

## 2024-04-03 MED ORDER — QUETIAPINE FUMARATE 200 MG PO TABS: 200.0000 mg | ORAL_TABLET | Freq: Every day | ORAL | 1 refills | Status: DC

## 2024-04-03 MED ORDER — DICLOFENAC POTASSIUM 50 MG PO TABS: 50.0000 mg | ORAL_TABLET | Freq: Two times a day (BID) | ORAL | 5 refills | Status: DC | PRN
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
## 2024-04-03 NOTE — Telephone Encounter (Signed)
 Patient was here today for a follow up with Dr. Arfeen. After the appointment, Dr. Arfeen asked me to do a U tox on patient. Patient provided a sample and it read negative for all substances. Patient said she is willing to come in anytime Dr. Arfeen askes for a Utox.

## 2024-04-03 NOTE — Telephone Encounter (Signed)
 Pt called stating she will arrive after 9:45, pt was told it is possible she will be told she will have to r/s  if she arrives after grace period of 5 mins.

## 2024-04-03 NOTE — Patient Instructions (Signed)
 We will try Qulipta  60 mg daily for migraine prevention  Use diclofenac  as needed for severe migraine, limit use to no more than 2 days a week Follow up with psychiatry  Follow up with me in 6 months

## 2024-04-09 ENCOUNTER — Ambulatory Visit (HOSPITAL_COMMUNITY): Admitting: Licensed Clinical Social Worker

## 2024-04-14 ENCOUNTER — Telehealth: Payer: Self-pay | Admitting: Internal Medicine

## 2024-04-14 ENCOUNTER — Telehealth: Payer: Self-pay

## 2024-04-14 ENCOUNTER — Other Ambulatory Visit (HOSPITAL_COMMUNITY): Payer: Self-pay

## 2024-04-14 NOTE — Telephone Encounter (Signed)
 Copied from CRM [id number]. Topic: Clinical - Prescription Issue >> Apr 14, 2024  1:54 PM Carlatta H wrote: Reason for CRM: Patient called to advised that prescription for Fezolinetant  (VEOZAH ) 45 MG TABS [[id number]] needs prior authorization from insurance

## 2024-04-14 NOTE — Telephone Encounter (Signed)
 Pharmacy Patient Advocate Encounter   Received notification from Physician's Office that prior authorization for Veozah  45MG  tablets is required/requested.   Insurance verification completed.   The patient is insured through [facility] .   Per test claim: PA required; PA submitted to above mentioned insurance via CoverMyMeds Key/confirmation #/EOC (Key: [id number])    Status is pending

## 2024-04-14 NOTE — Telephone Encounter (Signed)
 Pharmacy Patient Advocate Encounter  Received notification from [facility]IC[facility] that Prior Authorization for  Veozah  45MG  tablets  has been APPROVED from 5.1.25 to 12.31.25. Ran test claim, Copay is $0.00. This test claim was processed through [facility]- copay amounts may vary at other pharmacies due to pharmacy/plan contracts, or as the patient moves through the different stages of their insurance plan.

## 2024-04-25 ENCOUNTER — Other Ambulatory Visit (HOSPITAL_COMMUNITY): Payer: Self-pay | Admitting: Psychiatry

## 2024-04-25 DIAGNOSIS — F4310 Post-traumatic stress disorder, unspecified: Secondary | ICD-10-CM

## 2024-04-25 DIAGNOSIS — F4010 Social phobia, unspecified: Secondary | ICD-10-CM

## 2024-04-30 ENCOUNTER — Encounter (HOSPITAL_COMMUNITY): Payer: Self-pay

## 2024-04-30 ENCOUNTER — Ambulatory Visit (HOSPITAL_COMMUNITY): Admitting: Licensed Clinical Social Worker

## 2024-04-30 ENCOUNTER — Other Ambulatory Visit (HOSPITAL_COMMUNITY): Payer: Self-pay | Admitting: Psychiatry

## 2024-04-30 DIAGNOSIS — F331 Major depressive disorder, recurrent, moderate: Secondary | ICD-10-CM

## 2024-04-30 DIAGNOSIS — F4310 Post-traumatic stress disorder, unspecified: Secondary | ICD-10-CM

## 2024-04-30 DIAGNOSIS — F4010 Social phobia, unspecified: Secondary | ICD-10-CM

## 2024-05-02 ENCOUNTER — Telehealth (HOSPITAL_COMMUNITY): Payer: Self-pay

## 2024-05-02 NOTE — Telephone Encounter (Signed)
 Patient called to request a refill for amphetamine -dextroamphetamine  (ADDERALL) 10 MG tablet     Last visit 04-03-24 Next visit 06-05-24  [phone] [address] [place] - [address] Phone: [phone]  Fax: [phone]

## 2024-05-05 ENCOUNTER — Telehealth (HOSPITAL_COMMUNITY): Payer: Self-pay

## 2024-05-05 DIAGNOSIS — F900 Attention-deficit hyperactivity disorder, predominantly inattentive type: Secondary | ICD-10-CM

## 2024-05-05 MED ORDER — AMPHETAMINE-DEXTROAMPHETAMINE 10 MG PO TABS: 10.0000 mg | ORAL_TABLET | Freq: Two times a day (BID) | ORAL | 0 refills | Status: DC

## 2024-05-05 NOTE — Telephone Encounter (Signed)
 Done

## 2024-05-05 NOTE — Addendum Note (Signed)
 Addended by: CURRY PATERSON T on: 05/05/2024 02:02 PM   Modules accepted: Orders

## 2024-05-05 NOTE — Telephone Encounter (Signed)
 Patient is calling for a refill on Adderall, last filled on 04/03/2024. Patient was seen on 5/29 and has a follow up on 06/05/2024 - [facility] [address]. Please review and advise, thank you

## 2024-05-07 ENCOUNTER — Telehealth (HOSPITAL_COMMUNITY): Payer: Self-pay

## 2024-05-07 NOTE — Telephone Encounter (Signed)
 She is on a stimulant and should not be taking benzodiazepine.  If she decided Valium  then we may have to discontinue Adderall.

## 2024-05-07 NOTE — Telephone Encounter (Signed)
 Patient called and advised me that her PCP gave her Valium  5 mg 1 to 2 times a day as needed. Patient would like to know if you can prescribe this. I have checked the medication reconciliation and I do not see this medication....please review and advise, thank you

## 2024-05-27 ENCOUNTER — Other Ambulatory Visit (HOSPITAL_COMMUNITY): Payer: Self-pay | Admitting: Psychiatry

## 2024-05-27 DIAGNOSIS — F4310 Post-traumatic stress disorder, unspecified: Secondary | ICD-10-CM

## 2024-05-27 DIAGNOSIS — F4010 Social phobia, unspecified: Secondary | ICD-10-CM

## 2024-05-29 ENCOUNTER — Other Ambulatory Visit (HOSPITAL_COMMUNITY): Payer: Self-pay | Admitting: Psychiatry

## 2024-05-29 DIAGNOSIS — F4310 Post-traumatic stress disorder, unspecified: Secondary | ICD-10-CM

## 2024-05-29 DIAGNOSIS — F4010 Social phobia, unspecified: Secondary | ICD-10-CM

## 2024-05-29 DIAGNOSIS — F331 Major depressive disorder, recurrent, moderate: Secondary | ICD-10-CM

## 2024-06-04 ENCOUNTER — Ambulatory Visit (HOSPITAL_COMMUNITY): Admitting: Licensed Clinical Social Worker

## 2024-06-04 ENCOUNTER — Encounter (HOSPITAL_COMMUNITY): Payer: Self-pay

## 2024-06-05 ENCOUNTER — Ambulatory Visit (HOSPITAL_COMMUNITY): Admitting: Psychiatry

## 2024-06-05 ENCOUNTER — Encounter (HOSPITAL_COMMUNITY): Payer: Self-pay | Admitting: Psychiatry

## 2024-06-05 VITALS — BP 114/75 | HR 99 | Ht 67.0 in | Wt 197.0 lb

## 2024-06-05 DIAGNOSIS — F900 Attention-deficit hyperactivity disorder, predominantly inattentive type: Secondary | ICD-10-CM | POA: Diagnosis not present

## 2024-06-05 DIAGNOSIS — F4010 Social phobia, unspecified: Secondary | ICD-10-CM | POA: Diagnosis not present

## 2024-06-05 DIAGNOSIS — F4310 Post-traumatic stress disorder, unspecified: Secondary | ICD-10-CM

## 2024-06-05 DIAGNOSIS — F331 Major depressive disorder, recurrent, moderate: Secondary | ICD-10-CM | POA: Diagnosis not present

## 2024-06-05 MED ORDER — BUSPIRONE HCL 30 MG PO TABS: 30.0000 mg | ORAL_TABLET | Freq: Two times a day (BID) | ORAL | 1 refills | Status: DC

## 2024-06-05 MED ORDER — VENLAFAXINE HCL ER 150 MG PO CP24: 300.0000 mg | ORAL_CAPSULE | Freq: Every day | ORAL | 1 refills | Status: DC

## 2024-06-05 MED ORDER — QUETIAPINE FUMARATE 300 MG PO TABS: 300.0000 mg | ORAL_TABLET | Freq: Every day | ORAL | 1 refills | Status: DC

## 2024-06-05 MED ORDER — AMPHETAMINE-DEXTROAMPHETAMINE 10 MG PO TABS: 10.0000 mg | ORAL_TABLET | Freq: Two times a day (BID) | ORAL | 0 refills | Status: DC
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
## 2024-06-06 ENCOUNTER — Other Ambulatory Visit (HOSPITAL_COMMUNITY): Payer: Self-pay | Admitting: Psychiatry

## 2024-06-06 DIAGNOSIS — F4010 Social phobia, unspecified: Secondary | ICD-10-CM

## 2024-06-06 DIAGNOSIS — F4310 Post-traumatic stress disorder, unspecified: Secondary | ICD-10-CM

## 2024-06-06 DIAGNOSIS — F331 Major depressive disorder, recurrent, moderate: Secondary | ICD-10-CM

## 2024-06-11 ENCOUNTER — Telehealth (HOSPITAL_COMMUNITY): Payer: Self-pay

## 2024-06-11 NOTE — Telephone Encounter (Signed)
 Patient is calling to see if she can get a refill on the Hydroxyzine , it was last prescribed in March, patient only takes as needed. Please review and advise, thank you

## 2024-06-11 NOTE — Telephone Encounter (Signed)
 I spoke to the patient, she is okay with not taking the Hydroxyzine , she does not want to go down on the Adderall.

## 2024-06-11 NOTE — Telephone Encounter (Signed)
 We increased Effexor  dose, Seroquel  dose on the last visit.  She is also on BuSpar  30 mg twice a day.  I do not want to add more medication.  If she feels anxiety is worsening then she need to cut down the Adderall.

## 2024-06-27 ENCOUNTER — Other Ambulatory Visit (HOSPITAL_COMMUNITY): Payer: Self-pay | Admitting: Psychiatry

## 2024-06-27 DIAGNOSIS — F4010 Social phobia, unspecified: Secondary | ICD-10-CM

## 2024-06-27 DIAGNOSIS — F331 Major depressive disorder, recurrent, moderate: Secondary | ICD-10-CM

## 2024-06-27 DIAGNOSIS — F4310 Post-traumatic stress disorder, unspecified: Secondary | ICD-10-CM

## 2024-07-03 ENCOUNTER — Telehealth (HOSPITAL_COMMUNITY): Payer: Self-pay

## 2024-07-03 DIAGNOSIS — F900 Attention-deficit hyperactivity disorder, predominantly inattentive type: Secondary | ICD-10-CM

## 2024-07-03 MED ORDER — AMPHETAMINE-DEXTROAMPHETAMINE 10 MG PO TABS: 10.0000 mg | ORAL_TABLET | Freq: Two times a day (BID) | ORAL | 0 refills | Status: DC

## 2024-07-03 NOTE — Telephone Encounter (Signed)
 Prescription sent.  She can pick up on her due date

## 2024-07-03 NOTE — Telephone Encounter (Signed)
 Patient is calling for a refill on her Adderall, last filled 7/31. [facility]25. Please review qand advise, thank you

## 2024-07-04 ENCOUNTER — Telehealth: Payer: Self-pay | Admitting: Lab

## 2024-07-04 ENCOUNTER — Other Ambulatory Visit: Payer: Self-pay | Admitting: Neurology

## 2024-07-04 ENCOUNTER — Other Ambulatory Visit: Payer: Self-pay | Admitting: Medical

## 2024-07-04 NOTE — Telephone Encounter (Signed)
Patient will be scheduling appointment.

## 2024-07-04 NOTE — Telephone Encounter (Signed)
 Last seen on 04/03/24 Follow up scheduled on 11/04/24   Dispensed Days Supply Quantity Provider Pharmacy  MECLIZINE  25 MG TABLET 06/10/2024 30 60 each [name], NP [facility] [phone] - [email or web address]

## 2024-07-04 NOTE — Telephone Encounter (Signed)
 Patient is requesting handicap sticker please advise on approval and for how long?

## 2024-07-09 NOTE — Telephone Encounter (Signed)
 That is fine to give cortisone

## 2024-07-28 ENCOUNTER — Other Ambulatory Visit: Payer: Self-pay | Admitting: Medical

## 2024-07-28 DIAGNOSIS — E782 Mixed hyperlipidemia: Secondary | ICD-10-CM

## 2024-07-31 ENCOUNTER — Other Ambulatory Visit: Payer: Self-pay

## 2024-07-31 ENCOUNTER — Encounter (HOSPITAL_COMMUNITY): Payer: Self-pay | Admitting: Psychiatry

## 2024-07-31 ENCOUNTER — Ambulatory Visit (HOSPITAL_BASED_OUTPATIENT_CLINIC_OR_DEPARTMENT_OTHER): Admitting: Psychiatry

## 2024-07-31 ENCOUNTER — Other Ambulatory Visit (HOSPITAL_COMMUNITY): Payer: Self-pay | Admitting: Psychiatry

## 2024-07-31 VITALS — BP 118/81 | HR 99 | Ht 67.0 in | Wt 194.0 lb

## 2024-07-31 DIAGNOSIS — F4310 Post-traumatic stress disorder, unspecified: Secondary | ICD-10-CM | POA: Diagnosis not present

## 2024-07-31 DIAGNOSIS — F331 Major depressive disorder, recurrent, moderate: Secondary | ICD-10-CM | POA: Diagnosis not present

## 2024-07-31 DIAGNOSIS — F900 Attention-deficit hyperactivity disorder, predominantly inattentive type: Secondary | ICD-10-CM | POA: Diagnosis not present

## 2024-07-31 DIAGNOSIS — F4010 Social phobia, unspecified: Secondary | ICD-10-CM

## 2024-07-31 MED ORDER — BUSPIRONE HCL 30 MG PO TABS: 30.0000 mg | ORAL_TABLET | Freq: Two times a day (BID) | ORAL | 2 refills | Status: DC

## 2024-07-31 MED ORDER — VENLAFAXINE HCL ER 150 MG PO CP24: 300.0000 mg | ORAL_CAPSULE | Freq: Every day | ORAL | 2 refills | Status: DC

## 2024-07-31 MED ORDER — QUETIAPINE FUMARATE 300 MG PO TABS: 300.0000 mg | ORAL_TABLET | Freq: Every day | ORAL | 2 refills | Status: DC

## 2024-07-31 MED ORDER — AMPHETAMINE-DEXTROAMPHETAMINE 10 MG PO TABS: 10.0000 mg | ORAL_TABLET | Freq: Two times a day (BID) | ORAL | 0 refills | Status: DC
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
## 2024-08-05 ENCOUNTER — Other Ambulatory Visit: Payer: Self-pay | Admitting: Medical

## 2024-08-05 DIAGNOSIS — Z1231 Encounter for screening mammogram for malignant neoplasm of breast: Secondary | ICD-10-CM

## 2024-08-14 ENCOUNTER — Telehealth: Payer: Self-pay | Admitting: Internal Medicine

## 2024-08-14 NOTE — Telephone Encounter (Unsigned)
 Copied from CRM [id number]. Topic: Clinical - Prescription Issue >> Aug 14, 2024  7:35 AM Darshell M wrote: Reason for CRM:  VEOZAH  45 MG TABS   Prescription refill was denied. [place] CB# [phone].

## 2024-08-18 MED ORDER — VEOZAH 45 MG PO TABS: 1.0000 | ORAL_TABLET | Freq: Every day | ORAL | 1 refills | Status: AC

## 2024-08-18 NOTE — Telephone Encounter (Signed)
 I do not see this was denied. I went ahead and refilled this   Copied from CRM #[id number]. Topic: Clinical - Prescription Issue >> Aug 18, 2024  8:53 AM Rebecca Andrade wrote: Reason for CRM: Patient called in to follow up on VEOZAH  45 MG TABS refill request. Denied but no reason for denial please call [phone] to explain further

## 2024-08-18 NOTE — Telephone Encounter (Signed)
 Pt was notified. Pt is not sure if its not covered or if she was just out of refills with pharmacy. I went ahead and sent in a refill. Advised pt if not covered to follow-up with [facility] for other options

## 2024-08-24 ENCOUNTER — Other Ambulatory Visit (HOSPITAL_COMMUNITY): Payer: Self-pay | Admitting: Psychiatry

## 2024-08-24 DIAGNOSIS — F4310 Post-traumatic stress disorder, unspecified: Secondary | ICD-10-CM

## 2024-08-24 DIAGNOSIS — F331 Major depressive disorder, recurrent, moderate: Secondary | ICD-10-CM

## 2024-08-24 DIAGNOSIS — F4010 Social phobia, unspecified: Secondary | ICD-10-CM

## 2024-09-01 ENCOUNTER — Telehealth (HOSPITAL_COMMUNITY): Payer: Self-pay

## 2024-09-01 DIAGNOSIS — F900 Attention-deficit hyperactivity disorder, predominantly inattentive type: Secondary | ICD-10-CM

## 2024-09-01 MED ORDER — AMPHETAMINE-DEXTROAMPHETAMINE 10 MG PO TABS: 10.0000 mg | ORAL_TABLET | Freq: Two times a day (BID) | ORAL | 0 refills | Status: DC

## 2024-09-01 NOTE — Telephone Encounter (Signed)
 Patient is calling for a refill on her Adderall, last filled on 9/27. Patient follow up is 12/18

## 2024-09-01 NOTE — Telephone Encounter (Signed)
 I called patient and let her know that it was sent

## 2024-09-01 NOTE — Telephone Encounter (Signed)
 Prescription called to the pharmacy.  Please inform the patient.  Thank you.

## 2024-09-09 ENCOUNTER — Ambulatory Visit: Payer: Self-pay

## 2024-09-09 ENCOUNTER — Ambulatory Visit

## 2024-09-09 VITALS — BP 108/64 | HR 89 | Temp 98.2°F | Ht 67.0 in | Wt 192.6 lb

## 2024-09-09 DIAGNOSIS — Z0000 Encounter for general adult medical examination without abnormal findings: Secondary | ICD-10-CM | POA: Diagnosis not present
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
## 2024-09-09 NOTE — Patient Instructions (Signed)
 Ms. Hakeem,  Thank you for taking the time for your Medicare Wellness Visit. I appreciate your continued commitment to your health goals. Please review the care plan we discussed, and feel free to reach out if I can assist you further.  Please note that Annual Wellness Visits do not include a physical exam. Some assessments may be limited, especially if the visit was conducted virtually. If needed, we may recommend an in-person follow-up with your provider.  Ongoing Care Seeing your primary care provider every 3 to 6 months helps us  monitor your health and provide consistent, personalized care.   Referrals If a referral was made during today's visit and you haven't received any updates within two weeks, please contact the referred provider directly to check on the status.  Recommended Screenings:  Health Maintenance  Topic Date Due   Hepatitis B Vaccine (1 of 3 - 19+ 3-dose series) Never done   Pneumococcal Vaccine for age over 72 (1 of 1 - PCV) Never done   Zoster (Shingles) Vaccine (2 of 2) 06/29/2023   Flu Shot  06/06/2024   COVID-19 Vaccine (4 - 2025-26 season) 07/07/2024   Medicare Annual Wellness Visit  07/30/2024   Colon Cancer Screening  08/13/2024   Breast Cancer Screening  09/05/2025   Pap with HPV screening  09/09/2026   DTaP/Tdap/Td vaccine (2 - Td or Tdap) 05/28/2028   Hepatitis C Screening  Completed   HIV Screening  Completed   HPV Vaccine  Aged Out   Meningitis B Vaccine  Aged Out       09/09/2024    9:01 AM  Advanced Directives  Does Patient Have a Medical Advance Directive? No    Vision: Annual vision screenings are recommended for early detection of glaucoma, cataracts, and diabetic retinopathy. These exams can also reveal signs of chronic conditions such as diabetes and high blood pressure.  Dental: Annual dental screenings help detect early signs of oral cancer, gum disease, and other conditions linked to overall health, including heart disease and  diabetes.  Please see the attached documents for additional preventive care recommendations.

## 2024-09-10 ENCOUNTER — Telehealth: Payer: Self-pay | Admitting: Internal Medicine

## 2024-09-10 ENCOUNTER — Ambulatory Visit: Admitting: Medical

## 2024-09-10 VITALS — BP 110/70 | HR 79 | Wt 191.2 lb

## 2024-09-10 DIAGNOSIS — F4010 Social phobia, unspecified: Secondary | ICD-10-CM | POA: Diagnosis not present

## 2024-09-10 DIAGNOSIS — N951 Menopausal and female climacteric states: Secondary | ICD-10-CM

## 2024-09-10 DIAGNOSIS — F339 Major depressive disorder, recurrent, unspecified: Secondary | ICD-10-CM

## 2024-09-10 DIAGNOSIS — Z7185 Encounter for immunization safety counseling: Secondary | ICD-10-CM | POA: Diagnosis not present

## 2024-09-10 DIAGNOSIS — E785 Hyperlipidemia, unspecified: Secondary | ICD-10-CM

## 2024-09-10 DIAGNOSIS — Z79899 Other long term (current) drug therapy: Secondary | ICD-10-CM

## 2024-09-10 DIAGNOSIS — R911 Solitary pulmonary nodule: Secondary | ICD-10-CM | POA: Diagnosis not present

## 2024-09-10 DIAGNOSIS — Z780 Asymptomatic menopausal state: Secondary | ICD-10-CM

## 2024-09-10 DIAGNOSIS — I700 Atherosclerosis of aorta: Secondary | ICD-10-CM

## 2024-09-10 DIAGNOSIS — Z129 Encounter for screening for malignant neoplasm, site unspecified: Secondary | ICD-10-CM | POA: Diagnosis not present
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
## 2024-09-10 NOTE — Telephone Encounter (Signed)
 Patient picked up Veozah  for a 3 month supply last week and was told she would need another PA done to get approval for anymore refills. Can we go ahead and start the process for this.

## 2024-09-11 ENCOUNTER — Telehealth: Payer: Self-pay

## 2024-09-11 ENCOUNTER — Other Ambulatory Visit (HOSPITAL_COMMUNITY): Payer: Self-pay

## 2024-09-11 ENCOUNTER — Ambulatory Visit: Payer: Self-pay | Admitting: Medical

## 2024-09-11 LAB — HEPATIC FUNCTION PANEL
ALT: 9 IU/L (ref 0–32)
AST: 16 IU/L (ref 0–40)
Albumin: 3.8 g/dL (ref 3.8–4.9)
Alkaline Phosphatase: 100 IU/L (ref 49–135)
Bilirubin Total: 0.5 mg/dL (ref 0.0–1.2)
Bilirubin, Direct: 0.15 mg/dL (ref 0.00–0.40)
Total Protein: 6 g/dL (ref 6.0–8.5)

## 2024-09-11 NOTE — Progress Notes (Signed)
 Results to MyChart

## 2024-09-11 NOTE — Telephone Encounter (Signed)
 P/A REAUTHORIZATION POSTPONED UNTIL THE FOLLOWING DATE:

## 2024-09-11 NOTE — Telephone Encounter (Signed)
 Request:           T/C.  P/A is currently too soon to resubmit as the authorization doesn't expire until 12.31.25

## 2024-09-24 ENCOUNTER — Ambulatory Visit
Admission: RE | Admit: 2024-09-24 | Discharge: 2024-09-24 | Disposition: A | Discharge status: Home / Self Care | Source: Ambulatory Visit | Attending: Medical | Admitting: Medical

## 2024-09-24 DIAGNOSIS — R911 Solitary pulmonary nodule: Secondary | ICD-10-CM

## 2024-09-24 DIAGNOSIS — J479 Bronchiectasis, uncomplicated: Secondary | ICD-10-CM | POA: Diagnosis not present

## 2024-09-25 ENCOUNTER — Other Ambulatory Visit: Payer: Self-pay

## 2024-09-25 ENCOUNTER — Other Ambulatory Visit: Payer: Self-pay | Admitting: Neurology

## 2024-09-26 ENCOUNTER — Telehealth: Payer: Self-pay | Admitting: Neurology

## 2024-09-26 NOTE — Telephone Encounter (Signed)
 Appointment details confirmed

## 2024-09-29 NOTE — Progress Notes (Signed)
 Results through MyChart

## 2024-09-30 ENCOUNTER — Other Ambulatory Visit: Payer: Self-pay | Admitting: Medical

## 2024-09-30 ENCOUNTER — Telehealth: Payer: Self-pay | Admitting: Family Medicine

## 2024-09-30 ENCOUNTER — Telehealth (HOSPITAL_COMMUNITY): Payer: Self-pay

## 2024-09-30 ENCOUNTER — Ambulatory Visit
Admission: RE | Admit: 2024-09-30 | Discharge: 2024-09-30 | Disposition: A | Discharge status: Home / Self Care | Source: Ambulatory Visit | Attending: Medical | Admitting: Medical

## 2024-09-30 ENCOUNTER — Telehealth: Payer: Self-pay | Admitting: Internal Medicine

## 2024-09-30 DIAGNOSIS — Z1231 Encounter for screening mammogram for malignant neoplasm of breast: Secondary | ICD-10-CM | POA: Diagnosis not present

## 2024-09-30 DIAGNOSIS — F900 Attention-deficit hyperactivity disorder, predominantly inattentive type: Secondary | ICD-10-CM

## 2024-09-30 MED ORDER — AMPHETAMINE-DEXTROAMPHETAMINE 10 MG PO TABS: 10.0000 mg | ORAL_TABLET | Freq: Two times a day (BID) | ORAL | 0 refills | Status: DC

## 2024-09-30 MED ORDER — LINACLOTIDE 290 MCG PO CAPS: 290.0000 ug | ORAL_CAPSULE | Freq: Every day | ORAL | 0 refills | Status: DC

## 2024-09-30 NOTE — Telephone Encounter (Signed)
 Called patient and let her know.

## 2024-09-30 NOTE — Telephone Encounter (Signed)
 Patient is calling for a refill on her Adderall. She states that she is due on 11/27, but the pharmacy will be closed on Thanksgiving. She wants to know if you can send it to fill on 11/26. Please review and advise, thank you

## 2024-09-30 NOTE — Telephone Encounter (Signed)
 I called pt and advised results said Chronic.  Patient wants to know if you will call her in something for it to [facility] [address]  She is also out of Linzess .   Copied from CRM #[id number]. Topic: Clinical - Request for Lab/Test Order >> Sep 30, 2024  9:32 AM Myrick T wrote: Reason for CRM: patient called said she was given her imaging results but she could not remember if it was chronic bronchitis or just bronchitis. Please f/u with patient

## 2024-09-30 NOTE — Telephone Encounter (Signed)
 I called pt and advised results state Chronic Bronchitis

## 2024-09-30 NOTE — Telephone Encounter (Signed)
 Patient called.

## 2024-09-30 NOTE — Telephone Encounter (Signed)
 Copied from CRM #[id number]. Topic: Clinical - Request for Lab/Test Order >> Sep 30, 2024  9:32 AM Myrick T wrote: Reason for CRM: patient called said she was given her imaging results but she could not remember if it was chronic bronchitis or just bronchitis. Please f/u with patient

## 2024-09-30 NOTE — Telephone Encounter (Signed)
 Prescription sent to the pharmacy today.  Please inform the patient.  Thank you.

## 2024-10-06 ENCOUNTER — Ambulatory Visit: Payer: Self-pay | Admitting: Medical

## 2024-10-21 ENCOUNTER — Ambulatory Visit

## 2024-10-22 ENCOUNTER — Other Ambulatory Visit (HOSPITAL_COMMUNITY): Payer: Self-pay | Admitting: Psychiatry

## 2024-10-22 DIAGNOSIS — F4310 Post-traumatic stress disorder, unspecified: Secondary | ICD-10-CM

## 2024-10-22 DIAGNOSIS — F331 Major depressive disorder, recurrent, moderate: Secondary | ICD-10-CM

## 2024-10-22 DIAGNOSIS — F4010 Social phobia, unspecified: Secondary | ICD-10-CM

## 2024-10-23 ENCOUNTER — Telehealth (HOSPITAL_COMMUNITY): Payer: Self-pay | Admitting: *Deleted

## 2024-10-23 ENCOUNTER — Encounter (HOSPITAL_COMMUNITY): Payer: Self-pay | Admitting: Psychiatry

## 2024-10-23 ENCOUNTER — Ambulatory Visit (HOSPITAL_COMMUNITY): Admitting: Psychiatry

## 2024-10-23 ENCOUNTER — Other Ambulatory Visit: Payer: Self-pay

## 2024-10-23 DIAGNOSIS — F331 Major depressive disorder, recurrent, moderate: Secondary | ICD-10-CM | POA: Diagnosis not present

## 2024-10-23 DIAGNOSIS — F900 Attention-deficit hyperactivity disorder, predominantly inattentive type: Secondary | ICD-10-CM | POA: Diagnosis not present

## 2024-10-23 DIAGNOSIS — F4010 Social phobia, unspecified: Secondary | ICD-10-CM | POA: Diagnosis not present

## 2024-10-23 DIAGNOSIS — F4310 Post-traumatic stress disorder, unspecified: Secondary | ICD-10-CM | POA: Diagnosis not present

## 2024-10-23 MED ORDER — BUSPIRONE HCL 30 MG PO TABS: 30.0000 mg | ORAL_TABLET | Freq: Two times a day (BID) | ORAL | 2 refills | Status: AC

## 2024-10-23 MED ORDER — VENLAFAXINE HCL ER 150 MG PO CP24: 300.0000 mg | ORAL_CAPSULE | Freq: Every day | ORAL | 2 refills | Status: AC

## 2024-10-23 MED ORDER — AMPHETAMINE-DEXTROAMPHETAMINE 10 MG PO TABS: 10.0000 mg | ORAL_TABLET | Freq: Two times a day (BID) | ORAL | 0 refills | Status: DC

## 2024-10-23 MED ORDER — QUETIAPINE FUMARATE 300 MG PO TABS: 300.0000 mg | ORAL_TABLET | Freq: Every day | ORAL | 2 refills | Status: AC

## 2024-10-23 NOTE — Telephone Encounter (Signed)
 Pt in office today for provider visit and UTOX screen. Urine test cup was provided to pt with instructions. Pt verbalizes understanding. [name] was in the bathroom for a short amount of time then came out with an empty cup stating I cant go. Pt was in a hurry and on her cell phone and said that she had to leave as her mother had fallen. Pt was advised that we would call her regarding repeating test. No answer when attempted to call pt to reschedule. Will f/u x 2.

## 2024-10-23 NOTE — Telephone Encounter (Signed)
 Thanks for the update.  If she calls back then remind to get the urine toxicology.
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
## 2024-10-27 ENCOUNTER — Ambulatory Visit (HOSPITAL_COMMUNITY): Admitting: *Deleted

## 2024-10-27 ENCOUNTER — Telehealth (HOSPITAL_COMMUNITY): Payer: Self-pay | Admitting: Psychiatry

## 2024-10-27 DIAGNOSIS — F909 Attention-deficit hyperactivity disorder, unspecified type: Secondary | ICD-10-CM

## 2024-10-27 DIAGNOSIS — Z79899 Other long term (current) drug therapy: Secondary | ICD-10-CM

## 2024-10-27 NOTE — Telephone Encounter (Cosign Needed)
 Patient came in early morning to do urinalysis. Patient did rapid test urinalysis which came back positive for amphetamines and cocaine.patient presented well groomed and with appropriate affect. Will send sample to [facility]. Please review and advise

## 2024-10-28 DIAGNOSIS — Z79899 Other long term (current) drug therapy: Secondary | ICD-10-CM | POA: Diagnosis not present

## 2024-10-28 NOTE — Addendum Note (Signed)
 Addended by: Ramses Klecka E on: 10/28/2024 01:38 PM   Modules accepted: Orders

## 2024-10-29 ENCOUNTER — Other Ambulatory Visit: Payer: Self-pay | Admitting: Neurology

## 2024-10-29 NOTE — Telephone Encounter (Signed)
 Last seen on 04/03/24 Follow up scheduled on 11/04/24

## 2024-11-01 LAB — TOXASSURE SELECT 13 (MW), URINE

## 2024-11-04 ENCOUNTER — Ambulatory Visit (INDEPENDENT_AMBULATORY_CARE_PROVIDER_SITE_OTHER): Admitting: Neurology

## 2024-11-04 ENCOUNTER — Telehealth: Payer: Self-pay

## 2024-11-04 ENCOUNTER — Other Ambulatory Visit (HOSPITAL_COMMUNITY): Payer: Self-pay

## 2024-11-04 ENCOUNTER — Encounter: Payer: Self-pay | Admitting: Neurology

## 2024-11-04 VITALS — BP 123/81 | HR 84 | Ht 67.0 in | Wt 193.4 lb

## 2024-11-04 DIAGNOSIS — G43809 Other migraine, not intractable, without status migrainosus: Secondary | ICD-10-CM | POA: Diagnosis not present

## 2024-11-04 DIAGNOSIS — F339 Major depressive disorder, recurrent, unspecified: Secondary | ICD-10-CM | POA: Diagnosis not present

## 2024-11-04 MED ORDER — RIZATRIPTAN BENZOATE 10 MG PO TBDP: 10.0000 mg | ORAL_TABLET | ORAL | 11 refills | Status: AC | PRN

## 2024-11-04 MED ORDER — PROMETHAZINE HCL 25 MG PO TABS: 25.0000 mg | ORAL_TABLET | Freq: Three times a day (TID) | ORAL | 3 refills | Status: AC | PRN

## 2024-11-04 MED ORDER — QULIPTA 60 MG PO TABS: 60.0000 mg | ORAL_TABLET | Freq: Every day | ORAL | 11 refills | Status: AC

## 2024-11-04 MED ORDER — MECLIZINE HCL 25 MG PO TABS: 25.0000 mg | ORAL_TABLET | Freq: Two times a day (BID) | ORAL | 3 refills | Status: AC | PRN

## 2024-11-04 MED ORDER — DICLOFENAC POTASSIUM 50 MG PO TABS: 50.0000 mg | ORAL_TABLET | Freq: Two times a day (BID) | ORAL | 5 refills | Status: AC | PRN

## 2024-11-04 NOTE — Telephone Encounter (Signed)
 Pharmacy Patient Advocate Encounter   Received notification from Fax that prior authorization for Qulipta  is required/requested.   Insurance verification completed.   The patient is insured through [facility].   Per test claim: PA required; PA started via CoverMyMeds. KEY [id number] . Waiting for clinical questions to populate.

## 2024-11-04 NOTE — Progress Notes (Signed)
 "  Montoya S Jeffcoat  CC:  headaches PRIMARY NEUROLOGIST: Penumalli  Follow-up Visit  Update 11/04/24 SS: Has not been getting Qulipta  since this summer, didn't realize it wasn't on auto refill. When on Qulipta , could tell improvement in migraines! Lately every other day migraine, takes ibuprofen  without benefit. Depressed, a lot of stress, everyday is a bad day. Tearful today. Unclear if she has tried triptan. Meclizine  PRN for dizziness, phenergan  PRN.  Update Apr 03, 2024 SS: Last visit, we ordered Vyepti  but was too expensive. About every other day migraine headache, last 20 minutes. Frontal area, top of her head, throbbing, migraine features, nauseated.Will take diclofenac , phenergan  PRN for nausea, takes meclizine  every other day. Depressed, going to see psychiatrist today.  She is disheveled, anxious.  August 20, 2023 SS: Has been out of propranolol  80 mg daily for months, not much benefit.  3-4 migraines weekly. Throbbing, sharp pain frontal. Migraine features, gets very nauseated. Takes meclizine  as needed for dizziness, may take up to twice a day. Takes phenergan  for migraine.  Diclofenac  was helpful.  Brief HPI: 53 year old female with a history of pineal cyst, kidney stones, depression, anxiety, ADHD who follows in clinic for migraines. 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  At her last visit, propranolol  was increased to 60 mg daily. Diclofenac  was started for rescue.  Interval History: 08/09/22 [place]: Headache frequency has improved slightly, but she continues to have 2 migraines per week while on propranolol . No side effect with it. Diclofenac  helps take the edge off of her migraines but does not resolve them. Phenergan  helps with nausea.  Headache days per month: 8 Headache free days per month: 22  Current Headache Regimen: Preventative: propranolol  60 mg daily Abortive: diclofenac  50-100 mg PRN, phenergan  25 mg PRN  Prior Therapies                                   Effexor  150 mg daily Fluoxetine  40 mg daily Propranolol  60 mg daily Nurtec every other day Topamax  contraindicated due to kidney stones Toradol  Diclofenac  Robaxin   Qulipta  (denied) Vyepti  (expensive)  Physical Exam:   Vital Signs: LMP  (LMP Unknown)   Physical Exam  General: The patient is alert and cooperative at the time of the examination.  Anxious, tearful  Skin: No significant peripheral edema is noted.  Neurologic Exam  Mental status: The patient is alert and oriented x 3 at the time of the examination. The patient has apparent normal recent and remote memory, with an apparently normal attention span and concentration ability.  Cranial nerves: Facial symmetry is present. Speech is normal, no aphasia or dysarthria is noted. Extraocular movements are full. Visual fields are full.  Motor: The patient has good strength in all 4 extremities.  Sensory examination: Soft touch sensation is symmetric on the face, arms, and legs.  Coordination: The patient has good finger-nose-finger and heel-to-shin bilaterally.  Gait and station: The patient has a normal gait.   IMPRESSION: 53 year old female with a history of pineal cyst, kidney stones, depression, anxiety, ADHD who presents for follow up of migraines.  PLAN: - Restart Qulipta  60 mg daily for migraine prevention - Try Maxalt 10 mg as needed for acute migraine vs overusing OTC meds - Can continue diclofenac  50 mg as needed for severe migraine, may combine with Phenergan  25 mg, meclizine  25 mg as needed for prolonged nausea or  dizziness with migraine. Limit treating headache to no more than 2-3 days a week to prevent rebound. Limit diclofenac  use due to possible GI side effect.  - Next steps: Botox, possibly restart propranolol  but would need to evaluate BP, CGRP injection is not an option due to needle phobia, reportedly no one to give injection. Vyepti  was too expensive. Consider Imitrex, Nurtec, Ubrelvy for acute  migraine.  - Follow-up in 6 months   Lauraine Gayland MANDES, DNP  [facility] [address] [place], [place] [address] [phone]   "

## 2024-11-04 NOTE — Patient Instructions (Signed)
 Restart Qulipta  60 mg daily for migraine preventin  Try maxalt 10 mg melt tablet at onset of migraine.  Can use Diclofenac  as alternative, limit use, GI side effect

## 2024-11-05 ENCOUNTER — Other Ambulatory Visit (HOSPITAL_COMMUNITY): Payer: Self-pay

## 2024-11-05 NOTE — Telephone Encounter (Signed)
 Pharmacy Patient Advocate Encounter  Received notification from [facility] that Prior Authorization for Qulipta  has been APPROVED from 11/05/2024 to 02/03/2025. Unable to obtain price due to refill too soon rejection, last fill date 11/05/2024 next available fill date1/23/2026   PA #/Case ID/Reference #: [id number]

## 2024-11-05 NOTE — Telephone Encounter (Signed)
 Clinical questions have been answered and PA submitted. PA currently Pending. Please be advised that most companies allow up to 30 days to make a decision. We will advise when a determination has been made, or follow up in 1 week.   Please reach out to our team, Rx Prior Auth Pool, if you haven't heard back in a week.

## 2024-11-09 NOTE — Telephone Encounter (Signed)
 Please see document attached and inform the patient results. No stimulants prescription.

## 2024-11-17 ENCOUNTER — Encounter: Payer: Self-pay | Admitting: Medical

## 2024-11-17 ENCOUNTER — Ambulatory Visit: Admitting: Medical

## 2024-11-17 VITALS — BP 128/70 | HR 94 | Temp 100.5°F | Ht 67.0 in | Wt 190.2 lb

## 2024-11-17 DIAGNOSIS — R051 Acute cough: Secondary | ICD-10-CM | POA: Diagnosis not present

## 2024-11-17 DIAGNOSIS — J101 Influenza due to other identified influenza virus with other respiratory manifestations: Secondary | ICD-10-CM

## 2024-11-17 DIAGNOSIS — J029 Acute pharyngitis, unspecified: Secondary | ICD-10-CM

## 2024-11-17 DIAGNOSIS — R6883 Chills (without fever): Secondary | ICD-10-CM | POA: Diagnosis not present

## 2024-11-17 LAB — POCT RAPID STREP A (OFFICE): Rapid Strep A Screen: NEGATIVE

## 2024-11-17 LAB — POC COVID19/FLU A&B COMBO
Covid Antigen, POC: NEGATIVE
Influenza A Antigen, POC: POSITIVE — AB
Influenza B Antigen, POC: NEGATIVE

## 2024-11-17 MED ORDER — LIDOCAINE VISCOUS HCL 2 % MT SOLN: OROMUCOSAL | 0 refills | Status: AC

## 2024-11-17 MED ORDER — OSELTAMIVIR PHOSPHATE 75 MG PO CAPS: 75.0000 mg | ORAL_CAPSULE | Freq: Two times a day (BID) | ORAL | 0 refills | Status: DC

## 2024-11-17 NOTE — Patient Instructions (Signed)
 Influenza infection Confirmed influenza with positive flu test.  - Prescribed Tamiflu  twice daily for five days to help moderate symptoms. - Advised mask use and avoiding others for three to four days. - Instructed to seek care if experiencing difficulty breathing, dehydration, or worsening symptoms.  Acute pharyngitis Severe sore throat with no significant throat abnormalities. - Increase water intake to seven to eight glasses daily. - Advised salt water gargles. - Prescribed lidocaine  viscous solution, two teaspoons every four to six hours as needed. - Recommended over-the-counter chloraseptic spray. - Advised Tylenol  500 mg twice daily for fever and malaise.
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
## 2024-11-20 ENCOUNTER — Other Ambulatory Visit (HOSPITAL_COMMUNITY): Payer: Self-pay | Admitting: Psychiatry

## 2024-11-20 DIAGNOSIS — F4310 Post-traumatic stress disorder, unspecified: Secondary | ICD-10-CM

## 2024-11-20 DIAGNOSIS — F4010 Social phobia, unspecified: Secondary | ICD-10-CM

## 2024-11-25 ENCOUNTER — Ambulatory Visit: Admitting: Medical

## 2024-11-26 ENCOUNTER — Ambulatory Visit: Admitting: Medical

## 2024-11-26 ENCOUNTER — Encounter: Payer: Self-pay | Admitting: Medical

## 2024-11-26 VITALS — BP 112/70 | HR 83 | Ht 67.0 in | Wt 196.4 lb

## 2024-11-26 DIAGNOSIS — Z0000 Encounter for general adult medical examination without abnormal findings: Secondary | ICD-10-CM

## 2024-11-26 DIAGNOSIS — I700 Atherosclerosis of aorta: Secondary | ICD-10-CM

## 2024-11-26 DIAGNOSIS — Z131 Encounter for screening for diabetes mellitus: Secondary | ICD-10-CM

## 2024-11-26 DIAGNOSIS — E559 Vitamin D deficiency, unspecified: Secondary | ICD-10-CM | POA: Diagnosis not present

## 2024-11-26 DIAGNOSIS — F4010 Social phobia, unspecified: Secondary | ICD-10-CM | POA: Diagnosis not present

## 2024-11-26 DIAGNOSIS — K5909 Other constipation: Secondary | ICD-10-CM

## 2024-11-26 DIAGNOSIS — G4700 Insomnia, unspecified: Secondary | ICD-10-CM | POA: Diagnosis not present

## 2024-11-26 DIAGNOSIS — Z113 Encounter for screening for infections with a predominantly sexual mode of transmission: Secondary | ICD-10-CM

## 2024-11-26 DIAGNOSIS — Z1389 Encounter for screening for other disorder: Secondary | ICD-10-CM

## 2024-11-26 DIAGNOSIS — E785 Hyperlipidemia, unspecified: Secondary | ICD-10-CM

## 2024-11-26 DIAGNOSIS — G43809 Other migraine, not intractable, without status migrainosus: Secondary | ICD-10-CM

## 2024-11-26 DIAGNOSIS — R0600 Dyspnea, unspecified: Secondary | ICD-10-CM | POA: Diagnosis not present

## 2024-11-26 DIAGNOSIS — Z79899 Other long term (current) drug therapy: Secondary | ICD-10-CM | POA: Diagnosis not present

## 2024-11-26 DIAGNOSIS — R9389 Abnormal findings on diagnostic imaging of other specified body structures: Secondary | ICD-10-CM

## 2024-11-26 DIAGNOSIS — F339 Major depressive disorder, recurrent, unspecified: Secondary | ICD-10-CM

## 2024-11-26 DIAGNOSIS — R942 Abnormal results of pulmonary function studies: Secondary | ICD-10-CM

## 2024-11-26 DIAGNOSIS — Z1211 Encounter for screening for malignant neoplasm of colon: Secondary | ICD-10-CM

## 2024-11-26 DIAGNOSIS — Z7185 Encounter for immunization safety counseling: Secondary | ICD-10-CM
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
## 2024-11-26 NOTE — Addendum Note (Signed)
 Addended by: VICCI HUSBAND A on: 11/26/2024 11:26 AM   Modules accepted: Orders

## 2024-11-27 ENCOUNTER — Telehealth (HOSPITAL_COMMUNITY): Payer: Self-pay

## 2024-11-27 LAB — LIPID PANEL

## 2024-11-27 NOTE — Telephone Encounter (Signed)
 Patient is calling for her refill on Adderall. She last got a refill on 12/24, patient is concerned that it is a Saturday, she would like to know if you can send a post dated script that she can pick up on the 24th. Please review and advise, thank you

## 2024-11-28 ENCOUNTER — Ambulatory Visit: Payer: Self-pay | Admitting: Medical

## 2024-11-28 ENCOUNTER — Telehealth (HOSPITAL_COMMUNITY): Payer: Self-pay | Admitting: *Deleted

## 2024-11-28 ENCOUNTER — Other Ambulatory Visit: Payer: Self-pay | Admitting: Medical

## 2024-11-28 LAB — LIPID PANEL
Cholesterol, Total: 142 mg/dL (ref 100–199)
HDL: 48 mg/dL
LDL CALC COMMENT:: 3 ratio (ref 0.0–4.4)
LDL Chol Calc (NIH): 70 mg/dL (ref 0–99)
Triglycerides: 139 mg/dL (ref 0–149)
VLDL Cholesterol Cal: 24 mg/dL (ref 5–40)

## 2024-11-28 LAB — COMPREHENSIVE METABOLIC PANEL WITH GFR
ALT: 13 IU/L (ref 0–32)
AST: 13 IU/L (ref 0–40)
Albumin: 3.7 g/dL — ABNORMAL LOW (ref 3.8–4.9)
Alkaline Phosphatase: 72 IU/L (ref 49–135)
BUN/Creatinine Ratio: 16 (ref 9–23)
BUN: 13 mg/dL (ref 6–24)
Bilirubin Total: 0.2 mg/dL (ref 0.0–1.2)
CO2: 17 mmol/L — ABNORMAL LOW (ref 20–29)
Calcium: 9 mg/dL (ref 8.7–10.2)
Chloride: 111 mmol/L — ABNORMAL HIGH (ref 96–106)
Creatinine, Ser: 0.8 mg/dL (ref 0.57–1.00)
Globulin, Total: 1.7 g/dL (ref 1.5–4.5)
Glucose: 102 mg/dL — ABNORMAL HIGH (ref 70–99)
Potassium: 3.9 mmol/L (ref 3.5–5.2)
Sodium: 140 mmol/L (ref 134–144)
Total Protein: 5.4 g/dL — ABNORMAL LOW (ref 6.0–8.5)
eGFR: 88 mL/min/1.73

## 2024-11-28 LAB — CBC
Hematocrit: 45.6 % (ref 34.0–46.6)
Hemoglobin: 14.5 g/dL (ref 11.1–15.9)
MCH: 29.7 pg (ref 26.6–33.0)
MCHC: 31.8 g/dL (ref 31.5–35.7)
MCV: 93 fL (ref 79–97)
Platelets: 350 x10E3/uL (ref 150–450)
RBC: 4.88 x10E6/uL (ref 3.77–5.28)
RDW: 12.4 % (ref 11.7–15.4)
WBC: 9.1 x10E3/uL (ref 3.4–10.8)

## 2024-11-28 LAB — CHLAMYDIA/GONOCOCCUS/TRICHOMONAS, NAA
Chlamydia by NAA: NEGATIVE
Gonococcus by NAA: NEGATIVE
Trich vag by NAA: NEGATIVE

## 2024-11-28 LAB — TSH: TSH: 0.833 u[IU]/mL (ref 0.450–4.500)

## 2024-11-28 LAB — HEMOGLOBIN A1C
Est. average glucose Bld gHb Est-mCnc: 111 mg/dL
Hgb A1c MFr Bld: 5.5 % (ref 4.8–5.6)

## 2024-11-28 LAB — URINALYSIS, ROUTINE W REFLEX MICROSCOPIC

## 2024-11-28 LAB — HEPATITIS B SURFACE ANTIGEN

## 2024-11-28 LAB — HIV ANTIBODY (ROUTINE TESTING W REFLEX): HIV Screen 4th Generation wRfx: NONREACTIVE

## 2024-11-28 LAB — SYPHILIS: RPR W/REFLEX TO RPR TITER AND TREPONEMAL ANTIBODIES, TRADITIONAL SCREENING AND DIAGNOSIS ALGORITHM: RPR Ser Ql: NONREACTIVE

## 2024-11-28 MED ORDER — LINACLOTIDE 290 MCG PO CAPS: 290.0000 ug | ORAL_CAPSULE | Freq: Every day | ORAL | 3 refills | Status: AC

## 2024-11-28 NOTE — Progress Notes (Signed)
 Results through MyChart

## 2024-11-28 NOTE — Telephone Encounter (Signed)
 Look at the UTOX results from December 22.  Her UTOX is positive for metabolites of cocaine.  I told Rebecca Andrade we will not prescribe stimulant anymore.  I have the agreement with the patient that if she used the drugs we will not prescribe stimulants.

## 2024-11-28 NOTE — Telephone Encounter (Signed)
 Correct. I will advise pt.

## 2024-11-28 NOTE — Telephone Encounter (Signed)
 Writer spoke with pt regarding her request for Adderall 10 mg. Writer advised pt that UTOX was positive for cocaine and therefore no prescription for stimulant will be prescribed. Pt denies any drug use and was adament test was nedative for anything other than prescribed medication. Pt was advised that this nurse read the test herself and it was positive for cocaine and [facility] has verified this. Pt denies. I advised [name] to come in next week for repeat UTOX. Pt verbalized understanding.

## 2024-12-05 ENCOUNTER — Telehealth (HOSPITAL_COMMUNITY): Payer: Self-pay

## 2024-12-05 ENCOUNTER — Ambulatory Visit (HOSPITAL_COMMUNITY)

## 2024-12-05 DIAGNOSIS — F900 Attention-deficit hyperactivity disorder, predominantly inattentive type: Secondary | ICD-10-CM

## 2024-12-05 MED ORDER — AMPHETAMINE-DEXTROAMPHETAMINE 10 MG PO TABS: 10.0000 mg | ORAL_TABLET | Freq: Two times a day (BID) | ORAL | 0 refills | Status: AC

## 2024-12-05 NOTE — Telephone Encounter (Signed)
 Patient called back and I advised her that her prescription was at the pharmacy. I also informed her about future UDS

## 2024-12-05 NOTE — Telephone Encounter (Signed)
 I sent the prescription to her pharmacy.  Please inform that we will continue to do urine toxicology in the future.  Thank you

## 2024-12-05 NOTE — Telephone Encounter (Signed)
 Patient came by for her UDS today and it came back negative for all substances, patient would like a refill on her Adderall, please advise, thank you

## 2024-12-18 ENCOUNTER — Encounter: Admitting: Nurse Practitioner

## 2024-12-25 ENCOUNTER — Ambulatory Visit (HOSPITAL_COMMUNITY): Admitting: Psychiatry

## 2025-06-08 ENCOUNTER — Ambulatory Visit: Admitting: Neurology

## 2025-06-11 ENCOUNTER — Ambulatory Visit: Admitting: Neurology

## 2025-09-15 ENCOUNTER — Ambulatory Visit

## 2025-12-01 ENCOUNTER — Encounter: Admitting: Medical
